# Patient Record
Sex: Male | Born: 1950 | Race: White | Hispanic: No | Marital: Married | Smoking: Former smoker
Health system: Southern US, Community
[De-identification: ages and names within clinical notes are randomized; demographics above are authoritative.]

## PROBLEM LIST (undated history)

## (undated) DIAGNOSIS — I2510 Atherosclerotic heart disease of native coronary artery without angina pectoris: Secondary | ICD-10-CM

## (undated) DIAGNOSIS — E119 Type 2 diabetes mellitus without complications: Secondary | ICD-10-CM

## (undated) DIAGNOSIS — I10 Essential (primary) hypertension: Secondary | ICD-10-CM

## (undated) DIAGNOSIS — E7800 Pure hypercholesterolemia, unspecified: Secondary | ICD-10-CM

## (undated) DIAGNOSIS — K219 Gastro-esophageal reflux disease without esophagitis: Secondary | ICD-10-CM

## (undated) DIAGNOSIS — R091 Pleurisy: Secondary | ICD-10-CM

## (undated) DIAGNOSIS — G4730 Sleep apnea, unspecified: Secondary | ICD-10-CM

## (undated) DIAGNOSIS — J40 Bronchitis, not specified as acute or chronic: Secondary | ICD-10-CM

## (undated) DIAGNOSIS — J189 Pneumonia, unspecified organism: Secondary | ICD-10-CM

## (undated) HISTORY — DX: Atherosclerotic heart disease of native coronary artery without angina pectoris: I25.10

---

## 2002-09-10 ENCOUNTER — Encounter: Payer: Self-pay | Admitting: Family Medicine

## 2002-09-10 ENCOUNTER — Ambulatory Visit (HOSPITAL_COMMUNITY): Admission: RE | Admit: 2002-09-10 | Discharge: 2002-09-10 | Payer: Self-pay | Admitting: Family Medicine

## 2003-06-20 ENCOUNTER — Encounter: Payer: Self-pay | Admitting: Internal Medicine

## 2003-06-20 ENCOUNTER — Inpatient Hospital Stay (HOSPITAL_COMMUNITY): Admission: AD | Admit: 2003-06-20 | Discharge: 2003-06-21 | Payer: Self-pay | Admitting: Cardiology

## 2003-06-21 ENCOUNTER — Encounter: Payer: Self-pay | Admitting: Cardiology

## 2005-01-25 ENCOUNTER — Encounter (HOSPITAL_COMMUNITY): Admission: RE | Admit: 2005-01-25 | Discharge: 2005-02-24 | Payer: Self-pay | Admitting: Preventative Medicine

## 2008-01-08 ENCOUNTER — Emergency Department (HOSPITAL_COMMUNITY): Admission: EM | Admit: 2008-01-08 | Discharge: 2008-01-08 | Payer: Self-pay | Admitting: Emergency Medicine

## 2008-10-05 ENCOUNTER — Ambulatory Visit (HOSPITAL_COMMUNITY): Admission: RE | Admit: 2008-10-05 | Discharge: 2008-10-05 | Payer: Self-pay | Admitting: Gastroenterology

## 2008-10-05 ENCOUNTER — Encounter: Payer: Self-pay | Admitting: Gastroenterology

## 2008-10-05 ENCOUNTER — Ambulatory Visit: Payer: Self-pay | Admitting: Gastroenterology

## 2008-10-07 ENCOUNTER — Encounter: Payer: Self-pay | Admitting: Gastroenterology

## 2011-01-15 NOTE — Op Note (Signed)
Douglas Mason, Douglas Mason             ACCOUNT NO.:  [id number]   MEDICAL RECORD NO.:  [id number]          PATIENT TYPE:  AMB   LOCATION:  DAY                           FACILITY:  [facility]   PHYSICIAN:  Kassie Mends, M.D.      DATE OF BIRTH:  [birth year]   DATE OF PROCEDURE:  10/05/2008  DATE OF DISCHARGE:                                PROCEDURE NOTE   REFERRING [name]:  Dr. Catalina Pizza   PROCEDURE:  Colonoscopy.   INDICATION FOR EXAM:  Douglas Mason is a 60 year old male who presents  for average risk colon cancer screening.   FINDINGS:  1. Slightly tortuous colon requiring pressure to intubate the cecum.      Otherwise, no masses, inflammatory changes, or AVMs seen.  2. Rare sigmoid colon diverticula.  3. Flat adenomatous appearing lesion in the sigmoid colon      approximately 35 cm from the anal verge which was removed via cold      forceps.  A 3-mm sessile rectal polyp removed via cold forceps.  4. Moderate internal hemorrhoids.   RECOMMENDATIONS:  1. Will call Douglas Mason with results of his biopsies.  2. Screening colonoscopy in 5 years if he has flat lesion is an      adenomatous polyp.  3. He should follow high fiber diet.  He is given a handout on high-      fiber diet, polyps, diverticulosis, and hemorrhoids.  4. Aspirin, NSAIDs, or anticoagulation for 5 days.   MEDICATIONS:  1. Demerol 75 mg IV.  2. Versed 4 mg IV.   PROCEDURE TECHNIQUE:  Physical exam was performed.  Informed consent was  obtained from the patient after explaining benefits, risks, and  alternatives to the procedure.  The patient was connected to the monitor  and placed in left lateral position.  Continuous oxygen was provided by  nasal cannula and IV medicine administered through an indwelling  cannula.  After administration of sedation and rectal exam, the  patient's rectum was intubated and the scope was advanced under direct  visualization to the cecum.  The scope was removed slowly by carefully  examining  the color, texture, anatomy, and integrity of the mucosa on the way out.  The patient was recovered in endoscopy and discharged home in  satisfactory condition.   PATH:  Hyperplastic polyp. TCS in 10 years. High fiber diet.      Kassie Mends, M.D.  Electronically Signed     SM/MEDQ  D:  10/05/2008  T:  10/06/2008  Job:  [id number]   cc:   Catalina Pizza, M.D.  Fax: [phone]

## 2011-01-18 NOTE — Procedures (Signed)
   NAME:  Douglas Mason, Douglas Mason                       ACCOUNT NO.:  [id number]   MEDICAL RECORD NO.:  [id number]                   PATIENT TYPE:  OUT   LOCATION:  DFTL                                 FACILITY:  [facility]   PHYSICIAN:  Edward L. Juanetta Gosling, M.D.             DATE OF BIRTH:  Apr 08, 1951   DATE OF PROCEDURE:  DATE OF DISCHARGE:  09/10/2002                              PULMONARY FUNCTION TEST   1. Spirometry is normal.  2. Lung volumes are normal.  3. DLCO is normal.                                                Edward L. Juanetta Gosling, M.D.    ELH/MEDQ  D:  09/13/2002  T:  09/14/2002  Job:  [id number]   cc:   Donna Bernard, M.D.  [address]  [place]  [place] [address]  Fax: [phone]

## 2011-01-18 NOTE — Discharge Summary (Signed)
NAME:  Douglas Mason, Douglas Mason                       ACCOUNT NO.:  [id number]   MEDICAL RECORD NO.:  [id number]                   PATIENT TYPE:  INP   LOCATION:  2035                                 FACILITY:  [facility]   PHYSICIAN:  Willa Rough, M.D.                  DATE OF BIRTH:  1951-05-22   DATE OF ADMISSION:  06/20/2003  DATE OF DISCHARGE:  06/21/2003                           DISCHARGE SUMMARY - REFERRING   DISCHARGE DIAGNOSIS:  Atypical chest pain.  CT negative for pulmonary  embolus. Cardiac enzymes negative.  Echocardiogram normal.   SECONDARY DIAGNOSES:  1. Finding of dyslipidemia this admission on fasting lipid profile.  No past     surgical history.  2. Family history of coronary artery disease.   PROCEDURES:  A 2-D echocardiogram June 21, 2003.  This study showed that  the left ventricular systolic function at the lower limits of normal with  ejection fraction estimated 50 to 55%.  No left ventricular regional wall  motion abnormalities.  Left ventricular wall thickness mildly increased.  Left atrium mildly dilated.  Right ventricular size upper limits of normal.  Aortic valve was trileaflet with no evidence of aortic valve stenosis, no  significant aortic regurgitation, trivial mitral valve regurgitation.  No  pericardial effusion.   DISPOSITION:  Douglas Mason is ready for discharge October 19.  He is  not having any chest pain at the current time.  He has had no chest pain  since his admission, but it was significant prior to admission.  He has been  in sinus rhythm this hospitalization with no evidence of dysrhythmia.  His  blood pressure and heart rate have been stable.  His mental status has been  clear.  Cardiac enzymes are negative as mentioned above.  Computed tomogram  of the chest was negative for pulmonary embolism.  He goes home on the  following medications.   DISCHARGE MEDICATIONS:  1. Enteric-coated aspirin 325 mg daily.  2. Continue  multivitamins and Sinutab.  3. He was given a prescription for Percocet 5/325 one to two tablets every 4     to 6 hours as needed for pain.  4. Nitroglycerin 0.4 mg 1 tablet under the tongue every 5 minutes x 3 doses     as needed for chest pain.   ACTIVITY:  He is to return to work Monday, October 25.  He has been given an  excuse pass to cover him until them.   FOLLOW UP:  He is to follow up with Dr. Gerda Diss should his chest pain recur  or persist.   BRIEF HISTORY:  Douglas Mason is 60 year old male with no prior history of  coronary artery disease.  At 1:30 on the morning of October 18, he awoke  with severe left-sided chest pain.  It was worse with position change,  slightly worse on deep inspiration.  He went to [facility], was  treated with nitroglycerin and morphine with some improvement in  symptoms.  Initial cardiac enzymes at [facility] were negative.  Transferred  to [facility] for further evaluation.   Plan is to complete cardiac enzymes, CT scan to rule out pulmonary embolism,  and echocardiogram to evaluate right ventricular size and follow up on  serial cardiac enzymes.  Enzymes at [facility] x 2 October 18 at  1320 hours: CK 63, CK-MB 1.0, troponin less than 0.01.  The enzymes October  18 at 2055 hours: CK 62, CK-MB 0.9.  Serum electrolytes on October 19:  Sodium 138, potassium 4.2, chloride 105, bicarbonate 26, BUN 11,  creatinine 0.8 glucose 106.  Admission fasting lipid profile: Cholesterol  220, triglycerides 269, HDL cholesterol 32, LDL cholesterol 134.  Once  again, it is suggested that his abnormal lipid profile be treated with  statins or with Niaspan.      Maple Mirza, P.A.                    Willa Rough, M.D.    GM/MEDQ  D:  06/21/2003  T:  06/21/2003  Job:  [id number]   cc:   Dr. Wende Crease [phone])   Donna Bernard, M.D.  [address]  [place]  [place] [address]  Fax: [phone]   Scott A. Gerda Diss,  M.D.  [address]  [place]  [place] [address]  Fax: [phone]

## 2011-01-18 NOTE — H&P (Signed)
NAME:  Douglas Mason, Douglas Mason                      ACCOUNT NO.:  [id number]   MEDICAL RECORD NO.:  [id number]                   PATIENT TYPE:  INP   LOCATION:  2035                                 FACILITY:  [facility]   PHYSICIAN:  Charlies Constable, M.D.                  DATE OF BIRTH:  04/07/1951   DATE OF ADMISSION:  06/20/2003  DATE OF DISCHARGE:                                HISTORY & PHYSICAL   PRIMARY CARE PHYSICIAN:  Scott A. Gerda Diss, M.D.   CHIEF COMPLAINT:  Chest pain.   HISTORY OF PRESENT ILLNESS:  [name] is 60 years old, has no prior  history of known heart disease.  At 1:30 a.m. he woke up with severe left-  sided chest pain which was worse with position and slightly worse with deep  inspiration.  He went to [facility] emergency room and was treated  with nitroglycerin and morphine with some improvement in his symptoms.  He  had initial enzymes there which were negative and he was transferred to [place]  for further evaluation.   PAST MEDICAL HISTORY:  History of shortness of breath, evaluated with  pulmonary function tests, which were normal.  He has no history of diabetes,  hypertension, or hyperlipidemia.  He has no history of prior surgery.   MEDICATIONS PRIOR TO ADMISSION:  Only multivitamins and Sinutabs.   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  He is on his feet most of the day working.   REVIEW OF SYMPTOMS:  This is significant in that there is no history of  recent inactivity or long trips that would predispose to pulmonary embolism.  He has no prior history of thrombophlebitis.   FAMILY HISTORY:  This is positive in that his father died at 19 of cerebral  aneurysm and his mother had a stroke but died at 35 of leukemia.  He does  have a grandfather and grandmother who had history of MI in their 60s and  26s.   For details of social history, family history and review of systems, please  see  Timmie Foerster complete note.   PHYSICAL EXAMINATION:  VITAL SIGNS:  Blood pressure 110/70, pulse 56 and  regular, temperature 97.9.  NECK:  The venous pulsation was visible 1 cm above the clavicle.  Carotid  pulses were full without bruits.  Thyroid not enlarged.  CHEST:  There were questionable minimal rales right over the left lower lung  field at the point of pain.  The lungs were otherwise clear.  There were  otherwise no rales or rhonchi.  CARDIOVASCULAR:  There was a regular rhythm.  The first and second heart  sounds were normal.  There were no murmurs or gallops.  ABDOMEN:  Soft without organomegaly.  The bowel sounds were normal.  EXTREMITIES:  Good pulses.  No peripheral edema and no stasis changes  and no  evidence of phlebitis.  MUSCULOSKELETAL:  No deformities.  NEUROLOGIC:  No focal signs.  SKIN:  Warm and dry.   An ECG showed minor nonspecific STT changes.   Portable chest x-ray showed a small amount of fluid scarring in the fissure  in the right middle lung field.  Lungs were otherwise clear.   CBC was normal.  BMP was normal.   IMPRESSION:  Left-sided chest pain, doubt cardiac.  Rule out pulmonary  embolism.  Rule out pulmonary infection.  Rule out musculoskeletal pain.   RECOMMENDATIONS:  The patient's symptoms do not sound cardiac and suspect  they are either pulmonary or musculoskeletal.  Will obtain a spiral CT scan  to rule out pulmonary embolism and echocardiogram  to evaluate the elevated  neck veins and the right ventricular size and serial enzymes to rule out  cardiac etiology.  Will also get cervical spine films throughout nerve root  pain.                                                Charlies Constable, M.D.    BB/MEDQ  D:  06/20/2003  T:  06/20/2003  Job:  [id number]   cc:   Lorin Picket A. Gerda Diss, M.D.  [address], [address]  [place]  [place] [address]  Fax: [phone]

## 2014-01-27 ENCOUNTER — Encounter (HOSPITAL_COMMUNITY): Payer: Self-pay | Admitting: Emergency Medicine

## 2014-01-27 ENCOUNTER — Emergency Department (HOSPITAL_COMMUNITY): Payer: BC Managed Care – PPO

## 2014-01-27 ENCOUNTER — Emergency Department (HOSPITAL_COMMUNITY)
Admission: EM | Admit: 2014-01-27 | Discharge: 2014-01-27 | Disposition: A | Discharge status: Home / Self Care | Payer: BC Managed Care – PPO | Attending: Emergency Medicine | Admitting: Emergency Medicine

## 2014-01-27 DIAGNOSIS — Z87891 Personal history of nicotine dependence: Secondary | ICD-10-CM | POA: Insufficient documentation

## 2014-01-27 DIAGNOSIS — R059 Cough, unspecified: Secondary | ICD-10-CM | POA: Insufficient documentation

## 2014-01-27 DIAGNOSIS — R0789 Other chest pain: Secondary | ICD-10-CM

## 2014-01-27 DIAGNOSIS — R071 Chest pain on breathing: Secondary | ICD-10-CM | POA: Insufficient documentation

## 2014-01-27 DIAGNOSIS — R51 Headache: Secondary | ICD-10-CM | POA: Insufficient documentation

## 2014-01-27 DIAGNOSIS — E119 Type 2 diabetes mellitus without complications: Secondary | ICD-10-CM | POA: Insufficient documentation

## 2014-01-27 DIAGNOSIS — R05 Cough: Secondary | ICD-10-CM | POA: Insufficient documentation

## 2014-01-27 DIAGNOSIS — K219 Gastro-esophageal reflux disease without esophagitis: Secondary | ICD-10-CM | POA: Insufficient documentation

## 2014-01-27 DIAGNOSIS — R61 Generalized hyperhidrosis: Secondary | ICD-10-CM | POA: Insufficient documentation

## 2014-01-27 DIAGNOSIS — R0602 Shortness of breath: Secondary | ICD-10-CM | POA: Insufficient documentation

## 2014-01-27 DIAGNOSIS — I10 Essential (primary) hypertension: Secondary | ICD-10-CM | POA: Insufficient documentation

## 2014-01-27 HISTORY — DX: Essential (primary) hypertension: I10

## 2014-01-27 HISTORY — DX: Type 2 diabetes mellitus without complications: E11.9

## 2014-01-27 HISTORY — DX: Gastro-esophageal reflux disease without esophagitis: K21.9

## 2014-01-27 LAB — CBC WITH DIFFERENTIAL/PLATELET
BASOS PCT: 0 % (ref 0–1)
Basophils Absolute: 0 10*3/uL (ref 0.0–0.1)
EOS PCT: 1 % (ref 0–5)
Eosinophils Absolute: 0.1 10*3/uL (ref 0.0–0.7)
HCT: 37.4 % — ABNORMAL LOW (ref 39.0–52.0)
Hemoglobin: 12.3 g/dL — ABNORMAL LOW (ref 13.0–17.0)
LYMPHS ABS: 1.2 10*3/uL (ref 0.7–4.0)
Lymphocytes Relative: 11 % — ABNORMAL LOW (ref 12–46)
MCH: 30.3 pg (ref 26.0–34.0)
MCHC: 32.9 g/dL (ref 30.0–36.0)
MCV: 92.1 fL (ref 78.0–100.0)
Monocytes Absolute: 0.9 10*3/uL (ref 0.1–1.0)
Monocytes Relative: 8 % (ref 3–12)
Neutro Abs: 8.8 10*3/uL — ABNORMAL HIGH (ref 1.7–7.7)
Neutrophils Relative %: 80 % — ABNORMAL HIGH (ref 43–77)
PLATELETS: 401 10*3/uL — AB (ref 150–400)
RBC: 4.06 MIL/uL — AB (ref 4.22–5.81)
RDW: 14.8 % (ref 11.5–15.5)
WBC: 11 10*3/uL — ABNORMAL HIGH (ref 4.0–10.5)

## 2014-01-27 LAB — BASIC METABOLIC PANEL
BUN: 18 mg/dL (ref 6–23)
CALCIUM: 9.9 mg/dL (ref 8.4–10.5)
CO2: 27 mEq/L (ref 19–32)
Chloride: 100 mEq/L (ref 96–112)
Creatinine, Ser: 0.99 mg/dL (ref 0.50–1.35)
GFR, EST NON AFRICAN AMERICAN: 85 mL/min — AB (ref >90)
GLUCOSE: 116 mg/dL — AB (ref 70–99)
Potassium: 5.1 mEq/L (ref 3.7–5.3)
Sodium: 140 mEq/L (ref 137–147)

## 2014-01-27 LAB — D-DIMER, QUANTITATIVE: D-Dimer, Quant: 2.63 ug/mL-FEU — ABNORMAL HIGH (ref 0.00–0.48)

## 2014-01-27 LAB — TROPONIN I
Troponin I: <0.3 ng/mL (ref <0.30)
Troponin I: <0.3 ng/mL (ref <0.30)

## 2014-01-27 LAB — CBG MONITORING, ED: Glucose-Capillary: 101 mg/dL — ABNORMAL HIGH (ref 70–99)

## 2014-01-27 LAB — PRO B NATRIURETIC PEPTIDE: Pro B Natriuretic peptide (BNP): 61.5 pg/mL (ref 0–125)

## 2014-01-27 MED ORDER — TRAMADOL HCL 50 MG PO TABS: 50.0000 mg | ORAL_TABLET | Freq: Four times a day (QID) | ORAL | Status: DC | PRN

## 2014-01-27 MED ORDER — IOHEXOL 350 MG/ML SOLN
100.0000 mL | Freq: Once | INTRAVENOUS | Status: AC | PRN
  Administered 2014-01-27: 100 mL via INTRAVENOUS

## 2014-01-27 MED ORDER — HYDROCODONE-ACETAMINOPHEN 5-325 MG PO TABS: 1.0000 | ORAL_TABLET | Freq: Four times a day (QID) | ORAL | Status: DC | PRN

## 2014-01-27 MED ORDER — ONDANSETRON HCL 4 MG/2ML IJ SOLN
4.0000 mg | Freq: Once | INTRAMUSCULAR | Status: AC
  Administered 2014-01-27: 4 mg via INTRAVENOUS
  Filled 2014-01-27: qty 2

## 2014-01-27 MED ORDER — SODIUM CHLORIDE 0.9 % IV BOLUS (SEPSIS)
250.0000 mL | Freq: Once | INTRAVENOUS | Status: AC
  Administered 2014-01-27: 250 mL via INTRAVENOUS

## 2014-01-27 MED ORDER — NITROGLYCERIN 0.4 MG SL SUBL
0.4000 mg | SUBLINGUAL_TABLET | SUBLINGUAL | Status: DC | PRN
  Administered 2014-01-27: 0.4 mg via SUBLINGUAL
  Filled 2014-01-27: qty 1

## 2014-01-27 MED ORDER — ASPIRIN 81 MG PO CHEW
324.0000 mg | CHEWABLE_TABLET | Freq: Once | ORAL | Status: AC
  Administered 2014-01-27: 324 mg via ORAL
  Filled 2014-01-27: qty 4

## 2014-01-27 MED ORDER — MORPHINE SULFATE 2 MG/ML IJ SOLN
2.0000 mg | Freq: Once | INTRAMUSCULAR | Status: AC
  Administered 2014-01-27: 2 mg via INTRAVENOUS
  Filled 2014-01-27: qty 1

## 2014-01-27 MED ORDER — SODIUM CHLORIDE 0.9 % IV SOLN: INTRAVENOUS | Status: DC

## 2014-01-27 NOTE — ED Provider Notes (Signed)
CSN: [id number]     Arrival date & time 01/27/14  9371 History  This chart was scribed for Fredia Sorrow, MD by Zettie Pho, ED Scribe. This patient was seen in room APA01/APA01 and the patient's care was started at 8:54 AM.    Chief Complaint  Patient presents with  . Chest Pain   Patient is a 63 y.o. male presenting with chest pain. The history is provided by the patient. No language interpreter was used.  Chest Pain Pain location:  L chest Pain quality: sharp   Pain radiates to:  L shoulder and upper back Pain radiates to the back: yes   Pain severity:  Severe Onset quality:  Sudden Timing:  Constant Progression:  Unchanged Chronicity:  New Context: at rest   Relieved by:  Rest Worsened by:  Deep breathing and movement Associated symptoms: cough, diaphoresis, headache, lower extremity edema and shortness of breath   Associated symptoms: no abdominal pain, no back pain, no fever, no nausea and not vomiting   Cough:    Cough characteristics:  Dry   Severity:  Mild   Onset quality:  Gradual   Timing:  Constant   Progression:  Unchanged   Chronicity:  Chronic Headaches:    Severity:  Mild   Onset quality:  Gradual   Timing:  Constant   Progression:  Unchanged   Chronicity:  New Shortness of breath:    Severity:  Moderate   Onset quality:  Sudden   Timing:  Constant   Progression:  Waxing and waning Risk factors: diabetes mellitus, hypertension and male sex    HPI Comments: Douglas Mason is a 63 y.o. male with a history of HTN, GERD, and DM who presents to the Emergency Department complaining of a constant, sharp pain to the left side of the chest that radiates to the left posterior shoulder onset this morning around 2:00 AM, or about 7 hours ago. He states that the pain is alleviated to a 3/10 with rest, but exacerbated to an 8/10 with movement and deep breathing. He reports associated shortness of breath, diaphoresis, slightly increased leg swelling, and a generalized  headache. Patient states that these types of symptoms are new for him. Patient reports a dry cough that he states is normal for him and denies any recent changes. Patient takes 81 mg aspirin daily, but denies other anticoagulant medication use. He reports that he does not currently have a cardiologist. He denies nausea, vomiting, fever, chills, visual disturbance, rhinorrhea, sore throat, abdominal pain, diarrhea, dysuria.   Past Medical History  Diagnosis Date  . Hypertension   . Diabetes mellitus without complication   . GERD (gastroesophageal reflux disease)    History reviewed. No pertinent past surgical history. No family history on file. History  Substance Use Topics  . Smoking status: Former Research scientist (life sciences)  . Smokeless tobacco: Not on file  . Alcohol Use: No    Review of Systems  Constitutional: Positive for diaphoresis. Negative for fever and chills.  HENT: Negative for rhinorrhea and sore throat.   Eyes: Negative for visual disturbance.  Respiratory: Positive for cough and shortness of breath.   Cardiovascular: Positive for chest pain.  Gastrointestinal: Negative for nausea, vomiting, abdominal pain and diarrhea.  Genitourinary: Negative for dysuria and hematuria.  Musculoskeletal: Negative for back pain and neck pain.  Skin: Negative for rash.  Neurological: Positive for headaches.  Hematological: Does not bruise/bleed easily.  Psychiatric/Behavioral: Negative for confusion.      Allergies  Review of patient's  allergies indicates no known allergies.  Home Medications   Prior to Admission medications   Not on File   Triage Vitals: BP 126/66  Pulse 67  Temp(Src) 98.2 F (36.8 C) (Oral)  Resp 20  Ht 6' (1.829 m)  Wt 230 lb (104.327 kg)  BMI 31.19 kg/m2  SpO2 100%  Physical Exam  Nursing note and vitals reviewed. Constitutional: He is oriented to person, place, and time. He appears well-developed and well-nourished. No distress.  HENT:  Head: Normocephalic and  atraumatic.  Eyes: Conjunctivae and EOM are normal. Pupils are equal, round, and reactive to light.  Neck: Normal range of motion. Neck supple.  Cardiovascular: Normal rate, regular rhythm and normal heart sounds.   Pulmonary/Chest: Effort normal and breath sounds normal. No respiratory distress.  Abdominal: Soft. Bowel sounds are normal. He exhibits no distension. There is no tenderness.  Musculoskeletal: Normal range of motion. He exhibits no edema.  Neurological: He is alert and oriented to person, place, and time. No cranial nerve deficit. He exhibits normal muscle tone. Coordination normal.  Skin: Skin is warm and dry.  Psychiatric: He has a normal mood and affect. His behavior is normal.    ED Course  Procedures (including critical care time)  DIAGNOSTIC STUDIES: Oxygen Saturation is 100% on room air, normal by my interpretation.    COORDINATION OF CARE: 9:01 AM- Ordered EKG, CBC, BMP, troponin, D-dimer, BNP, and a chest x-ray. Ordered IV fluids, Zofran, aspirin, morphine, and nitroglycerin to manage symptoms. Discussed treatment plan with patient at bedside and patient verbalized agreement.   Medications  0.9 %  sodium chloride infusion (not administered)  nitroGLYCERIN (NITROSTAT) SL tablet 0.4 mg (0.4 mg Sublingual Given 01/27/14 1011)  sodium chloride 0.9 % bolus 250 mL (0 mLs Intravenous Stopped 01/27/14 1011)  ondansetron (ZOFRAN) injection 4 mg (4 mg Intravenous Given 01/27/14 0947)  aspirin chewable tablet 324 mg (324 mg Oral Given 01/27/14 0945)  morphine 2 MG/ML injection 2 mg (2 mg Intravenous Given 01/27/14 0948)  iohexol (OMNIPAQUE) 350 MG/ML injection 100 mL (100 mLs Intravenous Contrast Given 01/27/14 1112)   Results for orders placed during the hospital encounter of 01/27/14  CBC WITH DIFFERENTIAL      Result Value Ref Range   WBC 11.0 (*) 4.0 - 10.5 K/uL   RBC 4.06 (*) 4.22 - 5.81 MIL/uL   Hemoglobin 12.3 (*) 13.0 - 17.0 g/dL   HCT 97.5 (*) 30.0 - 51.1 %   MCV  92.1  78.0 - 100.0 fL   MCH 30.3  26.0 - 34.0 pg   MCHC 32.9  30.0 - 36.0 g/dL   RDW 02.1  11.7 - 35.6 %   Platelets 401 (*) 150 - 400 K/uL   Neutrophils Relative % 80 (*) 43 - 77 %   Neutro Abs 8.8 (*) 1.7 - 7.7 K/uL   Lymphocytes Relative 11 (*) 12 - 46 %   Lymphs Abs 1.2  0.7 - 4.0 K/uL   Monocytes Relative 8  3 - 12 %   Monocytes Absolute 0.9  0.1 - 1.0 K/uL   Eosinophils Relative 1  0 - 5 %   Eosinophils Absolute 0.1  0.0 - 0.7 K/uL   Basophils Relative 0  0 - 1 %   Basophils Absolute 0.0  0.0 - 0.1 K/uL  BASIC METABOLIC PANEL      Result Value Ref Range   Sodium 140  137 - 147 mEq/L   Potassium 5.1  3.7 - 5.3 mEq/L  Chloride 100  96 - 112 mEq/L   CO2 27  19 - 32 mEq/L   Glucose, Bld 116 (*) 70 - 99 mg/dL   BUN 18  6 - 23 mg/dL   Creatinine, Ser 0.99  0.50 - 1.35 mg/dL   Calcium 9.9  8.4 - 10.5 mg/dL   GFR calc non Af Amer 85 (*) >90 mL/min   GFR calc Af Amer >90  >90 mL/min  TROPONIN I      Result Value Ref Range   Troponin I <0.30  <0.30 ng/mL  PRO B NATRIURETIC PEPTIDE      Result Value Ref Range   Pro B Natriuretic peptide (BNP) 61.5  0 - 125 pg/mL  D-DIMER, QUANTITATIVE      Result Value Ref Range   D-Dimer, Quant 2.63 (*) 0.00 - 0.48 ug/mL-FEU  TROPONIN I      Result Value Ref Range   Troponin I <0.30  <0.30 ng/mL  CBG MONITORING, ED      Result Value Ref Range   Glucose-Capillary 101 (*) 70 - 99 mg/dL   Dg Chest Port 1 View  01/27/2014   CLINICAL DATA:  Chest pain.  EXAM: PORTABLE CHEST - 1 VIEW  COMPARISON:  01/08/2008  FINDINGS: Lung volumes are low bilaterally with bibasilar atelectasis present. The heart size is at the upper limits of normal/mildly enlarged. No overt edema, airspace consolidation, pleural fluid or pneumothorax is identified.  IMPRESSION: Bibasilar atelectasis with low bilateral lung volumes.   Electronically Signed   By: Aletta Edouard M.D.   On: 01/27/2014 09:22   Ct Angio Chest Pe W/cm &/or Wo Cm  01/27/2014   CLINICAL DATA:   Left-sided chest pain  EXAM: CT ANGIOGRAPHY CHEST WITH CONTRAST  TECHNIQUE: Multidetector CT imaging of the chest was performed using the standard protocol during bolus administration of intravenous contrast. Multiplanar CT image reconstructions and MIPs were obtained to evaluate the vascular anatomy.  CONTRAST:  171mL OMNIPAQUE IOHEXOL 350 MG/ML SOLN  COMPARISON:  None.  FINDINGS: No filling defects within the pulmonary arteries to suggest acute pulmonary embolism. No acute findings of the aorta or great vessels. Trace pericardial effusion is noted. Coronary artery calcifications noted.  Review of the lung parenchyma demonstrates mild basilar atelectasis. No pneumothorax or infiltrate  There is no axillary or supraclavicular adenopathy. No mediastinal adenopathy. Esophagus is normal.  Review of the upper abdomen is unremarkable. Limited view of the skeleton demonstrates degenerative spurring of the spine.  Review of the MIP images confirms the above findings.  IMPRESSION: 1. No acute pulmonary embolism. 2. Trace pericardial effusion. 3. Mild coronary calcification.   Electronically Signed   By: Suzy Bouchard M.D.   On: 01/27/2014 11:38        EKG Interpretation   Date/Time:  Thursday Jan 27 2014 08:47:37 EDT Ventricular Rate:  72 PR Interval:  155 QRS Duration: 87 QT Interval:  364 QTC Calculation: 398 R Axis:   12 Text Interpretation:  Sinus rhythm No significant change since last  tracing Confirmed by Jonise Weightman  MD, Ahmod Gillespie ([id number]) on 01/27/2014 9:13:59 AM      MDM   Final diagnoses:  Chest wall pain   Patient with extensive workup for pleuritic or chest wall type pain. Negative for pulmonary embolism negative for pneumothorax negative for pneumonia negative for acute cardiac event. Suspect this is just chest wall pain we'll treat with pain medicines. Have him followup with his regular Dr. Renee Rival EKGs without any acute changes troponins negative x2.  Patient's onset of pain was at 2 in  the morning. This was an acute cardiac event or unstable angina troponin should be positive by now.  I personally performed the services described in this documentation, which was scribed in my presence. The recorded information has been reviewed and is accurate.      Fredia Sorrow, MD 01/27/14 1352

## 2014-01-27 NOTE — ED Notes (Signed)
Complain of chest pain. Hurts more with movement

## 2014-01-27 NOTE — Discharge Instructions (Signed)
Chest Wall Pain Chest wall pain is pain felt in or around the chest bones and muscles. It may take up to 6 weeks to get better. It may take longer if you are active. Chest wall pain can happen on its own. Other times, things like germs, injury, coughing, or exercise can cause the pain. HOME CARE   Avoid activities that make you tired or cause pain. Try not to use your chest, belly (abdominal), or side muscles. Do not use heavy weights.  Put ice on the sore area.  Put ice in a plastic bag.  Place a towel between your skin and the bag.  Leave the ice on for 15-20 minutes for the first 2 days.  Only take medicine as told by your doctor. GET HELP RIGHT AWAY IF:   You have more pain or are very uncomfortable.  You have a fever.  Your chest pain gets worse.  You have new problems.  You feel sick to your stomach (nauseous) or throw up (vomit).  You start to sweat or feel lightheaded.  You have a cough with mucus (phlegm).  You cough up blood. MAKE SURE YOU:   Understand these instructions.  Will watch your condition.  Will get help right away if you are not doing well or get worse. Document Released: 02/05/2008 Document Revised: 11/11/2011 Document Reviewed: 04/15/2011 [facility] Patient Information 2014 [place], [place].  Extensive workup here today without any dangerous chest pain findings. No evidence of any heart problem. No evidence of pneumonia or collapsed lung. No evidence of any blood clots in the lungs. Suspect that this is chest wall pain. Take pain medications as directed. Take the tramadol on a regular basis can supplement with hydrocodone for more severe pain. Make an appointment to followup with your doctor if not improved. Return for any new worse symptoms. Work note provided.

## 2014-01-27 NOTE — ED Notes (Signed)
Pt's BP dropped from 109/65 down to 98/62 after one Nitroglycerin tablet. Dr Rogene Houston notified. Second and third doses not given.

## 2014-03-13 ENCOUNTER — Encounter (HOSPITAL_COMMUNITY): Payer: Self-pay | Admitting: Emergency Medicine

## 2014-03-13 ENCOUNTER — Observation Stay (HOSPITAL_COMMUNITY): Payer: BC Managed Care – PPO

## 2014-03-13 ENCOUNTER — Inpatient Hospital Stay (HOSPITAL_COMMUNITY)
Admission: EM | Admit: 2014-03-13 | Discharge: 2014-03-17 | DRG: 314 | Disposition: A | Discharge status: Home / Self Care | Payer: BC Managed Care – PPO | Attending: Oncology | Admitting: Oncology

## 2014-03-13 ENCOUNTER — Emergency Department (HOSPITAL_COMMUNITY): Payer: BC Managed Care – PPO

## 2014-03-13 DIAGNOSIS — I300 Acute nonspecific idiopathic pericarditis: Principal | ICD-10-CM | POA: Diagnosis present

## 2014-03-13 DIAGNOSIS — I3139 Other pericardial effusion (noninflammatory): Secondary | ICD-10-CM | POA: Diagnosis present

## 2014-03-13 DIAGNOSIS — I313 Pericardial effusion (noninflammatory): Secondary | ICD-10-CM | POA: Diagnosis present

## 2014-03-13 DIAGNOSIS — E785 Hyperlipidemia, unspecified: Secondary | ICD-10-CM | POA: Diagnosis present

## 2014-03-13 DIAGNOSIS — Z79899 Other long term (current) drug therapy: Secondary | ICD-10-CM

## 2014-03-13 DIAGNOSIS — J90 Pleural effusion, not elsewhere classified: Secondary | ICD-10-CM | POA: Diagnosis present

## 2014-03-13 DIAGNOSIS — J189 Pneumonia, unspecified organism: Secondary | ICD-10-CM | POA: Diagnosis present

## 2014-03-13 DIAGNOSIS — D72821 Monocytosis (symptomatic): Secondary | ICD-10-CM | POA: Diagnosis present

## 2014-03-13 DIAGNOSIS — D649 Anemia, unspecified: Secondary | ICD-10-CM | POA: Diagnosis present

## 2014-03-13 DIAGNOSIS — Z87891 Personal history of nicotine dependence: Secondary | ICD-10-CM

## 2014-03-13 DIAGNOSIS — J129 Viral pneumonia, unspecified: Secondary | ICD-10-CM | POA: Diagnosis present

## 2014-03-13 DIAGNOSIS — Z7982 Long term (current) use of aspirin: Secondary | ICD-10-CM

## 2014-03-13 DIAGNOSIS — R05 Cough: Secondary | ICD-10-CM

## 2014-03-13 DIAGNOSIS — E119 Type 2 diabetes mellitus without complications: Secondary | ICD-10-CM | POA: Diagnosis present

## 2014-03-13 DIAGNOSIS — J9819 Other pulmonary collapse: Secondary | ICD-10-CM | POA: Diagnosis present

## 2014-03-13 DIAGNOSIS — R059 Cough, unspecified: Secondary | ICD-10-CM

## 2014-03-13 DIAGNOSIS — I10 Essential (primary) hypertension: Secondary | ICD-10-CM | POA: Diagnosis present

## 2014-03-13 DIAGNOSIS — I319 Disease of pericardium, unspecified: Secondary | ICD-10-CM | POA: Diagnosis present

## 2014-03-13 DIAGNOSIS — J209 Acute bronchitis, unspecified: Secondary | ICD-10-CM | POA: Diagnosis present

## 2014-03-13 DIAGNOSIS — I4892 Unspecified atrial flutter: Secondary | ICD-10-CM | POA: Diagnosis not present

## 2014-03-13 DIAGNOSIS — R058 Other specified cough: Secondary | ICD-10-CM

## 2014-03-13 DIAGNOSIS — R079 Chest pain, unspecified: Secondary | ICD-10-CM | POA: Diagnosis present

## 2014-03-13 DIAGNOSIS — K219 Gastro-esophageal reflux disease without esophagitis: Secondary | ICD-10-CM | POA: Diagnosis present

## 2014-03-13 DIAGNOSIS — I498 Other specified cardiac arrhythmias: Secondary | ICD-10-CM | POA: Diagnosis present

## 2014-03-13 DIAGNOSIS — N390 Urinary tract infection, site not specified: Secondary | ICD-10-CM | POA: Diagnosis present

## 2014-03-13 LAB — COMPREHENSIVE METABOLIC PANEL
ALT: 47 U/L (ref 0–53)
AST: 30 U/L (ref 0–37)
Albumin: 3.7 g/dL (ref 3.5–5.2)
Alkaline Phosphatase: 73 U/L (ref 39–117)
Anion gap: 16 — ABNORMAL HIGH (ref 5–15)
BUN: 17 mg/dL (ref 6–23)
CALCIUM: 8.9 mg/dL (ref 8.4–10.5)
CO2: 24 mEq/L (ref 19–32)
CREATININE: 0.99 mg/dL (ref 0.50–1.35)
Chloride: 97 mEq/L (ref 96–112)
GFR calc Af Amer: >90 mL/min (ref >90)
GFR, EST NON AFRICAN AMERICAN: 85 mL/min — AB (ref >90)
GLUCOSE: 162 mg/dL — AB (ref 70–99)
Potassium: 4.2 mEq/L (ref 3.7–5.3)
Sodium: 137 mEq/L (ref 137–147)
Total Bilirubin: 1.3 mg/dL — ABNORMAL HIGH (ref 0.3–1.2)
Total Protein: 7.7 g/dL (ref 6.0–8.3)

## 2014-03-13 LAB — CBC WITH DIFFERENTIAL/PLATELET
Basophils Absolute: 0 10*3/uL (ref 0.0–0.1)
Basophils Relative: 0 % (ref 0–1)
EOS PCT: 1 % (ref 0–5)
Eosinophils Absolute: 0.2 10*3/uL (ref 0.0–0.7)
HEMATOCRIT: 35.5 % — AB (ref 39.0–52.0)
Hemoglobin: 11.8 g/dL — ABNORMAL LOW (ref 13.0–17.0)
LYMPHS ABS: 2.1 10*3/uL (ref 0.7–4.0)
LYMPHS PCT: 14 % (ref 12–46)
MCH: 30.5 pg (ref 26.0–34.0)
MCHC: 33.2 g/dL (ref 30.0–36.0)
MCV: 91.7 fL (ref 78.0–100.0)
MONO ABS: 2 10*3/uL — AB (ref 0.1–1.0)
Monocytes Relative: 13 % — ABNORMAL HIGH (ref 3–12)
Neutro Abs: 10.7 10*3/uL — ABNORMAL HIGH (ref 1.7–7.7)
Neutrophils Relative %: 72 % (ref 43–77)
Platelets: 298 10*3/uL (ref 150–400)
RBC: 3.87 MIL/uL — AB (ref 4.22–5.81)
RDW: 15.3 % (ref 11.5–15.5)
WBC: 15 10*3/uL — AB (ref 4.0–10.5)

## 2014-03-13 LAB — URINALYSIS, ROUTINE W REFLEX MICROSCOPIC
GLUCOSE, UA: NEGATIVE mg/dL
HGB URINE DIPSTICK: NEGATIVE
Ketones, ur: 15 mg/dL — AB
Nitrite: POSITIVE — AB
Protein, ur: 100 mg/dL — AB
Specific Gravity, Urine: 1.031 — ABNORMAL HIGH (ref 1.005–1.030)
UROBILINOGEN UA: 4 mg/dL — AB (ref 0.0–1.0)
pH: 5 (ref 5.0–8.0)

## 2014-03-13 LAB — RAPID URINE DRUG SCREEN, HOSP PERFORMED
AMPHETAMINES: NOT DETECTED
BARBITURATES: NOT DETECTED
Benzodiazepines: NOT DETECTED
COCAINE: NOT DETECTED
OPIATES: POSITIVE — AB
TETRAHYDROCANNABINOL: NOT DETECTED

## 2014-03-13 LAB — TROPONIN I
Troponin I: <0.3 ng/mL (ref <0.30)
Troponin I: <0.3 ng/mL (ref <0.30)
Troponin I: <0.3 ng/mL (ref <0.30)

## 2014-03-13 LAB — HEMOGLOBIN A1C
HEMOGLOBIN A1C: 5.5 % (ref <5.7)
Mean Plasma Glucose: 111 mg/dL (ref <117)

## 2014-03-13 LAB — STREP PNEUMONIAE URINARY ANTIGEN: Strep Pneumo Urinary Antigen: NEGATIVE

## 2014-03-13 LAB — URINE MICROSCOPIC-ADD ON

## 2014-03-13 LAB — LIPID PANEL
CHOLESTEROL: 122 mg/dL (ref 0–200)
HDL: 34 mg/dL — AB (ref >39)
LDL CALC: 74 mg/dL (ref 0–99)
TRIGLYCERIDES: 68 mg/dL (ref <150)
Total CHOL/HDL Ratio: 3.6 RATIO
VLDL: 14 mg/dL (ref 0–40)

## 2014-03-13 LAB — SEDIMENTATION RATE: SED RATE: 66 mm/h — AB (ref 0–16)

## 2014-03-13 LAB — GLUCOSE, CAPILLARY
Glucose-Capillary: 106 mg/dL — ABNORMAL HIGH (ref 70–99)
Glucose-Capillary: 161 mg/dL — ABNORMAL HIGH (ref 70–99)

## 2014-03-13 LAB — PRO B NATRIURETIC PEPTIDE: PRO B NATRI PEPTIDE: 190.3 pg/mL — AB (ref 0–125)

## 2014-03-13 LAB — TSH: TSH: 1.33 u[IU]/mL (ref 0.350–4.500)

## 2014-03-13 LAB — LIPASE, BLOOD: Lipase: 29 U/L (ref 11–59)

## 2014-03-13 MED ORDER — HEPARIN SODIUM (PORCINE) 5000 UNIT/ML IJ SOLN
5000.0000 [IU] | Freq: Three times a day (TID) | INTRAMUSCULAR | Status: DC
  Administered 2014-03-13 – 2014-03-17 (×10): 5000 [IU] via SUBCUTANEOUS
  Filled 2014-03-13 (×15): qty 1

## 2014-03-13 MED ORDER — ONDANSETRON HCL 4 MG/2ML IJ SOLN
4.0000 mg | Freq: Once | INTRAMUSCULAR | Status: AC
  Administered 2014-03-13: 4 mg via INTRAVENOUS
  Filled 2014-03-13: qty 2

## 2014-03-13 MED ORDER — ONDANSETRON HCL 4 MG/2ML IJ SOLN: 4.0000 mg | Freq: Four times a day (QID) | INTRAMUSCULAR | Status: DC | PRN

## 2014-03-13 MED ORDER — MORPHINE SULFATE 2 MG/ML IJ SOLN
2.0000 mg | INTRAMUSCULAR | Status: DC | PRN
  Administered 2014-03-13 – 2014-03-14 (×3): 2 mg via INTRAVENOUS
  Filled 2014-03-13 (×3): qty 1

## 2014-03-13 MED ORDER — HYDROCODONE-ACETAMINOPHEN 5-325 MG PO TABS
1.0000 | ORAL_TABLET | Freq: Four times a day (QID) | ORAL | Status: DC | PRN
  Administered 2014-03-13: 1 via ORAL
  Administered 2014-03-14 – 2014-03-16 (×5): 2 via ORAL
  Filled 2014-03-13 (×2): qty 2
  Filled 2014-03-13: qty 1
  Filled 2014-03-13 (×3): qty 2

## 2014-03-13 MED ORDER — GI COCKTAIL ~~LOC~~
30.0000 mL | Freq: Four times a day (QID) | ORAL | Status: DC | PRN
Start: 2014-03-13 — End: 2014-03-17
  Administered 2014-03-13: 30 mL via ORAL
  Filled 2014-03-13: qty 30

## 2014-03-13 MED ORDER — DEXTROSE 5 % IV SOLN
500.0000 mg | INTRAVENOUS | Status: DC
  Administered 2014-03-13 – 2014-03-15 (×3): 500 mg via INTRAVENOUS
  Filled 2014-03-13 (×5): qty 500

## 2014-03-13 MED ORDER — METOPROLOL TARTRATE 25 MG PO TABS
25.0000 mg | ORAL_TABLET | Freq: Two times a day (BID) | ORAL | Status: DC
  Administered 2014-03-13 – 2014-03-17 (×9): 25 mg via ORAL
  Filled 2014-03-13 (×10): qty 1

## 2014-03-13 MED ORDER — PANTOPRAZOLE SODIUM 40 MG PO TBEC
40.0000 mg | DELAYED_RELEASE_TABLET | Freq: Every day | ORAL | Status: DC
  Administered 2014-03-13 – 2014-03-16 (×4): 40 mg via ORAL
  Filled 2014-03-13 (×3): qty 1

## 2014-03-13 MED ORDER — INSULIN ASPART 100 UNIT/ML ~~LOC~~ SOLN
0.0000 [IU] | Freq: Three times a day (TID) | SUBCUTANEOUS | Status: DC
  Administered 2014-03-14: 3 [IU] via SUBCUTANEOUS
  Administered 2014-03-14: 2 [IU] via SUBCUTANEOUS
  Administered 2014-03-15: 3 [IU] via SUBCUTANEOUS
  Administered 2014-03-16: 5 [IU] via SUBCUTANEOUS
  Administered 2014-03-17: 2 [IU] via SUBCUTANEOUS

## 2014-03-13 MED ORDER — GUAIFENESIN-CODEINE 100-10 MG/5ML PO SOLN
10.0000 mL | Freq: Four times a day (QID) | ORAL | Status: DC | PRN
  Administered 2014-03-13 – 2014-03-14 (×3): 10 mL via ORAL
  Filled 2014-03-13 (×4): qty 10

## 2014-03-13 MED ORDER — ASPIRIN 81 MG PO CHEW
324.0000 mg | CHEWABLE_TABLET | Freq: Once | ORAL | Status: AC
  Administered 2014-03-13: 243 mg via ORAL
  Filled 2014-03-13: qty 4

## 2014-03-13 MED ORDER — LISINOPRIL 5 MG PO TABS
5.0000 mg | ORAL_TABLET | Freq: Every day | ORAL | Status: DC
  Administered 2014-03-13 – 2014-03-16 (×4): 5 mg via ORAL
  Filled 2014-03-13 (×4): qty 1

## 2014-03-13 MED ORDER — SIMVASTATIN 80 MG PO TABS
80.0000 mg | ORAL_TABLET | Freq: Every day | ORAL | Status: DC
  Administered 2014-03-13 – 2014-03-16 (×4): 80 mg via ORAL
  Filled 2014-03-13 (×5): qty 1

## 2014-03-13 MED ORDER — SODIUM CHLORIDE 0.9 % IV BOLUS (SEPSIS)
1000.0000 mL | Freq: Once | INTRAVENOUS | Status: AC
  Administered 2014-03-13: 1000 mL via INTRAVENOUS

## 2014-03-13 MED ORDER — ACETAMINOPHEN 325 MG PO TABS
650.0000 mg | ORAL_TABLET | ORAL | Status: DC | PRN
  Administered 2014-03-13 – 2014-03-15 (×2): 650 mg via ORAL
  Filled 2014-03-13 (×2): qty 2

## 2014-03-13 MED ORDER — DEXTROSE 5 % IV SOLN
1.0000 g | INTRAVENOUS | Status: DC
  Administered 2014-03-13 – 2014-03-15 (×3): 1 g via INTRAVENOUS
  Filled 2014-03-13 (×4): qty 10

## 2014-03-13 NOTE — ED Notes (Signed)
Pt taken to room 19, in a gown and placed on a monitor VS and EKG done.

## 2014-03-13 NOTE — ED Provider Notes (Signed)
CSN: [id number]     Arrival date & time 03/13/14  5397 History   First MD Initiated Contact with Patient 03/13/14 [id number]     Chief Complaint  Patient presents with  . Chest Pain  . Nausea     (Consider location/radiation/quality/duration/timing/severity/associated sxs/prior Treatment) The history is provided by the patient. No language interpreter was used.  Douglas Mason is a 63 y/o M with PMHx of DM, HTN, GERD presenting to the ED with chest pain and shortness of breath that has been ongoing for the past couple of weeks - worsening starting yesterday. Patient reported that he has been having chest pain localized to the left side of the chest described as a sharp pain, knife stabbing pain with coughing. Patient reported that when he is resting he has a constant aching sensation. Patient reported that when he lays down flat and when active he gets shortness of breath. Reported that the pain does radiate down the left arm. Patient reported that he was seen and assessed in May 2015 for similar complaints, but was discharged home. Patient reported that when he gets these episodes of pain he does get sweating and diaphoretic. Stated that he gets nauseous with intermittent pain. Denied abdominal pain, vomiting, fever, chills, weakness, numbness, tingling, syncope. PCP Dr. [name]   Past Medical History  Diagnosis Date  . Hypertension   . Diabetes mellitus without complication   . GERD (gastroesophageal reflux disease)    History reviewed. No pertinent past surgical history. No family history on file. History  Substance Use Topics  . Smoking status: Former Research scientist (life sciences)  . Smokeless tobacco: Not on file  . Alcohol Use: No    Review of Systems  Constitutional: Positive for diaphoresis. Negative for chills.  Respiratory: Positive for shortness of breath. Negative for chest tightness.   Cardiovascular: Positive for chest pain.  Gastrointestinal: Positive for nausea. Negative for vomiting, abdominal  pain, diarrhea and constipation.  Musculoskeletal: Negative for back pain and neck pain.  Neurological: Negative for weakness and numbness.      Allergies  Review of patient's allergies indicates no known allergies.  Home Medications   Prior to Admission medications   Medication Sig Start Date End Date Taking? Authorizing Provider  ALPRAZolam Duanne Moron) 0.5 MG tablet Take 1 tablet by mouth 2 (two) times daily as needed. anxiety 01/12/14  Yes Historical Provider, MD  aspirin EC 81 MG tablet Take 81 mg by mouth daily.   Yes Historical Provider, MD  HYDROcodone-acetaminophen (NORCO/VICODIN) 5-325 MG per tablet Take 1-2 tablets by mouth every 6 (six) hours as needed for moderate pain. 01/27/14  Yes Fredia Sorrow, MD  JANUMET 50-1000 MG per tablet Take 1 tablet by mouth 2 (two) times daily. 01/12/14  Yes Historical Provider, MD  Multiple Vitamins-Minerals (MULTIVITAMINS THER. W/MINERALS) TABS tablet Take 1 tablet by mouth daily.   Yes Historical Provider, MD  pantoprazole (PROTONIX) 40 MG tablet Take 1 tablet by mouth daily. 01/23/14  Yes Historical Provider, MD  pravastatin (PRAVACHOL) 40 MG tablet Take 1 tablet by mouth daily. 01/01/14  Yes Historical Provider, MD   BP 110/64  Pulse 76  Temp(Src) 98 F (36.7 C) (Oral)  Resp 21  Ht 6' (1.829 m)  Wt 234 lb (106.142 kg)  BMI 31.73 kg/m2  SpO2 94% Physical Exam  Nursing note and vitals reviewed. Constitutional: He is oriented to person, place, and time. He appears well-developed and well-nourished. No distress.  HENT:  Head: Normocephalic and atraumatic.  Mouth/Throat: Oropharynx is clear and  moist. No oropharyngeal exudate.  Eyes: Conjunctivae and EOM are normal. Pupils are equal, round, and reactive to light. Right eye exhibits no discharge. Left eye exhibits no discharge.  Neck: Normal range of motion. Neck supple. No tracheal deviation present.  Cardiovascular: Normal rate, regular rhythm and normal heart sounds.  Exam reveals no  friction rub.   No murmur heard. Cap refill less than 3 seconds Negative swelling or pitting edema identified to the lower extremities  Pulmonary/Chest: Effort normal and breath sounds normal. No respiratory distress. He has no wheezes. He has no rales. He exhibits no tenderness.  Patient stable to speak in full sentences without difficulty Negative use of accessory muscles Negative stridor  Abdominal: Soft. Bowel sounds are normal. He exhibits no distension. There is no tenderness. There is no rebound and no guarding.  Negative abdominal distention Abdomen soft upon palpation Bowel sounds normal active in all 4 quadrants Negative peritoneal Negative guarding or rigidity noted  Musculoskeletal: Normal range of motion. He exhibits no edema and no tenderness.  Full ROM to upper and lower extremities without difficulty noted, negative ataxia noted.  Lymphadenopathy:    He has no cervical adenopathy.  Neurological: He is alert and oriented to person, place, and time. No cranial nerve deficit. He exhibits normal muscle tone. Coordination normal.  Cranial nerves III-XII grossly intact Strength 5+/5+ to upper and lower extremities bilaterally with resistance applied, equal distribution noted Equal grip strength Negative facial droop Negative slurred speech Negative aphasia Gait proper, proper balance - negative sway, negative drift, negative step-offs  Skin: No rash noted. He is diaphoretic. No erythema.  Psychiatric: He has a normal mood and affect. His behavior is normal. Thought content normal.    ED Course  Procedures (including critical care time)  10:19 AM This provider spoke with Dr. Joretta Bachelor, Internal Medicine Teaching Services - discussed case, labs, imaging, vitals in great detail. Patient to be admitted to the hospital for cardiac rule out. Leukocytosis to be worked up.   Results for orders placed during the hospital encounter of 03/13/14  CBC WITH DIFFERENTIAL      Result Value  Ref Range   WBC 15.0 (*) 4.0 - 10.5 K/uL   RBC 3.87 (*) 4.22 - 5.81 MIL/uL   Hemoglobin 11.8 (*) 13.0 - 17.0 g/dL   HCT 35.5 (*) 39.0 - 52.0 %   MCV 91.7  78.0 - 100.0 fL   MCH 30.5  26.0 - 34.0 pg   MCHC 33.2  30.0 - 36.0 g/dL   RDW 15.3  11.5 - 15.5 %   Platelets 298  150 - 400 K/uL   Neutrophils Relative % 72  43 - 77 %   Neutro Abs 10.7 (*) 1.7 - 7.7 K/uL   Lymphocytes Relative 14  12 - 46 %   Lymphs Abs 2.1  0.7 - 4.0 K/uL   Monocytes Relative 13 (*) 3 - 12 %   Monocytes Absolute 2.0 (*) 0.1 - 1.0 K/uL   Eosinophils Relative 1  0 - 5 %   Eosinophils Absolute 0.2  0.0 - 0.7 K/uL   Basophils Relative 0  0 - 1 %   Basophils Absolute 0.0  0.0 - 0.1 K/uL  COMPREHENSIVE METABOLIC PANEL      Result Value Ref Range   Sodium 137  137 - 147 mEq/L   Potassium 4.2  3.7 - 5.3 mEq/L   Chloride 97  96 - 112 mEq/L   CO2 24  19 - 32 mEq/L  Glucose, Bld 162 (*) 70 - 99 mg/dL   BUN 17  6 - 23 mg/dL   Creatinine, Ser 0.99  0.50 - 1.35 mg/dL   Calcium 8.9  8.4 - 10.5 mg/dL   Total Protein 7.7  6.0 - 8.3 g/dL   Albumin 3.7  3.5 - 5.2 g/dL   AST 30  0 - 37 U/L   ALT 47  0 - 53 U/L   Alkaline Phosphatase 73  39 - 117 U/L   Total Bilirubin 1.3 (*) 0.3 - 1.2 mg/dL   GFR calc non Af Amer 85 (*) >90 mL/min   GFR calc Af Amer >90  >90 mL/min   Anion gap 16 (*) 5 - 15  TROPONIN I      Result Value Ref Range   Troponin I <0.30  <0.30 ng/mL  PRO B NATRIURETIC PEPTIDE      Result Value Ref Range   Pro B Natriuretic peptide (BNP) 190.3 (*) 0 - 125 pg/mL  LIPASE, BLOOD      Result Value Ref Range   Lipase 29  11 - 59 U/L    Labs Review Labs Reviewed  CBC WITH DIFFERENTIAL - Abnormal; Notable for the following:    WBC 15.0 (*)    RBC 3.87 (*)    Hemoglobin 11.8 (*)    HCT 35.5 (*)    Neutro Abs 10.7 (*)    Monocytes Relative 13 (*)    Monocytes Absolute 2.0 (*)    All other components within normal limits  COMPREHENSIVE METABOLIC PANEL - Abnormal; Notable for the following:     Glucose, Bld 162 (*)    Total Bilirubin 1.3 (*)    GFR calc non Af Amer 85 (*)    Anion gap 16 (*)    All other components within normal limits  PRO B NATRIURETIC PEPTIDE - Abnormal; Notable for the following:    Pro B Natriuretic peptide (BNP) 190.3 (*)    All other components within normal limits  TROPONIN I  LIPASE, BLOOD  URINALYSIS, ROUTINE W REFLEX MICROSCOPIC    Imaging Review Dg Chest Port 1 View  03/13/2014   CLINICAL DATA:  Short of breath, productive cough  EXAM: PORTABLE CHEST - 1 VIEW  COMPARISON:  01/27/2014  FINDINGS: Normal mediastinum and cardiac silhouette. Normal pulmonary vasculature. No evidence of effusion, infiltrate, or pneumothorax. No acute bony abnormality. Low lung volumes  IMPRESSION: Low lung volumes.  No acute findings   Electronically Signed   By: Suzy Bouchard M.D.   On: 03/13/2014 09:38     EKG Interpretation   Date/Time:  Sunday March 13 2014 08:26:34 EDT Ventricular Rate:  79 PR Interval:  156 QRS Duration: 87 QT Interval:  347 QTC Calculation: 398 R Axis:   35 Text Interpretation:  Age not entered, assumed to be  62 years old for  purpose of ECG interpretation Sinus rhythm Baseline wander in lead(s) III  since last tracing no significant change Confirmed by BELFI  MD, MELANIE  ([id number]) on 03/13/2014 8:49:12 AM      MDM   Final diagnoses:  Chest pain, unspecified chest pain type    Medications  aspirin chewable tablet 324 mg (243 mg Oral Given 03/13/14 0840)  sodium chloride 0.9 % bolus 1,000 mL (1,000 mLs Intravenous New Bag/Given 03/13/14 0919)  ondansetron (ZOFRAN) injection 4 mg (4 mg Intravenous Given 03/13/14 0927)   Filed Vitals:   03/13/14 0829 03/13/14 0831 03/13/14 0930  BP:  132/78 110/64  Pulse:  90 76  Temp:  98 F (36.7 C)   TempSrc:  Oral   Resp:  20 21  Height: 6' (1.829 m)    Weight: 234 lb (106.142 kg)    SpO2:  95% 94%   This provider reviewed patient's chart. Patient was seen and assessed in ED setting at  [facility] on 01/27/2014 where patient's delta troponins were negative, chest x-ray unremarkable. D-dimer elevated with a negative CT angiogram of the chest. Patient was discharged home with Norco and diagnosed with chest wall pain. EKG noted normal sinus rhythm with a heart rate 70 beats per minute with no significant change from last tracing. Troponin negative elevation. BNP mildly elevated at 190.3. CBC mildly elevated white blood cell count of 15.0 with elevated neutrophils of 10.7. Hemoglobin 11.8-decreased when compared to one month ago when patient was 12.3. CMP noted BUN of 17, creatinine 0.99. Negative elevated AST, ALT, alkaline phosphatase. Mildly elevated bilirubin of 1.3. Lipase negative elevation. Chest x-ray negative for acute cardiac pulmonary disease-low lung volumes noted. This provider spoke with internal medicine teaching services. Patient to be admitted for cardiac rule out. Patient has a HEART score of 4. Discussed plan for admission with patient and wife-agree to plan of care. Patient stable for transfer.  Jamse Mead, PA-C 03/13/14 1713

## 2014-03-13 NOTE — Consult Note (Signed)
CARDIOLOGY CONSULT NOTE       Patient ID: Douglas Mason MRN: [id number] DOB/AGE: 09-21-1950 63 y.o.  Admit date: 03/13/2014 Referring Physician:  Beryle Beams Primary Physician: Delphina Cahill, MD Primary Cardiologist:  New  Reason for Consultation:  Chest pain  Principal Problem:   Chest pain Active Problems:   HTN (hypertension)   Diabetes   GERD (gastroesophageal reflux disease)   HPI:   63 yo from [place].  Sick for a couple of weeks.  Seen in [facility] ER 5/28 with chest pain R/O  CT with no PE  F/U with Dr [name] his primary  Thursday had fever, chills cough.  WBC was high Not put on antibiotics Continued to feel poorly and decided to come to [facility] with worsening pleuritic type pain.  No history of CAD  CRF DM and elevated cholesterol.  Mild exertional dyspnea.  No LE edema or calf pain.  Has not had previous Ischemic w/u  Still works at [employer]  Denies TB, COPD asthma.  No dysuria.  Has GERD but no GI symptoms or indigestion while sick  In ER [facility] ECG was normal with no signs of pericarditis  CBC was elevated at that Time as well.    ROS All other systems reviewed and negative except as noted above  Past Medical History  Diagnosis Date  . Hypertension   . Diabetes mellitus without complication   . GERD (gastroesophageal reflux disease)     No family history on file.  History   Social History  . Marital Status: Married    Spouse Name: N/A    Number of Children: N/A  . Years of Education: N/A   Occupational History  . Not on file.   Social History Main Topics  . Smoking status: Former Research scientist (life sciences)  . Smokeless tobacco: Not on file  . Alcohol Use: No  . Drug Use: No  . Sexual Activity: Not on file   Other Topics Concern  . Not on file   Social History Narrative  . No narrative on file    History reviewed. No pertinent past surgical history.   . heparin  5,000 Units Subcutaneous 3 times per day  . insulin aspart  0-15 Units Subcutaneous TID WC  . metoprolol  tartrate  25 mg Oral BID  . pantoprazole  40 mg Oral Daily  . simvastatin  80 mg Oral q1800      Physical Exam: Blood pressure 117/64, pulse 82, temperature 98.1 F (36.7 C), temperature source Oral, resp. rate 20, height 6' (1.829 m), weight 234 lb (106.142 kg), SpO2 93.00%.   Affect appropriate Healthy:  appears stated age 63: normal Neck supple with no adenopathy JVP normal no bruits no thyromegaly Lungs basilar crackles  no wheezing and good diaphragmatic motion Heart:  S1/S2 no murmur, no rub, gallop or click PMI normal Abdomen: benighn, BS positve, no tenderness, no AAA no bruit.  No HSM or HJR Distal pulses intact with no bruits No edema Neuro non-focal Skin warm and dry No muscular weakness   Labs:   Lab Results  Component Value Date   WBC 15.0* 03/13/2014   HGB 11.8* 03/13/2014   HCT 35.5* 03/13/2014   MCV 91.7 03/13/2014   PLT 298 03/13/2014    Recent Labs Lab 03/13/14 0828  NA 137  K 4.2  CL 97  CO2 24  BUN 17  CREATININE 0.99  CALCIUM 8.9  PROT 7.7  BILITOT 1.3*  ALKPHOS 73  ALT 47  AST 30  GLUCOSE 162*   Lab Results  Component Value Date   TROPONINI <0.30 03/13/2014       Radiology: Dg Chest 2 View  03/13/2014   CLINICAL DATA:  Productive cough x4 days  EXAM: CHEST  2 VIEW  COMPARISON:  Chest radiograph 03/13/2014  FINDINGS: Stable enlarged cardiac silhouette. There is right lower lobe opacity. Mild central venous congestion. Trace effusions.  IMPRESSION: Right lower lobe infiltrate versus atelectasis.   Electronically Signed   By: Suzy Bouchard M.D.   On: 03/13/2014 12:46   Dg Chest Port 1 View  03/13/2014   CLINICAL DATA:  Short of breath, productive cough  EXAM: PORTABLE CHEST - 1 VIEW  COMPARISON:  01/27/2014  FINDINGS: Normal mediastinum and cardiac silhouette. Normal pulmonary vasculature. No evidence of effusion, infiltrate, or pneumothorax. No acute bony abnormality. Low lung volumes  IMPRESSION: Low lung volumes.  No acute  findings   Electronically Signed   By: Suzy Bouchard M.D.   On: 03/13/2014 09:38    EKG:  SR normal no change from 5/29   ASSESSMENT AND PLAN:  Chest Pain:  Doubt anginal pain.  Reviewed CT done 5/29 minimal coronary calcium  ECG normal  Clinical history seems more consistent with pleuropericardial pain  CXR with ? RLL pneumonia Vs linear atelectasis.  WBC elevated with left shipt  Check ESR and echo  R/o other sources of infection.  Antibiotics per primary service  If ESR elevated consider colchicine and prednisone Will tentatively plan stress myovue for Tuesday pending initial w/u  Chol:  Continue statin  DM:   Home meds SS insulin should add low dose ACE  Signed: Jenkins Rouge 03/13/2014, 1:27 PM

## 2014-03-13 NOTE — ED Notes (Addendum)
Pt c/o left sided chest pain that started Thursday and got worse this morning. Pt is diaphoretic and also c/o nausea,SOB, lightheaded and pain that radiated to neck some. Pt self administered ASA 81mg  and Hydrocodone this morning. The hydrocodone "knocked the edge off".

## 2014-03-13 NOTE — H&P (Signed)
Date: 03/13/2014               Patient Name:  Douglas Mason MRN: [id number]  DOB: 06-06-1951 Age / Sex: 63 y.o., male   PCP: Delphina Cahill, MD         Medical Service: Internal Medicine Teaching Service         Attending Physician: Dr. Annia Belt, MD    First Contact: Dr. Lottie Mussel Pager: [phone]  Second Contact: Dr. Clinton Gallant Pager: [phone]       After Hours (After 5p/  First Contact Pager: [phone]  weekends / holidays): Second Contact Pager: [phone]   Chief Complaint: chest pain  History of Present Illness: Douglas Mason is a 63 year old man with HTN, DM2 and GERD who presents with a four day history of chest pain. He says he has had this pain on and off for months with an inconclusive workup at [facility] in 12/2013 but has been worse the past four days. He describes the pain as a sharp pain on his left chest that radiates up to his neck. He has been taking hydrocodone at home which makes it better as does sitting upright. He has a cough that makes it worse as does deep breaths. He says the cough has been for two days and produces thick gray phlegm. He says physical exertion does not make it better or worse. He also reports sweating, orthopnea, heart palpitations, loss of appetite and some nausea. He denies any fevers, chills, dizziness, syncope, recent trauma, abdominal pain, emesis, diarrhea, or constipation. He says he has had GERD medically managed for years and this feels different.  In the ED, he had negative troponin x 1, EKG no evidence of ischemia, and received 1 ASA 324, zofran 4 mg iv, and 1 L NS bolus.  Meds: Current Facility-Administered Medications  Medication Dose Route Frequency Provider Last Rate Last Dose  . acetaminophen (TYLENOL) tablet 650 mg  650 mg Oral Q4H PRN Clinton Gallant, MD      . gi cocktail (Maalox,Lidocaine,Donnatal)  30 mL Oral QID PRN Clinton Gallant, MD   30 mL at 03/13/14 1142  . heparin injection 5,000 Units  5,000 Units Subcutaneous 3 times per day  Clinton Gallant, MD      . HYDROcodone-acetaminophen (NORCO/VICODIN) 5-325 MG per tablet 1-2 tablet  1-2 tablet Oral Q6H PRN Clinton Gallant, MD      . insulin aspart (novoLOG) injection 0-15 Units  0-15 Units Subcutaneous TID WC Clinton Gallant, MD      . lisinopril (PRINIVIL,ZESTRIL) tablet 5 mg  5 mg Oral Daily Josue Hector, MD      . metoprolol tartrate (LOPRESSOR) tablet 25 mg  25 mg Oral BID Clinton Gallant, MD      . morphine 2 MG/ML injection 2 mg  2 mg Intravenous Q2H PRN Clinton Gallant, MD   2 mg at 03/13/14 1142  . ondansetron (ZOFRAN) injection 4 mg  4 mg Intravenous Q6H PRN Clinton Gallant, MD      . pantoprazole (PROTONIX) EC tablet 40 mg  40 mg Oral Daily Clinton Gallant, MD      . simvastatin (ZOCOR) tablet 80 mg  80 mg Oral q1800 Clinton Gallant, MD        Allergies: Allergies as of 03/13/2014  . (No Known Allergies)   Past Medical History  Diagnosis Date  . Hypertension   . Diabetes mellitus without complication   . GERD (gastroesophageal reflux disease)    History  reviewed. No pertinent past surgical history. No family history on file. History   Social History  . Marital Status: Married    Spouse Name: N/A    Number of Children: N/A  . Years of Education: N/A   Occupational History  . Not on file.   Social History Main Topics  . Smoking status: Former Research scientist (life sciences)  . Smokeless tobacco: Not on file  . Alcohol Use: No  . Drug Use: No  . Sexual Activity: Yes   Other Topics Concern  . Not on file   Social History Narrative  . No narrative on file    Review of Systems: A comprehensive review of systems was negative except for: as noted in the HPI above  Physical Exam: Blood pressure 117/64, pulse 82, temperature 98.1 F (36.7 C), temperature source Oral, resp. rate 20, height 6' (1.829 m), weight 234 lb (106.142 kg), SpO2 93.00%. BP 117/64  Pulse 82  Temp(Src) 98.1 F (36.7 C) (Oral)  Resp 20  Ht 6' (1.829 m)  Wt 234 lb (106.142 kg)  BMI 31.73 kg/m2  SpO2 93%  General Appearance:     Alert, cooperative, no distress, appears stated age  Head:    Normocephalic, without obvious abnormality, atraumatic  Eyes:    PERRL, conjunctiva/corneas clear      Throat:   Moist mucous membranes  Back:     Symmetric, no curvature  Lungs:     Clear to auscultation bilaterally, respirations unlabored  Chest wall:    No tenderness or deformity  Heart:    Regular rate and rhythm, S1 and S2 normal, no murmur, rub   or gallop  Abdomen:     Soft, non-tender, bowel sounds active all four quadrants,    no masses, no organomegaly  Extremities:   Extremities normal, atraumatic, no cyanosis or edema  Pulses:   2+ and symmetric radial, DP, and PT pulses    Lab results: Basic Metabolic Panel:  Recent Labs  03/13/14 0828  NA 137  K 4.2  CL 97  CO2 24  GLUCOSE 162*  BUN 17  CREATININE 0.99  CALCIUM 8.9   Liver Function Tests:  Recent Labs  03/13/14 0828  AST 30  ALT 47  ALKPHOS 73  BILITOT 1.3*  PROT 7.7  ALBUMIN 3.7    Recent Labs  03/13/14 0911  LIPASE 29   No results found for this basename: AMMONIA,  in the last 72 hours CBC:  Recent Labs  03/13/14 0828  WBC 15.0*  NEUTROABS 10.7*  HGB 11.8*  HCT 35.5*  MCV 91.7  PLT 298   Cardiac Enzymes:  Recent Labs  03/13/14 0828  TROPONINI <0.30   BNP:  Recent Labs  03/13/14 0911  PROBNP 190.3*   D-Dimer: No results found for this basename: DDIMER,  in the last 72 hours CBG: No results found for this basename: GLUCAP,  in the last 72 hours Hemoglobin A1C: No results found for this basename: HGBA1C,  in the last 72 hours Fasting Lipid Panel: No results found for this basename: CHOL, HDL, LDLCALC, TRIG, CHOLHDL, LDLDIRECT,  in the last 72 hours Thyroid Function Tests: No results found for this basename: TSH, T4TOTAL, FREET4, T3FREE, THYROIDAB,  in the last 72 hours Anemia Panel: No results found for this basename: VITAMINB12, FOLATE, FERRITIN, TIBC, IRON, RETICCTPCT,  in the last 72  hours Coagulation: No results found for this basename: LABPROT, INR,  in the last 72 hours Urine Drug Screen: Drugs of Abuse  Component Value Date/Time   LABOPIA POSITIVE* 03/13/2014 1115    Alcohol Level: No results found for this basename: ETH,  in the last 72 hours Urinalysis:  Recent Labs  03/13/14 1115  COLORURINE YELLOW  LABSPEC 1.031*  PHURINE 5.0  GLUCOSEU NEGATIVE  HGBUR NEGATIVE  BILIRUBINUR MODERATE*  KETONESUR 15*  PROTEINUR 100*  UROBILINOGEN 4.0*  NITRITE POSITIVE*  LEUKOCYTESUR TRACE*  few granular casts on microscopy   Imaging results:  Dg Chest 2 View  03/13/2014   CLINICAL DATA:  Productive cough x4 days  EXAM: CHEST  2 VIEW  COMPARISON:  Chest radiograph 03/13/2014  FINDINGS: Stable enlarged cardiac silhouette. There is right lower lobe opacity. Mild central venous congestion. Trace effusions.  IMPRESSION: Right lower lobe infiltrate versus atelectasis.   Electronically Signed   By: Genevive Bi M.D.   On: 03/13/2014 12:46   Dg Chest Port 1 View  03/13/2014   CLINICAL DATA:  Short of breath, productive cough  EXAM: PORTABLE CHEST - 1 VIEW  COMPARISON:  01/27/2014  FINDINGS: Normal mediastinum and cardiac silhouette. Normal pulmonary vasculature. No evidence of effusion, infiltrate, or pneumothorax. No acute bony abnormality. Low lung volumes  IMPRESSION: Low lung volumes.  No acute findings   Electronically Signed   By: Genevive Bi M.D.   On: 03/13/2014 09:38    Other results: EKG: normal EKG, normal sinus rhythm.  Assessment & Plan by Problem: Principal Problem:   Chest pain Active Problems:   HTN (hypertension)   Diabetes   GERD (gastroesophageal reflux disease)  1. Chest Pain: The etiology of Mr. Myricks's chest pain is unclear. First troponin negative and EKG wnl suggests this is not ACS but it is left sided, radiating to neck, and . Also, it has been intermittent for four days and independent of exertion. He denies drug use but UDS  pending. Aortic dissection is unlikely as it is chronic and there was no mediastinal widening on CXR. PE is unlikely as he is not tachycardic with no calf tenderness on exam and no known risk factors. He denies any trauma or chest wall tenderness to palpation in terms of costochondritis. He says this is different than his GERD pain. Pneumonia is unlikely as the cause of this pain as it has been a chronic issue and he is afebrile. The portable CXR was not a good view and a formal 2-view has been ordered. His description of relief by sitting upright is consistent with pericarditis and it could be viral if a virus is causing his cough and congestion which would cause leukocytosis. Porphyria can also be considered given urobilinogen on UA and dark appearance of urine.  -cards consult -cycle troponin-I x 3 -telemetry monitoring -am 12 lead ekg -echo -consider stress myoview -f/u UDS, lipid panel, CXR, TSH, ESR -ASA 81, metoprolol 25 mg po BID, simvastatin 80 mg po daily -norco/vicodin 1-2 tabs q6hprn  2. Productive Cough: Mr. Shabazz has had a cough for 2 days which is productive of thick grey phlegm. His WBC is 15 with diff demonstrating monocytosis of 2% which makes viral pneumonia possible. His cough may also be related to reflux. Consider mycoplasma pneumonia which would not show up on CXR. Will hold off antibiotics and continue to monitor. -f/u 2 view CXR -sputum culture -cont to monitor  3. HTN: Mr. Callanan BP is 110-130s/60-70s and is well controlled. -metoprolol 25 mg po BID -consider ace inhibitor  4. DM2: Last A1c is unknown but glucose on presentation was 162. -f/u HgbA1c -SSI  5. GERD: Patient does not believe his GERD is cause of this pain as he has had GERD for years and this feels different than his usual reflux. -protonix 40 mg po daily -GI cocktail QIDPRN  DVT/PE PPx: heparin 5000U [place] q8h  Diet: heart healthy/carb modified  Dispo: Disposition is deferred at this time,  awaiting improvement of current medical problems. Anticipated discharge in approximately 2 day(s).   The patient does have a current PCP Delphina Cahill, MD) and does need an [facility] hospital follow-up appointment after discharge.  The patient does not have transportation limitations that hinder transportation to clinic appointments.  Signed: Kelby Aline, MD 03/13/2014, 1:50 PM

## 2014-03-13 NOTE — ED Notes (Signed)
Attempted report 

## 2014-03-13 NOTE — ED Notes (Signed)
Patient transported to X-ray 

## 2014-03-14 ENCOUNTER — Inpatient Hospital Stay (HOSPITAL_COMMUNITY): Payer: BC Managed Care – PPO

## 2014-03-14 ENCOUNTER — Encounter (HOSPITAL_COMMUNITY): Payer: Self-pay | Admitting: Radiology

## 2014-03-14 DIAGNOSIS — R072 Precordial pain: Secondary | ICD-10-CM

## 2014-03-14 LAB — BASIC METABOLIC PANEL
Anion gap: 15 (ref 5–15)
BUN: 19 mg/dL (ref 6–23)
CO2: 25 meq/L (ref 19–32)
CREATININE: 0.92 mg/dL (ref 0.50–1.35)
Calcium: 8.9 mg/dL (ref 8.4–10.5)
Chloride: 95 mEq/L — ABNORMAL LOW (ref 96–112)
GFR calc Af Amer: >90 mL/min (ref >90)
GFR calc non Af Amer: 87 mL/min — ABNORMAL LOW (ref >90)
Glucose, Bld: 139 mg/dL — ABNORMAL HIGH (ref 70–99)
Potassium: 4.8 mEq/L (ref 3.7–5.3)
Sodium: 135 mEq/L — ABNORMAL LOW (ref 137–147)

## 2014-03-14 LAB — CBC
HCT: 35.5 % — ABNORMAL LOW (ref 39.0–52.0)
HEMOGLOBIN: 11.5 g/dL — AB (ref 13.0–17.0)
MCH: 30.7 pg (ref 26.0–34.0)
MCHC: 32.4 g/dL (ref 30.0–36.0)
MCV: 94.7 fL (ref 78.0–100.0)
Platelets: 314 10*3/uL (ref 150–400)
RBC: 3.75 MIL/uL — ABNORMAL LOW (ref 4.22–5.81)
RDW: 15.6 % — ABNORMAL HIGH (ref 11.5–15.5)
WBC: 13.8 10*3/uL — ABNORMAL HIGH (ref 4.0–10.5)

## 2014-03-14 LAB — GLUCOSE, CAPILLARY
GLUCOSE-CAPILLARY: 110 mg/dL — AB (ref 70–99)
Glucose-Capillary: 125 mg/dL — ABNORMAL HIGH (ref 70–99)
Glucose-Capillary: 184 mg/dL — ABNORMAL HIGH (ref 70–99)
Glucose-Capillary: 123 mg/dL — ABNORMAL HIGH (ref 70–99)

## 2014-03-14 LAB — URINE CULTURE
CULTURE: NO GROWTH
Colony Count: NO GROWTH

## 2014-03-14 LAB — LEGIONELLA ANTIGEN, URINE: Legionella Antigen, Urine: NEGATIVE

## 2014-03-14 LAB — SAVE SMEAR

## 2014-03-14 MED ORDER — IOHEXOL 300 MG/ML  SOLN
100.0000 mL | Freq: Once | INTRAMUSCULAR | Status: AC | PRN
  Administered 2014-03-14: 100 mL via INTRAVENOUS

## 2014-03-14 NOTE — Progress Notes (Signed)
Patient Name: Douglas Mason Date of Encounter: 03/14/2014     Principal Problem:   Chest pain Active Problems:   HTN (hypertension)   Diabetes   GERD (gastroesophageal reflux disease)    SUBJECTIVE  Some chill but no fever last night. Chest largely resolved, would only occur with cough. Described it as a sharp pain over L chest worsen with cough. Continue to have some SOB. Denies any GI discomfort. Denies any recent sick contact.  CURRENT MEDS . azithromycin  500 mg Intravenous Q24H  . cefTRIAXone (ROCEPHIN)  IV  1 g Intravenous Q24H  . heparin  5,000 Units Subcutaneous 3 times per day  . insulin aspart  0-15 Units Subcutaneous TID WC  . lisinopril  5 mg Oral Daily  . metoprolol tartrate  25 mg Oral BID  . pantoprazole  40 mg Oral Daily  . simvastatin  80 mg Oral q1800    OBJECTIVE  Filed Vitals:   03/13/14 2100 03/13/14 2253 03/14/14 0532 03/14/14 0644  BP: 99/52 120/56 118/71 113/60  Pulse: 80 85 86 85  Temp: 98 F (36.7 C)  98.3 F (36.8 C) 100 F (37.8 C)  TempSrc: Oral  Oral Oral  Resp: 16  16   Height:      Weight:      SpO2: 97%  97% 99%   No intake or output data in the 24 hours ending 03/14/14 0806 Filed Weights   03/13/14 0829 03/13/14 1249  Weight: 234 lb (106.142 kg) 234 lb (106.142 kg)    PHYSICAL EXAM  General: Pleasant, NAD. Neuro: Alert and oriented X 3. Moves all extremities spontaneously. Psych: Normal affect. HEENT:  Normal  Neck: Supple without bruits or JVD. Lungs:  Resp regular and unlabored, intermittent bibasilar rale with decreased air movement, otherwise, no significant rhonchi or wheezing Heart: RRR no s3, s4, or murmurs. No pericardial rub Abdomen: Soft, non-tender, non-distended, BS + x 4.  Extremities: No clubbing, cyanosis or edema. DP/PT/Radials 2+ and equal bilaterally.  Accessory Clinical Findings  CBC  Recent Labs  03/13/14 0828 03/14/14 0540  WBC 15.0* 13.8*  NEUTROABS 10.7*  --   HGB 11.8* 11.5*  HCT  35.5* 35.5*  MCV 91.7 94.7  PLT 298 263   Basic Metabolic Panel  Recent Labs  03/13/14 0828 03/14/14 0540  NA 137 135*  K 4.2 4.8  CL 97 95*  CO2 24 25  GLUCOSE 162* 139*  BUN 17 19  CREATININE 0.99 0.92  CALCIUM 8.9 8.9   Liver Function Tests  Recent Labs  03/13/14 0828  AST 30  ALT 47  ALKPHOS 73  BILITOT 1.3*  PROT 7.7  ALBUMIN 3.7    Recent Labs  03/13/14 0911  LIPASE 29   Cardiac Enzymes  Recent Labs  03/13/14 1425 03/13/14 1828 03/13/14 2042  TROPONINI <0.30 <0.30 <0.30   Hemoglobin A1C  Recent Labs  03/13/14 1425  HGBA1C 5.5   Fasting Lipid Panel  Recent Labs  03/13/14 1425  CHOL 122  HDL 34*  LDLCALC 74  TRIG 68  CHOLHDL 3.6   Thyroid Function Tests  Recent Labs  03/13/14 1425  TSH 1.330    TELE  NSR with HR 70-80s  ECG  NSR with HR 70s, no significant ST-T wave changes  Radiology/Studies  Dg Chest 2 View  03/13/2014   CLINICAL DATA:  Productive cough x4 days  EXAM: CHEST  2 VIEW  COMPARISON:  Chest radiograph 03/13/2014  FINDINGS: Stable enlarged cardiac silhouette. There is  right lower lobe opacity. Mild central venous congestion. Trace effusions.  IMPRESSION: Right lower lobe infiltrate versus atelectasis.   Electronically Signed   By: Suzy Bouchard M.D.   On: 03/13/2014 12:46   Dg Chest Port 1 View  03/13/2014   CLINICAL DATA:  Short of breath, productive cough  EXAM: PORTABLE CHEST - 1 VIEW  COMPARISON:  01/27/2014  FINDINGS: Normal mediastinum and cardiac silhouette. Normal pulmonary vasculature. No evidence of effusion, infiltrate, or pneumothorax. No acute bony abnormality. Low lung volumes  IMPRESSION: Low lung volumes.  No acute findings   Electronically Signed   By: Suzy Bouchard M.D.   On: 03/13/2014 09:38    ASSESSMENT AND PLAN  1. Chest pain, pleuritic in nature, with elevated sed rate and WBC  - RLL infiltrate vs atelectasis on CXR  - possible differential diagnosis include viral or bacterial  URI/pneumonia, pericarditis. ESR elevated  - unlikely to be coronary related, minimal calcification on recent CTA of chest in May 2015 (which also showed no PE)  - pending Echo to r/o pericardial effusion, if present with do a trial of Indocin and colchicine  - further workup per primary team  - will schedule for myoview tomorrow, NPO past midnight  2. Productive cough x 2wks, on abx (azithromycin and ceftriaxone) per IM team 3. DM 4. Hyperlipidemia  Signed, Almyra Deforest PA-C  Patient examined, history reviewed. Suspect pulmonary etiology with pleurisy and cough productive of gray sputum. 2D echo pending. Will plan lexiscan myoview in am to complete cardiac workup. EKG on admission shows no changes of pericarditis.

## 2014-03-14 NOTE — Progress Notes (Signed)
Subjective: Douglas Mason is a 63yo male on hospital day 2 with a past medical history of hypertension, type 2 diabetes mellitus (A1c: 5.5%), and GERD who presented to the ED yesterday with a 4 day history of sharp, stabbing chest pain localized to the left chest and radiating to the left neck.   Overnight he says that he had trouble sleeping due to shortness of breath and cough while lying down. He would wake up, cough up phlegm, and go back to sleep until coughing again.   This morning his chest pain increased while getting the echocardiogram. He reports that laying on his left side aggravated the pain and it was at an 8/10 in severity until he received 2 tablets of Norco/Vicodin. His pain is now improved and he is sitting comfortably, though short of breath.  He endorses anorexia, shortness of breath, orthopnea, continued productive cough, chills, and continued chest pain.  He denies any nausea, vomiting, abdominal pain.  Objective: Filed Vitals:   03/14/14 0644  BP: 113/60  Pulse: 85  Temp: 100 F (37.8 C)  Resp:    Vital signs in last 24 hours: Filed Vitals:   03/13/14 2100 03/13/14 2253 03/14/14 0532 03/14/14 0644  BP: 99/52 120/56 118/71 113/60  Pulse: 80 85 86 85  Temp: 98 F (36.7 C)  98.3 F (36.8 C) 100 F (37.8 C)  TempSrc: Oral  Oral Oral  Resp: 16  16   Height:      Weight:      SpO2: 97%  97% 99%   Weight change:  No intake or output data in the 24 hours ending 03/14/14 1055  General: resting in bed Cardiac: RRR Pulm: clear to auscultation bilaterally, moving normal volumes of air Abd: soft, nontender, nondistended Neuro: alert and oriented X3  Lab Results: CBC Latest Ref Rng 03/14/2014 03/13/2014 01/27/2014  WBC 4.0 - 10.5 K/uL 13.8(H) 15.0(H) 11.0(H)  Hemoglobin 13.0 - 17.0 g/dL 11.5(L) 11.8(L) 12.3(L)  Hematocrit 39.0 - 52.0 % 35.5(L) 35.5(L) 37.4(L)  Platelets 150 - 400 K/uL 314 298 401(H)   BMET    Component Value Date/Time   NA 135* 03/14/2014  0540   K 4.8 03/14/2014 0540   CL 95* 03/14/2014 0540   CO2 25 03/14/2014 0540   GLUCOSE 139* 03/14/2014 0540   BUN 19 03/14/2014 0540   CREATININE 0.92 03/14/2014 0540   CALCIUM 8.9 03/14/2014 0540   GFRNONAA 87* 03/14/2014 0540   GFRAA >90 03/14/2014 0540   Erythrocyte Sedimentation Rate     Component Value Date/Time   ESRSEDRATE 66* 03/13/2014 1425   BNP    Component Value Date/Time   PROBNP 190.3* 03/13/2014 0911   Troponin-I: <30 (negative) x3 Strep pneumoniae urinary antigen: negative Legionella antigen: in process Expectorated sputum culture: needs to be collected Urine culture: in process Hemoglobin A1c: 5.5%  Micro Results: No results found for this or any previous visit (from the past 240 hour(s)). Studies/Results: Dg Chest 2 View  03/13/2014   CLINICAL DATA:  Productive cough x4 days  EXAM: CHEST  2 VIEW  COMPARISON:  Chest radiograph 03/13/2014  FINDINGS: Stable enlarged cardiac silhouette. There is right lower lobe opacity. Mild central venous congestion. Trace effusions.  IMPRESSION: Right lower lobe infiltrate versus atelectasis.   Electronically Signed   By: Suzy Bouchard M.D.   On: 03/13/2014 12:46   Dg Chest Port 1 View  03/13/2014   CLINICAL DATA:  Short of breath, productive cough  EXAM: PORTABLE CHEST - 1 VIEW  COMPARISON:  01/27/2014  FINDINGS: Normal mediastinum and cardiac silhouette. Normal pulmonary vasculature. No evidence of effusion, infiltrate, or pneumothorax. No acute bony abnormality. Low lung volumes  IMPRESSION: Low lung volumes.  No acute findings   Electronically Signed   By: Suzy Bouchard M.D.   On: 03/13/2014 09:38   Medications: I have reviewed the patient's current medications. Scheduled Meds: . azithromycin  500 mg Intravenous Q24H  . cefTRIAXone (ROCEPHIN)  IV  1 g Intravenous Q24H  . heparin  5,000 Units Subcutaneous 3 times per day  . insulin aspart  0-15 Units Subcutaneous TID WC  . lisinopril  5 mg Oral Daily  . metoprolol tartrate   25 mg Oral BID  . pantoprazole  40 mg Oral Daily  . simvastatin  80 mg Oral q1800   Continuous Infusions:  PRN Meds:.acetaminophen, gi cocktail, guaiFENesin-codeine, HYDROcodone-acetaminophen, morphine injection, ondansetron (ZOFRAN) IV Assessment/Plan: Principal Problem:   Chest pain Active Problems:   HTN (hypertension)   Diabetes   GERD (gastroesophageal reflux disease)   1. Chest pain Cardiology saw patient to weigh in on current 5 day history of stabbing chest pain localized to the left chest and several month history of chest pain in the same location. Per cardiology, likely not anginal pain. ESR of 66 (7/12). Echocardiogram performed 7/12. CTA (01/27/14) showed no acute PE, trace pericardial effusion, and mild coronary calcification, unlikely to be coronary in etiology.  -echocardiogram results pending, if pericardial effusion present: prednisone and colchicine  -stress myoview planned for Tuesday morning, NPO past midnight -continue telemetry -Lisinopril 67m PO daily -Metoprolol tartrate 246mPO twice daily -Simvastatin 8035mO daily -Norco/Vicodin PRN pain -Morphine PRN pain -Zofran PRN nausea -DVT prophylaxis  2. Productive cough Due to chest x ray suggestive of right lower lobe infiltrate, high WBC, and pleuritic chest pain, presumed community acquired pneumonia. Begun on Ceftriaxone/Azithromycin and given guaiFENesin-codeine for cough suppression. WBC has come down to 13.8 (7/13) from 15.0 (7/12). -Ceftriaxone/Azithromycin every 24 hours -Sputum culture needs to be collected -guaiFENesin-codeine PRN cough  3. Hypertension SDP 99-132, DBP 52-78 -metoprolol tartrate 16m26mimvastatin 80mg35mart healthy/carb modified diet  4. Diabetes mellitus type II Hemoglobin A1c 7/12: 5.5%. Sliding scale insulin has been ordered for duration of hospital stay. -continue novoLOG -CBG monitoring  5. Gastroesophageal reflux disease Stable for many years, per patient. -Protonix  40mg 22maily  Dispo: Disposition is deferred at this time, awaiting improvement of current medical problems.  Anticipated discharge in approximately 2-3 day(s).   The patient does have a current PCP (Zach Delphina C[facility]illand [facility].  The patient does not know have transportation limitations that hinder transportation to clinic appointments.  .Services Needed at time of discharge: Y = Yes, Blank = No PT:   OT:   RN:   Equipment:   Other:     LOS: 1 day   KathryAlvina ChouStudent 03/14/2014, 10:55 AM

## 2014-03-14 NOTE — Progress Notes (Signed)
Subjective: Mr. Ellithorpe has no acute events overnight. He reported that when he went down for his echo this morning, he had the same left-sided chest pain when he laid on his side but it improved when he sat up and took 2 tabs norco/vicodin. He rated it 8/10 but said it went back to his baseline pain of 2/10. He still has a productive cough and said this woke him up several times last night. He also notes some shortness of breath and preferred to use the nasal cannula. He denies any palpitations, nausea, emesis, current diaphoresis, or abdominal pain.  Objective: Vital signs in last 24 hours: Filed Vitals:   03/13/14 2253 03/14/14 0532 03/14/14 0644 03/14/14 1315  BP: 120/56 118/71 113/60 112/59  Pulse: 85 86 85 73  Temp:  98.3 F (36.8 C) 100 F (37.8 C) 97.7 F (36.5 C)  TempSrc:  Oral Oral Oral  Resp:  16  19  Height:      Weight:      SpO2:  97% 99% 98%   Weight change:   Intake/Output Summary (Last 24 hours) at 03/14/14 1423 Last data filed at 03/14/14 1309  Gross per 24 hour  Intake    240 ml  Output      0 ml  Net    240 ml   BP 112/59  Pulse 73  Temp(Src) 97.7 F (36.5 C) (Oral)  Resp 19  Ht 6' (1.829 m)  Wt 234 lb (106.142 kg)  BMI 31.73 kg/m2  SpO2 98%  General Appearance:    Alert, cooperative, no distress, appears stated age  HEENT:    Normocephalic, without obvious abnormality, atraumatic, PERRL, moist mucous membranes  Back:     Symmetric, no curvature  Lungs:     Clear to auscultation bilaterally, percussions resonant throughout, respirations unlabored  Chest wall:    No tenderness or deformity  Heart:    Regular rate and rhythm, S1 and S2 normal, no murmur, rub   or gallop, no carotid bruits  Abdomen:     Soft, non-tender, bowel sounds active all four quadrants,    no masses, no organomegaly  Extremities:   Extremities normal, atraumatic, no cyanosis or edema  Pulses:   2+ and symmetric radial, DP, PT pulses   Lab Results: Basic Metabolic  Panel:  Recent Labs Lab 03/13/14 0828 03/14/14 0540  NA 137 135*  K 4.2 4.8  CL 97 95*  CO2 24 25  GLUCOSE 162* 139*  BUN 17 19  CREATININE 0.99 0.92  CALCIUM 8.9 8.9   Liver Function Tests:  Recent Labs Lab 03/13/14 0828  AST 30  ALT 47  ALKPHOS 73  BILITOT 1.3*  PROT 7.7  ALBUMIN 3.7    Recent Labs Lab 03/13/14 0911  LIPASE 29   No results found for this basename: AMMONIA,  in the last 168 hours CBC:  Recent Labs Lab 03/13/14 0828 03/14/14 0540  WBC 15.0* 13.8*  NEUTROABS 10.7*  --   HGB 11.8* 11.5*  HCT 35.5* 35.5*  MCV 91.7 94.7  PLT 298 314   Cardiac Enzymes:  Recent Labs Lab 03/13/14 1425 03/13/14 1828 03/13/14 2042  TROPONINI <0.30 <0.30 <0.30   BNP:  Recent Labs Lab 03/13/14 0911  PROBNP 190.3*   D-Dimer: No results found for this basename: DDIMER,  in the last 168 hours CBG:  Recent Labs Lab 03/13/14 1621 03/13/14 2116 03/14/14 0724 03/14/14 1133  GLUCAP 106* 161* 125* 184*   Hemoglobin A1C:  Recent Labs  Lab 03/13/14 1425  HGBA1C 5.5   Fasting Lipid Panel:  Recent Labs Lab 03/13/14 1425  CHOL 122  HDL 34*  LDLCALC 74  TRIG 68  CHOLHDL 3.6   Thyroid Function Tests:  Recent Labs Lab 03/13/14 1425  TSH 1.330   Coagulation: No results found for this basename: LABPROT, INR,  in the last 168 hours Anemia Panel: No results found for this basename: VITAMINB12, FOLATE, FERRITIN, TIBC, IRON, RETICCTPCT,  in the last 168 hours Urine Drug Screen: Drugs of Abuse     Component Value Date/Time   LABOPIA POSITIVE* 03/13/2014 1115   COCAINSCRNUR NONE DETECTED 03/13/2014 1115   LABBENZ NONE DETECTED 03/13/2014 1115   AMPHETMU NONE DETECTED 03/13/2014 1115   THCU NONE DETECTED 03/13/2014 1115   LABBARB NONE DETECTED 03/13/2014 1115    Alcohol Level: No results found for this basename: ETH,  in the last 168 hours Urinalysis:  Recent Labs Lab 03/13/14 1115  COLORURINE YELLOW  LABSPEC 1.031*  PHURINE 5.0   GLUCOSEU NEGATIVE  HGBUR NEGATIVE  BILIRUBINUR MODERATE*  KETONESUR 15*  PROTEINUR 100*  UROBILINOGEN 4.0*  NITRITE POSITIVE*  LEUKOCYTESUR TRACE*   Misc. Labs: Strep pneumo urine antigen negative Legionella urine antigen negatinve ESR 66   Micro Results: No results found for this or any previous visit (from the past 240 hour(s)). Studies/Results: Dg Chest 2 View  03/13/2014   CLINICAL DATA:  Productive cough x4 days  EXAM: CHEST  2 VIEW  COMPARISON:  Chest radiograph 03/13/2014  FINDINGS: Stable enlarged cardiac silhouette. There is right lower lobe opacity. Mild central venous congestion. Trace effusions.  IMPRESSION: Right lower lobe infiltrate versus atelectasis.   Electronically Signed   By: Suzy Bouchard M.D.   On: 03/13/2014 12:46   Dg Chest Port 1 View  03/13/2014   CLINICAL DATA:  Short of breath, productive cough  EXAM: PORTABLE CHEST - 1 VIEW  COMPARISON:  01/27/2014  FINDINGS: Normal mediastinum and cardiac silhouette. Normal pulmonary vasculature. No evidence of effusion, infiltrate, or pneumothorax. No acute bony abnormality. Low lung volumes  IMPRESSION: Low lung volumes.  No acute findings   Electronically Signed   By: Suzy Bouchard M.D.   On: 03/13/2014 09:38   7/13 2D echo with contrast Left ventricle: The cavity size was normal. Systolic function was normal. The estimated ejection fraction was in the range of 55% to 60%. Wall motion was normal; there were no regional wall motion abnormalities. Left ventricular diastolic function parameters were normal.  Medications: I have reviewed the patient's current medications. Scheduled Meds: . azithromycin  500 mg Intravenous Q24H  . cefTRIAXone (ROCEPHIN)  IV  1 g Intravenous Q24H  . heparin  5,000 Units Subcutaneous 3 times per day  . insulin aspart  0-15 Units Subcutaneous TID WC  . lisinopril  5 mg Oral Daily  . metoprolol tartrate  25 mg Oral BID  . pantoprazole  40 mg Oral Daily  . simvastatin  80 mg  Oral q1800   Continuous Infusions:  PRN Meds:.acetaminophen, gi cocktail, guaiFENesin-codeine, HYDROcodone-acetaminophen, morphine injection, ondansetron (ZOFRAN) IV Assessment/Plan: Principal Problem:   Chest pain Active Problems:   HTN (hypertension)   Diabetes   GERD (gastroesophageal reflux disease)  1. Atypical Chest Pain: The etiology of Mr. Sanfilippo's chest pain is unclear. Troponin negative  X 3 and EKG wnl with only PVC noted on cardiac monitoring although it is left sided, radiating to neck, and associated with diaphoresis in terms of ACS. UDS only positive for opiates he reported  taking for this pain. PE is unlikely as he is not tachycardic with no calf tenderness on exam and no known risk factors. He denies any trauma or chest wall tenderness to palpation in terms of costochondritis. He says this is different than his GERD pain. EKG did not have findings associated with pericarditis and the echo did not demonstrate pericarditis. He is being treated for a possible CAP as his CXR showed RLL infiltrate v atelectasis. Malignancy should also be considered although CTA on 01/27/14 did not note any chest mass. He is followed by cards who have scheduled a myoview stress test tomorrow. -appreciate cards -telemetry monitoring  -stress myoview tomorrow -ct chest w contrast to look for mass -multiple myeloma panel -ASA 81, metoprolol 25 mg po BID, simvastatin 80 mg po daily  -norco/vicodin 1-2 tabs q6hprn   2. Productive Cough: Mr. Nanna notes that his cough has persisted and still is productive of thick grey phlegm. His WBC is slightly down at 13.8 from 15 with diff. His cough may also be related to reflux. Consider CAP v mycoplasma pneumonia which would not show up on CXR. Strep pneumo and legionella urine antigens negative. -cont azithromycin 500 mg iv daily, ceftriaxone 1 g iv daily -f/u sputum culture  -cont to monitor   3. Asymptomatic UTI: UA is nitrite positive with trace LE and  few granular casts. Patient is asymptomatic and UCx is pending. -f/u UCX -cont to monitor  4. HTN: Mr. Marcell BP is 110s/50-70s this morning and is well controlled.  -metoprolol 25 mg po BID, lisinopril 5 mg po daily, simvastatin 80 mg po daily  5. DM2: HgbA1c is 5.5 and glucose has been 100s-180s  -SSI  -cont to monitor  6. GERD: Patient does not believe his GERD is cause of this pain as he has had GERD for years and this feels different than his usual reflux.  -protonix 40 mg po daily  -GI cocktail QIDPRN  Dispo: Disposition is deferred at this time, awaiting improvement of current medical problems.  Anticipated discharge in approximately 2 day(s).   The patient does have a current PCP Delphina Cahill, MD) and does need an [facility] hospital follow-up appointment after discharge.  The patient does not have transportation limitations that hinder transportation to clinic appointments.  .Services Needed at time of discharge: Y = Yes, Blank = No PT:   OT:   RN:   Equipment:   Other:     LOS: 1 day   Kelby Aline, MD 03/14/2014, 2:23 PM

## 2014-03-14 NOTE — Progress Notes (Signed)
  Echocardiogram 2D Echocardiogram has been performed.  Mauricio Po 03/14/2014, 9:52 AM

## 2014-03-14 NOTE — Progress Notes (Signed)
  I have seen and examined the patient, and reviewed the daily progress note by Veryl Speak, MS 3 and discussed the care of the patient with them. Please see my progress note from 03/14/2014 for further details regarding assessment and plan.    Signed:  Kelby Aline, MD 03/14/2014, 2:24 PM

## 2014-03-14 NOTE — ED Provider Notes (Signed)
Medical screening examination/treatment/procedure(s) were performed by non-physician practitioner and as supervising physician I was immediately available for consultation/collaboration.   EKG Interpretation   Date/Time:  Sunday March 13 2014 08:26:34 EDT Ventricular Rate:  79 PR Interval:  156 QRS Duration: 87 QT Interval:  347 QTC Calculation: 398 R Axis:   35 Text Interpretation:  Age not entered, assumed to be  63 years old for  purpose of ECG interpretation Sinus rhythm Baseline wander in lead(s) III  since last tracing no significant change Confirmed by Jabri Blancett  MD, Flordia Kassem  ([id number]) on 03/13/2014 8:49:12 AM        Malvin Johns, MD 03/14/14 2018

## 2014-03-14 NOTE — H&P (Signed)
Attending physician admission note: I personally interviewed and examined this patient. History, physical findings,  presenting problems, evaluation, and management plan, accurate as recorded above by resident physician Dr. Lottie Mussel. Clinical summary: 62 year old man who has been in overall excellent health. He is a former smoker who stopped smoking over 30 years ago. He works in a [employer] and has been exposed to metal dust for many years. He developed atypical left pectoral chest pain in May of this year. He was evaluated at [facility]. CT angiogram done May 28 showed no evidence for pulmonary embolus, focal infiltrate or pleural effusion, no mass or adenopathy. There was a small pericardial effusion. Electrocardiogram was normal. Pain has a definite pleuritic component which has now worsened. It has not improved over the last 6 weeks. He has developed a hacking cough productive of gray sputum. No fevers. He admits to profuse night sweats. Poor appetite. Baseline weight 234 pounds. Current weight is the same. Chest x-ray shows an area of platelike atelectasis at the right base. Electrocardiogram normal sinus rhythm with no acute ischemic changes. Serial troponin levels are normal. He has a normochromic anemia with hemoglobin 12 g. Leukocytosis with white count 15,000 with 72% neutrophils, 14% lymphocytes, 13% monocytes. Current exam: Blood pressure 112/59, pulse 73, temperature 97.7 F (36.5 C), temperature source Oral, resp. rate 19, height 6' (1.829 m), weight 234 lb (106.142 kg), SpO2 98.00%. He appears well-nourished and in no acute distress. Lungs are clear and resonant to percussion. There is no cervical, supraclavicular, or axillary adenopathy. There is a regular cardiac rhythm without murmur gallop or rub. No carotid bruits. Abdomen is soft and nontender without mass or organomegaly. Extremities no edema, no calf tenderness. Impression: Atypical  left-sided right pleuritic chest pain and persistent cough in a nonsmoker with heavy exposure to metal dust. Despite absence of any fever, he has an elevated ESR, elevated white blood count, and a normochromic anemia. Plan: Etiology of his symptoms is not clear at this time. I would consider underlying malignancy in the differential. No focal findings on the CT scan of the chest done on May 28. We requested a cardiology evaluation. If this evaluation is unrevealing as I expect that it will be, I think we need to take a closer look at his chest again and repeat a CT scan as well as evaluate his normochromic anemia including but not limited to a multiple myeloma panel, to rule out a neoplastic process.  Murriel Hopper, MD, [place]  Hematology-Oncology/Internal Medicine

## 2014-03-15 ENCOUNTER — Encounter (HOSPITAL_COMMUNITY): Payer: BC Managed Care – PPO

## 2014-03-15 ENCOUNTER — Inpatient Hospital Stay (HOSPITAL_COMMUNITY): Payer: BC Managed Care – PPO

## 2014-03-15 DIAGNOSIS — R0789 Other chest pain: Secondary | ICD-10-CM

## 2014-03-15 DIAGNOSIS — R079 Chest pain, unspecified: Secondary | ICD-10-CM

## 2014-03-15 DIAGNOSIS — I4892 Unspecified atrial flutter: Secondary | ICD-10-CM | POA: Diagnosis not present

## 2014-03-15 DIAGNOSIS — I3139 Other pericardial effusion (noninflammatory): Secondary | ICD-10-CM | POA: Diagnosis present

## 2014-03-15 DIAGNOSIS — I313 Pericardial effusion (noninflammatory): Secondary | ICD-10-CM | POA: Diagnosis present

## 2014-03-15 DIAGNOSIS — J189 Pneumonia, unspecified organism: Secondary | ICD-10-CM | POA: Diagnosis present

## 2014-03-15 LAB — BASIC METABOLIC PANEL
ANION GAP: 14 (ref 5–15)
BUN: 19 mg/dL (ref 6–23)
CALCIUM: 9 mg/dL (ref 8.4–10.5)
CO2: 27 meq/L (ref 19–32)
CREATININE: 0.87 mg/dL (ref 0.50–1.35)
Chloride: 95 mEq/L — ABNORMAL LOW (ref 96–112)
GFR calc Af Amer: >90 mL/min (ref >90)
GFR, EST NON AFRICAN AMERICAN: 89 mL/min — AB (ref >90)
Glucose, Bld: 141 mg/dL — ABNORMAL HIGH (ref 70–99)
Potassium: 4.4 mEq/L (ref 3.7–5.3)
Sodium: 136 mEq/L — ABNORMAL LOW (ref 137–147)

## 2014-03-15 LAB — CBC
HEMATOCRIT: 34.5 % — AB (ref 39.0–52.0)
Hemoglobin: 11.2 g/dL — ABNORMAL LOW (ref 13.0–17.0)
MCH: 29.9 pg (ref 26.0–34.0)
MCHC: 32.5 g/dL (ref 30.0–36.0)
MCV: 92.2 fL (ref 78.0–100.0)
Platelets: 359 10*3/uL (ref 150–400)
RBC: 3.74 MIL/uL — ABNORMAL LOW (ref 4.22–5.81)
RDW: 15.4 % (ref 11.5–15.5)
WBC: 11.7 10*3/uL — ABNORMAL HIGH (ref 4.0–10.5)

## 2014-03-15 LAB — GLUCOSE, CAPILLARY
GLUCOSE-CAPILLARY: 151 mg/dL — AB (ref 70–99)
GLUCOSE-CAPILLARY: 103 mg/dL — AB (ref 70–99)
Glucose-Capillary: 142 mg/dL — ABNORMAL HIGH (ref 70–99)
Glucose-Capillary: 143 mg/dL — ABNORMAL HIGH (ref 70–99)

## 2014-03-15 MED ORDER — ASPIRIN 325 MG PO TABS
325.0000 mg | ORAL_TABLET | Freq: Every day | ORAL | Status: DC
  Administered 2014-03-15 – 2014-03-17 (×3): 325 mg via ORAL
  Filled 2014-03-15 (×3): qty 1

## 2014-03-15 MED ORDER — GUAIFENESIN-CODEINE 100-10 MG/5ML PO SOLN
10.0000 mL | ORAL | Status: DC | PRN
  Administered 2014-03-16 – 2014-03-17 (×2): 10 mL via ORAL
  Filled 2014-03-15 (×2): qty 10

## 2014-03-15 MED ORDER — ONDANSETRON HCL 4 MG/2ML IJ SOLN: 4.0000 mg | Freq: Four times a day (QID) | INTRAMUSCULAR | Status: DC | PRN

## 2014-03-15 MED ORDER — REGADENOSON 0.4 MG/5ML IV SOLN
INTRAVENOUS | Status: AC
  Administered 2014-03-15: 0.4 mg via INTRAVENOUS
  Filled 2014-03-15: qty 5

## 2014-03-15 MED ORDER — TECHNETIUM TC 99M SESTAMIBI GENERIC - CARDIOLITE
10.0000 | Freq: Once | INTRAVENOUS | Status: AC | PRN
  Administered 2014-03-15: 10 via INTRAVENOUS

## 2014-03-15 MED ORDER — REGADENOSON 0.4 MG/5ML IV SOLN
0.4000 mg | Freq: Once | INTRAVENOUS | Status: AC
  Administered 2014-03-15: 0.4 mg via INTRAVENOUS
  Filled 2014-03-15: qty 5

## 2014-03-15 MED ORDER — TECHNETIUM TC 99M SESTAMIBI GENERIC - CARDIOLITE
30.0000 | Freq: Once | INTRAVENOUS | Status: AC | PRN
  Administered 2014-03-15: 30 via INTRAVENOUS

## 2014-03-15 NOTE — Progress Notes (Signed)
Patient ID: Douglas Mason, male   DOB: July 08, 1951, 63 y.o.   MRN: [id number] Attending physician note: I examined this patient together with with medical resident Dr. Lottie Mussel and reviewed laboratory and diagnostic tests. Minimal to no chest pain. Persistent cough on antibiotics. No fever. Echocardiogram normal wall motion and valves. No systolic or diastolic dysfunction. CT chest with no gross pulmonary parenchymal abnormalities. Nonsignificant enlargement of a few mediastinal lymph nodes none greater than 10 mm likely reactive . There is unexplained cardiomegaly, persistent small pericardial effusion and bilateral small pleural effusions. He has a mild peripheral blood monocytosis. Blood film reviewed together with the medical resident and these appear to be benign. No other abnormalities noted on review, however, given the clinical picture, I am concerned that we are dealing with another underlying diagnosis that we have not yet arrived at. We will check a QuantiFERON test to exclude TB and a mycoplasma titer. It is still possible that we are dealing with a viral illness. I cannot exclude the possibility of an underlying occult malignancy and if the remainder of the evaluation is unrevealing, we may need to get pulmonary medicine involved for bronchoscopy to exclude an endobronchial lesion.

## 2014-03-15 NOTE — Progress Notes (Signed)
Pt had burst of aflutter, HR increased to 140s. Pt asymptomatic, lying in bed. Lurena Joiner, [place] with cardiology on floor and made aware. No new orders received. Will continue to monitor.

## 2014-03-15 NOTE — Sedation Documentation (Signed)
Pt states he is unable to walk on treadmill for stress test. Changed to lexiscan

## 2014-03-15 NOTE — Progress Notes (Signed)
Subjective: Douglas Mason is a 63yo male with a past medical history of T2DM, HTN, and GERD on hospital day 3. He initially presented to the ED with a stabbing chest pain, pleuritic chest pain, and a cough. He had no acute events overnight. Reports that his chest pain is improved, but he continues to experience cough, shortness of breath, and decreased appetite. He denies any fever, chills,or diaphoresis.  Objective: Vital signs in last 24 hours: Filed Vitals:   03/15/14 0844 03/15/14 0906 03/15/14 0908 03/15/14 0910  BP: 138/68 143/73 143/64 142/60  Pulse: 87 109 104 99  Temp:      TempSrc:      Resp:      Height:      Weight:      SpO2:       Weight change: -0.033 kg (-1.2 oz)  Intake/Output Summary (Last 24 hours) at 03/15/14 1115 Last data filed at 03/14/14 1309  Gross per 24 hour  Intake    240 ml  Output      0 ml  Net    240 ml   General: resting in bed HEENT: PERRL, EOMI, no scleral icterus Cardiac: RRR Pulm: clear to auscultation bilaterally Abd: soft, nontender, nondistended, BS present Ext: warm and well perfused, no pedal edema Neuro: alert and oriented X3  Lab Results: CBC Latest Ref Rng 03/15/2014 03/14/2014 03/13/2014  WBC 4.0 - 10.5 K/uL 11.7(H) 13.8(H) 15.0(H)  Hemoglobin 13.0 - 17.0 g/dL 11.2(L) 11.5(L) 11.8(L)  Hematocrit 39.0 - 52.0 % 34.5(L) 35.5(L) 35.5(L)  Platelets 150 - 400 K/uL 359 314 298   BMET    Component Value Date/Time   NA 136* 03/15/2014 0510   K 4.4 03/15/2014 0510   CL 95* 03/15/2014 0510   CO2 27 03/15/2014 0510   GLUCOSE 141* 03/15/2014 0510   BUN 19 03/15/2014 0510   CREATININE 0.87 03/15/2014 0510   CALCIUM 9.0 03/15/2014 0510   GFRNONAA 89* 03/15/2014 0510   GFRAA >90 03/15/2014 0510   Urinalysis    Component Value Date/Time   COLORURINE YELLOW 03/13/2014 1115   APPEARANCEUR CLEAR 03/13/2014 1115   LABSPEC 1.031* 03/13/2014 1115   PHURINE 5.0 03/13/2014 1115   GLUCOSEU NEGATIVE 03/13/2014 1115   HGBUR NEGATIVE 03/13/2014 1115   BILIRUBINUR MODERATE* 03/13/2014 1115   KETONESUR 15* 03/13/2014 1115   PROTEINUR 100* 03/13/2014 1115   UROBILINOGEN 4.0* 03/13/2014 1115   NITRITE POSITIVE* 03/13/2014 1115   LEUKOCYTESUR TRACE* 03/13/2014 1115   Micro Results: Recent Results (from the past 240 hour(s))  URINE CULTURE     Status: None   Collection Time    03/13/14 11:15 AM      Result Value Ref Range Status   Specimen Description URINE, RANDOM   Final   Special Requests NONE   Final   Culture  Setup Time     Final   Value: 03/13/2014 23:25     Performed at [place]     Final   Value: NO GROWTH     Performed at [facility]   Culture     Final   Value: NO GROWTH     Performed at [facility]   Report Status 03/14/2014 FINAL   Final   Peripheral blood smear: benign monocytosis  Sputum culture pending  Multiple myeloma panel pending  Studies/Results: Dg Chest 2 View  03/13/2014   CLINICAL DATA:  Productive cough x4 days  EXAM: CHEST  2 VIEW  COMPARISON:  Chest radiograph 03/13/2014  FINDINGS: Stable enlarged cardiac silhouette. There is right lower lobe opacity. Mild central venous congestion. Trace effusions.  IMPRESSION: Right lower lobe infiltrate versus atelectasis.   Electronically Signed   By: Suzy Bouchard M.D.   On: 03/13/2014 12:46   Ct Chest W Contrast  03/14/2014   CLINICAL DATA:  Chest pain  EXAM: CT CHEST WITH CONTRAST  TECHNIQUE: Multidetector CT imaging of the chest was performed during intravenous contrast administration.  CONTRAST:  177m OMNIPAQUE IOHEXOL 300 MG/ML  SOLN  COMPARISON:  CTA chest dated 01/27/2014. Chest radiographs dated 03/13/2014.  FINDINGS: Evaluation of the lung parenchyma is constrained by respiratory motion. No suspicious pulmonary nodules.  Small left and trace right pleural effusions. Associated compressive atelectasis in the bilateral lower lobes. No frank interstitial edema.  Mild eventration of the right hemidiaphragm with  associated mild compressive atelectasis in the right middle lobe, likely accounting for the radiographic abnormality.  No pneumothorax.  Visualized thyroid is unremarkable.  Cardiomegaly. Small pericardial effusion. Coronary atherosclerosis.  Small mediastinal lymph nodes, including a 10 mm short axis right paratracheal node (series 2/ image 13), likely reactive.  Visualized upper abdomen there is notable for cholelithiasis.  Degenerative changes of the visualized thoracolumbar spine.  IMPRESSION: Small left and trace right pleural effusions. No frank interstitial edema.  Eventration of the right hemidiaphragm with mild compressive atelectasis in the right middle lobe, likely accounting for the radiographic abnormality.  Mild bibasilar atelectasis.  Cardiomegaly with small pericardial effusion.   Electronically Signed   By: SJulian HyM.D.   On: 03/14/2014 16:49   7/13 2D ECHO: Left ventricle: The cavity size was normal. Systolic function was normal. The estimated ejection fraction was in the range of 55% to 60%. Wall motion was normal; there were no regional wall motion abnormalities. Left ventricular diastolic function parameters were normal.   Medications: I have reviewed the patient's current medications. Scheduled Meds: . azithromycin  500 mg Intravenous Q24H  . cefTRIAXone (ROCEPHIN)  IV  1 g Intravenous Q24H  . heparin  5,000 Units Subcutaneous 3 times per day  . insulin aspart  0-15 Units Subcutaneous TID WC  . lisinopril  5 mg Oral Daily  . metoprolol tartrate  25 mg Oral BID  . pantoprazole  40 mg Oral Daily  . simvastatin  80 mg Oral q1800   Continuous Infusions:  PRN Meds:.acetaminophen, gi cocktail, guaiFENesin-codeine, HYDROcodone-acetaminophen, morphine injection, ondansetron (ZOFRAN) IV Assessment/Plan: Principal Problem:   Chest pain with moderate risk of acute coronary syndrome Active Problems:   HTN (hypertension)   Diabetes   GERD (gastroesophageal reflux  disease)   CAP (community acquired pneumonia)   Atrial flutter   Pericardial effusion by CT, not seen by echo  1. Chest pain The origin of Mr. Nick's chest pain is not yet determined. Per cardiology, likely not anginal pain.  MI is unlikely given normal EKG and normal troponin x3. Aortic dissection is unlikely given chronic course of pain and does not endorse tearing pain, no widened mediastinum on CXR.  PE is unlikely given lack of DVT symptoms (swollen calf, pitting edema, pain, warmth, localized tenderness), risk factors (hypercoagulable state, immobilization, smoking).  Pericarditis (viral/bacterial)/myopericarditis unlikely given lack of EKG findings.  Pneumonia: Mycoplasma pneumonia possible considering the chronic nature of the pain, the productive cough, the high white count, and the normal CXR ESR of 66 (7/12). Echocardiogram performed 7/13 and showed no abnormalities. CTA (01/27/14) showed no acute PE, trace pericardial effusion, and mild coronary calcification,  unlikely to be coronary in etiology. Peripheral blood smear with benign monocytosis which is nonspecific. Malignancy should be considered. -stress myoview performed today (7/4); results pending; f/u   -ANA ordered, results pending -continue telemetry  -Lisinopril 60m PO daily  -Metoprolol tartrate 241mPO twice daily  -Simvastatin 8077mO daily  -Norco/Vicodin PRN pain  -Morphine PRN pain  -Zofran PRN nausea  -DVT prophylaxis  2. Cough Continued cough that is productive of thick grey phlegm. Due to chest x ray suggestive of right lower lobe infiltrate, high WBC, and pleuritic chest pain, presumed community acquired pneumonia. Begun on Ceftriaxone/Azithromycin for CAP and given guaiFENesin-codeine for cough suppression. WBC has come down to 11.7 from 13.8 (7/13) and from 15.0 (7/12). CT did not reveal a pneumonia, but continue with Ceftriaxone/Azithromycin to cover for mycoplasma. Blood smear monocytosis may suggest a  granulomatous pathology, but this is nonspecific. Strep pneumo and legionella urine antigens were negative. -Ceftriaxone/Azithromycin every 24 hours  -guaiFENesin-codeine PRN cough  -sputum culture pending -quantiferon gold assay ordered, results pending -PFTs ordered, results pending -IgM mycoplasma ordered, results pending -consider pulmonary consult  3. Hypertension  110s-140s/50s-70s, well controlled.  -metoprolol tartrate 5m76msimvastatin 80mg21meart healthy/carb modified diet   4. Diabetes mellitus type II  Hemoglobin A1c 7/12: 5.5%. Sliding scale insulin has been ordered for duration of hospital stay.  -continue novoLOG  -CBG monitoring   5. Gastroesophageal reflux disease  Stable for many years, per patient.  -Protonix 40mg 40maily   6. Normocytic anemia with monocytosis There are many potential etiologies of monocytosis. This patient could have an infection (tuberculosis), autoimmune condition, or a myelodysplastic disorder. -multiple myeloma panel ordered, results pending -quantiferon gold ordered, results pending -ANA ordered, results pending  7. Asymptomatic UTI UA is nitrite positive with trace LE and few granular casts. He is asymptomatic and the urine culture is negative. -monitor for symptoms  Dispo: Disposition is deferred at this time, awaiting improvement of current medical problems.  Anticipated discharge in approximately 2 day(s).   The patient does have a current PCP (Zach Delph[facility]  The patient does not have transportation limitations that hinder transportation to clinic appointments.  .Services Needed at time of discharge: Y = Yes, Blank = No PT:   OT:   RN:   Equipment:   Other:     LOS: 2 days   KathryAlvina ChouStudent 03/15/2014, 11:15 AM

## 2014-03-15 NOTE — Progress Notes (Signed)
Pt given lexiscan injection with Lorretta Harp PA present

## 2014-03-15 NOTE — Progress Notes (Signed)
    Subjective:  No complaints, he had Myoview earlier this am.   Objective:  Vital Signs in the last 24 hours: Temp:  [97.7 F (36.5 C)-98.9 F (37.2 C)] 98.9 F (37.2 C) (07/14 0415) Pulse Rate:  [73-109] 99 (07/14 0910) Resp:  [19-20] 20 (07/14 0415) BP: (112-143)/(59-73) 142/60 mmHg (07/14 0910) SpO2:  [92 %-98 %] 92 % (07/14 0415) Weight:  [233 lb 14.9 oz (106.109 kg)] 233 lb 14.9 oz (106.109 kg) (07/14 0415)  Intake/Output from previous day:  Intake/Output Summary (Last 24 hours) at 03/15/14 1113 Last data filed at 03/14/14 1309  Gross per 24 hour  Intake    240 ml  Output      0 ml  Net    240 ml    Physical Exam: General appearance: alert, cooperative, no distress and moderately obese Lungs: decreased at bases Heart: regular rate and rhythm   Rate: 80  Rhythm: normal sinus rhythm and run of Atrial flutter this am, atrial bigimeny as well  Lab Results:  Recent Labs  03/14/14 0540 03/15/14 0510  WBC 13.8* 11.7*  HGB 11.5* 11.2*  PLT 314 359    Recent Labs  03/14/14 0540 03/15/14 0510  NA 135* 136*  K 4.8 4.4  CL 95* 95*  CO2 25 27  GLUCOSE 139* 141*  BUN 19 19  CREATININE 0.92 0.87    Recent Labs  03/13/14 1828 03/13/14 2042  TROPONINI <0.30 <0.30   No results found for this basename: INR,  in the last 72 hours  Imaging: Imaging results have been reviewed  Cardiac Studies: Echo 03/14/14 Study Conclusions  - Left ventricle: The cavity size was normal. Systolic function was normal. The estimated ejection fraction was in the range of 55% to 60%. Wall motion was normal; there were no regional wall motion abnormalities. Left ventricular diastolic function parameters were normal.   Assessment/Plan:  63 yo from [place] without prior cardiac history, admitted 03/13/14 with chest pain. He has risk factors as noted below. Myoview done this am- results pending. He did have PAF/ flutter on telelmetry this am- less than 5 minutes.      Principal Problem:   Chest pain with moderate risk of acute coronary syndrome Active Problems:   HTN (hypertension)   Diabetes   CAP (community acquired pneumonia)   Atrial flutter   GERD (gastroesophageal reflux disease)   Pericardial effusion by CT, not seen by echo    PLAN: On antibiotics for suspected pneumonia. No pericardial effusion seen on 2D. Myoview pending. Atrilal flutter this am, suspect he has sleep apnea. MD to see, continue beta blocker, add ASA.   Kerin Ransom PA-C Beeper [phone] 03/15/2014, 11:13 AM  The patient had Myoview stress test earlier today .  Results are pending.  Patient complains of nausea following the test.  He states his chest pain has resolved.  He is still coughing up thick sputum. Plan: No further cardiac workup if Myoview is negative.  Continue to treat for bronchitis with resolving pleurisy.  Consider sleep apnea workup as outpatient.  Rhythm reveals normal sinus rhythm with atrial bigeminy.  Continue low-dose beta blocker.

## 2014-03-15 NOTE — Progress Notes (Signed)
Subjective: Douglas Mason was interviewed with the team following his myoview stress test. He was uncomfortable following his test. He reported his baseline SOB and that his cough is unchanged. He denied any chest pain, fever, chills, night sweats, palpitations, abdominal pain.  Objective: Vital signs in last 24 hours: Filed Vitals:   03/15/14 0844 03/15/14 0906 03/15/14 0908 03/15/14 0910  BP: 138/68 143/73 143/64 142/60  Pulse: 87 109 104 99  Temp:      TempSrc:      Resp:      Height:      Weight:      SpO2:       Weight change: -1.2 oz (-0.033 kg)  Intake/Output Summary (Last 24 hours) at 03/15/14 1126 Last data filed at 03/14/14 1309  Gross per 24 hour  Intake    240 ml  Output      0 ml  Net    240 ml   BP 142/60  Pulse 99  Temp(Src) 98.9 F (37.2 C) (Oral)  Resp 20  Ht 6' (1.829 m)  Wt 233 lb 14.9 oz (106.109 kg)  BMI 31.72 kg/m2  SpO2 92%  General Appearance:    Alert, cooperative, no distress, appears stated age  HEENT:    Normocephalic, without obvious abnormality, atraumatic, PERRL, moist mucous membranes  Back:     Symmetric, no curvature  Lungs:     Clear to auscultation bilaterally, respirations unlabored  Chest wall:    No tenderness or deformity  Heart:    Regular rate and rhythm, S1 and S2 normal, no murmur, rub   or gallop, no carotid bruits  Abdomen:     Soft, non-tender, bowel sounds active all four quadrants,    no masses, no organomegaly  Extremities:   Extremities normal, atraumatic, no cyanosis or edema  Pulses:   2+ and symmetric radial, DP, PT pulses   Lab Results: Basic Metabolic Panel:  Recent Labs Lab 03/14/14 0540 03/15/14 0510  NA 135* 136*  K 4.8 4.4  CL 95* 95*  CO2 25 27  GLUCOSE 139* 141*  BUN 19 19  CREATININE 0.92 0.87  CALCIUM 8.9 9.0   Liver Function Tests:  Recent Labs Lab 03/13/14 0828  AST 30  ALT 47  ALKPHOS 73  BILITOT 1.3*  PROT 7.7  ALBUMIN 3.7    Recent Labs Lab 03/13/14 0911  LIPASE 29    No results found for this basename: AMMONIA,  in the last 168 hours CBC:  Recent Labs Lab 03/13/14 0828 03/14/14 0540 03/15/14 0510  WBC 15.0* 13.8* 11.7*  NEUTROABS 10.7*  --   --   HGB 11.8* 11.5* 11.2*  HCT 35.5* 35.5* 34.5*  MCV 91.7 94.7 92.2  PLT 298 314 359   Cardiac Enzymes:  Recent Labs Lab 03/13/14 1425 03/13/14 1828 03/13/14 2042  TROPONINI <0.30 <0.30 <0.30   BNP:  Recent Labs Lab 03/13/14 0911  PROBNP 190.3*   D-Dimer: No results found for this basename: DDIMER,  in the last 168 hours CBG:  Recent Labs Lab 03/14/14 0724 03/14/14 1133 03/14/14 1630 03/14/14 2124 03/15/14 0715 03/15/14 1021  GLUCAP 125* 184* 110* 123* 142* 143*   Hemoglobin A1C:  Recent Labs Lab 03/13/14 1425  HGBA1C 5.5   Fasting Lipid Panel:  Recent Labs Lab 03/13/14 1425  CHOL 122  HDL 34*  LDLCALC 74  TRIG 68  CHOLHDL 3.6   Thyroid Function Tests:  Recent Labs Lab 03/13/14 1425  TSH 1.330   Coagulation: No  results found for this basename: LABPROT, INR,  in the last 168 hours Anemia Panel: No results found for this basename: VITAMINB12, FOLATE, FERRITIN, TIBC, IRON, RETICCTPCT,  in the last 168 hours Urine Drug Screen: Drugs of Abuse     Component Value Date/Time   LABOPIA POSITIVE* 03/13/2014 1115   COCAINSCRNUR NONE DETECTED 03/13/2014 1115   LABBENZ NONE DETECTED 03/13/2014 1115   AMPHETMU NONE DETECTED 03/13/2014 1115   THCU NONE DETECTED 03/13/2014 1115   LABBARB NONE DETECTED 03/13/2014 1115    Alcohol Level: No results found for this basename: ETH,  in the last 168 hours Urinalysis:  Recent Labs Lab 03/13/14 1115  COLORURINE YELLOW  LABSPEC 1.031*  PHURINE 5.0  GLUCOSEU NEGATIVE  HGBUR NEGATIVE  BILIRUBINUR MODERATE*  KETONESUR 15*  PROTEINUR 100*  UROBILINOGEN 4.0*  NITRITE POSITIVE*  LEUKOCYTESUR TRACE*    Micro Results: Recent Results (from the past 240 hour(s))  URINE CULTURE     Status: None   Collection Time     03/13/14 11:15 AM      Result Value Ref Range Status   Specimen Description URINE, RANDOM   Final   Special Requests NONE   Final   Culture  Setup Time     Final   Value: 03/13/2014 23:25     Performed at [facility]     Final   Value: NO GROWTH     Performed at [facility]   Culture     Final   Value: NO GROWTH     Performed at [facility]   Report Status 03/14/2014 FINAL   Final   Peripheral blood smear: benign monocytosis  sputum culture pending Multiple myeloma panel pending   Studies/Results: Dg Chest 2 View  03/13/2014   CLINICAL DATA:  Productive cough x4 days  EXAM: CHEST  2 VIEW  COMPARISON:  Chest radiograph 03/13/2014  FINDINGS: Stable enlarged cardiac silhouette. There is right lower lobe opacity. Mild central venous congestion. Trace effusions.  IMPRESSION: Right lower lobe infiltrate versus atelectasis.   Electronically Signed   By: Suzy Bouchard M.D.   On: 03/13/2014 12:46   Ct Chest W Contrast  03/14/2014   CLINICAL DATA:  Chest pain  EXAM: CT CHEST WITH CONTRAST  TECHNIQUE: Multidetector CT imaging of the chest was performed during intravenous contrast administration.  CONTRAST:  168m OMNIPAQUE IOHEXOL 300 MG/ML  SOLN  COMPARISON:  CTA chest dated 01/27/2014. Chest radiographs dated 03/13/2014.  FINDINGS: Evaluation of the lung parenchyma is constrained by respiratory motion. No suspicious pulmonary nodules.  Small left and trace right pleural effusions. Associated compressive atelectasis in the bilateral lower lobes. No frank interstitial edema.  Mild eventration of the right hemidiaphragm with associated mild compressive atelectasis in the right middle lobe, likely accounting for the radiographic abnormality.  No pneumothorax.  Visualized thyroid is unremarkable.  Cardiomegaly. Small pericardial effusion. Coronary atherosclerosis.  Small mediastinal lymph nodes, including a 10 mm short axis right paratracheal node (series 2/  image 13), likely reactive.  Visualized upper abdomen there is notable for cholelithiasis.  Degenerative changes of the visualized thoracolumbar spine.  IMPRESSION: Small left and trace right pleural effusions. No frank interstitial edema.  Eventration of the right hemidiaphragm with mild compressive atelectasis in the right middle lobe, likely accounting for the radiographic abnormality.  Mild bibasilar atelectasis.  Cardiomegaly with small pericardial effusion.   Electronically Signed   By: SJulian HyM.D.   On: 03/14/2014 16:49  7/13 2D echo with contrast Left ventricle: The cavity size was normal. Systolic function was normal. The estimated ejection fraction was in the range of 55% to 60%. Wall motion was normal; there were no regional wall motion abnormalities. Left ventricular diastolic function parameters were normal.   7/14 myoview stress test pending  Medications: I have reviewed the patient's current medications. Scheduled Meds: . aspirin  325 mg Oral Daily  . azithromycin  500 mg Intravenous Q24H  . cefTRIAXone (ROCEPHIN)  IV  1 g Intravenous Q24H  . heparin  5,000 Units Subcutaneous 3 times per day  . insulin aspart  0-15 Units Subcutaneous TID WC  . lisinopril  5 mg Oral Daily  . metoprolol tartrate  25 mg Oral BID  . pantoprazole  40 mg Oral Daily  . simvastatin  80 mg Oral q1800   Continuous Infusions:  PRN Meds:.acetaminophen, gi cocktail, guaiFENesin-codeine, HYDROcodone-acetaminophen, morphine injection, ondansetron (ZOFRAN) IV Assessment/Plan: Principal Problem:   Chest pain with moderate risk of acute coronary syndrome Active Problems:   HTN (hypertension)   Diabetes   GERD (gastroesophageal reflux disease)   CAP (community acquired pneumonia)   Atrial flutter   Pericardial effusion by CT, not seen by echo  1. Atypical Chest Pain: The etiology of Douglas Mason's chest pain is unclear. Troponin negative X 3 and EKG wnl with only PVC noted on cardiac  monitoring although it is left sided, radiating to neck, and associated with diaphoresis in terms of ACS. UDS only positive for opiates he reported taking for this pain. PE is unlikely as he is not tachycardic with no calf tenderness on exam and no known risk factors. He denies any trauma or chest wall tenderness to palpation in terms of costochondritis. He says this is different than his GERD pain. EKG did not have findings associated with pericarditis and the echo did not demonstrate pericarditis. He is being treated for a possible CAP as his CXR showed RLL infiltrate v atelectasis. Mycoplasma pneumonia is possible as well. CT did not show any consolidation or mass although there were mildly reactive mediastinal lymph nodes. Occupational exposure from [employer] is a consideration (pulmonary fibrosis not seen on CT, consider pulmonary alveolar proteinosis). Peripheral blood smear with benign monocytosis which is nonspecific. Malignancy should also be considered. He is followed by cards who performed a myoview stress test this morning. -appreciate cards -telemetry monitoring  -f/u stress myoview -f/u multiple myeloma panel -PFT -ANA -mycoplasma pneumoniae IgM antibody -quantiferon -ASA 81, metoprolol 25 mg po BID, simvastatin 80 mg po daily  -norco/vicodin 1-2 tabs q6hprn   2. Productive Cough: See above. Douglas Mason notes that his cough has persisted and still is productive of thick grey phlegm. His WBC is slightly down at 11.7 from 15 two days ago. His cough may also be related to reflux. Chest CT demonstrated small trace L and R pleural effusions but no consolidation or mass. Mycoplasma remains on differential. Blood smear monocytosis may suggest granulomatous pathology but is nonspecific. Strep pneumo and legionella urine antigens negative. -cont azithromycin 500 mg iv daily, ceftriaxone 1 g iv daily -f/u sputum culture  -cont to monitor  -consider pulmonary consult  3. Asymptomatic  UTI: UA is nitrite positive with trace LE and few granular casts. Patient is asymptomatic and UCx negative -cont to monitor for symptoms  4. HTN: Douglas Mason BP is 110-140s/50-70s this morning and is well controlled.  -metoprolol 25 mg po BID, lisinopril 5 mg po daily, simvastatin 80 mg po daily  5. DM2: HgbA1c is 5.5 and glucose has been 110s-140s  -SSI  -cont to monitor  6. GERD: Patient does not believe his GERD is cause of this pain as he has had GERD for years and this feels different than his usual reflux.  -protonix 40 mg po daily  -GI cocktail QIDPRN  Dispo: Disposition is deferred at this time, awaiting improvement of current medical problems.  Anticipated discharge in approximately 2 day(s).   The patient does have a current PCP Delphina Cahill, MD) and does need an [facility] hospital follow-up appointment after discharge.  The patient does not have transportation limitations that hinder transportation to clinic appointments.  .Services Needed at time of discharge: Y = Yes, Blank = No PT:   OT:   RN:   Equipment:   Other:     LOS: 2 days   Kelby Aline, MD 03/15/2014, 11:26 AM

## 2014-03-15 NOTE — Progress Notes (Signed)
  I have seen and examined the patient myself, and I have reviewed the note by Veryl Speak, MS3 and was present during the interview and physical exam.  Please see my separate H&P for additional findings, assessment, and plan.   Signed: Kelby Aline, MD 03/15/2014, 1:30 PM

## 2014-03-16 ENCOUNTER — Inpatient Hospital Stay (HOSPITAL_COMMUNITY): Payer: BC Managed Care – PPO

## 2014-03-16 DIAGNOSIS — D649 Anemia, unspecified: Secondary | ICD-10-CM

## 2014-03-16 DIAGNOSIS — D72821 Monocytosis (symptomatic): Secondary | ICD-10-CM

## 2014-03-16 DIAGNOSIS — N390 Urinary tract infection, site not specified: Secondary | ICD-10-CM

## 2014-03-16 DIAGNOSIS — R05 Cough: Secondary | ICD-10-CM

## 2014-03-16 DIAGNOSIS — R058 Other specified cough: Secondary | ICD-10-CM

## 2014-03-16 DIAGNOSIS — J189 Pneumonia, unspecified organism: Secondary | ICD-10-CM

## 2014-03-16 LAB — BASIC METABOLIC PANEL
ANION GAP: 13 (ref 5–15)
BUN: 14 mg/dL (ref 6–23)
CO2: 28 meq/L (ref 19–32)
Calcium: 9 mg/dL (ref 8.4–10.5)
Chloride: 97 mEq/L (ref 96–112)
Creatinine, Ser: 0.8 mg/dL (ref 0.50–1.35)
GFR calc Af Amer: >90 mL/min (ref >90)
Glucose, Bld: 141 mg/dL — ABNORMAL HIGH (ref 70–99)
Potassium: 4.3 mEq/L (ref 3.7–5.3)
SODIUM: 138 meq/L (ref 137–147)

## 2014-03-16 LAB — GLUCOSE, CAPILLARY
GLUCOSE-CAPILLARY: 86 mg/dL (ref 70–99)
GLUCOSE-CAPILLARY: 122 mg/dL — AB (ref 70–99)
Glucose-Capillary: 201 mg/dL — ABNORMAL HIGH (ref 70–99)
Glucose-Capillary: 115 mg/dL — ABNORMAL HIGH (ref 70–99)

## 2014-03-16 LAB — PULMONARY FUNCTION TEST
DL/VA % pred: 117 %
DL/VA: 5.48 ml/min/mmHg/L
DLCO COR % PRED: 64 %
DLCO COR: 21.84 ml/min/mmHg
DLCO UNC % PRED: 57 %
DLCO unc: 19.33 ml/min/mmHg
FEF 25-75 PRE: 2.07 L/s
FEF 25-75 Post: 1.18 L/sec
FEF2575-%CHANGE-POST: -43 %
FEF2575-%PRED-PRE: 73 %
FEF2575-%Pred-Post: 41 %
FEV1-%Change-Post: -13 %
FEV1-%Pred-Post: 54 %
FEV1-%Pred-Pre: 62 %
FEV1-PRE: 2.24 L
FEV1-Post: 1.94 L
FEV1FVC-%Change-Post: 3 %
FEV1FVC-%PRED-PRE: 105 %
FEV6-%CHANGE-POST: -15 %
FEV6-%PRED-POST: 52 %
FEV6-%Pred-Pre: 61 %
FEV6-POST: 2.37 L
FEV6-Pre: 2.81 L
FEV6FVC-%CHANGE-POST: 0 %
FEV6FVC-%PRED-PRE: 104 %
FEV6FVC-%Pred-Post: 105 %
FVC-%Change-Post: -16 %
FVC-%Pred-Post: 49 %
FVC-%Pred-Pre: 58 %
FVC-PRE: 2.82 L
FVC-Post: 2.37 L
POST FEV1/FVC RATIO: 82 %
Post FEV6/FVC ratio: 100 %
Pre FEV1/FVC ratio: 79 %
Pre FEV6/FVC Ratio: 100 %
RV % pred: 83 %
RV: 2 L
TLC % pred: 67 %
TLC: 4.84 L

## 2014-03-16 LAB — CBC WITH DIFFERENTIAL/PLATELET
BASOS ABS: 0 10*3/uL (ref 0.0–0.1)
Basophils Relative: 0 % (ref 0–1)
EOS PCT: 3 % (ref 0–5)
Eosinophils Absolute: 0.3 10*3/uL (ref 0.0–0.7)
HCT: 31.8 % — ABNORMAL LOW (ref 39.0–52.0)
Hemoglobin: 11.1 g/dL — ABNORMAL LOW (ref 13.0–17.0)
LYMPHS ABS: 1.6 10*3/uL (ref 0.7–4.0)
LYMPHS PCT: 16 % (ref 12–46)
MCH: 30.8 pg (ref 26.0–34.0)
MCHC: 34.9 g/dL (ref 30.0–36.0)
MCV: 88.3 fL (ref 78.0–100.0)
MONOS PCT: 15 % — AB (ref 3–12)
Monocytes Absolute: 1.5 10*3/uL — ABNORMAL HIGH (ref 0.1–1.0)
Neutro Abs: 6.3 10*3/uL (ref 1.7–7.7)
Neutrophils Relative %: 66 % (ref 43–77)
PLATELETS: 362 10*3/uL (ref 150–400)
RBC: 3.6 MIL/uL — AB (ref 4.22–5.81)
RDW: 14.9 % (ref 11.5–15.5)
WBC: 9.7 10*3/uL (ref 4.0–10.5)

## 2014-03-16 LAB — MULTIPLE MYELOMA PANEL, SERUM
ALBUMIN ELP: 49.8 % — AB (ref 55.8–66.1)
ALPHA-1-GLOBULIN: 7 % — AB (ref 2.9–4.9)
Alpha-2-Globulin: 13.3 % — ABNORMAL HIGH (ref 7.1–11.8)
BETA GLOBULIN: 6.5 % (ref 4.7–7.2)
Beta 2: 6.8 % — ABNORMAL HIGH (ref 3.2–6.5)
Gamma Globulin: 16.6 % (ref 11.1–18.8)
IGA: 158 mg/dL (ref 68–379)
IGM, SERUM: 90 mg/dL (ref 41–251)
IgG (Immunoglobin G), Serum: 1130 mg/dL (ref 650–1600)
M-Spike, %: NOT DETECTED g/dL
TOTAL PROTEIN: 6.6 g/dL (ref 6.0–8.3)

## 2014-03-16 LAB — ANA: Anti Nuclear Antibody(ANA): NEGATIVE

## 2014-03-16 MED ORDER — ALBUTEROL SULFATE (2.5 MG/3ML) 0.083% IN NEBU
2.5000 mg | INHALATION_SOLUTION | Freq: Once | RESPIRATORY_TRACT | Status: AC
  Administered 2014-03-16: 2.5 mg via RESPIRATORY_TRACT

## 2014-03-16 MED ORDER — AZITHROMYCIN 500 MG PO TABS
500.0000 mg | ORAL_TABLET | ORAL | Status: DC
  Administered 2014-03-16: 500 mg via ORAL
  Filled 2014-03-16 (×2): qty 1

## 2014-03-16 MED ORDER — PANTOPRAZOLE SODIUM 40 MG PO TBEC
40.0000 mg | DELAYED_RELEASE_TABLET | Freq: Two times a day (BID) | ORAL | Status: DC
  Administered 2014-03-16 – 2014-03-17 (×2): 40 mg via ORAL
  Filled 2014-03-16 (×2): qty 1

## 2014-03-16 NOTE — Consult Note (Signed)
Name: Douglas Mason MRN: [id number] DOB: 1951/08/08    ADMISSION DATE:  03/13/2014 CONSULTATION DATE:  03/16/14  REFERRING MD :  Dr. Beryle Beams  PRIMARY SERVICE:  Teaching Service   CHIEF COMPLAINT:  Cough, Pleural Effusion, LAN   BRIEF PATIENT DESCRIPTION: 63 y/o M with PMH of HTN, GERD, DM admitted on 7/12 with a 6 wk history of atypical chest pain.  He developed cough and PCCM consulted for further evaluation.  SIGNIFICANT EVENTS: 12/2013 - Seen at [facility] in May for same with neg work up for PE / MI,   7/12 - Admit with atypical L chest pain.    STUDIES:  7/13 - CT Chest >> motion artifact, no pulmonary nodules, small left effusion, trace right, no edema, no endobronchial lesions noted, small pericardial effusion 7/13 - ECHO >> EF 81-44%, nml systolic function, no wm abnormalities 7/15 PFT (prelim) FVC (L) 2.82, 58%;  FEV1 (L) 2.24,  62%;FEV1/FVC (%)105;  SVC (L) 59%;DLCOunc (ml/min/mmHg) 57. Although the FEV1 and FVC are reduced, the FEV1/FVC ratio is increased. The airway resistance is increased. The lung volumes are reduced. Following administration of bronchodilators, there is no significant response. The reduced diffusing capacity indicates a mild loss of functional alveolar capillary surface. Conclusions: The reduced lung volumes, increased FEV1/FVC ratio and diffusion defect suggest an interstitial process  CULTURES: UC 7/12 >> neg  ANTIBIOTICS: Azithro 7/12 >>   HISTORY OF PRESENT ILLNESS:   63 y/o M with PMH of HTN, GERD, DM.  Presents to [facility] on 7/12  w/  6 wk history of atypical chest pain and over the last 2 days prior to admit developed cough with productive grey sputum, night sweats, loss of appetite, palpitations and nausea.  IInitial work up notable for negative troponin x3, BNP 190, ESR 66, WBC 15, Hgb 11.8.  Seen by cardiology and felt to be non-cardiac in nature. CT of chest showed small L>R effusions and basilar atx. Currently on azithro for possible CAP.  Urine antigen studies are negative. His chest pain has resolved over the last 24-48 hrs, but he continues to have cough. Pulmonary consult has been requested given concern about on-going cough in setting of what should be appropriate antibiotic treatment, and possible concern for occult malignancy as etiology of cough. Of note he has a h/o reflux, and just stopped Lisinopril on 7/9.    PAST MEDICAL HISTORY :  Past Medical History  Diagnosis Date  . Hypertension   . Diabetes mellitus without complication   . GERD (gastroesophageal reflux disease)    History reviewed. No pertinent past surgical history. Prior to Admission medications   Medication Sig Start Date End Date Taking? Authorizing Provider  ALPRAZolam Duanne Moron) 0.5 MG tablet Take 1 tablet by mouth 2 (two) times daily as needed. anxiety 01/12/14  Yes Historical Provider, MD  aspirin EC 81 MG tablet Take 81 mg by mouth daily.   Yes Historical Provider, MD  HYDROcodone-acetaminophen (NORCO/VICODIN) 5-325 MG per tablet Take 1-2 tablets by mouth every 6 (six) hours as needed for moderate pain. 01/27/14  Yes Fredia Sorrow, MD  JANUMET 50-1000 MG per tablet Take 1 tablet by mouth 2 (two) times daily. 01/12/14  Yes Historical Provider, MD  Multiple Vitamins-Minerals (MULTIVITAMINS THER. W/MINERALS) TABS tablet Take 1 tablet by mouth daily.   Yes Historical Provider, MD  pantoprazole (PROTONIX) 40 MG tablet Take 1 tablet by mouth daily. 01/23/14  Yes Historical Provider, MD  pravastatin (PRAVACHOL) 40 MG tablet Take 1 tablet by mouth daily. 01/01/14  Yes Historical Provider, MD   No Known Allergies  FAMILY HISTORY:  No family history on file. SOCIAL HISTORY:  reports that he has quit smoking. He does not have any smokeless tobacco history on file. He reports that he does not drink alcohol or use illicit drugs.  REVIEW OF SYSTEMS:   Constitutional: Negative for fever, chills initially on presentation, now resolved, weight loss, malaise, now  better /fatigue and diaphoresis.  HENT: Negative for hearing loss, ear pain, nosebleeds, congestion, sore throat + , neck pain, tinnitus and ear discharge.  + sinus headaches  Eyes: Negative for blurred vision, double vision, photophobia, pain, discharge and redness.  Respiratory:cough, now non-productive, was productive of grey sputum hemoptysis, no shortness of breath, wheezing and stridor.  Pleuritic type left chest pain resolved.  Cardiovascular: Negative for chest pain, palpitations, orthopnea, claudication, leg swelling and PND.  Gastrointestinal: Negative for heartburn, nausea, vomiting, abdominal pain, diarrhea, constipation, blood in stool and melena.  Genitourinary: Negative for dysuria, urgency, frequency, hematuria and flank pain.  Musculoskeletal: Negative for myalgias, back pain, joint pain and falls.  Skin: Negative for itching and rash.  Neurological: Negative for dizziness, tingling, tremors, sensory change, speech change, focal weakness, seizures, loss of consciousness, weakness and headaches.  Endo/Heme/Allergies: Negative for environmental allergies and polydipsia. Does not bruise/bleed easily.  SUBJECTIVE:  Feeling better from CP standpoint. Still has nagging non-productive cough  VITAL SIGNS: Temp:  [97.5 F (36.4 C)-99.7 F (37.6 C)] 99.7 F (37.6 C) (07/15 0523) Pulse Rate:  [77-81] 80 (07/15 0523) Resp:  [20] 20 (07/15 0523) BP: (127-145)/(60-68) 127/60 mmHg (07/15 0523) SpO2:  [94 %-96 %] 94 % (07/15 0523) Weight:  [237 lb 9.6 oz (107.775 kg)] 237 lb 9.6 oz (107.775 kg) (07/15 0523) Room air  PHYSICAL EXAMINATION: General:  Obese male, no acute distress.  Neuro:  Awake, oriented, no focal def  HEENT:  [place], no JVD  Cardiovascular:  rrr  Lungs:  Decreased, no accessory muscle use  Abdomen:  Soft, non-tender  Musculoskeletal:  Intact  Skin:  Intact    Recent Labs Lab 03/14/14 0540 03/15/14 0510 03/16/14 0557  NA 135* 136* 138  K 4.8 4.4 4.3  CL 95*  95* 97  CO2 '25 27 28  ' BUN '19 19 14  ' CREATININE 0.92 0.87 0.80  GLUCOSE 139* 141* 141*    Recent Labs Lab 03/14/14 0540 03/15/14 0510 03/16/14 0557  HGB 11.5* 11.2* 11.1*  HCT 35.5* 34.5* 31.8*  WBC 13.8* 11.7* 9.7  PLT 314 359 362   Ct Chest W Contrast  03/14/2014   CLINICAL DATA:  Chest pain  EXAM: CT CHEST WITH CONTRAST  TECHNIQUE: Multidetector CT imaging of the chest was performed during intravenous contrast administration.  CONTRAST:  129m OMNIPAQUE IOHEXOL 300 MG/ML  SOLN  COMPARISON:  CTA chest dated 01/27/2014. Chest radiographs dated 03/13/2014.  FINDINGS: Evaluation of the lung parenchyma is constrained by respiratory motion. No suspicious pulmonary nodules.  Small left and trace right pleural effusions. Associated compressive atelectasis in the bilateral lower lobes. No frank interstitial edema.  Mild eventration of the right hemidiaphragm with associated mild compressive atelectasis in the right middle lobe, likely accounting for the radiographic abnormality.  No pneumothorax.  Visualized thyroid is unremarkable.  Cardiomegaly. Small pericardial effusion. Coronary atherosclerosis.  Small mediastinal lymph nodes, including a 10 mm short axis right paratracheal node (series 2/ image 13), likely reactive.  Visualized upper abdomen there is notable for cholelithiasis.  Degenerative changes of the visualized thoracolumbar spine.  IMPRESSION:  Small left and trace right pleural effusions. No frank interstitial edema.  Eventration of the right hemidiaphragm with mild compressive atelectasis in the right middle lobe, likely accounting for the radiographic abnormality.  Mild bibasilar atelectasis.  Cardiomegaly with small pericardial effusion.   Electronically Signed   By: Julian Hy M.D.   On: 03/14/2014 16:49   Nm Myocar Multi W/spect W/wall Motion / Ef  03/15/2014   CLINICAL DATA:  Chest pain, history of diabetes, hyper of tension, hypercholesteremia, GERD  EXAM: MYOCARDIAL IMAGING  WITH SPECT (REST AND PHARMACOLOGIC-STRESS)  GATED LEFT VENTRICULAR WALL MOTION STUDY  LEFT VENTRICULAR EJECTION FRACTION  TECHNIQUE: Standard myocardial SPECT imaging was performed after resting intravenous injection of 10 mCi Tc-27msestamibi. Subsequently, intravenous infusion of Lexiscan was performed under the supervision of the Cardiology staff. At peak effect of the drug, 30 mCi Tc-910mestamibi was injected intravenously and standard myocardial SPECT imaging was performed. Quantitative gated imaging was also performed to evaluate left ventricular wall motion, and estimate left ventricular ejection fraction.  COMPARISON:  Chest CT- 03/14/2014; 01/27/2014; chest radiograph - 03/13/2014  FINDINGS: Review of the rotational raw images demonstrates moderate GI attenuation on both the provided rest and stress images. There is no significant patient motion artifact.  SPECT imaging demonstrates homogeneous distribution of injected radiotracer without scintigraphic evidence of prior infarction or pharmacologically induced ischemia.  Quantitative gated analysis demonstrates normal wall motion.  The resting left ventricular ejection fraction is 7883%ith end-diastolic volume of 93 ml and end-systolic volume of 21 ml.  IMPRESSION: 1. No scintigraphic evidence of prior infarction or pharmacologically induced ischemia. 2. Normal wall motion.  Ejection fraction - 78%   Electronically Signed   By: JoSandi Mariscal.D.   On: 03/15/2014 13:23    ASSESSMENT / PLAN:  Acute bronchitis suspect viral Left pleuritic type chest Pain. W/ very small left > right effusions. Almost certainly the etiology and c/w viral process. These were evaluated by bedside [place] minimal fluid collection. Not enough to warrant thoracentesis.  Cough. Somewhat acute on chronic in nature. Likely mix of reflux and possibly his Ace-I, exacerbated by an acute viral illness.  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 This does not seem to be impacting his overall health quality. Would be of interest if he had significant exertional dyspnea.  Probable sleep apnea  Former Smoker  Plan: - D/C'd  lisinopril with cough (agree and would not resume) - consider short course of NSAIDs  - complete course of abx (7 days should be adequate) - increase reflux regimen - we will not offer bronchoscopy at this time. Likely cough is post-viral. IF cough does not subside in a few weeks then endo-bronchial exam via bronchoscopy could be considered. - as a side note he likely has sleep apnea.Have set him up for evaluation in our clinic on Aug 31 at 245 pm to begin exploring this . We can also re-evaluate his cough at that time if it is still present.     03/16/2014, 10:08 AM  Plans made as above, pleural effusion is not an issue at this point, pleuritic chest pain has essential resolved, plan as above regarding cough, ACE and reflux regiment.  No role for bronchoscopy this early in the cough work up.  F/U arranged as above.  PCCM will sign off, please call back if needed.  Patient seen and examined, agree with above note.  I dictated the care and orders written for this patient under  my direction.  Rush Farmer, MD [phone]

## 2014-03-16 NOTE — Progress Notes (Signed)
Patient ID: Douglas Mason, male   DOB: [birth year], 63 y.o.   MRN: [id number] Attending physician note: I personally interviewed and examined this patient today together with resident physician Dr. Lottie Mussel and I concur with his evaluation and management plan. At this point we have excluded cardiac disease as the etiology of his symptoms. We are pursuing possibility of an atypical infection versus the effects of chronic exposure to aluminum dust on his job. He has a persistent hacking cough, no fever, elevated ESR, normochromic anemia, unexplained cardiomegaly, small bilateral pleural effusions, and a small, stable, pericardial effusion. Normal lung exam. We have requested pulmonary consultation. Pulmonary function tests have been ordered. Serum immunoglobulins are normal and no end spike is detected on protein electrophoresis. ANA negative. Mycoplasma and QuantiFERON tests pending.

## 2014-03-16 NOTE — Progress Notes (Signed)
Subjective: Douglas Mason was comfortably sitting upright during his exam. He reported an improvement and resolution of Douglas nausea and malaise he was experiencing yesterday. His appetite has improved. He has experienced continued shortness of breath and cough. He has denied any chest pain, pleuritic pain, fever, chills, nausea, abdominal pain, or emesis.  Objective: Vital signs in last 24 hours: Filed Vitals:   03/15/14 0910 03/15/14 1408 03/15/14 2022 03/16/14 0523  BP: 142/60 134/68 145/64 127/60  Pulse: 99 77 81 80  Temp:  98.2 F (36.8 C) 97.5 F (36.4 C) 99.7 F (37.6 C)  TempSrc:  Oral Oral Oral  Resp:  _0 Height:      Weight:    107.775 kg (237 lb 9.6 oz)  SpO2:  95% 96% 94%   Weight change: 1.666 kg (3 lb 10.8 oz) No intake or output data in Douglas 24 hours ending 03/16/14 1147  General: resting in bed HEENT: PERRL, EOMI, no scleral icterus Cardiac: RRR Pulm: clear to auscultation bilaterally, moving normal volumes of air Ext: warm and well perfused, no pedal edema Neuro: alert and oriented X3  Lab Results: CBC Latest Ref Rng 03/16/2014 03/15/2014 03/14/2014  WBC 4.0 - 10.5 K/uL 9.7 11.7(H) 13.8(H)  Hemoglobin 13.0 - 17.0 g/dL 11.1(L) 11.2(L) 11.5(L)  Hematocrit 39.0 - 52.0 % 31.8(L) 34.5(L) 35.5(L)  Platelets 150 - 400 K/uL 362 359 314   BMET    Component Value Date/Time   NA 138 03/16/2014 0557   K 4.3 03/16/2014 0557   CL 97 03/16/2014 0557   CO2 28 03/16/2014 0557   GLUCOSE 141* 03/16/2014 0557   BUN 14 03/16/2014 0557   CREATININE 0.80 03/16/2014 0557   CALCIUM 9.0 03/16/2014 0557   GFRNONAA >90 03/16/2014 0557   GFRAA >90 03/16/2014 0557    Micro Results: Recent Results (from Douglas past 240 hour(s))  URINE CULTURE     Status: None   Collection Time    03/13/14 11:15 AM      Result Value Ref Range Status   Specimen Description URINE, RANDOM   Final   Special Requests NONE   Final   Culture  Setup Time     Final   Value: 03/13/2014 23:25     Performed  at [facility]     Final   Value: NO GROWTH     Performed at [facility]   Culture     Final   Value: NO GROWTH     Performed at [facility]   Report Status 03/14/2014 FINAL   Final   Studies/Results: Ct Chest W Contrast  03/14/2014   CLINICAL DATA:  Chest pain  EXAM: CT CHEST WITH CONTRAST  TECHNIQUE: Multidetector CT imaging of Douglas chest was performed during intravenous contrast administration.  CONTRAST:  129m OMNIPAQUE IOHEXOL 300 MG/ML  SOLN  COMPARISON:  CTA chest dated 01/27/2014. Chest radiographs dated 03/13/2014.  FINDINGS: Evaluation of Douglas lung parenchyma is constrained by respiratory motion. No suspicious pulmonary nodules.  Small left and trace right pleural effusions. Associated compressive atelectasis in Douglas bilateral lower lobes. No frank interstitial edema.  Mild eventration of Douglas right hemidiaphragm with associated mild compressive atelectasis in Douglas right middle lobe, likely accounting for Douglas radiographic abnormality.  No pneumothorax.  Visualized thyroid is unremarkable.  Cardiomegaly. Small pericardial effusion. Coronary atherosclerosis.  Small mediastinal lymph nodes, including a 10 mm short axis right paratracheal node (series 2/ image 13), likely reactive.  Visualized  upper abdomen there is notable for cholelithiasis.  Degenerative changes of Douglas visualized thoracolumbar spine.  IMPRESSION: Small left and trace right pleural effusions. No frank interstitial edema.  Eventration of Douglas right hemidiaphragm with mild compressive atelectasis in Douglas right middle lobe, likely accounting for Douglas radiographic abnormality.  Mild bibasilar atelectasis.  Cardiomegaly with small pericardial effusion.   Electronically Signed   By: Julian Hy M.D.   On: 03/14/2014 16:49   Nm Myocar Multi W/spect W/wall Motion / Ef  03/15/2014   CLINICAL DATA:  Chest pain, history of diabetes, hyper of tension, hypercholesteremia, GERD  EXAM: MYOCARDIAL  IMAGING WITH SPECT (REST AND PHARMACOLOGIC-STRESS)  GATED LEFT VENTRICULAR WALL MOTION STUDY  LEFT VENTRICULAR EJECTION FRACTION  TECHNIQUE: Standard myocardial SPECT imaging was performed after resting intravenous injection of 10 mCi Tc-47msestamibi. Subsequently, intravenous infusion of Lexiscan was performed under Douglas supervision of Douglas Cardiology staff. At peak effect of Douglas drug, 30 mCi Tc-971mestamibi was injected intravenously and standard myocardial SPECT imaging was performed. Quantitative gated imaging was also performed to evaluate left ventricular wall motion, and estimate left ventricular ejection fraction.  COMPARISON:  Chest CT- 03/14/2014; 01/27/2014; chest radiograph - 03/13/2014  FINDINGS: Review of Douglas rotational raw images demonstrates moderate GI attenuation on both Douglas provided rest and stress images. There is no significant Mason motion artifact.  SPECT imaging demonstrates homogeneous distribution of injected radiotracer without scintigraphic evidence of prior infarction or pharmacologically induced ischemia.  Quantitative gated analysis demonstrates normal wall motion.  Douglas resting left ventricular ejection fraction is 7853%ith end-diastolic volume of 93 ml and end-systolic volume of 21 ml.  IMPRESSION: 1. No scintigraphic evidence of prior infarction or pharmacologically induced ischemia. 2. Normal wall motion.  Ejection fraction - 78%   Electronically Signed   By: JoSandi Mariscal.D.   On: 03/15/2014 13:23   Medications: I have reviewed Douglas Mason's current medications. Scheduled Meds: . aspirin  325 mg Oral Daily  . azithromycin  500 mg Oral Q24H  . heparin  5,000 Units Subcutaneous 3 times per day  . insulin aspart  0-15 Units Subcutaneous TID WC  . metoprolol tartrate  25 mg Oral BID  . pantoprazole  40 mg Oral Daily  . simvastatin  80 mg Oral q1800   Continuous Infusions:  PRN Meds:.acetaminophen, gi cocktail, guaiFENesin-codeine, HYDROcodone-acetaminophen, morphine  injection, ondansetron (ZOFRAN) IV Assessment/Plan: Principal Problem:   Chest pain with moderate risk of acute coronary syndrome Active Problems:   HTN (hypertension)   Diabetes   GERD (gastroesophageal reflux disease)   CAP (community acquired pneumonia)   Atrial flutter   Pericardial effusion by CT, not seen by echo  1. Chest pain  Douglas origin of Douglas Mason's chest pain is not yet determined. Per cardiology, likely not anginal pain.  MI is unlikely given normal EKG and normal troponin x3.  Aortic dissection is unlikely given chronic course of pain and does not endorse tearing pain, no widened mediastinum on CXR.  PE is unlikely given lack of DVT symptoms (swollen calf, pitting edema, pain, warmth, localized tenderness), risk factors (hypercoagulable state, immobilization, smoking).  Pericarditis (viral/bacterial)/myopericarditis unlikely given lack of EKG findings.  Pneumonia: Mycoplasma pneumonia possible considering Douglas chronic nature of Douglas pain, Douglas productive cough, Douglas high white count, and Douglas normal CXR  ESR of 66 (7/12). Echocardiogram performed 7/13 and showed no abnormalities. CTA (01/27/14) showed no acute PE, trace pericardial effusion, and mild coronary calcification, unlikely to be coronary in etiology. Peripheral blood smear with  benign monocytosis which is nonspecific. Malignancy should be considered. Amyloidosis should be considered. Occupational exposure to aluminum should also be considered. Pulmonary has been consulted. Stress myoview (7/14) was normal. ANA (7/14): negative -continue telemetry  -Lisinopril 55m PO daily  -Metoprolol tartrate 223mPO twice daily  -Simvastatin 8079mO daily  -Aspirin 325m28m daily -Norco/Vicodin PRN pain  -Morphine PRN pain  -Zofran PRN nausea  -DVT prophylaxis   2. Cough  Continued cough that is productive of thick grey phlegm. Due to chest x ray suggestive of right lower lobe infiltrate, high WBC, and pleuritic chest pain,  presumed community acquired pneumonia. Begun on Ceftriaxone/Azithromycin for CAP and given guaiFENesin-codeine for cough suppression. WBC has come down to 11.7 from 13.8 (7/13) and from 15.0 (7/12). CT did not reveal a pneumonia, but continue with Ceftriaxone/Azithromycin to cover for mycoplasma. Blood smear monocytosis may suggest a granulomatous pathology, but Douglas is nonspecific. Strep pneumo and legionella urine antigens were negative.  -continue Azithromycin every 24 hours; consider z pack or po azithromycin or doxycycline on discharge   -guaiFENesin-codeine PRN cough  -repeat sputum culture  -quantiferon gold assay ordered, results pending  -PFTs ordered, results pending  -IgM mycoplasma ordered, results pending  -pulmonary consult requested today -ambulate today to check pulse oximetry  3. Hypertension  110s-140s/60s-70s, well controlled.  -Aspirin 325 mg -metoprolol tartrate 25mg70mimvastatin 80mg 35mart healthy/carb modified diet   4. Diabetes mellitus type II  Hemoglobin A1c 7/12: 5.5%. Sliding scale insulin has been ordered for duration of hospital stay.  -continue novoLOG  -CBG monitoring   5. Gastroesophageal reflux disease  Stable for many years, per Mason.  -Protonix 40mg P4mily   6. Normocytic anemia with monocytosis  There are many potential etiologies of monocytosis. Douglas Mason could have an infection (tuberculosis), autoimmune condition, or a myelodysplastic disorder. ANA was negative (7/14). -multiple myeloma panel, preliminary results: IgG 1130, IgA 158, IgM 90 (all within normal limits) -quantiferon gold ordered, results pending    7. Asymptomatic UTI  UA is nitrite positive with trace LE and few granular casts. He is asymptomatic and Douglas urine culture is negative.  -monitor for symptoms   Dispo: Disposition is deferred at Douglas time, awaiting improvement of current medical problems.  Anticipated discharge in approximately 0-1 day(s).   Douglas Mason  does have a current PCP (Zach HDelphina Cahillnd [facility].  Douglas Mason does not have transportation limitations that hinder transportation to clinic appointments.  .Services Needed at time of discharge: Y = Yes, Blank = No PT:   OT:   RN:   Equipment:   Other:     LOS: 3 days   KathrynAlvina Choutudent 03/16/2014, 11:47 AM

## 2014-03-16 NOTE — Progress Notes (Signed)
SATURATION QUALIFICATIONS: (This note is used to comply with regulatory documentation for home oxygen)  Patient Saturations on Room Air at Rest = 96%  Patient Saturations on Room Air while Ambulating = 94%  Patient Saturations on n/a Liters of oxygen while Ambulating = n/a  Please briefly explain why patient needs home oxygen: Doesn't qualify

## 2014-03-16 NOTE — Progress Notes (Signed)
Subjective: Mr. Say was sitting upright during his interview and exam. He said he felt much better since his malaise and nausea from yesterday resolved. He tolerated breakfast well. He reported his baseline SOB and that his cough is unchanged. He denied any chest pain, fever, chills, night sweats, palpitations, abdominal pain, nausea or emesis.  Objective: Vital signs in last 24 hours: Filed Vitals:   03/15/14 0910 03/15/14 1408 03/15/14 2022 03/16/14 0523  BP: 142/60 134/68 145/64 127/60  Pulse: 99 77 81 80  Temp:  98.2 F (36.8 C) 97.5 F (36.4 C) 99.7 F (37.6 C)  TempSrc:  Oral Oral Oral  Resp:  _0 Height:      Weight:    237 lb 9.6 oz (107.775 kg)  SpO2:  95% 96% 94%   Weight change: 3 lb 10.8 oz (1.666 kg) No intake or output data in the 24 hours ending 03/16/14 1126 BP 127/60  Pulse 80  Temp(Src) 99.7 F (37.6 C) (Oral)  Resp 20  Ht 6' (1.829 m)  Wt 237 lb 9.6 oz (107.775 kg)  BMI 32.22 kg/m2  SpO2 94%  General Appearance:    Alert, cooperative, no distress, appears stated age  HEENT:    Normocephalic, without obvious abnormality, atraumatic, PERRL, moist mucous membranes  Back:     Symmetric, no curvature  Lungs:     Clear to auscultation bilaterally, respirations unlabored  Chest wall:    No tenderness or deformity  Heart:    Regular rate and rhythm, S1 and S2 normal, no murmur, rub   or gallop  Abdomen:     Soft, non-tender, bowel sounds active all four quadrants,    no masses, no organomegaly  Extremities:   Extremities normal, atraumatic, no cyanosis or edema  Pulses:   2+ and symmetric radial, DP, PT pulses   Lab Results: Basic Metabolic Panel:  Recent Labs Lab 03/15/14 0510 03/16/14 0557  NA 136* 138  K 4.4 4.3  CL 95* 97  CO2 27 28  GLUCOSE 141* 141*  BUN 19 14  CREATININE 0.87 0.80  CALCIUM 9.0 9.0   Liver Function Tests:  Recent Labs Lab 03/13/14 0828 03/14/14 1400  AST 30  --   ALT 47  --   ALKPHOS 73  --   BILITOT  1.3*  --   PROT 7.7 PENDING  ALBUMIN 3.7  --     Recent Labs Lab 03/13/14 0911  LIPASE 29   No results found for this basename: AMMONIA,  in the last 168 hours CBC:  Recent Labs Lab 03/13/14 0828  03/15/14 0510 03/16/14 0557  WBC 15.0*  < > 11.7* 9.7  NEUTROABS 10.7*  --   --  6.3  HGB 11.8*  < > 11.2* 11.1*  HCT 35.5*  < > 34.5* 31.8*  MCV 91.7  < > 92.2 88.3  PLT 298  < > 359 362  < > = values in this interval not displayed. Cardiac Enzymes:  Recent Labs Lab 03/13/14 1425 03/13/14 1828 03/13/14 2042  TROPONINI <0.30 <0.30 <0.30   BNP:  Recent Labs Lab 03/13/14 0911  PROBNP 190.3*   D-Dimer: No results found for this basename: DDIMER,  in the last 168 hours CBG:  Recent Labs Lab 03/14/14 2124 03/15/14 0715 03/15/14 1021 03/15/14 1633 03/15/14 2103 03/16/14 0717  GLUCAP 123* 142* 143* 151* 103* 201*   Hemoglobin A1C:  Recent Labs Lab 03/13/14 1425  HGBA1C 5.5   Fasting Lipid Panel:  Recent Labs  Lab 03/13/14 1425  CHOL 122  HDL 34*  LDLCALC 74  TRIG 68  CHOLHDL 3.6   Thyroid Function Tests:  Recent Labs Lab 03/13/14 1425  TSH 1.330   Coagulation: No results found for this basename: LABPROT, INR,  in the last 168 hours Anemia Panel: No results found for this basename: VITAMINB12, FOLATE, FERRITIN, TIBC, IRON, RETICCTPCT,  in the last 168 hours Urine Drug Screen: Drugs of Abuse     Component Value Date/Time   LABOPIA POSITIVE* 03/13/2014 1115   COCAINSCRNUR NONE DETECTED 03/13/2014 1115   LABBENZ NONE DETECTED 03/13/2014 1115   AMPHETMU NONE DETECTED 03/13/2014 1115   THCU NONE DETECTED 03/13/2014 1115   LABBARB NONE DETECTED 03/13/2014 1115    Alcohol Level: No results found for this basename: ETH,  in the last 168 hours Urinalysis:  Recent Labs Lab 03/13/14 1115  COLORURINE YELLOW  LABSPEC 1.031*  PHURINE 5.0  GLUCOSEU NEGATIVE  HGBUR NEGATIVE  BILIRUBINUR MODERATE*  KETONESUR 15*  PROTEINUR 100*  UROBILINOGEN 4.0*   NITRITE POSITIVE*  LEUKOCYTESUR TRACE*   Quantiferon pending ANA pending PFT pending Mycoplasmia IgM pending  Multiple myeloma panel preliminary: IgG 1130, IgA 158, IgM 90 (all normal)  Micro Results: Recent Results (from the past 240 hour(s))  URINE CULTURE     Status: None   Collection Time    03/13/14 11:15 AM      Result Value Ref Range Status   Specimen Description URINE, RANDOM   Final   Special Requests NONE   Final   Culture  Setup Time     Final   Value: 03/13/2014 23:25     Performed at [place]     Final   Value: NO GROWTH     Performed at [facility]   Culture     Final   Value: NO GROWTH     Performed at [facility]   Report Status 03/14/2014 FINAL   Final   Peripheral blood smear: benign monocytosis   Studies/Results: Ct Chest W Contrast  03/14/2014   CLINICAL DATA:  Chest pain  EXAM: CT CHEST WITH CONTRAST  TECHNIQUE: Multidetector CT imaging of the chest was performed during intravenous contrast administration.  CONTRAST:  170m OMNIPAQUE IOHEXOL 300 MG/ML  SOLN  COMPARISON:  CTA chest dated 01/27/2014. Chest radiographs dated 03/13/2014.  FINDINGS: Evaluation of the lung parenchyma is constrained by respiratory motion. No suspicious pulmonary nodules.  Small left and trace right pleural effusions. Associated compressive atelectasis in the bilateral lower lobes. No frank interstitial edema.  Mild eventration of the right hemidiaphragm with associated mild compressive atelectasis in the right middle lobe, likely accounting for the radiographic abnormality.  No pneumothorax.  Visualized thyroid is unremarkable.  Cardiomegaly. Small pericardial effusion. Coronary atherosclerosis.  Small mediastinal lymph nodes, including a 10 mm short axis right paratracheal node (series 2/ image 13), likely reactive.  Visualized upper abdomen there is notable for cholelithiasis.  Degenerative changes of the visualized thoracolumbar spine.   IMPRESSION: Small left and trace right pleural effusions. No frank interstitial edema.  Eventration of the right hemidiaphragm with mild compressive atelectasis in the right middle lobe, likely accounting for the radiographic abnormality.  Mild bibasilar atelectasis.  Cardiomegaly with small pericardial effusion.   Electronically Signed   By: SJulian HyM.D.   On: 03/14/2014 16:49   Nm Myocar Multi W/spect W/wall Motion / Ef  03/15/2014   CLINICAL DATA:  Chest pain, history of diabetes, hyper  of tension, hypercholesteremia, GERD  EXAM: MYOCARDIAL IMAGING WITH SPECT (REST AND PHARMACOLOGIC-STRESS)  GATED LEFT VENTRICULAR WALL MOTION STUDY  LEFT VENTRICULAR EJECTION FRACTION  TECHNIQUE: Standard myocardial SPECT imaging was performed after resting intravenous injection of 10 mCi Tc-33msestamibi. Subsequently, intravenous infusion of Lexiscan was performed under the supervision of the Cardiology staff. At peak effect of the drug, 30 mCi Tc-952mestamibi was injected intravenously and standard myocardial SPECT imaging was performed. Quantitative gated imaging was also performed to evaluate left ventricular wall motion, and estimate left ventricular ejection fraction.  COMPARISON:  Chest CT- 03/14/2014; 01/27/2014; chest radiograph - 03/13/2014  FINDINGS: Review of the rotational raw images demonstrates moderate GI attenuation on both the provided rest and stress images. There is no significant patient motion artifact.  SPECT imaging demonstrates homogeneous distribution of injected radiotracer without scintigraphic evidence of prior infarction or pharmacologically induced ischemia.  Quantitative gated analysis demonstrates normal wall motion.  The resting left ventricular ejection fraction is 7854%ith end-diastolic volume of 93 ml and end-systolic volume of 21 ml.  IMPRESSION: 1. No scintigraphic evidence of prior infarction or pharmacologically induced ischemia. 2. Normal wall motion.  Ejection fraction -  78%   Electronically Signed   By: JoSandi Mariscal.D.   On: 03/15/2014 13:23   7/13 2D echo with contrast Left ventricle: The cavity size was normal. Systolic function was normal. The estimated ejection fraction was in the range of 55% to 60%. Wall motion was normal; there were no regional wall motion abnormalities. Left ventricular diastolic function parameters were normal.   Medications: I have reviewed the patient's current medications. Scheduled Meds: . aspirin  325 mg Oral Daily  . azithromycin  500 mg Oral Q24H  . heparin  5,000 Units Subcutaneous 3 times per day  . insulin aspart  0-15 Units Subcutaneous TID WC  . metoprolol tartrate  25 mg Oral BID  . pantoprazole  40 mg Oral Daily  . simvastatin  80 mg Oral q1800   Continuous Infusions:  PRN Meds:.acetaminophen, gi cocktail, guaiFENesin-codeine, HYDROcodone-acetaminophen, morphine injection, ondansetron (ZOFRAN) IV Assessment/Plan: Principal Problem:   Chest pain with moderate risk of acute coronary syndrome Active Problems:   HTN (hypertension)   Diabetes   GERD (gastroesophageal reflux disease)   CAP (community acquired pneumonia)   Atrial flutter   Pericardial effusion by CT, not seen by echo  1. Atypical Chest Pain: The etiology of Mr. Rigsbee's chest pain is unclear. Troponin negative X 3, EKG wnl, echo normal, myoview stress normal so cardiac seems unlikely. UDS only positive for opiates he reported taking for this pain. PE is unlikely as he is not tachycardic with no calf tenderness on exam and no known risk factors. He denies any trauma or chest wall tenderness to palpation in terms of costochondritis. He says this is different than his GERD pain. EKG did not have findings associated with pericarditis and the echo did not demonstrate pericarditis. He is being treated for a possible CAP as his CXR showed RLL infiltrate v atelectasis. Mycoplasma pneumonia is possible as well. CT did not show any consolidation or mass  although there were mildly reactive mediastinal lymph nodes. Occupational exposure from [employer] is a consideration (pulmonary fibrosis not seen on CT, consider pulmonary alveolar proteinosis). Peripheral blood smear with benign monocytosis which is nonspecific. Malignancy should also be considered. Prelim mutiple myeloma panel have IgG, IgA, and IgM normal. Pulmonary consult would be helpful given occupational exposure and unclear picture. -appreciate cards -pulmonary consult -telemetry  monitoring  -f/u multiple myeloma panel -f/u, PFT, ANA, mycoplasma pneumoniae IgM antibody, quantiferon -ASA 81, metoprolol 25 mg po BID, simvastatin 80 mg po daily  -norco/vicodin 1-2 tabs q6hprn   2. Productive Cough: See above. Mr. Umar notes that his cough has persisted and still is productive of thick grey phlegm. His WBC is slightly down at 9.7 with monocytosis 15%. His cough may also be related to reflux. Chest CT demonstrated small trace L and R pleural effusions but no consolidation or mass. Mycoplasma remains on differential. Blood smear monocytosis may suggest granulomatous pathology but is nonspecific. Strep pneumo and legionella urine antigens negative. -pulm consult -cont azithromycin 500 mg iv daily, consider z pack or po azithromycin or doxycycline on discharge -d/c ceftriaxone 1 g iv daily -f/u sputum culture  -cont to monitor   3. Asymptomatic UTI: UA is nitrite positive with trace LE and few granular casts. Patient is still asymptomatic and UCx negative -cont to monitor for symptoms  4. HTN: Mr. Tanguma BP is 120-140s/60-70s this morning and is well controlled.  -metoprolol 25 mg po BID, lisinopril 5 mg po daily, simvastatin 80 mg po daily  5. DM2: HgbA1c is 5.5 and glucose has been 100s-200s  -SSI  -cont to monitor  6. GERD: Patient does not believe his GERD is cause of this pain as he has had GERD for years and this feels different than his usual reflux.  -protonix  40 mg po daily  -GI cocktail QIDPRN  Dispo: Disposition is deferred at this time, awaiting improvement of current medical problems.  Anticipated discharge in approximately 2 day(s).   The patient does have a current PCP Delphina Cahill, MD) and does need an [facility] hospital follow-up appointment after discharge.  The patient does not have transportation limitations that hinder transportation to clinic appointments.  .Services Needed at time of discharge: Y = Yes, Blank = No PT:   OT:   RN:   Equipment:   Other:     LOS: 3 days   Kelby Aline, MD 03/16/2014, 11:26 AM

## 2014-03-16 NOTE — Progress Notes (Signed)
Patient Name: Douglas Mason Date of Encounter: 03/16/2014     Principal Problem:   Chest pain with moderate risk of acute coronary syndrome Active Problems:   HTN (hypertension)   Diabetes   GERD (gastroesophageal reflux disease)   CAP (community acquired pneumonia)   Atrial flutter   Pericardial effusion by CT, not seen by echo    SUBJECTIVE  Denies any more chest pain or SOB. Continue to have cough overnight, did not sleep well. States when he had CP yesterday, it was sharp and "it hurt every time I take in a deep breath"  CURRENT MEDS . aspirin  325 mg Oral Daily  . azithromycin  500 mg Oral Q24H  . heparin  5,000 Units Subcutaneous 3 times per day  . insulin aspart  0-15 Units Subcutaneous TID WC  . metoprolol tartrate  25 mg Oral BID  . pantoprazole  40 mg Oral Daily  . simvastatin  80 mg Oral q1800    OBJECTIVE  Filed Vitals:   03/15/14 0910 03/15/14 1408 03/15/14 2022 03/16/14 0523  BP: 142/60 134/68 145/64 127/60  Pulse: 99 77 81 80  Temp:  98.2 F (36.8 C) 97.5 F (36.4 C) 99.7 F (37.6 C)  TempSrc:  Oral Oral Oral  Resp:  20 20 20   Height:      Weight:    237 lb 9.6 oz (107.775 kg)  SpO2:  95% 96% 94%   No intake or output data in the 24 hours ending 03/16/14 1116 Filed Weights   03/13/14 1249 03/15/14 0415 03/16/14 0523  Weight: 234 lb (106.142 kg) 233 lb 14.9 oz (106.109 kg) 237 lb 9.6 oz (107.775 kg)    PHYSICAL EXAM  General: Pleasant, NAD. Neuro: Alert and oriented X 3. Moves all extremities spontaneously. Psych: Normal affect. HEENT:  Normal  Neck: Supple without bruits or JVD. Lungs:  Resp regular and unlabored, bibasilar rale, largely clear to ausctation Heart: RRR no s3, s4, or murmurs. Abdomen: Soft, non-tender, BS + x 4. Abdomen distended, however nontender Extremities: No clubbing, cyanosis or edema. DP/PT/Radials 2+ and equal bilaterally.  Accessory Clinical Findings  CBC  Recent Labs  03/15/14 0510 03/16/14 0557    WBC 11.7* 9.7  NEUTROABS  --  6.3  HGB 11.2* 11.1*  HCT 34.5* 31.8*  MCV 92.2 88.3  PLT 359 517   Basic Metabolic Panel  Recent Labs  03/15/14 0510 03/16/14 0557  NA 136* 138  K 4.4 4.3  CL 95* 97  CO2 27 28  GLUCOSE 141* 141*  BUN 19 14  CREATININE 0.87 0.80  CALCIUM 9.0 9.0   Liver Function Tests  Recent Labs  03/14/14 1400  PROT PENDING   Cardiac Enzymes  Recent Labs  03/13/14 1425 03/13/14 1828 03/13/14 2042  TROPONINI <0.30 <0.30 <0.30   Hemoglobin A1C  Recent Labs  03/13/14 1425  HGBA1C 5.5   Fasting Lipid Panel  Recent Labs  03/13/14 1425  CHOL 122  HDL 34*  LDLCALC 74  TRIG 68  CHOLHDL 3.6   Thyroid Function Tests  Recent Labs  03/13/14 1425  TSH 1.330    TELE  NSR with frequent PACs and PVCs  ECG  NSR with HR 80s, no significant ST-T wave changes  Radiology/Studies  Dg Chest 2 View  03/13/2014   CLINICAL DATA:  Productive cough x4 days  EXAM: CHEST  2 VIEW  COMPARISON:  Chest radiograph 03/13/2014  FINDINGS: Stable enlarged cardiac silhouette. There is right lower lobe opacity. Mild  central venous congestion. Trace effusions.  IMPRESSION: Right lower lobe infiltrate versus atelectasis.   Electronically Signed   By: Suzy Bouchard M.D.   On: 03/13/2014 12:46   Ct Chest W Contrast  03/14/2014   CLINICAL DATA:  Chest pain  EXAM: CT CHEST WITH CONTRAST  TECHNIQUE: Multidetector CT imaging of the chest was performed during intravenous contrast administration.  CONTRAST:  128mL OMNIPAQUE IOHEXOL 300 MG/ML  SOLN  COMPARISON:  CTA chest dated 01/27/2014. Chest radiographs dated 03/13/2014.  FINDINGS: Evaluation of the lung parenchyma is constrained by respiratory motion. No suspicious pulmonary nodules.  Small left and trace right pleural effusions. Associated compressive atelectasis in the bilateral lower lobes. No frank interstitial edema.  Mild eventration of the right hemidiaphragm with associated mild compressive atelectasis in  the right middle lobe, likely accounting for the radiographic abnormality.  No pneumothorax.  Visualized thyroid is unremarkable.  Cardiomegaly. Small pericardial effusion. Coronary atherosclerosis.  Small mediastinal lymph nodes, including a 10 mm short axis right paratracheal node (series 2/ image 13), likely reactive.  Visualized upper abdomen there is notable for cholelithiasis.  Degenerative changes of the visualized thoracolumbar spine.  IMPRESSION: Small left and trace right pleural effusions. No frank interstitial edema.  Eventration of the right hemidiaphragm with mild compressive atelectasis in the right middle lobe, likely accounting for the radiographic abnormality.  Mild bibasilar atelectasis.  Cardiomegaly with small pericardial effusion.   Electronically Signed   By: Julian Hy M.D.   On: 03/14/2014 16:49   Nm Myocar Multi W/spect W/wall Motion / Ef  03/15/2014   CLINICAL DATA:  Chest pain, history of diabetes, hyper of tension, hypercholesteremia, GERD  EXAM: MYOCARDIAL IMAGING WITH SPECT (REST AND PHARMACOLOGIC-STRESS)  GATED LEFT VENTRICULAR WALL MOTION STUDY  LEFT VENTRICULAR EJECTION FRACTION  TECHNIQUE: Standard myocardial SPECT imaging was performed after resting intravenous injection of 10 mCi Tc-58m sestamibi. Subsequently, intravenous infusion of Lexiscan was performed under the supervision of the Cardiology staff. At peak effect of the drug, 30 mCi Tc-10m sestamibi was injected intravenously and standard myocardial SPECT imaging was performed. Quantitative gated imaging was also performed to evaluate left ventricular wall motion, and estimate left ventricular ejection fraction.  COMPARISON:  Chest CT- 03/14/2014; 01/27/2014; chest radiograph - 03/13/2014  FINDINGS: Review of the rotational raw images demonstrates moderate GI attenuation on both the provided rest and stress images. There is no significant patient motion artifact.  SPECT imaging demonstrates homogeneous distribution  of injected radiotracer without scintigraphic evidence of prior infarction or pharmacologically induced ischemia.  Quantitative gated analysis demonstrates normal wall motion.  The resting left ventricular ejection fraction is 75% with end-diastolic volume of 93 ml and end-systolic volume of 21 ml.  IMPRESSION: 1. No scintigraphic evidence of prior infarction or pharmacologically induced ischemia. 2. Normal wall motion.  Ejection fraction - 78%   Electronically Signed   By: Sandi Mariscal M.D.   On: 03/15/2014 13:23   Dg Chest Port 1 View  03/13/2014   CLINICAL DATA:  Short of breath, productive cough  EXAM: PORTABLE CHEST - 1 VIEW  COMPARISON:  01/27/2014  FINDINGS: Normal mediastinum and cardiac silhouette. Normal pulmonary vasculature. No evidence of effusion, infiltrate, or pneumothorax. No acute bony abnormality. Low lung volumes  IMPRESSION: Low lung volumes.  No acute findings   Electronically Signed   By: Suzy Bouchard M.D.   On: 03/13/2014 09:38    ASSESSMENT AND PLAN  1. Chest pain with moderate risk of acute coronary syndrome  - Echo 03/14/2014 EF  55-60%, no regional wall motion abnormality  - Myoview 03/15/2014 EF 78%, no evidence of prior infarction or ischemia  - symptom of his chest pain is very much pleuritic, no further cardiac workup necessary  - potential outpatient sleep study to r/o OSA  - cardiology may sign off, will leave it to Dr. Mare Ferrari  2. Possible pneumonia vs bronchitis  - treatment per primary team  3. HTN (hypertension)  4. Diabetes  5. CAP (community acquired pneumonia)  28. Atrial flutter  7. GERD (gastroesophageal reflux disease)  8. Pericardial effusion by CT, not seen by echo  Signed, Woodward Ku Pager: [phone] Today he has a very soft pleuropericardial rub on auscultation.  This would be consistent with his initial presentation of severe sharp chest pain and cough.  I suspect he has had a recent respiratory illness with mild pleuritis and  pericarditis.  No further cardiac workup needed. He can take ibuprofen if needed for pain. Will sign off now.

## 2014-03-17 LAB — CBC
HCT: 33.7 % — ABNORMAL LOW (ref 39.0–52.0)
Hemoglobin: 11.4 g/dL — ABNORMAL LOW (ref 13.0–17.0)
MCH: 30.8 pg (ref 26.0–34.0)
MCHC: 33.8 g/dL (ref 30.0–36.0)
MCV: 91.1 fL (ref 78.0–100.0)
PLATELETS: ADEQUATE 10*3/uL (ref 150–400)
RBC: 3.7 MIL/uL — ABNORMAL LOW (ref 4.22–5.81)
RDW: 15.1 % (ref 11.5–15.5)
WBC: 9.3 10*3/uL (ref 4.0–10.5)

## 2014-03-17 LAB — BASIC METABOLIC PANEL
ANION GAP: 16 — AB (ref 5–15)
BUN: 13 mg/dL (ref 6–23)
CO2: 27 meq/L (ref 19–32)
Calcium: 9 mg/dL (ref 8.4–10.5)
Chloride: 96 mEq/L (ref 96–112)
Creatinine, Ser: 0.8 mg/dL (ref 0.50–1.35)
GFR calc Af Amer: >90 mL/min (ref >90)
Glucose, Bld: 135 mg/dL — ABNORMAL HIGH (ref 70–99)
Potassium: 4.7 mEq/L (ref 3.7–5.3)
SODIUM: 139 meq/L (ref 137–147)

## 2014-03-17 LAB — GLUCOSE, CAPILLARY: Glucose-Capillary: 137 mg/dL — ABNORMAL HIGH (ref 70–99)

## 2014-03-17 MED ORDER — PANTOPRAZOLE SODIUM 40 MG PO TBEC: 40.0000 mg | DELAYED_RELEASE_TABLET | Freq: Two times a day (BID) | ORAL | Status: DC

## 2014-03-17 MED ORDER — AZITHROMYCIN 500 MG PO TABS: 500.0000 mg | ORAL_TABLET | ORAL | Status: DC

## 2014-03-17 MED ORDER — METOPROLOL TARTRATE 25 MG PO TABS: 25.0000 mg | ORAL_TABLET | Freq: Two times a day (BID) | ORAL | Status: DC

## 2014-03-17 MED ORDER — SIMVASTATIN 80 MG PO TABS: 80.0000 mg | ORAL_TABLET | Freq: Every day | ORAL | Status: DC

## 2014-03-17 MED ORDER — GUAIFENESIN-CODEINE 100-10 MG/5ML PO SOLN: 10.0000 mL | ORAL | Status: DC | PRN

## 2014-03-17 NOTE — Progress Notes (Signed)
Subjective: Douglas Mason was sitting upright during his interview and exam. He still reports his cough and thinks that the ACEI cough was dry while this cough is wet. He otherwise feels fine and is very eager to go home. He denied any chest pain, fever, chills, night sweats, palpitations, abdominal pain, nausea or emesis.  Objective: Vital signs in last 24 hours: Filed Vitals:   03/16/14 0523 03/16/14 1436 03/16/14 2130 03/17/14 0512  BP: 127/60 129/62 134/63 124/60  Pulse: 80 82 92 77  Temp: 99.7 F (37.6 C)  98 F (36.7 C) 98.2 F (36.8 C)  TempSrc: Oral  Oral Oral  Resp: '20 20 20 20  ' Height:      Weight: 237 lb 9.6 oz (107.775 kg)   236 lb 12.6 oz (107.406 kg)  SpO2: 94% 94% 98% 94%   Weight change: -13 oz (-0.368 kg)  Intake/Output Summary (Last 24 hours) at 03/17/14 1210 Last data filed at 03/17/14 0831  Gross per 24 hour  Intake    360 ml  Output      0 ml  Net    360 ml   BP 124/60  Pulse 77  Temp(Src) 98.2 F (36.8 C) (Oral)  Resp 20  Ht 6' (1.829 m)  Wt 236 lb 12.6 oz (107.406 kg)  BMI 32.11 kg/m2  SpO2 94%  General Appearance:    Alert, cooperative, no distress, appears stated age  HEENT:    Normocephalic, without obvious abnormality, atraumatic, PERRL, moist mucous membranes  Back:     Symmetric, no curvature  Lungs:     Clear to auscultation bilaterally, respirations unlabored  Chest wall:    No tenderness or deformity  Heart:    Regular rate and rhythm, S1 and S2 normal, no murmur, rub   or gallop  Abdomen:     Soft, non-tender, bowel sounds active all four quadrants,    no masses, no organomegaly  Extremities:   Extremities normal, atraumatic, no cyanosis or edema  Pulses:   2+ and symmetric radial, DP, PT pulses   Lab Results: Basic Metabolic Panel:  Recent Labs Lab 03/16/14 0557 03/17/14 0624  NA 138 139  K 4.3 4.7  CL 97 96  CO2 28 27  GLUCOSE 141* 135*  BUN 14 13  CREATININE 0.80 0.80  CALCIUM 9.0 9.0   Liver Function  Tests:  Recent Labs Lab 03/13/14 0828 03/14/14 1400  AST 30  --   ALT 47  --   ALKPHOS 73  --   BILITOT 1.3*  --   PROT 7.7 6.6  ALBUMIN 3.7  --     Recent Labs Lab 03/13/14 0911  LIPASE 29   No results found for this basename: AMMONIA,  in the last 168 hours CBC:  Recent Labs Lab 03/13/14 0828  03/16/14 0557 03/17/14 0624  WBC 15.0*  < > 9.7 9.3  NEUTROABS 10.7*  --  6.3  --   HGB 11.8*  < > 11.1* 11.4*  HCT 35.5*  < > 31.8* 33.7*  MCV 91.7  < > 88.3 91.1  PLT 298  < > 362 PLATELET CLUMPS NOTED ON SMEAR, COUNT APPEARS ADEQUATE  < > = values in this interval not displayed. Cardiac Enzymes:  Recent Labs Lab 03/13/14 1425 03/13/14 1828 03/13/14 2042  TROPONINI <0.30 <0.30 <0.30   BNP:  Recent Labs Lab 03/13/14 0911  PROBNP 190.3*   D-Dimer: No results found for this basename: DDIMER,  in the last 168 hours CBG:  Recent  Labs Lab 03/15/14 2103 03/16/14 0717 03/16/14 1125 03/16/14 1642 03/16/14 2132 03/17/14 0724  GLUCAP 103* 201* 86 115* 122* 137*   Hemoglobin A1C:  Recent Labs Lab 03/13/14 1425  HGBA1C 5.5   Fasting Lipid Panel:  Recent Labs Lab 03/13/14 1425  CHOL 122  HDL 34*  LDLCALC 74  TRIG 68  CHOLHDL 3.6   Thyroid Function Tests:  Recent Labs Lab 03/13/14 1425  TSH 1.330   Coagulation: No results found for this basename: LABPROT, INR,  in the last 168 hours Anemia Panel: No results found for this basename: VITAMINB12, FOLATE, FERRITIN, TIBC, IRON, RETICCTPCT,  in the last 168 hours Urine Drug Screen: Drugs of Abuse     Component Value Date/Time   LABOPIA POSITIVE* 03/13/2014 1115   COCAINSCRNUR NONE DETECTED 03/13/2014 1115   LABBENZ NONE DETECTED 03/13/2014 1115   AMPHETMU NONE DETECTED 03/13/2014 1115   THCU NONE DETECTED 03/13/2014 1115   LABBARB NONE DETECTED 03/13/2014 1115    Alcohol Level: No results found for this basename: ETH,  in the last 168 hours Urinalysis:  Recent Labs Lab 03/13/14 1115   COLORURINE YELLOW  LABSPEC 1.031*  PHURINE 5.0  GLUCOSEU NEGATIVE  HGBUR NEGATIVE  BILIRUBINUR MODERATE*  KETONESUR 15*  PROTEINUR 100*  UROBILINOGEN 4.0*  NITRITE POSITIVE*  LEUKOCYTESUR TRACE*  ANA negative Pulmonary Functions Testing Results: FEV1 Pre 62, Post 54 FVC Pre 58, Post 49 FEV1/FVC Pre 105, Post 109  Quantiferon pending Mycoplasmia IgM pending  Multiple myeloma panel preliminary: wnl per Dr. Knute Neu Results: Recent Results (from the past 240 hour(s))  URINE CULTURE     Status: None   Collection Time    03/13/14 11:15 AM      Result Value Ref Range Status   Specimen Description URINE, RANDOM   Final   Special Requests NONE   Final   Culture  Setup Time     Final   Value: 03/13/2014 23:25     Performed at [facility] Count     Final   Value: NO GROWTH     Performed at [facility]   Culture     Final   Value: NO GROWTH     Performed at [facility]   Report Status 03/14/2014 FINAL   Final   Peripheral blood smear: benign monocytosis   Studies/Results: Nm Myocar Multi W/spect W/wall Motion / Ef  03/15/2014   CLINICAL DATA:  Chest pain, history of diabetes, hyper of tension, hypercholesteremia, GERD  EXAM: MYOCARDIAL IMAGING WITH SPECT (REST AND PHARMACOLOGIC-STRESS)  GATED LEFT VENTRICULAR WALL MOTION STUDY  LEFT VENTRICULAR EJECTION FRACTION  TECHNIQUE: Standard myocardial SPECT imaging was performed after resting intravenous injection of 10 mCi [id number]. Subsequently, intravenous infusion of Lexiscan was performed under the supervision of the Cardiology staff. At peak effect of the drug, 30 mCi Tc-952mestamibi was injected intravenously and standard myocardial SPECT imaging was performed. Quantitative gated imaging was also performed to evaluate left ventricular wall motion, and estimate left ventricular ejection fraction.  COMPARISON:  Chest CT- 03/14/2014; 01/27/2014; chest radiograph - 03/13/2014   FINDINGS: Review of the rotational raw images demonstrates moderate GI attenuation on both the provided rest and stress images. There is no significant patient motion artifact.  SPECT imaging demonstrates homogeneous distribution of injected radiotracer without scintigraphic evidence of prior infarction or pharmacologically induced ischemia.  Quantitative gated analysis demonstrates normal wall motion.  The resting left ventricular ejection fraction is 7834%ith end-diastolic volume  of 93 ml and end-systolic volume of 21 ml.  IMPRESSION: 1. No scintigraphic evidence of prior infarction or pharmacologically induced ischemia. 2. Normal wall motion.  Ejection fraction - 78%   Electronically Signed   By: Sandi Mariscal M.D.   On: 03/15/2014 13:23   7/13 2D echo with contrast Left ventricle: The cavity size was normal. Systolic function was normal. The estimated ejection fraction was in the range of 55% to 60%. Wall motion was normal; there were no regional wall motion abnormalities. Left ventricular diastolic function parameters were normal.   Medications: I have reviewed the patient's current medications. Scheduled Meds: . aspirin  325 mg Oral Daily  . azithromycin  500 mg Oral Q24H  . heparin  5,000 Units Subcutaneous 3 times per day  . insulin aspart  0-15 Units Subcutaneous TID WC  . metoprolol tartrate  25 mg Oral BID  . pantoprazole  40 mg Oral BID AC  . simvastatin  80 mg Oral q1800   Continuous Infusions:  PRN Meds:.acetaminophen, gi cocktail, guaiFENesin-codeine, HYDROcodone-acetaminophen, morphine injection, ondansetron (ZOFRAN) IV Assessment/Plan: Principal Problem:   Chest pain with moderate risk of acute coronary syndrome Active Problems:   HTN (hypertension)   Diabetes   GERD (gastroesophageal reflux disease)   CAP (community acquired pneumonia)   Atrial flutter   Pericardial effusion by CT, not seen by echo   Cough  1. Atypical Chest Pain: The etiology of Mr. Delph's chest  pain is unclear. Troponin negative X 3, EKG wnl, echo normal, myoview stress normal so cardiac seems unlikely. UDS only positive for opiates he reported taking for this pain. PE is unlikely as he is not tachycardic with no calf tenderness on exam and no known risk factors. He denies any trauma or chest wall tenderness to palpation in terms of costochondritis. He says this is different than his GERD pain. EKG did not have findings associated with pericarditis and the echo did not demonstrate pericarditis. He is being treated for a possible CAP as his CXR showed RLL infiltrate v atelectasis. Mycoplasma pneumonia is possible as well. CT did not show any consolidation or mass although there were mildly reactive mediastinal lymph nodes. 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 Peripheral blood smear with benign monocytosis which is nonspecific. Malignancy should also be considered. Pulmonary d/ced lisinopril as it may contribute and have scheduled a follow-up for outpatient evaluation of restrictive lung disease. -appreciate cards, pulm -telemetry monitoring  -f/u mycoplasma pneumoniae IgM antibody, quantiferon -ASA 81, metoprolol 25 mg po BID, simvastatin 80 mg po daily  -norco/vicodin 1-2 tabs q6hprn   2. Productive Cough: See above. Mr. Broz notes that his cough has persisted and still is productive of thick grey phlegm. Leukocytosis resolved. His cough may also be related to reflux. Chest CT demonstrated small trace L and R pleural effusions but no consolidation or mass. PFT suggests restrictive lung disease. Mycoplasma remains on differential. Blood smear monocytosis may suggest granulomatous pathology but is nonspecific. Strep pneumo and legionella urine antigens  negative. 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 Asymptomatic UTI: UA is nitrite positive with trace LE and few granular casts. Patient is  still asymptomatic and UCx negative -cont to monitor for symptoms  4. HTN: Mr. Correa BP is 120-130s/60s this morning and is well controlled. Patient thinks ACEI gives him dry cough and this is a different cough, although ACEI is confounding. ACEI was d/ced yesterday per pulm and his BP was fine overnight so hold ARB for now. -d/ced lisinopril due to cough -metoprolol 25 mg po BID, simvastatin 80 mg po daily  5. DM2: HgbA1c is 5.5 and glucose has been 80s-130s  -SSI  -cont to monitor  6. GERD: Patient does not believe his GERD is cause of this pain as he has had GERD for years and this feels different than his usual reflux.  -protonix 40 mg po bid  -GI cocktail QIDPRN  Dispo: Disposition is deferred at this time, awaiting improvement of current medical problems.  Anticipated discharge in approximately 2 day(s).   The patient does have a current PCP Delphina Cahill, MD) and does need an [facility] hospital follow-up appointment after discharge.  The patient does not have transportation limitations that hinder transportation to clinic appointments.  .Services Needed at time of discharge: Y = Yes, Blank = No PT:   OT:   RN:   Equipment:   Other:     LOS: 4 days   Kelby Aline, MD 03/17/2014, 12:10 PM

## 2014-03-17 NOTE — Progress Notes (Signed)
  I have seen and examined the patient myself, and I have reviewed the note by Veryl Speak, MS3 and was present during the interview and physical exam.  Please see my separate H&P for additional findings, assessment, and plan.   Signed: Kelby Aline, MD 03/17/2014, 6:47 AM

## 2014-03-17 NOTE — Discharge Summary (Signed)
Attending physician discharge note: I personally interviewed and examined this patient together with medical resident Dr. Aram Beecham on the day of discharge. History, physical findings, problem formulation and management plan accurate as recorded in his note above. Summary: 63 year old man with indolent onset of a typical left-sided pleuritic chest pain over the last 2 months with initial evaluation in May negative for pulmonary embolus or any other gross pulmonary pathology on a CT angiogram of the chest. In the week prior to the current admission he also developed a hacking cough productive of gray sputum without any fever. He had an elevated ESR 66 mm. A normochromic anemia with hemoglobin 12. Borderline elevation of his total white count with a peripheral blood monocytosis up to 15% of the white cell differential count. No gross abnormalities on review of the peripheral blood film except for increase in benign appearing monocytes. During this admission he had an extensive cardiac evaluation including baseline cardiogram, troponin studies, echocardiogram, and a stress Myoview study all of which were unrevealing for any ischemic cardiac or valvular heart disease. However, he had unexplained cardiomegaly and a small, stable, pericardial effusion with small bilateral pleural effusions. He was seen in consultation by cardiology. A CT scan of the chest was repeated to exclude pulmonary malignancy and was negative. Pulmonary function tests showed a definite restrictive pattern with decreased FEV1, FVC, and DLCO. 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 Atypical infection viral or mycoplasma versus chronic lung toxicity from aluminum exposure are considered in his current differential. He received a course of ceftriaxone and azithromycin. Mycoplasma titers were drawn and results are pending. ANA was negative. Urine Legionella antigen  negative. QuantiFERON test done result pending. A multiple myeloma panel showed normal serum immunoglobulins with no monoclonal spike. Pulmonary consultation was obtained. 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 They will see the patient in followup after discharge. They recommended stopping lisinopril as a possible compounding factor to his cough. They do not feel bronchoscopy was indicated at this time.

## 2014-03-17 NOTE — Discharge Instructions (Signed)
Chest Pain (Nonspecific) It is often hard to give a diagnosis for the cause of chest pain. There is always a chance that your pain could be related to something serious, such as a heart attack or a blood clot in the lungs. You need to follow up with your doctor. HOME CARE  If antibiotic medicine was given, take it as directed by your doctor. Finish the medicine even if you start to feel better.  For the next few days, avoid activities that bring on chest pain. Continue physical activities as told by your doctor.  Do not use any tobacco products. This includes cigarettes, chewing tobacco, and e-cigarettes.  Avoid drinking alcohol.  Only take medicine as told by your doctor.  Follow your doctor's suggestions for more testing if your chest pain does not go away.  Keep all doctor visits you made. GET HELP IF:  Your chest pain does not go away, even after treatment.  You have a rash with blisters on your chest.  You have a fever. GET HELP RIGHT AWAY IF:   You have more pain or pain that spreads to your arm, neck, jaw, back, or belly (abdomen).  You have shortness of breath.  You cough more than usual or cough up blood.  You have very bad back or belly pain.  You feel sick to your stomach (nauseous) or throw up (vomit).  You have very bad weakness.  You pass out (faint).  You have chills. This is an emergency. Do not wait to see if the problems will go away. Call your local emergency services (911 in [place] Do not drive yourself to the hospital. MAKE SURE YOU:   Understand these instructions.  Will watch your condition.  Will get help right away if you are not doing well or get worse. Document Released: 02/05/2008 Document Revised: 08/24/2013 Document Reviewed: 02/05/2008 [facility] Patient Information 2015 [place], [place]. This information is not intended to replace advice given to you by your health care provider. Make sure you discuss any questions you have with your  health care provider.  Cough, Adult  A cough is a reflex. It helps you clear your throat and airways. A cough can help heal your body. A cough can last 2 or 3 weeks (acute) or may last more than 8 weeks (chronic). Some common causes of a cough can include an infection, allergy, or a cold. HOME CARE  Only take medicine as told by your doctor.  If given, take your medicines (antibiotics) as told. Finish them even if you start to feel better.  Use a cold steam vaporizer or humidier in your home. This can help loosen thick spit (secretions).  Sleep so you are almost sitting up (semi-upright). Use pillows to do this. This helps reduce coughing.  Rest as needed.  Stop smoking if you smoke. GET HELP RIGHT AWAY IF:  You have yellowish-white fluid (pus) in your thick spit.  Your cough gets worse.  Your medicine does not reduce coughing, and you are losing sleep.  You cough up blood.  You have trouble breathing.  Your pain gets worse and medicine does not help.  You have a fever. MAKE SURE YOU:   Understand these instructions.  Will watch your condition.  Will get help right away if you are not doing well or get worse. Document Released: 05/02/2011 Document Revised: 11/11/2011 Document Reviewed: 05/02/2011 [facility] Patient Information 2015 [place], [place]. This information is not intended to replace advice given to you by your health care  provider. Make sure you discuss any questions you have with your health care provider.  Pneumonia, Adult Pneumonia is an infection of the lungs. It may be caused by a germ (virus or bacteria). Some types of pneumonia can spread easily from person to person. This can happen when you cough or sneeze. HOME CARE  Only take medicine as told by your doctor.  Take your medicine (antibiotics) as told. Finish it even if you start to feel better.  Do not smoke.  You may use a vaporizer or humidifier in your room. This can help loosen thick spit  (mucus).  Sleep so you are almost sitting up (semi-upright). This helps reduce coughing.  Rest. A shot (vaccine) can help prevent pneumonia. Shots are often advised for:  People over 58 years old.  Patients on chemotherapy.  People with long-term (chronic) lung problems.  People with immune system problems. GET HELP RIGHT AWAY IF:   You are getting worse.  You cannot control your cough, and you are losing sleep.  You cough up blood.  Your pain gets worse, even with medicine.  You have a fever.  Any of your problems are getting worse, not better.  You have shortness of breath or chest pain. MAKE SURE YOU:   Understand these instructions.  Will watch your condition.  Will get help right away if you are not doing well or get worse. Document Released: 02/05/2008 Document Revised: 11/11/2011 Document Reviewed: 11/09/2010 [facility] Patient Information 2015 [place], [place]. This information is not intended to replace advice given to you by your health care provider. Make sure you discuss any questions you have with your health care provider.

## 2014-03-17 NOTE — Progress Notes (Signed)
Reviewed discharge instructions with patient and family, they stated their understanding.  Clarified with Dr. Ethelene Hal patient to take pravastatin and not the simvastatin, patient updated with information.  Discharged home with family via wheelchair by volunteers.  Douglas Mason

## 2014-03-17 NOTE — Discharge Summary (Signed)
Name: Douglas Mason MRN: [id number] DOB: Nov 24, 1950 63 y.o. PCP: Delphina Cahill, MD  Date of Admission: 03/13/2014  8:19 AM Date of Discharge: 03/17/2014 Attending Physician: No att. providers found  Discharge Diagnosis:  Principal Problem:   Chest pain with moderate risk of acute coronary syndrome Active Problems:   HTN (hypertension)   Diabetes   GERD (gastroesophageal reflux disease)   CAP (community acquired pneumonia)   Atrial flutter   Pericardial effusion by CT, not seen by echo   Cough  Discharge Medications:   Medication List         ALPRAZolam 0.5 MG tablet  Commonly known as:  XANAX  Take 1 tablet by mouth 2 (two) times daily as needed. anxiety     aspirin EC 81 MG tablet  Take 81 mg by mouth daily.     azithromycin 500 MG tablet  Commonly known as:  ZITHROMAX  Take 1 tablet (500 mg total) by mouth daily.     guaiFENesin-codeine 100-10 MG/5ML syrup  Take 10 mLs by mouth every 4 (four) hours as needed for cough.     HYDROcodone-acetaminophen 5-325 MG per tablet  Commonly known as:  NORCO/VICODIN  Take 1-2 tablets by mouth every 6 (six) hours as needed for moderate pain.     JANUMET 50-1000 MG per tablet  Generic drug:  sitaGLIPtin-metformin  Take 1 tablet by mouth 2 (two) times daily.     metoprolol tartrate 25 MG tablet  Commonly known as:  LOPRESSOR  Take 1 tablet (25 mg total) by mouth 2 (two) times daily.     multivitamins ther. w/minerals Tabs tablet  Take 1 tablet by mouth daily.     pantoprazole 40 MG tablet  Commonly known as:  PROTONIX  Take 1 tablet (40 mg total) by mouth 2 (two) times daily before a meal.     pravastatin 40 MG tablet  Commonly known as:  PRAVACHOL  Take 1 tablet by mouth daily.        Disposition and follow-up:   Mr.Douglas Mason was discharged from [facility] in Good condition.  At the hospital follow up visit please address:  1.  Cough and shortness of breath  2.  Labs / imaging  needed at time of follow-up: none  3.  Pending labs/ test needing follow-up: quantiferon, mycoplasma IgM antibody  Follow-up Appointments: Follow-up Information   Follow up with Kathee Delton, MD On 05/02/2014. (245 pm)    Specialty:  Pulmonary Disease   Contact information:   [place] [address] [phone]       Follow up with Delphina Cahill, MD On 03/23/2014. (_0 )    Specialty:  Internal Medicine   Contact information:    [place] [address] [phone]       Discharge Instructions: Discharge Instructions   Diet - low sodium heart healthy    Complete by:  As directed      Increase activity slowly    Complete by:  As directed            Consultations: Treatment Team:  Rounding Lbcardiology, MD  Procedures Performed:  Dg Chest 2 View  03/13/2014   CLINICAL DATA:  Productive cough x4 days  EXAM: CHEST  2 VIEW  COMPARISON:  Chest radiograph 03/13/2014  FINDINGS: Stable enlarged cardiac silhouette. There is right lower lobe opacity. Mild central venous congestion. Trace effusions.  IMPRESSION: Right lower lobe infiltrate versus atelectasis.   Electronically  Signed   By: Genevive Bi M.D.   On: 03/13/2014 12:46   Ct Chest W Contrast  03/14/2014   CLINICAL DATA:  Chest pain  EXAM: CT CHEST WITH CONTRAST  TECHNIQUE: Multidetector CT imaging of the chest was performed during intravenous contrast administration.  CONTRAST:  OMNIPAQUE IOHEXOL 300 MG/ML  SOLN  COMPARISON:  CTA chest dated 01/27/2014. Chest radiographs dated 03/13/2014.  FINDINGS: Evaluation of the lung parenchyma is constrained by respiratory motion. No suspicious pulmonary nodules.  Small left and trace right pleural effusions. Associated compressive atelectasis in the bilateral lower lobes. No frank interstitial edema.  Mild eventration of the right hemidiaphragm with associated mild compressive atelectasis in the right middle lobe, likely accounting for the radiographic  abnormality.  No pneumothorax.  Visualized thyroid is unremarkable.  Cardiomegaly. Small pericardial effusion. Coronary atherosclerosis.  Small mediastinal lymph nodes, including a 10 mm short axis right paratracheal node (series 2/ image 13), likely reactive.  Visualized upper abdomen there is notable for cholelithiasis.  Degenerative changes of the visualized thoracolumbar spine.  IMPRESSION: Small left and trace right pleural effusions. No frank interstitial edema.  Eventration of the right hemidiaphragm with mild compressive atelectasis in the right middle lobe, likely accounting for the radiographic abnormality.  Mild bibasilar atelectasis.  Cardiomegaly with small pericardial effusion.   Electronically Signed   By: Charline Bills M.D.   On: 03/14/2014 16:49   Nm Myocar Multi W/spect W/wall Motion / Ef  03/15/2014   CLINICAL DATA:  Chest pain, history of diabetes, hyper of tension, hypercholesteremia, GERD  EXAM: MYOCARDIAL IMAGING WITH SPECT (REST AND PHARMACOLOGIC-STRESS)  GATED LEFT VENTRICULAR WALL MOTION STUDY  LEFT VENTRICULAR EJECTION FRACTION  TECHNIQUE: Standard myocardial SPECT imaging was performed after resting intravenous injection of 10 mCi Tc-40m sestamibi. Subsequently, intravenous infusion of Lexiscan was performed under the supervision of the Cardiology staff. At peak effect of the drug, 30 mCi Tc-61m sestamibi was injected intravenously and standard myocardial SPECT imaging was performed. Quantitative gated imaging was also performed to evaluate left ventricular wall motion, and estimate left ventricular ejection fraction.  COMPARISON:  Chest CT- 03/14/2014; 01/27/2014; chest radiograph - 03/13/2014  FINDINGS: Review of the rotational raw images demonstrates moderate GI attenuation on both the provided rest and stress images. There is no significant patient motion artifact.  SPECT imaging demonstrates homogeneous distribution of injected radiotracer without scintigraphic evidence of  prior infarction or pharmacologically induced ischemia.  Quantitative gated analysis demonstrates normal wall motion.  The resting left ventricular ejection fraction is 78% with end-diastolic volume of 93 ml and end-systolic volume of 21 ml.  IMPRESSION: 1. No scintigraphic evidence of prior infarction or pharmacologically induced ischemia. 2. Normal wall motion.  Ejection fraction - 78%   Electronically Signed   By: Simonne Come M.D.   On: 03/15/2014 13:23   Dg Chest Port 1 View  03/13/2014   CLINICAL DATA:  Short of breath, productive cough  EXAM: PORTABLE CHEST - 1 VIEW  COMPARISON:  01/27/2014  FINDINGS: Normal mediastinum and cardiac silhouette. Normal pulmonary vasculature. No evidence of effusion, infiltrate, or pneumothorax. No acute bony abnormality. Low lung volumes  IMPRESSION: Low lung volumes.  No acute findings   Electronically Signed   By: Genevive Bi M.D.   On: 03/13/2014 09:38    2D Echo: 7/13 Left ventricle: The cavity size was normal. Systolic function wasnormal. The estimated ejection fraction was in the range of 55%to 60%. Wall motion was normal; there were no regional wallmotion abnormalities. Left  ventricular diastolic functionparameters were normal.  7/15 PFT: FEV1 Pre 62, Post 54  FVC Pre 58, Post 49  FEV1/FVC Pre 105, Post 109   Admission HPI: Mr. Mangione is a 63 year old man with HTN, DM2 and GERD who presents with a four day history of chest pain. He says he has had this pain on and off for months with an inconclusive workup at [facility] in 12/2013 but has been worse the past four days. He describes the pain as a sharp pain on his left chest that radiates up to his neck. He has been taking hydrocodone at home which makes it better as does sitting upright. He has a cough that makes it worse as does deep breaths. He says the cough has been for two days and produces thick gray phlegm. He says physical exertion does not make it better or worse. He also reports sweating, orthopnea,  heart palpitations, loss of appetite and some nausea. He denies any fevers, chills, dizziness, syncope, recent trauma, abdominal pain, emesis, diarrhea, or constipation. He says he has had GERD medically managed for years and this feels different.   In the ED, he had negative troponin x 1, EKG no evidence of ischemia, and received 1 ASA 324, zofran 4 mg iv, and 1 L NS bolus.  Hospital Course by problem list: Principal Problem:   Chest pain with moderate risk of acute coronary syndrome Active Problems:   HTN (hypertension)   Diabetes   GERD (gastroesophageal reflux disease)   CAP (community acquired pneumonia)   Atrial flutter   Pericardial effusion by CT, not seen by echo   Cough   #Atypical Chest Pain: Mr, Ponciano had an atypical presentation of chest pain. He reported this was left sided and radiated to his neck with diaphoresis. However, he was negative troponin x 3, EKG with normal sinus rhythm with no evidence of ischemia, normal echo (EF55-60%), and normal stress test without ischemia while followed by cardiology. He was on telemetry which was notable for isolated PVCs. Although he said the pleuritic component was relieved by leaning forward, there were no diffuse ST elevations on EKG suggestive of pericarditis (viral/bacterial). Aortic dissection was deemed unlikely given chronic course of chest pain and the fact that the patient did not endorse tearing pain, no widened mediastinum on CXR. PE deemed unlikely given lack of DVT symptoms (swollen calf, pitting edema, pain, warmth, localized tenderness), risk factors (hypercoagulable state, immobilization, smoking). No trauma or tenderness on chest palpation rules out costochondritis. CAP was considered given his productive cough, leukocytosis (15) , and the CXR suggestive of right lower lobe infiltrate. Mycoplasma is also considered and antibody pending. He was started on Ceftriaxone 1 g IV Q25H and Azithromycin 57m IV Q24H (7/12-7/15). As  more information became available, the probability of pneumonia fell, but antibiotics appeared to help with pain and reduced the white count, therefore he is going home with Azithromycin tablet 5059mPO Q24H for five days. Chest CT (not high contrast) showed small bilateral pleural effusions and pericardial fluid. He was seen by pulmonology and had a PFT demonstrating reduced lung volumes, increased FEV1/FVC ratio and diffusion defect suggest an interstitial process such as fibrosis or interstitial inflammation. This combined with the elevated ESR of 66 and chronic nature of pain point to a more chronic scarring and inflammatory process. Also considered was autoimmune causes (negative ANA). 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 He will follow-up with  pulmonology. He took ASA 325, metoprolol 25 mg BID and simvastatin 80 mg daily. Lisinopril was discontinued as it may have contributed to his cough and ARB was not placed as BP was well controlled.  #Productive cough likely secondary to interstitial lung disease: See above. Due to chest x ray suggestive of right lower lobe infiltrate, high WBC, chronic nature of the chest pain, and new onset pleuritic chest pain, patient was considered to possibly have community acquired v mycoplasma pneumonia. Begun on Ceftriaxone/Azithromycin (7/12-7/15) and given guaiFENesin-codeine for cough suppression. WBC was 15.0 on admission (7/12) and is down to 9.3 on discharge (7/16). Pulmonary function tests on 03/16/14 suggested an interstitial process such as fibrosis or interstitial inflammation, likely due to occupational exposure to aluminum dust. Cough productive of gray sputum continued throughout the duration of hospital stay. Pulmonary consult was obtained due to unclear etiology of cough. Per their note, they believe the cough to be due to a combination of viral etiology, GERD exacerbation, and ACE  inhibitors. Pulmonary discontinued Lisinopril and went up on protonix to 40 mg BID. He was discharged with guaifenisen-codeine syrup and five days of azithromycin 500 mg po daily as he was responding well to antibiotics inpatient. Pulmonary will follow up with patient in an outpatient setting to evaluate for presence of cough and for management of restrictive lung disease.   #Hypertension: Patient has a history of hypertension, for which he received Lisinopril 34m PO daily, Metoprolol tartrate 227mPO twice daily, Simvastatin 8090mO daily, and heart healthy/carb modified diet during the course of his hospital stay. Pulmonary discontinued Lisinopril due to cough. His hypertension was well controlled, ranging from 110s-140s/60s-70s.  #Diabetes mellitus type II: Patient has a history of type 2 diabetes mellitus (A1c: 5.5%, 7/12). He received CBG monitoring during the course of his stay, as well as sliding scale insulin. Glucose ranged from 86 - 184.  #GERD: History of GERD, which has been stable for many years, per patient. During the course of his hospital stay he received Protonix 62m26m daily which was increased to BID. Per pulmonary, his cough may be attributed to his reflux and should be maintained at this dose.  #Asymptomatic UTI: UA on 03/13/14 was nitrite positive with trace LE and few granular casts. He has remained asymptomatic and the urine culture is negative. He was monitored for symptoms during the duration of his stay.  #Normocytic anemia with monocytosis: On admission was found to have a hemoglobin of 11.8, WBC of 15.0, elevated absolute neutrophils at 10.7, elevated relative monocytes at 13, elevated absolute monocytes of 2.0. Potential etiologies of monocytosis include infection (tuberculosis), autoimmune condition, a myelodysplastic disorder, or nonspecific process. ANA was negative (7/14). Multiple myeloma panel was negative (7/15). Quantiferon gold TB assay is still pending.     Discharge Vitals:   BP 124/60  Pulse 77  Temp(Src) 98.2 F (36.8 C) (Oral)  Resp 20  Ht 6' (1.829 m)  Wt 236 lb 12.6 oz (107.406 kg)  BMI 32.11 kg/m2  SpO2 94%  Discharge Labs:  Results for orders placed during the hospital encounter of 03/13/14 (from the past 24 hour(s))  GLUCOSE, CAPILLARY     Status: Abnormal   Collection Time    03/16/14  4:42 PM      Result Value Ref Range   Glucose-Capillary 115 (*) 70 - 99 mg/dL  GLUCOSE, CAPILLARY     Status: Abnormal   Collection Time    03/16/14  9:32 PM      Result  Value Ref Range   Glucose-Capillary 122 (*) 70 - 99 mg/dL   Comment 1 Notify RN    CBC     Status: Abnormal   Collection Time    03/17/14  6:24 AM      Result Value Ref Range   WBC 9.3  4.0 - 10.5 K/uL   RBC 3.70 (*) 4.22 - 5.81 MIL/uL   Hemoglobin 11.4 (*) 13.0 - 17.0 g/dL   HCT 33.7 (*) 39.0 - 52.0 %   MCV 91.1  78.0 - 100.0 fL   MCH 30.8  26.0 - 34.0 pg   MCHC 33.8  30.0 - 36.0 g/dL   RDW 15.1  11.5 - 15.5 %   Platelets    150 - 400 K/uL   Value: PLATELET CLUMPS NOTED ON SMEAR, COUNT APPEARS ADEQUATE  BASIC METABOLIC PANEL     Status: Abnormal   Collection Time    03/17/14  6:24 AM      Result Value Ref Range   Sodium 139  137 - 147 mEq/L   Potassium 4.7  3.7 - 5.3 mEq/L   Chloride 96  96 - 112 mEq/L   CO2 27  19 - 32 mEq/L   Glucose, Bld 135 (*) 70 - 99 mg/dL   BUN 13  6 - 23 mg/dL   Creatinine, Ser 0.80  0.50 - 1.35 mg/dL   Calcium 9.0  8.4 - 10.5 mg/dL   GFR calc non Af Amer >90  >90 mL/min   GFR calc Af Amer >90  >90 mL/min   Anion gap 16 (*) 5 - 15  GLUCOSE, CAPILLARY     Status: Abnormal   Collection Time    03/17/14  7:24 AM      Result Value Ref Range   Glucose-Capillary 137 (*) 70 - 99 mg/dL   Comment 1 Documented in Chart     Comment 2 Notify RN     Peripheral blood smear: benign monocytosis 7/14 ANA negative 7/13 multiple myeloma panel: tot protein 6.6, albumin elp 49.8, alpha1globulin 7, alpha2globulin 13.3, beta globulin 6.5,  beta 2 6.8, gamma globulin 16.6, m spike not detected, SPE nonspecific pattern, IgG 1130, IgA 158, IgM 90 7/12 strep pneumo and legionella urine antigens negative Troponin I negative x 3 HgbA1c 5.5 TSH 1.33 Lipid Panel cholesterol 122, triglyceride 68, HDL 34, VLDL 14, LDL 74 ESR 66 ProBNP 190 Lipase 29  7/12 UA nitrite positive, trace LE Results for orders placed during the hospital encounter of 03/13/14  URINE CULTURE     Status: None   Collection Time    03/13/14 11:15 AM      Result Value Ref Range Status   Specimen Description URINE, RANDOM   Final   Special Requests NONE   Final   Culture  Setup Time     Final   Value: 03/13/2014 23:25     Performed at [facility]     Final   Value: NO GROWTH     Performed at [facility]   Culture     Final   Value: NO GROWTH     Performed at [facility]   Report Status 03/14/2014 FINAL   Final     Discharge Physical Exam: General Appearance: Alert, cooperative, no distress, appears stated age  HEENT: Normocephalic, without obvious abnormality, atraumatic, PERRL, moist mucous membranes  Back: Symmetric, no curvature  Lungs: Clear to auscultation bilaterally, respirations unlabored  Chest wall: No tenderness or  deformity  Heart: Regular rate and rhythm, S1 and S2 normal, no murmur, rub or gallop  Abdomen: Soft, non-tender, bowel sounds active all four quadrants, no masses, no organomegaly  Extremities: Extremities normal, atraumatic, no cyanosis or edema  Pulses: 2+ and symmetric radial, DP, PT pulses   Signed: Kelby Aline, MD 03/17/2014, 4:01 PM    Services Ordered on Discharge: none Equipment Ordered on Discharge: none

## 2014-03-18 LAB — QUANTIFERON TB GOLD ASSAY (BLOOD)
Interferon Gamma Release Assay: NEGATIVE
Mitogen value: 1.39 IU/mL
QUANTIFERON NIL VALUE: 0.01 [IU]/mL
TB AG VALUE: 0.01 [IU]/mL
TB Antigen Minus Nil Value: 0 IU/mL

## 2014-03-19 LAB — MYCOPLASMA PNEUMONIAE ANTIBODY, IGM: MYCOPLASMA PNEUMO IGM: 72 U/mL (ref <770)

## 2014-03-22 ENCOUNTER — Encounter (INDEPENDENT_AMBULATORY_CARE_PROVIDER_SITE_OTHER): Payer: Self-pay | Admitting: *Deleted

## 2014-04-05 ENCOUNTER — Institutional Professional Consult (permissible substitution) (INDEPENDENT_AMBULATORY_CARE_PROVIDER_SITE_OTHER): Payer: BC Managed Care – PPO | Admitting: Internal Medicine

## 2014-04-11 ENCOUNTER — Emergency Department (HOSPITAL_COMMUNITY): Payer: BC Managed Care – PPO

## 2014-04-11 ENCOUNTER — Encounter (HOSPITAL_COMMUNITY): Payer: Self-pay | Admitting: Emergency Medicine

## 2014-04-11 ENCOUNTER — Emergency Department (HOSPITAL_COMMUNITY)
Admission: EM | Admit: 2014-04-11 | Discharge: 2014-04-12 | Disposition: A | Discharge status: Home / Self Care | Payer: BC Managed Care – PPO | Attending: Emergency Medicine | Admitting: Emergency Medicine

## 2014-04-11 ENCOUNTER — Other Ambulatory Visit: Payer: Self-pay

## 2014-04-11 DIAGNOSIS — R0789 Other chest pain: Secondary | ICD-10-CM | POA: Diagnosis not present

## 2014-04-11 DIAGNOSIS — R079 Chest pain, unspecified: Secondary | ICD-10-CM | POA: Diagnosis present

## 2014-04-11 DIAGNOSIS — Z7982 Long term (current) use of aspirin: Secondary | ICD-10-CM | POA: Insufficient documentation

## 2014-04-11 DIAGNOSIS — Z8701 Personal history of pneumonia (recurrent): Secondary | ICD-10-CM | POA: Insufficient documentation

## 2014-04-11 DIAGNOSIS — Z79899 Other long term (current) drug therapy: Secondary | ICD-10-CM | POA: Insufficient documentation

## 2014-04-11 DIAGNOSIS — Z87891 Personal history of nicotine dependence: Secondary | ICD-10-CM | POA: Insufficient documentation

## 2014-04-11 DIAGNOSIS — E7800 Pure hypercholesterolemia, unspecified: Secondary | ICD-10-CM | POA: Insufficient documentation

## 2014-04-11 DIAGNOSIS — E119 Type 2 diabetes mellitus without complications: Secondary | ICD-10-CM | POA: Insufficient documentation

## 2014-04-11 DIAGNOSIS — I10 Essential (primary) hypertension: Secondary | ICD-10-CM | POA: Insufficient documentation

## 2014-04-11 DIAGNOSIS — K219 Gastro-esophageal reflux disease without esophagitis: Secondary | ICD-10-CM | POA: Diagnosis not present

## 2014-04-11 HISTORY — DX: Pure hypercholesterolemia, unspecified: E78.00

## 2014-04-11 HISTORY — DX: Pneumonia, unspecified organism: J18.9

## 2014-04-11 LAB — BASIC METABOLIC PANEL
Anion gap: 14 (ref 5–15)
BUN: 19 mg/dL (ref 6–23)
CHLORIDE: 100 meq/L (ref 96–112)
CO2: 24 mEq/L (ref 19–32)
CREATININE: 0.82 mg/dL (ref 0.50–1.35)
Calcium: 8.8 mg/dL (ref 8.4–10.5)
GFR calc Af Amer: >90 mL/min (ref >90)
GFR calc non Af Amer: >90 mL/min (ref >90)
Glucose, Bld: 174 mg/dL — ABNORMAL HIGH (ref 70–99)
Potassium: 3.8 mEq/L (ref 3.7–5.3)
Sodium: 138 mEq/L (ref 137–147)

## 2014-04-11 LAB — CBC WITH DIFFERENTIAL/PLATELET
BASOS PCT: 0 % (ref 0–1)
Basophils Absolute: 0 10*3/uL (ref 0.0–0.1)
EOS PCT: 7 % — AB (ref 0–5)
Eosinophils Absolute: 0.6 10*3/uL (ref 0.0–0.7)
HEMATOCRIT: 36.6 % — AB (ref 39.0–52.0)
HEMOGLOBIN: 12 g/dL — AB (ref 13.0–17.0)
Lymphocytes Relative: 20 % (ref 12–46)
Lymphs Abs: 1.8 10*3/uL (ref 0.7–4.0)
MCH: 29.5 pg (ref 26.0–34.0)
MCHC: 32.8 g/dL (ref 30.0–36.0)
MCV: 89.9 fL (ref 78.0–100.0)
MONO ABS: 1.2 10*3/uL — AB (ref 0.1–1.0)
MONOS PCT: 13 % — AB (ref 3–12)
NEUTROS ABS: 5.4 10*3/uL (ref 1.7–7.7)
Neutrophils Relative %: 60 % (ref 43–77)
Platelets: 322 10*3/uL (ref 150–400)
RBC: 4.07 MIL/uL — ABNORMAL LOW (ref 4.22–5.81)
RDW: 16 % — ABNORMAL HIGH (ref 11.5–15.5)
WBC: 9 10*3/uL (ref 4.0–10.5)

## 2014-04-11 LAB — TROPONIN I: Troponin I: <0.3 ng/mL (ref <0.30)

## 2014-04-11 NOTE — ED Notes (Signed)
Pt with left chest pain recurrent, this time started last night and per pt has been seen here for same, unable to take deep breathes

## 2014-04-11 NOTE — ED Provider Notes (Addendum)
CSN: [id number]     Arrival date & time 04/11/14  2125 History  This chart was scribed for Wynetta Fines, MD by Starleen Arms, ED Scribe. This patient was seen in room APA14/AP[facility]14 and the patient's care was started at 11:09 PM.    Chief Complaint  Patient presents with  . Chest Pain   The history is provided by the patient. No language interpreter was used.    HPI Comments: Douglas Mason is a 63 y.o. male who was admitted mid-July for chest pain and had a cardiac stress test negative for ischemia and echocardiogram with no evidence of ischemic changes. He also had a CT angio chest negative for pulmonary embolism. He presents to the Emergency Department complaining of intermittent left-sided chest pain that began 6:30 this evening. This is the same location as the pain he had in July but more severe. He states the pain then eased but returned with greater severity but subsequently has resolved after taking Tylenol at 8PM and he is asymptomatic now. Patient describes the pain as being "like a knife" and states it was difficult  to take a deep breath due to pain. It was worse with breathing or movement  Patient denies SOB, nausea or diaphoresis.      Past Medical History  Diagnosis Date  . Hypertension   . Diabetes mellitus without complication   . GERD (gastroesophageal reflux disease)   . Pneumonia   . High cholesterol    History reviewed. No pertinent past surgical history. History reviewed. No pertinent family history. History  Substance Use Topics  . Smoking status: Former Research scientist (life sciences)  . Smokeless tobacco: Not on file  . Alcohol Use: No    Review of Systems  A complete 10 system review of systems was obtained and all systems are negative except as noted in the HPI and PMH.    Allergies  Review of patient's allergies indicates no known allergies.  Home Medications   Prior to Admission medications   Medication Sig Start Date End Date Taking? Authorizing Provider  ALPRAZolam  Duanne Moron) 0.5 MG tablet Take 1 tablet by mouth 2 (two) times daily as needed. anxiety 01/12/14  Yes Historical Provider, MD  aspirin EC 81 MG tablet Take 81 mg by mouth daily.   Yes Historical Provider, MD  HYDROcodone-acetaminophen (NORCO/VICODIN) 5-325 MG per tablet Take 1-2 tablets by mouth every 6 (six) hours as needed for moderate pain. 01/27/14  Yes Fredia Sorrow, MD  JANUMET 50-1000 MG per tablet Take 1 tablet by mouth 2 (two) times daily. 01/12/14  Yes Historical Provider, MD  metoprolol tartrate (LOPRESSOR) 25 MG tablet Take 1 tablet (25 mg total) by mouth 2 (two) times daily. 03/17/14  Yes Kelby Aline, MD  Multiple Vitamins-Minerals (MULTIVITAMINS THER. W/MINERALS) TABS tablet Take 1 tablet by mouth daily.   Yes Historical Provider, MD  pantoprazole (PROTONIX) 40 MG tablet Take 1 tablet (40 mg total) by mouth 2 (two) times daily before a meal. 03/17/14  Yes Kelby Aline, MD  pravastatin (PRAVACHOL) 40 MG tablet Take 1 tablet by mouth daily. 01/01/14  Yes Historical Provider, MD   BP 121/71  Pulse 84  Resp 21  Ht 6' (1.829 m)  Wt 234 lb (106.142 kg)  BMI 31.73 kg/m2  SpO2 98%  Physical Exam  General: Well-developed, well-nourished male in no acute distress; appearance consistent with age of record HENT: normocephalic; atraumatic Eyes: pupils equal, round and reactive to light; extraocular muscles intact Neck: supple Heart: regular rate  and rhythm; no murmurs, rubs or gallops Lungs: clear to auscultation bilaterally; rales in left base Chest: nontender Abdomen: soft; nondistended; nontender; no masses or hepatosplenomegaly; bowel sounds present Extremities: No deformity; full range of motion; pulses normal; trace edema of lower legs Neurologic: Awake, alert and oriented; motor function intact in all extremities and symmetric; no facial droop Skin: Warm and dry Psychiatric: Normal mood and affect  ED Course  Procedures (including critical care time) DIAGNOSTIC STUDIES: Oxygen  Saturation is 93% on RA, adequate by my interpretation.    COORDINATION OF CARE:  11:20 PM-Discussed treatment plan with pt at bedside and pt agreed to plan.     MDM    EKG Interpretation  Date/Time:  Monday April 11 2014 21:37:38 EDT Ventricular Rate:  104 PR Interval:  175 QRS Duration: 87 QT Interval:  333 QTC Calculation: 438 R Axis:   35 Text Interpretation:  Sinus tachycardia Nonspecific T abnormalities, diffuse leads Baseline wander in lead(s) II aVF Rate is faster Confirmed by Anita Laguna  MD, Jenny Reichmann ([id number]) on 04/11/2014 11:36:13 PM       Nursing notes and vitals signs, including pulse oximetry, reviewed.  Summary of this visit's results, reviewed by myself:  Labs:  Results for orders placed during the hospital encounter of 04/11/14 (from the past 24 hour(s))  CBC WITH DIFFERENTIAL     Status: Abnormal   Collection Time    04/11/14  9:33 PM      Result Value Ref Range   WBC 9.0  4.0 - 10.5 K/uL   RBC 4.07 (*) 4.22 - 5.81 MIL/uL   Hemoglobin 12.0 (*) 13.0 - 17.0 g/dL   HCT 36.6 (*) 39.0 - 52.0 %   MCV 89.9  78.0 - 100.0 fL   MCH 29.5  26.0 - 34.0 pg   MCHC 32.8  30.0 - 36.0 g/dL   RDW 16.0 (*) 11.5 - 15.5 %   Platelets 322  150 - 400 K/uL   Neutrophils Relative % 60  43 - 77 %   Neutro Abs 5.4  1.7 - 7.7 K/uL   Lymphocytes Relative 20  12 - 46 %   Lymphs Abs 1.8  0.7 - 4.0 K/uL   Monocytes Relative 13 (*) 3 - 12 %   Monocytes Absolute 1.2 (*) 0.1 - 1.0 K/uL   Eosinophils Relative 7 (*) 0 - 5 %   Eosinophils Absolute 0.6  0.0 - 0.7 K/uL   Basophils Relative 0  0 - 1 %   Basophils Absolute 0.0  0.0 - 0.1 K/uL  BASIC METABOLIC PANEL     Status: Abnormal   Collection Time    04/11/14 10:30 PM      Result Value Ref Range   Sodium 138  137 - 147 mEq/L   Potassium 3.8  3.7 - 5.3 mEq/L   Chloride 100  96 - 112 mEq/L   CO2 24  19 - 32 mEq/L   Glucose, Bld 174 (*) 70 - 99 mg/dL   BUN 19  6 - 23 mg/dL   Creatinine, Ser 0.82  0.50 - 1.35 mg/dL   Calcium 8.8  8.4 -  10.5 mg/dL   GFR calc non Af Amer >90  >90 mL/min   GFR calc Af Amer >90  >90 mL/min   Anion gap 14  5 - 15  TROPONIN I     Status: None   Collection Time    04/11/14 10:30 PM      Result Value Ref Range  Troponin I <0.30  <0.30 ng/mL  TROPONIN I     Status: None   Collection Time    04/12/14  1:09 AM      Result Value Ref Range   Troponin I <0.30  <0.30 ng/mL    Imaging Studies: Dg Chest Port 1 View  04/11/2014   CLINICAL DATA:  Chest pain.  EXAM: PORTABLE CHEST - 1 VIEW  COMPARISON:  03/13/2014  FINDINGS: Lungs are hypoinflated with subtle hazy density in the left base which may be due to small effusions/ atelectasis and less likely infection. There is stable cardiomegaly. Remainder the exam is unchanged.  IMPRESSION: Mild hazy left base opacification which may be due to small effusions/atelectasis although cannot exclude infection.  Stable cardiomegaly.   Electronically Signed   By: Marin Olp M.D.   On: 04/11/2014 21:57   2:01 AM Patient has some mild left lower chest pain with movement. He has an appointment with his primary care physician Dr. [name] tomorrow. Two cardiac markers are negative, the latter about 6 hours after resolution of pain.  I personally performed the services described in this documentation, which was scribed in my presence. The recorded information has been reviewed and is accurate.   Wynetta Fines, MD 04/12/14 0202  Wynetta Fines, MD 04/12/14 [phone]

## 2014-04-12 LAB — TROPONIN I: Troponin I: <0.3 ng/mL (ref <0.30)

## 2014-04-12 MED ORDER — HYDROCODONE-ACETAMINOPHEN 5-325 MG PO TABS: 1.0000 | ORAL_TABLET | Freq: Four times a day (QID) | ORAL | Status: DC | PRN

## 2014-04-12 NOTE — Discharge Instructions (Signed)

## 2014-04-22 MED FILL — Hydrocodone-Acetaminophen Tab 5-325 MG: ORAL | Qty: 6 | Status: AC

## 2014-05-02 ENCOUNTER — Ambulatory Visit (INDEPENDENT_AMBULATORY_CARE_PROVIDER_SITE_OTHER): Payer: BC Managed Care – PPO | Admitting: Pulmonary Disease

## 2014-05-02 ENCOUNTER — Encounter: Payer: Self-pay | Admitting: Pulmonary Disease

## 2014-05-02 VITALS — BP 106/58 | HR 98 | Temp 98.1°F | Ht 72.0 in | Wt 232.0 lb

## 2014-05-02 DIAGNOSIS — G4733 Obstructive sleep apnea (adult) (pediatric): Secondary | ICD-10-CM | POA: Insufficient documentation

## 2014-05-02 NOTE — Progress Notes (Signed)
   Subjective:    Patient ID: Douglas Mason, male    DOB: 09-Jun-1951, 62 y.o.   MRN: [id number]  HPI The patient is a 63 year old male who I've been asked to see for possible obstructive sleep apnea. He was recently in the hospital for various pulmonary issues, and the question was raised whether he may have sleep apnea. He has been noted to have loud snoring by his wife, as well as an abnormal breathing pattern during sleep. He also awakens at least 2 times a night for unknown reasons. Despite this, he feels that he is rested in the mornings upon arising, and denies any sleepiness during the first part of a day. However, in the afternoon, he admits that he will get sleepy trying to watch television but not reading. He has no issues with sleepiness in the evening if sitting, and denies any sleepiness with driving short or long distances.  The patient states that his weight is down 10 pounds over the last few years, and his Epworth score today is 4.     Review of Systems  Constitutional: Negative for fever and unexpected weight change.  HENT: Positive for congestion. Negative for dental problem, ear pain, nosebleeds, postnasal drip, rhinorrhea, sinus pressure, sneezing, sore throat and trouble swallowing.   Eyes: Negative for redness and itching.  Respiratory: Positive for cough, chest tightness, shortness of breath and wheezing.   Cardiovascular: Positive for palpitations and leg swelling.  Gastrointestinal: Negative for nausea and vomiting.  Genitourinary: Negative for dysuria.  Musculoskeletal: Negative for joint swelling.  Skin: Negative for rash.  Neurological: Negative for headaches.  Hematological: Does not bruise/bleed easily.  Psychiatric/Behavioral: Negative for dysphoric mood. The patient is not nervous/anxious.        Objective:   Physical Exam Constitutional:  Overweight male, no acute distress  HENT:  Nares patent without discharge  Oropharynx without exudate, palate  and uvula are moderately elongated.  Eyes:  Perrla, eomi, no scleral icterus  Neck:  No JVD, no TMG  Cardiovascular:  Normal rate, regular rhythm, no rubs or gallops.  No murmurs        Intact distal pulses  Pulmonary :  Normal breath sounds, no stridor or respiratory distress   No rales, rhonchi, or wheezing  Abdominal:  Soft, nondistended, bowel sounds present.  No tenderness noted.   Musculoskeletal:  No lower extremity edema noted.  Lymph Nodes:  No cervical lymphadenopathy noted  Skin:  No cyanosis noted  Neurologic:  Alert, appropriate, moves all 4 extremities without obvious deficit.         Assessment & Plan:

## 2014-05-02 NOTE — Patient Instructions (Signed)
Will schedule for home sleep testing, and will review once the results are available. Work on weight reduction. Will speak with Dr. [name], and will be more than happy to see you for pulmonary issues if he feels this is needed.

## 2014-05-02 NOTE — Assessment & Plan Note (Signed)
The patient's history is suspicious for clinically significant sleep apnea, although he is not having severe daytime sleepiness. I have had a long discussion with him about sleep apnea, including its impact to his quality of life and cardiovascular health. I do think he needs a sleep study for evaluation, and he is a good candidate for home sleep testing. The patient is agreeable to this approach.

## 2014-05-03 ENCOUNTER — Telehealth: Payer: Self-pay | Admitting: Pulmonary Disease

## 2014-05-03 NOTE — Telephone Encounter (Signed)
Per [facility] schedule the patient for a pulmonary consult with Dr. Gwenette Greet per Dr. Delphina Cahill. He states to schedule this after he has home sleep test, which will be in around 6 weeks. I called the pt to schedule and he states that he has an appt with Dr. [name] on Thursday and wants to hold off until then. Dent is aware. Micanopy Bing, CMA

## 2014-05-12 ENCOUNTER — Other Ambulatory Visit (HOSPITAL_COMMUNITY): Payer: Self-pay | Admitting: Internal Medicine

## 2014-06-28 ENCOUNTER — Emergency Department (HOSPITAL_COMMUNITY): Payer: BC Managed Care – PPO

## 2014-06-28 ENCOUNTER — Emergency Department (HOSPITAL_COMMUNITY)
Admission: EM | Admit: 2014-06-28 | Discharge: 2014-06-29 | Disposition: A | Discharge status: Home / Self Care | Payer: BC Managed Care – PPO | Attending: Emergency Medicine | Admitting: Emergency Medicine

## 2014-06-28 ENCOUNTER — Encounter (HOSPITAL_COMMUNITY): Payer: Self-pay | Admitting: Emergency Medicine

## 2014-06-28 DIAGNOSIS — E119 Type 2 diabetes mellitus without complications: Secondary | ICD-10-CM | POA: Insufficient documentation

## 2014-06-28 DIAGNOSIS — K219 Gastro-esophageal reflux disease without esophagitis: Secondary | ICD-10-CM | POA: Diagnosis not present

## 2014-06-28 DIAGNOSIS — Z7982 Long term (current) use of aspirin: Secondary | ICD-10-CM | POA: Insufficient documentation

## 2014-06-28 DIAGNOSIS — R0602 Shortness of breath: Secondary | ICD-10-CM | POA: Diagnosis present

## 2014-06-28 DIAGNOSIS — J209 Acute bronchitis, unspecified: Secondary | ICD-10-CM | POA: Diagnosis not present

## 2014-06-28 DIAGNOSIS — Z8701 Personal history of pneumonia (recurrent): Secondary | ICD-10-CM | POA: Insufficient documentation

## 2014-06-28 DIAGNOSIS — I10 Essential (primary) hypertension: Secondary | ICD-10-CM | POA: Insufficient documentation

## 2014-06-28 DIAGNOSIS — Z87891 Personal history of nicotine dependence: Secondary | ICD-10-CM | POA: Diagnosis not present

## 2014-06-28 DIAGNOSIS — Z79899 Other long term (current) drug therapy: Secondary | ICD-10-CM | POA: Diagnosis not present

## 2014-06-28 DIAGNOSIS — J40 Bronchitis, not specified as acute or chronic: Secondary | ICD-10-CM

## 2014-06-28 DIAGNOSIS — R609 Edema, unspecified: Secondary | ICD-10-CM | POA: Diagnosis not present

## 2014-06-28 LAB — CBC
HEMATOCRIT: 33.8 % — AB (ref 39.0–52.0)
Hemoglobin: 11.3 g/dL — ABNORMAL LOW (ref 13.0–17.0)
MCH: 29.8 pg (ref 26.0–34.0)
MCHC: 33.4 g/dL (ref 30.0–36.0)
MCV: 89.2 fL (ref 78.0–100.0)
Platelets: 339 10*3/uL (ref 150–400)
RBC: 3.79 MIL/uL — ABNORMAL LOW (ref 4.22–5.81)
RDW: 16.3 % — ABNORMAL HIGH (ref 11.5–15.5)
WBC: 7.3 10*3/uL (ref 4.0–10.5)

## 2014-06-28 LAB — BASIC METABOLIC PANEL
Anion gap: 12 (ref 5–15)
BUN: 16 mg/dL (ref 6–23)
CHLORIDE: 101 meq/L (ref 96–112)
CO2: 26 meq/L (ref 19–32)
CREATININE: 1.15 mg/dL (ref 0.50–1.35)
Calcium: 8.6 mg/dL (ref 8.4–10.5)
GFR calc Af Amer: 76 mL/min — ABNORMAL LOW (ref >90)
GFR calc non Af Amer: 65 mL/min — ABNORMAL LOW (ref >90)
Glucose, Bld: 184 mg/dL — ABNORMAL HIGH (ref 70–99)
Potassium: 4.2 mEq/L (ref 3.7–5.3)
Sodium: 139 mEq/L (ref 137–147)

## 2014-06-28 LAB — TROPONIN I: Troponin I: <0.3 ng/mL (ref <0.30)

## 2014-06-28 MED ORDER — IPRATROPIUM-ALBUTEROL 0.5-2.5 (3) MG/3ML IN SOLN
3.0000 mL | Freq: Once | RESPIRATORY_TRACT | Status: AC
  Administered 2014-06-28: 3 mL via RESPIRATORY_TRACT
  Filled 2014-06-28: qty 3

## 2014-06-28 MED ORDER — PREDNISONE 50 MG PO TABS
60.0000 mg | ORAL_TABLET | Freq: Once | ORAL | Status: AC
  Administered 2014-06-28: 60 mg via ORAL
  Filled 2014-06-28 (×2): qty 1

## 2014-06-28 MED ORDER — PREDNISONE 20 MG PO TABS: 40.0000 mg | ORAL_TABLET | Freq: Every day | ORAL | Status: DC

## 2014-06-28 MED ORDER — AZITHROMYCIN 250 MG PO TABS: 250.0000 mg | ORAL_TABLET | Freq: Every day | ORAL | Status: DC

## 2014-06-28 MED ORDER — ALBUTEROL SULFATE HFA 108 (90 BASE) MCG/ACT IN AERS: 2.0000 | INHALATION_SPRAY | RESPIRATORY_TRACT | Status: AC | PRN

## 2014-06-28 MED ORDER — ALBUTEROL SULFATE (2.5 MG/3ML) 0.083% IN NEBU
5.0000 mg | INHALATION_SOLUTION | Freq: Once | RESPIRATORY_TRACT | Status: AC
  Administered 2014-06-28: 5 mg via RESPIRATORY_TRACT
  Filled 2014-06-28: qty 6

## 2014-06-28 NOTE — ED Notes (Addendum)
Pt. Sat 89% on room air. Pt. Placed on 2L [place]. RT paged for breathing treatment.

## 2014-06-28 NOTE — ED Notes (Signed)
Patient states that he had a cold and it got worse, started having shortness of breath and wheezing. Used his inhaler without relief (that was given to him when he had pneumonia).

## 2014-06-28 NOTE — ED Provider Notes (Signed)
CSN: [id number]     Arrival date & time 06/28/14  2012 History   First MD Initiated Contact with Patient 06/28/14 2206     Chief Complaint  Patient presents with  . Shortness of Breath     (Consider location/radiation/quality/duration/timing/severity/associated sxs/prior Treatment) HPI  62yM with wheezing and SOB. Cold like symptoms for past week. Wheezing/dyspnea starting yesterday. Productive cough. No fever or chills. Denies CP or back pain. No n/v. Reports dependent edema when standing for long periods of time at work which resolved with elevation. No acute change. No sick contacts.   Past Medical History  Diagnosis Date  . Hypertension   . Diabetes mellitus without complication   . GERD (gastroesophageal reflux disease)   . Pneumonia   . High cholesterol    History reviewed. No pertinent past surgical history. Family History  Problem Relation Age of Onset  . Asthma Mother   . Leukemia Mother    History  Substance Use Topics  . Smoking status: Former Smoker -- 2.00 packs/day for 30 years    Types: Cigarettes    Quit date: 09/02/1976  . Smokeless tobacco: Former Systems developer    Types: Chew    Quit date: 09/03/2003  . Alcohol Use: No    Review of Systems  All systems reviewed and negative, other than as noted in HPI.   Allergies  Review of patient's allergies indicates no known allergies.  Home Medications   Prior to Admission medications   Medication Sig Start Date End Date Taking? Authorizing Provider  albuterol (PROVENTIL HFA;VENTOLIN HFA) 108 (90 BASE) MCG/ACT inhaler Inhale 2 puffs into the lungs every 6 (six) hours as needed for wheezing or shortness of breath.   Yes Historical Provider, MD  ALPRAZolam Duanne Moron) 0.5 MG tablet Take 1 tablet by mouth at bedtime as needed. anxiety 01/12/14  Yes Historical Provider, MD  amLODipine (NORVASC) 5 MG tablet Take 5 mg by mouth daily. 06/02/14  Yes Historical Provider, MD  aspirin EC 81 MG tablet Take 81 mg by mouth daily.    Yes Historical Provider, MD  HYDROcodone-acetaminophen (NORCO) 5-325 MG per tablet Take 1 tablet by mouth every 6 (six) hours as needed (for pain). 04/12/14  Yes John L Molpus, MD  HYDROcodone-homatropine (HYCODAN) 5-1.5 MG/5ML syrup Take 5 mLs by mouth every 6 (six) hours as needed for cough.   Yes Historical Provider, MD  JANUMET 50-1000 MG per tablet Take 1 tablet by mouth 2 (two) times daily. 01/12/14  Yes Historical Provider, MD  Multiple Vitamins-Minerals (MULTIVITAMINS THER. W/MINERALS) TABS tablet Take 1 tablet by mouth daily.   Yes Historical Provider, MD  pantoprazole (PROTONIX) 40 MG tablet Take 1 tablet (40 mg total) by mouth 2 (two) times daily before a meal. 03/17/14  Yes Kelby Aline, MD  pravastatin (PRAVACHOL) 40 MG tablet Take 1 tablet by mouth daily. 01/01/14  Yes Historical Provider, MD   BP 138/65  Pulse 90  Temp(Src) 98.2 F (36.8 C) (Oral)  Resp 20  Ht 6' (1.829 m)  Wt 234 lb (106.142 kg)  BMI 31.73 kg/m2  SpO2 93% Physical Exam  Nursing note and vitals reviewed. Constitutional: He appears well-developed and well-nourished. No distress.  Sitting in bed. Appears tired, but not toxic.   HENT:  Head: Normocephalic and atraumatic.  Eyes: Conjunctivae are normal. Pupils are equal, round, and reactive to light. Right eye exhibits no discharge. Left eye exhibits no discharge.  Neck: Neck supple.  Cardiovascular: Normal rate, regular rhythm and normal heart sounds.  Exam reveals no gallop and no friction rub.   No murmur heard. Pulmonary/Chest: Effort normal. No respiratory distress. He has wheezes.  Expiratory wheezing b/l. Mild tachypnea. No accessory muscle usage. Speaking in complete sentences.   Abdominal: Soft. He exhibits no distension. There is no tenderness.  Musculoskeletal: He exhibits edema. He exhibits no tenderness.  Minimal symmetric LE edema. No calf tenderness. Negative Homan's. No palpable cords.   Neurological: He is alert.  Skin: Skin is warm and dry.  He is not diaphoretic.  Psychiatric: He has a normal mood and affect. His behavior is normal. Thought content normal.    ED Course  Procedures (including critical care time) Labs Review Labs Reviewed  CBC - Abnormal; Notable for the following:    RBC 3.79 (*)    Hemoglobin 11.3 (*)    HCT 33.8 (*)    RDW 16.3 (*)    All other components within normal limits  BASIC METABOLIC PANEL - Abnormal; Notable for the following:    Glucose, Bld 184 (*)    GFR calc non Af Amer 65 (*)    GFR calc Af Amer 76 (*)    All other components within normal limits  TROPONIN I    Imaging Review Dg Chest 2 View  06/28/2014   CLINICAL DATA:  Shortness of breath and wheezing  EXAM: CHEST  2 VIEW  COMPARISON:  04/11/2014  FINDINGS: Cardiac shadow is mildly enlarged but stable. Mild bibasilar atelectatic changes are seen without focal confluent infiltrate. No sizable effusion is seen. No bony abnormality is noted.  IMPRESSION: Mild basilar atelectasis.   Electronically Signed   By: Inez Catalina M.D.   On: 06/28/2014 20:50     EKG Interpretation None      MDM   Final diagnoses:  Bronchitis    62yM with dyspnea/wheezing. Mild hypoxia. No significant distress on exam though. Improved with nebs. Suspect bronchitis. Pt reporting same symptoms with previous pneumonia and very concerned getting it again. CXR is clear. Afebrile. No leukocytosis. Will DC with script for abx but recommended not filling unless symptoms don't begin to improve after a couple days other steroids/symptomatic tx.     Virgel Manifold, MD 06/29/14 1124

## 2014-06-28 NOTE — ED Notes (Signed)
Dr. Kohut at bedside 

## 2014-07-14 ENCOUNTER — Emergency Department (HOSPITAL_COMMUNITY): Payer: BC Managed Care – PPO

## 2014-07-14 ENCOUNTER — Emergency Department (HOSPITAL_COMMUNITY)
Admission: EM | Admit: 2014-07-14 | Discharge: 2014-07-15 | Disposition: A | Discharge status: Home / Self Care | Payer: BC Managed Care – PPO | Attending: Emergency Medicine | Admitting: Emergency Medicine

## 2014-07-14 ENCOUNTER — Encounter (HOSPITAL_COMMUNITY): Payer: Self-pay | Admitting: *Deleted

## 2014-07-14 DIAGNOSIS — Z87891 Personal history of nicotine dependence: Secondary | ICD-10-CM | POA: Insufficient documentation

## 2014-07-14 DIAGNOSIS — R110 Nausea: Secondary | ICD-10-CM | POA: Insufficient documentation

## 2014-07-14 DIAGNOSIS — E119 Type 2 diabetes mellitus without complications: Secondary | ICD-10-CM | POA: Insufficient documentation

## 2014-07-14 DIAGNOSIS — I10 Essential (primary) hypertension: Secondary | ICD-10-CM | POA: Diagnosis not present

## 2014-07-14 DIAGNOSIS — R059 Cough, unspecified: Secondary | ICD-10-CM

## 2014-07-14 DIAGNOSIS — Z79899 Other long term (current) drug therapy: Secondary | ICD-10-CM | POA: Diagnosis not present

## 2014-07-14 DIAGNOSIS — E780 Pure hypercholesterolemia: Secondary | ICD-10-CM | POA: Insufficient documentation

## 2014-07-14 DIAGNOSIS — J208 Acute bronchitis due to other specified organisms: Secondary | ICD-10-CM

## 2014-07-14 DIAGNOSIS — R0602 Shortness of breath: Secondary | ICD-10-CM | POA: Diagnosis present

## 2014-07-14 DIAGNOSIS — Z792 Long term (current) use of antibiotics: Secondary | ICD-10-CM | POA: Diagnosis not present

## 2014-07-14 DIAGNOSIS — R61 Generalized hyperhidrosis: Secondary | ICD-10-CM | POA: Insufficient documentation

## 2014-07-14 DIAGNOSIS — Z7952 Long term (current) use of systemic steroids: Secondary | ICD-10-CM | POA: Diagnosis not present

## 2014-07-14 DIAGNOSIS — Z8701 Personal history of pneumonia (recurrent): Secondary | ICD-10-CM | POA: Insufficient documentation

## 2014-07-14 DIAGNOSIS — R05 Cough: Secondary | ICD-10-CM

## 2014-07-14 DIAGNOSIS — J209 Acute bronchitis, unspecified: Secondary | ICD-10-CM | POA: Insufficient documentation

## 2014-07-14 DIAGNOSIS — Z7982 Long term (current) use of aspirin: Secondary | ICD-10-CM | POA: Diagnosis not present

## 2014-07-14 DIAGNOSIS — K219 Gastro-esophageal reflux disease without esophagitis: Secondary | ICD-10-CM | POA: Insufficient documentation

## 2014-07-14 HISTORY — DX: Pleurisy: R09.1

## 2014-07-14 LAB — CBC WITH DIFFERENTIAL/PLATELET
BASOS ABS: 0 10*3/uL (ref 0.0–0.1)
BASOS PCT: 0 % (ref 0–1)
Eosinophils Absolute: 0.3 10*3/uL (ref 0.0–0.7)
Eosinophils Relative: 3 % (ref 0–5)
HCT: 38.4 % — ABNORMAL LOW (ref 39.0–52.0)
Hemoglobin: 13.3 g/dL (ref 13.0–17.0)
LYMPHS PCT: 16 % (ref 12–46)
Lymphs Abs: 1.9 10*3/uL (ref 0.7–4.0)
MCH: 30.2 pg (ref 26.0–34.0)
MCHC: 34.6 g/dL (ref 30.0–36.0)
MCV: 87.1 fL (ref 78.0–100.0)
MONOS PCT: 10 % (ref 3–12)
Monocytes Absolute: 1.2 10*3/uL — ABNORMAL HIGH (ref 0.1–1.0)
NEUTROS PCT: 71 % (ref 43–77)
Neutro Abs: 8.7 10*3/uL — ABNORMAL HIGH (ref 1.7–7.7)
Platelets: 287 10*3/uL (ref 150–400)
RBC: 4.41 MIL/uL (ref 4.22–5.81)
RDW: 15.9 % — ABNORMAL HIGH (ref 11.5–15.5)
WBC: 12.2 10*3/uL — ABNORMAL HIGH (ref 4.0–10.5)

## 2014-07-14 MED ORDER — IPRATROPIUM-ALBUTEROL 0.5-2.5 (3) MG/3ML IN SOLN
3.0000 mL | Freq: Once | RESPIRATORY_TRACT | Status: AC
  Administered 2014-07-14: 3 mL via RESPIRATORY_TRACT
  Filled 2014-07-14: qty 3

## 2014-07-14 NOTE — ED Provider Notes (Signed)
CSN: [id number]     Arrival date & time 07/14/14  2246 History  This chart was scribed for Delora Fuel, MD by Rayfield Citizen, ED Scribe. This patient was seen in room APA03/APA03 and the patient's care was started at 11:22 PM.    Chief Complaint  Patient presents with  . Shortness of Breath   The history is provided by the patient. No language interpreter was used.     HPI Comments: Douglas Mason is a 63 y.o. male with past medical history of HTN, HLD, DM, pneumonia, pleurisy, who presents to the Emergency Department complaining of several days of dry cough and 1 day of worsening associated SOB. He also notes left lateral chest pain, only with cough. He reports sweats and nausea. His SOB worsens with exertion. He took some cough syrup with codeine with no improvement in his symptoms. He denies fever, chills, vomiting, sick contacts.   Patient was recently seen here (06/28/14) for similar, cold-like symptoms. According to his wife, his symptoms have been waxing and waning over the past several months; she reports that he was recently diagnosed at [facility] with a "lung disease, work-related, nothing major." This diagnosis was not necessarily agreed upon by a lung specialist - wife is confused about the details here.   Patient is a former smoker (quit date 09/1976). He has an inhaler at home that he utilizes without relief.   Past Medical History  Diagnosis Date  . Hypertension   . Diabetes mellitus without complication   . GERD (gastroesophageal reflux disease)   . Pneumonia   . High cholesterol   . Pleurisy    History reviewed. No pertinent past surgical history. Family History  Problem Relation Age of Onset  . Asthma Mother   . Leukemia Mother    History  Substance Use Topics  . Smoking status: Former Smoker -- 2.00 packs/day for 30 years    Types: Cigarettes    Quit date: 09/02/1976  . Smokeless tobacco: Former Systems developer    Types: Chew    Quit date: 09/03/2003  . Alcohol Use: No     Review of Systems  Constitutional: Positive for diaphoresis. Negative for fever and chills.  Respiratory: Positive for cough and shortness of breath.   Gastrointestinal: Positive for nausea. Negative for vomiting.  All other systems reviewed and are negative.   Allergies  Review of patient's allergies indicates no known allergies.  Home Medications   Prior to Admission medications   Medication Sig Start Date End Date Taking? Authorizing Provider  albuterol (PROVENTIL HFA;VENTOLIN HFA) 108 (90 BASE) MCG/ACT inhaler Inhale 2 puffs into the lungs every 6 (six) hours as needed for wheezing or shortness of breath.    Historical Provider, MD  albuterol (PROVENTIL HFA;VENTOLIN HFA) 108 (90 BASE) MCG/ACT inhaler Inhale 2 puffs into the lungs every 4 (four) hours as needed for wheezing or shortness of breath. 06/28/14   Virgel Manifold, MD  ALPRAZolam Duanne Moron) 0.5 MG tablet Take 1 tablet by mouth at bedtime as needed. anxiety 01/12/14   Historical Provider, MD  amLODipine (NORVASC) 5 MG tablet Take 5 mg by mouth daily. 06/02/14   Historical Provider, MD  aspirin EC 81 MG tablet Take 81 mg by mouth daily.    Historical Provider, MD  azithromycin (ZITHROMAX) 250 MG tablet Take 1 tablet (250 mg total) by mouth daily. Take first 2 tablets together, then 1 every day until finished. 06/28/14   Virgel Manifold, MD  HYDROcodone-acetaminophen (NORCO) 5-325 MG per tablet Take 1  tablet by mouth every 6 (six) hours as needed (for pain). 04/12/14   John L Molpus, MD  HYDROcodone-homatropine (HYCODAN) 5-1.5 MG/5ML syrup Take 5 mLs by mouth every 6 (six) hours as needed for cough.    Historical Provider, MD  JANUMET 50-1000 MG per tablet Take 1 tablet by mouth 2 (two) times daily. 01/12/14   Historical Provider, MD  Multiple Vitamins-Minerals (MULTIVITAMINS THER. W/MINERALS) TABS tablet Take 1 tablet by mouth daily.    Historical Provider, MD  pantoprazole (PROTONIX) 40 MG tablet Take 1 tablet (40 mg total) by mouth 2  (two) times daily before a meal. 03/17/14   Kelby Aline, MD  pravastatin (PRAVACHOL) 40 MG tablet Take 1 tablet by mouth daily. 01/01/14   Historical Provider, MD  predniSONE (DELTASONE) 20 MG tablet Take 2 tablets (40 mg total) by mouth daily. 06/28/14   Virgel Manifold, MD   BP 129/87 mmHg  Pulse 119  Temp(Src) 98.5 F (36.9 C) (Oral)  Resp 17  Ht 6' (1.829 m)  Wt 234 lb (106.142 kg)  BMI 31.73 kg/m2  SpO2 96% Physical Exam  Constitutional: He is oriented to person, place, and time. He appears well-developed and well-nourished.  HENT:  Head: Normocephalic and atraumatic.  Neck: No tracheal deviation present.  Cardiovascular:  Tachycardic  Pulmonary/Chest: Effort normal. He has no wheezes. He has no rales.  Prolonged exhalation phase  Neurological: He is alert and oriented to person, place, and time.  Skin: Skin is warm and dry.  Psychiatric: He has a normal mood and affect. His behavior is normal.  Nursing note and vitals reviewed.   ED Course  Procedures   DIAGNOSTIC STUDIES: Oxygen Saturation is 96% on RA, adequate by my interpretation.    COORDINATION OF CARE: 11:34 PM Discussed treatment plan with pt at bedside and pt agreed to plan.   Labs Review Results for orders placed or performed during the hospital encounter of 07/14/14  CBC with Differential  Result Value Ref Range   WBC 12.2 (H) 4.0 - 10.5 K/uL   RBC 4.41 4.22 - 5.81 MIL/uL   Hemoglobin 13.3 13.0 - 17.0 g/dL   HCT 38.4 (L) 39.0 - 52.0 %   MCV 87.1 78.0 - 100.0 fL   MCH 30.2 26.0 - 34.0 pg   MCHC 34.6 30.0 - 36.0 g/dL   RDW 15.9 (H) 11.5 - 15.5 %   Platelets 287 150 - 400 K/uL   Neutrophils Relative % 71 43 - 77 %   Neutro Abs 8.7 (H) 1.7 - 7.7 K/uL   Lymphocytes Relative 16 12 - 46 %   Lymphs Abs 1.9 0.7 - 4.0 K/uL   Monocytes Relative 10 3 - 12 %   Monocytes Absolute 1.2 (H) 0.1 - 1.0 K/uL   Eosinophils Relative 3 0 - 5 %   Eosinophils Absolute 0.3 0.0 - 0.7 K/uL   Basophils Relative 0 0 - 1 %    Basophils Absolute 0.0 0.0 - 0.1 K/uL  Basic metabolic panel  Result Value Ref Range   Sodium 134 (L) 137 - 147 mEq/L   Potassium 4.1 3.7 - 5.3 mEq/L   Chloride 95 (L) 96 - 112 mEq/L   CO2 23 19 - 32 mEq/L   Glucose, Bld 158 (H) 70 - 99 mg/dL   BUN 16 6 - 23 mg/dL   Creatinine, Ser 0.87 0.50 - 1.35 mg/dL   Calcium 9.4 8.4 - 10.5 mg/dL   GFR calc non Af Amer 89 (L) >90 mL/min  GFR calc Af Amer >90 >90 mL/min   Anion gap 16 (H) 5 - 15  Troponin I  Result Value Ref Range   Troponin I <0.30 <0.30 ng/mL  Pro b natriuretic peptide (BNP)  Result Value Ref Range   Pro B Natriuretic peptide (BNP) 23.2 0 - 125 pg/mL   Imaging Review Dg Chest 2 View  07/15/2014   CLINICAL DATA:  Shortness of breath and dry cough beginning today. Severe shortness of breath beginning this evening.  EXAM: CHEST  2 VIEW  COMPARISON:  Chest radiograph June 28, 2014  FINDINGS: The cardiac silhouette remains mildly enlarged, tortuous mildly calcified aorta. New small to moderate layering LEFT pleural effusion. Trachea projects midline and there is no pneumothorax.  Mild degenerative change of thoracic spine. Soft tissue planes are nonsuspicious.  IMPRESSION: Stable cardiomegaly. New small to moderate layering LEFT pleural effusion with bibasilar atelectasis. Recommend followup chest radiograph after treatment to verify improvement.   Electronically Signed   By: Elon Alas   On: 07/15/2014 00:31   Images viewed by me.   EKG Interpretation   Date/Time:  Thursday July 14 2014 23:38:02 EST Ventricular Rate:  99 PR Interval:  172 QRS Duration: 93 QT Interval:  337 QTC Calculation: 432 R Axis:   18 Text Interpretation:  Sinus rhythm Borderline T wave abnormalities When  compared with ECG of 06/11/2014, No significant change was found Confirmed  by [facility]  MD, Jannessa Ogden ([id number]) on 07/14/2014 11:57:07 PM      MDM   Final diagnoses:  Cough  Acute bronchitis due to other specified organisms    Respiratory tract infection which is either bronchitis or pneumonia. Chest x-ray will be obtained and he will be given albuterol with ipratropium nebulizer treatment as well as initial dose of prednisone. It is noted that he is diabetic and on oral hypoglycemic medications, so will try to minimize prednisone dose. Old records are reviewed and it is noted that he had a hospitalization for community-acquired pneumonia in July and he did have pulmonary function studies at that time which showed restrictive lung disease.  Following the initial nebulizer treatment, there is slight improvement and on auscultation there was little change. He is given a second nebulizer treatment with significant clinical improvement and on auscultation lungs were clear. Chest x-ray is read as no pneumonia by radiologist, but I suspect that there is some airspace disease in the left base and he will be treated for community-acquired pneumonia. He is given a dose of amoxicillin. He is discharged with prescriptions for amoxicillin and prednisone and is to follow-up with his PCP in 5 days. Patient and his wife expressed great concern about the nature of his lung disease and it was suggested that he should talk with his PCP about referral to a lung specialist for more detailed workup.  I personally performed the services described in this documentation, which was scribed in my presence. The recorded information has been reviewed and is accurate.       Delora Fuel, MD 60/45/40 [id number]

## 2014-07-14 NOTE — ED Notes (Signed)
Sob ,onset today,   Seen here 10/27 for similar sx ,  Cough,

## 2014-07-15 LAB — BASIC METABOLIC PANEL
ANION GAP: 16 — AB (ref 5–15)
BUN: 16 mg/dL (ref 6–23)
CO2: 23 meq/L (ref 19–32)
Calcium: 9.4 mg/dL (ref 8.4–10.5)
Chloride: 95 mEq/L — ABNORMAL LOW (ref 96–112)
Creatinine, Ser: 0.87 mg/dL (ref 0.50–1.35)
GFR calc Af Amer: >90 mL/min (ref >90)
GFR calc non Af Amer: 89 mL/min — ABNORMAL LOW (ref >90)
Glucose, Bld: 158 mg/dL — ABNORMAL HIGH (ref 70–99)
POTASSIUM: 4.1 meq/L (ref 3.7–5.3)
Sodium: 134 mEq/L — ABNORMAL LOW (ref 137–147)

## 2014-07-15 LAB — TROPONIN I: Troponin I: <0.3 ng/mL (ref <0.30)

## 2014-07-15 LAB — PRO B NATRIURETIC PEPTIDE: PRO B NATRI PEPTIDE: 23.2 pg/mL (ref 0–125)

## 2014-07-15 MED ORDER — AMOXICILLIN 250 MG PO CAPS
1000.0000 mg | ORAL_CAPSULE | Freq: Once | ORAL | Status: AC
  Administered 2014-07-15: 1000 mg via ORAL
  Filled 2014-07-15: qty 4

## 2014-07-15 MED ORDER — AMOXICILLIN 500 MG PO CAPS: 1000.0000 mg | ORAL_CAPSULE | Freq: Two times a day (BID) | ORAL | Status: DC

## 2014-07-15 MED ORDER — IPRATROPIUM-ALBUTEROL 0.5-2.5 (3) MG/3ML IN SOLN
3.0000 mL | Freq: Once | RESPIRATORY_TRACT | Status: AC
  Administered 2014-07-15: 3 mL via RESPIRATORY_TRACT
  Filled 2014-07-15: qty 3

## 2014-07-15 MED ORDER — PREDNISONE 20 MG PO TABS: 40.0000 mg | ORAL_TABLET | Freq: Every day | ORAL | Status: DC

## 2014-07-15 MED ORDER — PREDNISONE 20 MG PO TABS
40.0000 mg | ORAL_TABLET | Freq: Once | ORAL | Status: AC
  Administered 2014-07-15: 40 mg via ORAL
  Filled 2014-07-15: qty 2

## 2014-07-15 NOTE — ED Notes (Signed)
RT paged. Pt. Given coffee

## 2014-07-15 NOTE — Discharge Instructions (Signed)
Use your inhaler every 4 hours as needed for cough or difficulty breathing.  Acute Bronchitis Bronchitis is inflammation of the airways that extend from the windpipe into the lungs (bronchi). The inflammation often causes mucus to develop. This leads to a cough, which is the most common symptom of bronchitis.  In acute bronchitis, the condition usually develops suddenly and goes away over time, usually in a couple weeks. Smoking, allergies, and asthma can make bronchitis worse. Repeated episodes of bronchitis may cause further lung problems.  CAUSES Acute bronchitis is most often caused by the same virus that causes a cold. The virus can spread from person to person (contagious) through coughing, sneezing, and touching contaminated objects. SIGNS AND SYMPTOMS   Cough.   Fever.   Coughing up mucus.   Body aches.   Chest congestion.   Chills.   Shortness of breath.   Sore throat.  DIAGNOSIS  Acute bronchitis is usually diagnosed through a physical exam. Your health care provider will also ask you questions about your medical history. Tests, such as chest X-rays, are sometimes done to rule out other conditions.  TREATMENT  Acute bronchitis usually goes away in a couple weeks. Oftentimes, no medical treatment is necessary. Medicines are sometimes given for relief of fever or cough. Antibiotic medicines are usually not needed but may be prescribed in certain situations. In some cases, an inhaler may be recommended to help reduce shortness of breath and control the cough. A cool mist vaporizer may also be used to help thin bronchial secretions and make it easier to clear the chest.  HOME CARE INSTRUCTIONS  Get plenty of rest.   Drink enough fluids to keep your urine clear or pale yellow (unless you have a medical condition that requires fluid restriction). Increasing fluids may help thin your respiratory secretions (sputum) and reduce chest congestion, and it will prevent  dehydration.   Take medicines only as directed by your health care provider.  If you were prescribed an antibiotic medicine, finish it all even if you start to feel better.  Avoid smoking and secondhand smoke. Exposure to cigarette smoke or irritating chemicals will make bronchitis worse. If you are a smoker, consider using nicotine gum or skin patches to help control withdrawal symptoms. Quitting smoking will help your lungs heal faster.   Reduce the chances of another bout of acute bronchitis by washing your hands frequently, avoiding people with cold symptoms, and trying not to touch your hands to your mouth, nose, or eyes.   Keep all follow-up visits as directed by your health care provider.  SEEK MEDICAL CARE IF: Your symptoms do not improve after 1 week of treatment.  SEEK IMMEDIATE MEDICAL CARE IF:  You develop an increased fever or chills.   You have chest pain.   You have severe shortness of breath.  You have bloody sputum.   You develop dehydration.  You faint or repeatedly feel like you are going to pass out.  You develop repeated vomiting.  You develop a severe headache. MAKE SURE YOU:   Understand these instructions.  Will watch your condition.  Will get help right away if you are not doing well or get worse. Document Released: 09/26/2004 Document Revised: 01/03/2014 Document Reviewed: 02/09/2013 [facility] Patient Information 2015 [place], [place]. This information is not intended to replace advice given to you by your health care provider. Make sure you discuss any questions you have with your health care provider.  Amoxicillin capsules or tablets What is this medicine? AMOXICILLIN (  a mox i SIL in) is a penicillin antibiotic. It is used to treat certain kinds of bacterial infections. It will not work for colds, flu, or other viral infections. This medicine may be used for other purposes; ask your health care provider or pharmacist if you have  questions. COMMON BRAND NAME(S): Amoxil, Moxilin, Sumox, Trimox What should I tell my health care provider before I take this medicine? They need to know if you have any of these conditions: -asthma -kidney disease -an unusual or allergic reaction to amoxicillin, other penicillins, cephalosporin antibiotics, other medicines, foods, dyes, or preservatives -pregnant or trying to get pregnant -breast-feeding How should I use this medicine? Take this medicine by mouth with a glass of water. Follow the directions on your prescription label. You may take this medicine with food or on an empty stomach. Take your medicine at regular intervals. Do not take your medicine more often than directed. Take all of your medicine as directed even if you think your are better. Do not skip doses or stop your medicine early. Talk to your pediatrician regarding the use of this medicine in children. While this drug may be prescribed for selected conditions, precautions do apply. Overdosage: If you think you have taken too much of this medicine contact a poison control center or emergency room at once. NOTE: This medicine is only for you. Do not share this medicine with others. What if I miss a dose? If you miss a dose, take it as soon as you can. If it is almost time for your next dose, take only that dose. Do not take double or extra doses. What may interact with this medicine? -amiloride -birth control pills -chloramphenicol -macrolides -probenecid -sulfonamides -tetracyclines This list may not describe all possible interactions. Give your health care provider a list of all the medicines, herbs, non-prescription drugs, or dietary supplements you use. Also tell them if you smoke, drink alcohol, or use illegal drugs. Some items may interact with your medicine. What should I watch for while using this medicine? Tell your doctor or health care professional if your symptoms do not improve in 2 or 3 days. Take all of  the doses of your medicine as directed. Do not skip doses or stop your medicine early. If you are diabetic, you may get a false positive result for sugar in your urine with certain brands of urine tests. Check with your doctor. Do not treat diarrhea with over-the-counter products. Contact your doctor if you have diarrhea that lasts more than 2 days or if the diarrhea is severe and watery. What side effects may I notice from receiving this medicine? Side effects that you should report to your doctor or health care professional as soon as possible: -allergic reactions like skin rash, itching or hives, swelling of the face, lips, or tongue -breathing problems -dark urine -redness, blistering, peeling or loosening of the skin, including inside the mouth -seizures -severe or watery diarrhea -trouble passing urine or change in the amount of urine -unusual bleeding or bruising -unusually weak or tired -yellowing of the eyes or skin Side effects that usually do not require medical attention (report to your doctor or health care professional if they continue or are bothersome): -dizziness -headache -stomach upset -trouble sleeping This list may not describe all possible side effects. Call your doctor for medical advice about side effects. You may report side effects to FDA at 1-800-FDA-1088. Where should I keep my medicine? Keep out of the reach of children. Store between 61  and 77 degrees F (20 and 25 degrees C). Keep bottle closed tightly. Throw away any unused medicine after the expiration date. NOTE: This sheet is a summary. It may not cover all possible information. If you have questions about this medicine, talk to your doctor, pharmacist, or health care provider.  2015, Elsevier/Gold Standard. (2007-11-10 14:10:59)  Prednisone tablets What is this medicine? PREDNISONE (PRED ni sone) is a corticosteroid. It is commonly used to treat inflammation of the skin, joints, lungs, and other  organs. Common conditions treated include asthma, allergies, and arthritis. It is also used for other conditions, such as blood disorders and diseases of the adrenal glands. This medicine may be used for other purposes; ask your health care provider or pharmacist if you have questions. COMMON BRAND NAME(S): Deltasone, Predone, Sterapred, Sterapred DS What should I tell my health care provider before I take this medicine? They need to know if you have any of these conditions: -Cushing's syndrome -diabetes -glaucoma -heart disease -high blood pressure -infection (especially a virus infection such as chickenpox, cold sores, or herpes) -kidney disease -liver disease -mental illness -myasthenia gravis -osteoporosis -seizures -stomach or intestine problems -thyroid disease -an unusual or allergic reaction to lactose, prednisone, other medicines, foods, dyes, or preservatives -pregnant or trying to get pregnant -breast-feeding How should I use this medicine? Take this medicine by mouth with a glass of water. Follow the directions on the prescription label. Take this medicine with food. If you are taking this medicine once a day, take it in the morning. Do not take more medicine than you are told to take. Do not suddenly stop taking your medicine because you may develop a severe reaction. Your doctor will tell you how much medicine to take. If your doctor wants you to stop the medicine, the dose may be slowly lowered over time to avoid any side effects. Talk to your pediatrician regarding the use of this medicine in children. Special care may be needed. Overdosage: If you think you have taken too much of this medicine contact a poison control center or emergency room at once. NOTE: This medicine is only for you. Do not share this medicine with others. What if I miss a dose? If you miss a dose, take it as soon as you can. If it is almost time for your next dose, talk to your doctor or health care  professional. You may need to miss a dose or take an extra dose. Do not take double or extra doses without advice. What may interact with this medicine? Do not take this medicine with any of the following medications: -metyrapone -mifepristone This medicine may also interact with the following medications: -aminoglutethimide -amphotericin B -aspirin and aspirin-like medicines -barbiturates -certain medicines for diabetes, like glipizide or glyburide -cholestyramine -cholinesterase inhibitors -cyclosporine -digoxin -diuretics -ephedrine -male hormones, like estrogens and birth control pills -isoniazid -ketoconazole -NSAIDS, medicines for pain and inflammation, like ibuprofen or naproxen -phenytoin -rifampin -toxoids -vaccines -warfarin This list may not describe all possible interactions. Give your health care provider a list of all the medicines, herbs, non-prescription drugs, or dietary supplements you use. Also tell them if you smoke, drink alcohol, or use illegal drugs. Some items may interact with your medicine. What should I watch for while using this medicine? Visit your doctor or health care professional for regular checks on your progress. If you are taking this medicine over a prolonged period, carry an identification card with your name and address, the type and dose of your medicine,  and your doctor's name and address. This medicine may increase your risk of getting an infection. Tell your doctor or health care professional if you are around anyone with measles or chickenpox, or if you develop sores or blisters that do not heal properly. If you are going to have surgery, tell your doctor or health care professional that you have taken this medicine within the last twelve months. Ask your doctor or health care professional about your diet. You may need to lower the amount of salt you eat. This medicine may affect blood sugar levels. If you have diabetes, check with your  doctor or health care professional before you change your diet or the dose of your diabetic medicine. What side effects may I notice from receiving this medicine? Side effects that you should report to your doctor or health care professional as soon as possible: -allergic reactions like skin rash, itching or hives, swelling of the face, lips, or tongue -changes in emotions or moods -changes in vision -depressed mood -eye pain -fever or chills, cough, sore throat, pain or difficulty passing urine -increased thirst -swelling of ankles, feet Side effects that usually do not require medical attention (report to your doctor or health care professional if they continue or are bothersome): -confusion, excitement, restlessness -headache -nausea, vomiting -skin problems, acne, thin and shiny skin -trouble sleeping -weight gain This list may not describe all possible side effects. Call your doctor for medical advice about side effects. You may report side effects to FDA at 1-800-FDA-1088. Where should I keep my medicine? Keep out of the reach of children. Store at room temperature between 15 and 30 degrees C (59 and 86 degrees F). Protect from light. Keep container tightly closed. Throw away any unused medicine after the expiration date. NOTE: This sheet is a summary. It may not cover all possible information. If you have questions about this medicine, talk to your doctor, pharmacist, or health care provider.  2015, Elsevier/Gold Standard. (2011-04-04 10:57:14)

## 2014-07-15 NOTE — ED Notes (Signed)
Dr. Glick at bedside.  

## 2014-07-22 ENCOUNTER — Emergency Department (HOSPITAL_COMMUNITY): Payer: BC Managed Care – PPO

## 2014-07-22 ENCOUNTER — Emergency Department (HOSPITAL_COMMUNITY)
Admission: EM | Admit: 2014-07-22 | Discharge: 2014-07-22 | Disposition: A | Discharge status: Home / Self Care | Payer: BC Managed Care – PPO | Attending: Emergency Medicine | Admitting: Emergency Medicine

## 2014-07-22 ENCOUNTER — Encounter (HOSPITAL_COMMUNITY): Payer: Self-pay | Admitting: Emergency Medicine

## 2014-07-22 DIAGNOSIS — Z7982 Long term (current) use of aspirin: Secondary | ICD-10-CM | POA: Diagnosis not present

## 2014-07-22 DIAGNOSIS — Z8701 Personal history of pneumonia (recurrent): Secondary | ICD-10-CM | POA: Insufficient documentation

## 2014-07-22 DIAGNOSIS — Z7952 Long term (current) use of systemic steroids: Secondary | ICD-10-CM | POA: Diagnosis not present

## 2014-07-22 DIAGNOSIS — J40 Bronchitis, not specified as acute or chronic: Secondary | ICD-10-CM

## 2014-07-22 DIAGNOSIS — Z87891 Personal history of nicotine dependence: Secondary | ICD-10-CM | POA: Diagnosis not present

## 2014-07-22 DIAGNOSIS — J209 Acute bronchitis, unspecified: Secondary | ICD-10-CM | POA: Insufficient documentation

## 2014-07-22 DIAGNOSIS — Z792 Long term (current) use of antibiotics: Secondary | ICD-10-CM | POA: Diagnosis not present

## 2014-07-22 DIAGNOSIS — I10 Essential (primary) hypertension: Secondary | ICD-10-CM | POA: Insufficient documentation

## 2014-07-22 DIAGNOSIS — R079 Chest pain, unspecified: Secondary | ICD-10-CM | POA: Diagnosis present

## 2014-07-22 DIAGNOSIS — E119 Type 2 diabetes mellitus without complications: Secondary | ICD-10-CM | POA: Insufficient documentation

## 2014-07-22 DIAGNOSIS — K219 Gastro-esophageal reflux disease without esophagitis: Secondary | ICD-10-CM | POA: Insufficient documentation

## 2014-07-22 DIAGNOSIS — Z79899 Other long term (current) drug therapy: Secondary | ICD-10-CM | POA: Diagnosis not present

## 2014-07-22 DIAGNOSIS — E780 Pure hypercholesterolemia: Secondary | ICD-10-CM | POA: Diagnosis not present

## 2014-07-22 HISTORY — DX: Bronchitis, not specified as acute or chronic: J40

## 2014-07-22 LAB — BASIC METABOLIC PANEL
ANION GAP: 14 (ref 5–15)
BUN: 15 mg/dL (ref 6–23)
CHLORIDE: 98 meq/L (ref 96–112)
CO2: 26 meq/L (ref 19–32)
Calcium: 9.3 mg/dL (ref 8.4–10.5)
Creatinine, Ser: 0.87 mg/dL (ref 0.50–1.35)
GFR calc non Af Amer: 89 mL/min — ABNORMAL LOW (ref >90)
Glucose, Bld: 165 mg/dL — ABNORMAL HIGH (ref 70–99)
POTASSIUM: 4.3 meq/L (ref 3.7–5.3)
Sodium: 138 mEq/L (ref 137–147)

## 2014-07-22 LAB — CBC
HCT: 40.1 % (ref 39.0–52.0)
Hemoglobin: 13.5 g/dL (ref 13.0–17.0)
MCH: 30.1 pg (ref 26.0–34.0)
MCHC: 33.7 g/dL (ref 30.0–36.0)
MCV: 89.3 fL (ref 78.0–100.0)
PLATELETS: 355 10*3/uL (ref 150–400)
RBC: 4.49 MIL/uL (ref 4.22–5.81)
RDW: 15.8 % — ABNORMAL HIGH (ref 11.5–15.5)
WBC: 10.8 10*3/uL — AB (ref 4.0–10.5)

## 2014-07-22 LAB — I-STAT TROPONIN, ED: TROPONIN I, POC: 0 ng/mL (ref 0.00–0.08)

## 2014-07-22 LAB — PRO B NATRIURETIC PEPTIDE: PRO B NATRI PEPTIDE: 13.4 pg/mL (ref 0–125)

## 2014-07-22 MED ORDER — IPRATROPIUM-ALBUTEROL 0.5-2.5 (3) MG/3ML IN SOLN
3.0000 mL | Freq: Once | RESPIRATORY_TRACT | Status: AC
  Administered 2014-07-22: 3 mL via RESPIRATORY_TRACT
  Filled 2014-07-22: qty 3

## 2014-07-22 MED ORDER — LEVOFLOXACIN 500 MG PO TABS
500.0000 mg | ORAL_TABLET | Freq: Every day | ORAL | Status: DC
  Filled 2014-07-22: qty 1

## 2014-07-22 MED ORDER — HYDROCOD POLST-CHLORPHEN POLST 10-8 MG/5ML PO LQCR: 5.0000 mL | Freq: Two times a day (BID) | ORAL | Status: DC | PRN

## 2014-07-22 MED ORDER — SODIUM CHLORIDE 0.9 % IV BOLUS (SEPSIS)
500.0000 mL | Freq: Once | INTRAVENOUS | Status: AC
  Administered 2014-07-22: 500 mL via INTRAVENOUS

## 2014-07-22 MED ORDER — HYDROCOD POLST-CHLORPHEN POLST 10-8 MG/5ML PO LQCR
5.0000 mL | Freq: Once | ORAL | Status: AC
  Administered 2014-07-22: 5 mL via ORAL
  Filled 2014-07-22: qty 5

## 2014-07-22 MED ORDER — LEVOFLOXACIN 500 MG PO TABS: 500.0000 mg | ORAL_TABLET | Freq: Every day | ORAL | Status: DC

## 2014-07-22 NOTE — ED Notes (Addendum)
Pt. reports left lateral ribcage pain with SOB and dry cough onset several days ago . Pain worse when coughing and deep inspiration .

## 2014-07-22 NOTE — ED Notes (Signed)
Patient transported to X-ray 

## 2014-07-22 NOTE — ED Notes (Signed)
MD at bedside. 

## 2014-07-22 NOTE — ED Provider Notes (Signed)
63 year old male, history of approximately 7-10 days of coughing which he describes as a dry cough, he now has a left-sided chest pain which is worse with coughing. He has a mild expiratory wheeze but is able to speak in full sentences, has no peripheral edema or JVD and an EKG which shows a sinus tach without any signs of ischemia. Chest x-ray with a small left pleural effusion, I discussed the findings with the patient and family members, he appears stable for discharge with increased aggressive treatment for his bronchitis and follow-up with his doctor, Dr. [name] in [place]. The patient and family are in agreement.  Medical screening examination/treatment/procedure(s) were conducted as a shared visit with non-physician practitioner(s) and myself.  I personally evaluated the patient during the encounter.  Clinical Impression:   Final diagnoses:  Bronchitis         Johnna Acosta, MD 07/22/14 2111

## 2014-07-22 NOTE — Discharge Instructions (Signed)
He can stop taking the amoxicillin and start taking Levaquin.  Do not drive when you're taking Tussionex, it will make you drowsy and slow your reaction time.  Administer nebs every 4 hours at home for the next several days.  Please follow with your primary care doctor in the next 2 days for a check-up. They must obtain records for further management.   Do not hesitate to return to the Emergency Department for any new, worsening or concerning symptoms.

## 2014-07-22 NOTE — ED Notes (Signed)
NP at BS.

## 2014-07-22 NOTE — ED Provider Notes (Signed)
CSN: [id number]     Arrival date & time 07/22/14  0535 History   None    Chief Complaint  Patient presents with  . Chest Pain  . Shortness of Breath     (Consider location/radiation/quality/duration/timing/severity/associated sxs/prior Treatment) Patient is a 63 y.o. male presenting with chest pain and shortness of breath.  Chest Pain Associated symptoms: shortness of breath   Shortness of Breath Associated symptoms: chest pain      DEZMON CONOVER is a 63 y.o. male with past medical history significant for non-insulin-dependent diabetes, hypertension, former smoker, GERD complaining of pleuritic left-sided chest pain rated 7 out of 10 described as sharp associated with dry cough and shortness of breath onset 2 days ago. Patient denies fever, chills, dyspnea on exertion, increasing peripheral edema, paroxysmal nocturnal dyspnea, abd pain, N/V. Patient has been using nebulizer and codeine cough suppressant at home with little relief. Was seen in [place] and diagnosed with pneumonia approximately one week ago he's been compliant with his penicillin and prednisone burst, which he finished 3 days ago. States it feels like prior episode of pneumonia for which she was admitted in July of this year.  Past Medical History  Diagnosis Date  . Hypertension   . Diabetes mellitus without complication   . GERD (gastroesophageal reflux disease)   . Pneumonia   . High cholesterol   . Pleurisy   . Bronchitis    History reviewed. No pertinent past surgical history. Family History  Problem Relation Age of Onset  . Asthma Mother   . Leukemia Mother    History  Substance Use Topics  . Smoking status: Former Smoker -- 2.00 packs/day for 30 years    Types: Cigarettes    Quit date: 09/02/1976  . Smokeless tobacco: Former Systems developer    Types: Chew    Quit date: 09/03/2003  . Alcohol Use: No    Review of Systems  Respiratory: Positive for shortness of breath.   Cardiovascular: Positive for  chest pain.    10 systems reviewed and found to be negative, except as noted in the HPI.  Allergies  Review of patient's allergies indicates no known allergies.  Home Medications   Prior to Admission medications   Medication Sig Start Date End Date Taking? Authorizing Provider  albuterol (PROVENTIL HFA;VENTOLIN HFA) 108 (90 BASE) MCG/ACT inhaler Inhale 2 puffs into the lungs every 6 (six) hours as needed for wheezing or shortness of breath.   Yes Historical Provider, MD  albuterol (PROVENTIL HFA;VENTOLIN HFA) 108 (90 BASE) MCG/ACT inhaler Inhale 2 puffs into the lungs every 4 (four) hours as needed for wheezing or shortness of breath. 06/28/14  Yes Virgel Manifold, MD  ALPRAZolam Duanne Moron) 0.5 MG tablet Take 1 tablet by mouth at bedtime as needed. anxiety 01/12/14  Yes Historical Provider, MD  amLODipine (NORVASC) 5 MG tablet Take 5 mg by mouth daily. 06/02/14  Yes Historical Provider, MD  amoxicillin (AMOXIL) 500 MG capsule Take 2 capsules (1,000 mg total) by mouth 2 (two) times daily. 57/84/69  Yes Delora Fuel, MD  aspirin EC 81 MG tablet Take 81 mg by mouth daily.   Yes Historical Provider, MD  JANUMET 50-1000 MG per tablet Take 1 tablet by mouth 2 (two) times daily. 01/12/14  Yes Historical Provider, MD  Multiple Vitamins-Minerals (MULTIVITAMINS THER. W/MINERALS) TABS tablet Take 1 tablet by mouth daily.   Yes Historical Provider, MD  pantoprazole (PROTONIX) 40 MG tablet Take 1 tablet (40 mg total) by mouth 2 (two) times daily before  a meal. 03/17/14  Yes Kelby Aline, MD  pravastatin (PRAVACHOL) 40 MG tablet Take 1 tablet by mouth daily. 01/01/14  Yes Historical Provider, MD  chlorpheniramine-HYDROcodone (TUSSIONEX PENNKINETIC ER) 10-8 MG/5ML LQCR Take 5 mLs by mouth every 12 (twelve) hours as needed for cough (Cough). 07/22/14   Phillp Dolores, PA-C  levofloxacin (LEVAQUIN) 500 MG tablet Take 1 tablet (500 mg total) by mouth daily. X 7 days 07/22/14   Elmyra Ricks Yanna Leaks, PA-C  predniSONE  (DELTASONE) 20 MG tablet Take 2 tablets (40 mg total) by mouth daily. 48/54/62   Delora Fuel, MD   BP 703/50 mmHg  Pulse 100  Temp(Src) 98.7 F (37.1 C) (Oral)  Resp 24  Ht 6' (1.829 m)  Wt 230 lb (104.327 kg)  BMI 31.19 kg/m2  SpO2 95% Physical Exam  Constitutional: He is oriented to person, place, and time. He appears well-developed and well-nourished. No distress.  HENT:  Head: Normocephalic.  Eyes: Conjunctivae and EOM are normal.  Cardiovascular: Normal rate.   Pulmonary/Chest: Effort normal. No stridor. No respiratory distress. He has no wheezes. He has no rales. He exhibits tenderness.    Musculoskeletal: Normal range of motion.  No calf asymmetry, superficial collaterals, palpable cords, edema, Homans sign negative bilaterally.    Neurological: He is alert and oriented to person, place, and time.  Psychiatric: He has a normal mood and affect.  Nursing note and vitals reviewed.   ED Course  Procedures (including critical care time) Labs Review Labs Reviewed  CBC - Abnormal; Notable for the following:    WBC 10.8 (*)    RDW 15.8 (*)    All other components within normal limits  BASIC METABOLIC PANEL - Abnormal; Notable for the following:    Glucose, Bld 165 (*)    GFR calc non Af Amer 89 (*)    All other components within normal limits  PRO B NATRIURETIC PEPTIDE  I-STAT TROPOININ, ED    Imaging Review Dg Chest 2 View  07/22/2014   CLINICAL DATA:  Chest pain and shortness of breath, cough for 1 day.  EXAM: CHEST  2 VIEW  COMPARISON:  Chest radiograph July 14, 2014 and  FINDINGS: Cardiac silhouette appears mildly enlarged, similar. Mediastinal silhouette is nonsuspicious. Mildly calcified aortic knob. Small LEFT pleural effusion. Hepatic eventration with bandlike density RIGHT lung base, similar. No pneumothorax. Soft tissue planes and included osseous structures are nonsuspicious. Mild degenerative change of thoracic spine.  IMPRESSION: Stable cardiomegaly,  small LEFT pleural effusion.  RIGHT lung base atelectasis.   Electronically Signed   By: Elon Alas   On: 07/22/2014 05:57     EKG Interpretation   Date/Time:  Friday July 22 2014 05:38:43 EST Ventricular Rate:  111 PR Interval:  160 QRS Duration: 84 QT Interval:  308 QTC Calculation: 418 R Axis:   19 Text Interpretation:  Sinus tachycardia Nonspecific T wave abnormality  Abnormal ECG No significant change since last tracing Confirmed by  Winfred Leeds  MD, SAM [phone]) on 07/22/2014 6:05:47 AM      MDM   Final diagnoses:  Bronchitis    Filed Vitals:   07/22/14 0600 07/22/14 0615 07/22/14 0630 07/22/14 0645  BP: 120/71 122/63 113/61 124/66  Pulse: 102 102 95 100  Temp:      TempSrc:      Resp: 20  24   Height:      Weight:      SpO2: 95% 96% 100% 95%    Medications  levofloxacin (LEVAQUIN) tablet 500  mg (not administered)  chlorpheniramine-HYDROcodone (TUSSIONEX) 10-8 MG/5ML suspension 5 mL (5 mLs Oral Given 07/22/14 0623)  ipratropium-albuterol (DUONEB) 0.5-2.5 (3) MG/3ML nebulizer solution 3 mL (3 mLs Nebulization Given 07/22/14 0623)  sodium chloride 0.9 % bolus 500 mL (500 mLs Intravenous New Bag/Given 07/22/14 8416)    KAYSHAUN POLANCO is a 63 y.o. male presenting with dry cough pleuritic left chest pain and shortness of breath worsening over the course of 2 days actively being treated for pneumonia with penicillin. Patient has had multiple visits for similar complaints. Was admitted in July and had pulmonary consult which did not show significant chronic lung disease. Chest x-ray with small left-sided pleural effusion which has been seen on prior imaging studies. Lung sounds are clear to auscultation, patient saturating well on room air. Chest is tender and pain is reproducible to palpation. EKG shows sinus tachycardia at 111 bpm, unchanged from prior with nonspecific T-wave changes.  Blood work unremarkable.   This is a shared visit with the attending  physician who personally evaluated the patient and agrees with the care plan.    Evaluation does not show pathology that would require ongoing emergent intervention or inpatient treatment. Pt is hemodynamically stable and mentating appropriately. Discussed findings and plan with patient/guardian, who agrees with care plan. All questions answered. Return precautions discussed and outpatient follow up given.   New Prescriptions   CHLORPHENIRAMINE-HYDROCODONE (TUSSIONEX PENNKINETIC ER) 10-8 MG/5ML LQCR    Take 5 mLs by mouth every 12 (twelve) hours as needed for cough (Cough).   LEVOFLOXACIN (LEVAQUIN) 500 MG TABLET    Take 1 tablet (500 mg total) by mouth daily. X 7 days         Monico Blitz, PA-C 07/22/14 6063  Johnna Acosta, MD 07/22/14 2112

## 2014-08-04 ENCOUNTER — Encounter (INDEPENDENT_AMBULATORY_CARE_PROVIDER_SITE_OTHER): Payer: Self-pay

## 2014-08-04 ENCOUNTER — Encounter: Payer: Self-pay | Admitting: Internal Medicine

## 2014-08-04 ENCOUNTER — Ambulatory Visit (INDEPENDENT_AMBULATORY_CARE_PROVIDER_SITE_OTHER): Payer: BC Managed Care – PPO | Admitting: Internal Medicine

## 2014-08-04 VITALS — BP 128/78 | HR 114 | Ht 72.0 in | Wt 231.0 lb

## 2014-08-04 DIAGNOSIS — R05 Cough: Secondary | ICD-10-CM

## 2014-08-04 DIAGNOSIS — I10 Essential (primary) hypertension: Secondary | ICD-10-CM

## 2014-08-04 DIAGNOSIS — R0609 Other forms of dyspnea: Secondary | ICD-10-CM | POA: Insufficient documentation

## 2014-08-04 DIAGNOSIS — R0600 Dyspnea, unspecified: Secondary | ICD-10-CM

## 2014-08-04 DIAGNOSIS — R0602 Shortness of breath: Secondary | ICD-10-CM | POA: Insufficient documentation

## 2014-08-04 DIAGNOSIS — R058 Other specified cough: Secondary | ICD-10-CM

## 2014-08-04 MED ORDER — FAMOTIDINE 20 MG PO TABS: ORAL_TABLET | ORAL | Status: DC

## 2014-08-04 MED ORDER — NEBIVOLOL HCL 5 MG PO TABS: 5.0000 mg | ORAL_TABLET | Freq: Every day | ORAL | Status: DC

## 2014-08-04 NOTE — Progress Notes (Signed)
Subjective:    Patient ID: Douglas Mason, male    DOB: [birth year]    MRN: [id number]  HPI  48 yowm quit smoking 1979 due to daugher's allergies with new onset  L CP abrupt onset 01/27/14 in setting of chronic am cough on ACEi (stopped 03/10/14 ) with neg w/u and pain resolved by Oct 2015 but persistent sob assoc with wheezing but refractory to rx with even prednisone so referred by Delphina Cahill to pulmonary clinic 08/04/2014    08/04/2014 1st [facility] office visit/ Joan Avetisyan   Chief Complaint  Patient presents with  . Pulmonary Consult    Referred by Dr. Wende Neighbors. Pt c/o SOB for the past 6 months. He is SOB with or without any exertion "comes and goes".  He also c/o non prod cough   sudden pattern of lose breath x few secs every day no change since onset 01/27/14  New pattern of doe x steps at usual residence stops at top no change after neb but does help coughing which is dry and worse when lies down Already on ppi but not taking bid ac as rec Neither Symptoms nor need for saba changed while on prednisone pfts neg for any obst 03/15/14   No obvious other patterns in day to day or daytime variabilty or asso  cp or chest tightness, subjective wheeze overt sinus or hb symptoms. No unusual exp hx or h/o childhood pna/ asthma or knowledge of premature birth.  Sleeping ok without nocturnal  or early am exacerbation  of respiratory  c/o's or need for noct saba. Also denies any obvious fluctuation of symptoms with weather or environmental changes or other aggravating or alleviating factors except as outlined above   Current Medications, Allergies, Complete Past Medical History, Past Surgical History, Family History, and Social History were reviewed in [employer] record.                Review of Systems  Constitutional: Positive for appetite change. Negative for fever, chills, activity change and unexpected weight change.  HENT: Negative for congestion, dental  problem, postnasal drip, rhinorrhea, sneezing, sore throat, trouble swallowing and voice change.   Eyes: Negative for visual disturbance.  Respiratory: Positive for cough and shortness of breath. Negative for choking.   Cardiovascular: Negative for chest pain and leg swelling.  Gastrointestinal: Negative for nausea, vomiting and abdominal pain.  Genitourinary: Negative for difficulty urinating.  Musculoskeletal: Negative for arthralgias.  Skin: Negative for rash.  Psychiatric/Behavioral: Negative for behavioral problems and confusion.       Objective:   Physical Exam  amb obese wm nad  Wt Readings from Last 3 Encounters:  08/04/14 231 lb (104.781 kg)  07/22/14 230 lb (104.327 kg)  07/14/14 234 lb (106.142 kg)    Vital signs reviewed   HEENT: nl dentition, turbinates, and orophanx. Nl external ear canals without cough reflex   NECK :  without JVD/Nodes/TM/ nl carotid upstrokes bilaterally   LUNGS: no acc muscle use, clear to A and P bilaterally without cough on insp or exp maneuvers   CV:  RRR  no s3 or murmur or increase in P2, no edema   ABD:  soft and nontender with nl excursion in the supine position. No bruits or organomegaly, bowel sounds nl  MS:  warm without deformities, calf tenderness, cyanosis or clubbing  SKIN: warm and dry without lesions    NEURO:  alert, approp, no deficits    cxr 11/201/5 Small lung volumes  Stable cardiomegaly, small LEFT pleural effusion. RIGHT lung base atelectasis.    Chemistry      Component Value Date/Time   NA 138 07/22/2014 0600   K 4.3 07/22/2014 0600   CL 98 07/22/2014 0600   CO2 26 07/22/2014 0600   BUN 15 07/22/2014 0600   CREATININE 0.87 07/22/2014 0600      Component Value Date/Time   CALCIUM 9.3 07/22/2014 0600   ALKPHOS 73 03/13/2014 0828   AST 30 03/13/2014 0828   ALT 47 03/13/2014 0828   BILITOT 1.3* 03/13/2014 0828       Lab Results  Component Value Date   TSH 1.330 03/13/2014     Lab Results    Component Value Date   PROBNP 13.4 07/22/2014     Lab Results  Component Value Date   ESRSEDRATE 66* 03/13/2014        Lab Results  Component Value Date   DDIMER 2.63* 01/27/2014  CTa same date neg PE           Assessment & Plan:

## 2014-08-04 NOTE — Patient Instructions (Addendum)
Try using half vial nebulizer solution up to 4 hours to see if helps either cough or breathing - if not helping don't take   Stop amlodipine and take bystolic 5 mg daily in its place   Protonix 40 mg Take 30- 60 min before your first and last meals of the day   Add pepcid 20 mg and chlortrimeton 4 mg both at bedtime (both over the counter)  GERD (REFLUX)  is an extremely common cause of respiratory symptoms just like yours , many times with no obvious heartburn at all.    It can be treated with medication, but also with lifestyle changes including avoidance of late meals, excessive alcohol, smoking cessation, and avoid fatty foods, chocolate, peppermint, colas, red wine, and acidic juices such as orange juice.  NO MINT OR MENTHOL PRODUCTS SO NO COUGH DROPS  USE SUGARLESS CANDY INSTEAD (Jolley ranchers or Stover's or Life Savers) or even ice chips will also do - the key is to swallow to prevent all throat clearing. NO OIL BASED VITAMINS - use powdered substitutes.  See Tammy NP in  2 weeks with all your medications, even over the counter meds, separated in two separate bags, the ones you take no matter what vs the ones you stop once you feel better and take only as needed when you feel you need them.   Tammy  will generate for you a new user friendly medication calendar that will put [place] all on the same page re: your medication use.     Without this process, it simply isn't possible to assure that we are providing  your outpatient care  with  the attention to detail we feel you deserve.   If we cannot assure that you're getting that kind of care,  then we cannot manage your problem effectively from this clinic.  Once you have seen Tammy and we are sure that we're all on the same page with your medication use she will arrange follow up with me.   Please remember to go to the lab and x-ray department downstairs for your tests - we will call you with the results when they are available.

## 2014-08-04 NOTE — Assessment & Plan Note (Addendum)
-   PFT s  714/15 no obst/ VC 2.82   - 08/04/2014  Walked RA x 3 laps @ 185 ft each stopped due to  End of study, rapid pace, no desat  - 08/04/14  Spirometry wnl with VC 2.86   Symptoms are markedly disproportionate to objective findings and not clear this is a lung problem but pt does appear to have difficult airway management issues.  DDX of  difficult airways management all start with A and  include Adherence, Ace Inhibitors, Acid Reflux, Active Sinus Disease, Alpha 1 Antitripsin deficiency, Anxiety masquerading as Airways dz,  ABPA,  allergy(esp in young), Aspiration (esp in elderly), Adverse effects of DPI,  Active smokers, plus two Bs  = Bronchiectasis and Beta blocker use..and one C= CHF  Adherence is always the initial "prime suspect" and is a multilayered concern that requires a "trust but verify" approach in every patient - starting with knowing how to use medications, especially inhalers, correctly, keeping up with refills and understanding the fundamental difference between maintenance and prns vs those medications only taken for a very short course and then stopped and not refilled.  - confused with how to take meds.  To keep things simple, I have asked the patient to first separate medicines that are perceived as maintenance, that is to be taken daily "no matter what", from those medicines that are taken on only on an as-needed basis and I have given the patient examples of both, and then return to see our NP to generate a  detailed  medication calendar which should be followed until the next physician sees the patient and updates it.    ? Acid (or non-acid) GERD > always difficult to exclude as up to 75% of pts in some series report no assoc GI/ Heartburn symptoms> rec max (24h)  acid suppression and diet restrictions/ reviewed and instructions given in writing. Also try off amlodipine and just use bystolic for now for hbp (see hbp)  ? Anxiety > certainly a concern here based on sob at rest  not reproduced or proportionate to ex  ? chf > excluded with bnp << 100   Allergy/ asthma unlikely with two perfectly nl pfts with symptoms   ACEi  > off as of 03/10/14

## 2014-08-07 NOTE — Assessment & Plan Note (Addendum)
Strongly prefer in this setting: Bystolic, the most beta -1  selective Beta blocker available in sample form, with bisoprolol the most selective generic choice  on the market.   Try bystolic 5 mg daily    Struggling with concept of med reconciliation.   To keep things simple, I have asked the patient to first separate medicines that are perceived as maintenance, that is to be taken daily "no matter what", from those medicines that are taken on only on an as-needed basis and I have given the patient examples of both, and then return to see our NP to generate a  detailed  medication calendar which should be followed until the next physician sees the patient and updates it.

## 2014-08-07 NOTE — Assessment & Plan Note (Signed)
Classic Upper airway cough syndrome, so named because it's frequently impossible to sort out how much is  CR/sinusitis with freq throat clearing (which can be related to primary GERD)   vs  causing  secondary (" extra esophageal")  GERD from wide swings in gastric pressure that occur with throat clearing, often  promoting self use of mint and menthol lozenges that reduce the lower esophageal sphincter tone and exacerbate the problem further in a cyclical fashion.   These are the same pts (now being labeled as having "irritable larynx syndrome" by some cough centers) who not infrequently have a history of having failed to tolerate ace inhibitors,  dry powder inhalers or biphosphonates or report having atypical reflux symptoms that don't respond to standard doses of PPI , and are easily confused as having aecopd or asthma flares by even experienced allergists/ pulmonologists.   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

## 2014-08-18 ENCOUNTER — Ambulatory Visit (INDEPENDENT_AMBULATORY_CARE_PROVIDER_SITE_OTHER): Payer: BC Managed Care – PPO | Admitting: Adult Health

## 2014-08-18 ENCOUNTER — Encounter: Payer: Self-pay | Admitting: Adult Health

## 2014-08-18 VITALS — BP 118/60 | HR 60 | Temp 98.1°F | Ht 72.0 in | Wt 235.2 lb

## 2014-08-18 DIAGNOSIS — R0600 Dyspnea, unspecified: Secondary | ICD-10-CM

## 2014-08-18 MED ORDER — CHLORPHENIRAMINE MALEATE 4 MG PO TABS: 4.0000 mg | ORAL_TABLET | Freq: Every evening | ORAL | Status: DC | PRN

## 2014-08-18 MED ORDER — ALBUTEROL SULFATE (2.5 MG/3ML) 0.083% IN NEBU
2.5000 mg | INHALATION_SOLUTION | Freq: Four times a day (QID) | RESPIRATORY_TRACT | Status: AC | PRN
Start: 2014-08-18 — End: ?

## 2014-08-18 MED ORDER — NEBIVOLOL HCL 5 MG PO TABS: 5.0000 mg | ORAL_TABLET | Freq: Every day | ORAL | Status: DC

## 2014-08-18 NOTE — Assessment & Plan Note (Signed)
Dyspnea and upper airway cough ? Etiology  Possible triggers of GERD and AR  Improved with trigger control and d/c CCB  ? Significance of small pleural effusion  On CXR  CT chest in 12/2013 neg for PE  Consider repeat cxr on return in 3 months   Plan  Continue on current regimen  Follow up with your family doctor for blood pressure control as planned .  Continue activity as tolerated  Follow up Dr. Melvyn Novas  In 3 months and As needed

## 2014-08-18 NOTE — Progress Notes (Signed)
Subjective:    Patient ID: GOLDEN GILREATH, male    DOB: 1950/09/22    MRN: [id number]  HPI  47 yowm quit smoking 1979 due to daugher's allergies with new onset  L CP abrupt onset 01/27/14 in setting of chronic am cough on ACEi (stopped 03/10/14 ) with neg w/u and pain resolved by Oct 2015 but persistent sob assoc with wheezing but refractory to rx with even prednisone so referred by Delphina Cahill to pulmonary clinic 08/04/2014    08/04/2014 1st [facility] office visit/ W[facility]   Chief Complaint  Patient presents with  . Pulmonary Consult    Referred by Dr. Wende Neighbors. Pt c/o SOB for the past 6 months. He is SOB with or without any exertion "comes and goes".  He also c/o non prod cough   sudden pattern of lose breath x few secs every day no change since onset 01/27/14  New pattern of doe x steps at usual residence stops at top no change after neb but does help coughing which is dry and worse when lies down Already on ppi but not taking bid ac as rec Neither Symptoms nor need for saba changed while on prednisone pfts neg for any obst 03/15/14  >>changed norvasc to bystolic , PPI/pepcid   31/51/7616 Follow up -Cough and Dyspnea  Patient returns for a two-week follow-up Patient was seen for primary consult. Last visit for shortness of breath and cough over the last 6 months That she's been having intermittent episodes of severe coughing fits. Patient underwent previous primary function test that showed no airway obstruction, and a repeat spirometry with no significant change. Recent chest x-ray showed a small left pleural effusion, and bibasilar atelectasis. Patient states that since last visit. He is feeling much improved. His cough has totally resolved. And his shortness of breath seems to be much improved. Patient says he's return to his activity levels and is able to walk up steps without any significant shortness of breath Last visit. He was placed on aggressive reflux regimen with  Protonix and Pepcid He was also changed from Norvasc to Bystolic. He denies any hemoptysis, chest pain, orthopnea, PND, leg swelling, unintentional weight loss. We reviewed all his meds and updated his epic med list w/ pt education.  Current Medications, Allergies, Complete Past Medical History, Past Surgical History, Family History, and Social History were reviewed in [employer] record.           Review of Systems  Constitutional: . Negative for fever, chills, activity change and unexpected weight change.  HENT: Negative for congestion, dental problem, postnasal drip, rhinorrhea, sneezing, sore throat, trouble swallowing and voice change.   Eyes: Negative for visual disturbance.  Respiratory:  Negative for choking.   Cardiovascular: Negative for chest pain and leg swelling.  Gastrointestinal: Negative for nausea, vomiting and abdominal pain.  Genitourinary: Negative for difficulty urinating.  Musculoskeletal: Negative for arthralgias.  Skin: Negative for rash.  Psychiatric/Behavioral: Negative for behavioral problems and confusion.       Objective:   Physical Exam  amb obese wm nad  Vital signs reviewed   HEENT: nl dentition, turbinates, and orophanx. Nl external ear canals without cough reflex   NECK :  without JVD/Nodes/TM/ nl carotid upstrokes bilaterally   LUNGS: no acc muscle use, clear to A and P bilaterally without cough on insp or exp maneuvers   CV:  RRR  no s3 or murmur or increase in P2, no edema   ABD:  soft and nontender with nl excursion in the supine position. No bruits or organomegaly, bowel sounds nl  MS:  warm without deformities, calf tenderness, cyanosis or clubbing  SKIN: warm and dry without lesions    NEURO:  alert, approp, no deficits    cxr 11/201/5 Small lung volumes Stable cardiomegaly, small LEFT pleural effusion. RIGHT lung base atelectasis.    Chemistry      Component Value Date/Time   NA 138  07/22/2014 0600   K 4.3 07/22/2014 0600   CL 98 07/22/2014 0600   CO2 26 07/22/2014 0600   BUN 15 07/22/2014 0600   CREATININE 0.87 07/22/2014 0600      Component Value Date/Time   CALCIUM 9.3 07/22/2014 0600   ALKPHOS 73 03/13/2014 0828   AST 30 03/13/2014 0828   ALT 47 03/13/2014 0828   BILITOT 1.3* 03/13/2014 0828       Lab Results  Component Value Date   TSH 1.330 03/13/2014     Lab Results  Component Value Date   PROBNP 13.4 07/22/2014     Lab Results  Component Value Date   ESRSEDRATE 66* 03/13/2014        Lab Results  Component Value Date   DDIMER 2.63* 01/27/2014  CTa same date neg PE           Assessment & Plan:

## 2014-08-18 NOTE — Patient Instructions (Addendum)
Continue on current regimen  Follow up with your family doctor for blood pressure control as planned .  Continue activity as tolerated  Follow up Dr. Melvyn Novas  In 3 months and As needed

## 2014-08-19 NOTE — Progress Notes (Signed)
Notes/ xrays reviewed and agree with a/p re effusion/ f/u in 3 m

## 2014-10-10 ENCOUNTER — Other Ambulatory Visit: Payer: Self-pay | Admitting: Adult Health

## 2014-10-10 NOTE — Telephone Encounter (Signed)
Pt last seen 60.73.71 by TP and Bystolic refilled at that time: Patient Instructions       Continue on current regimen   Follow up with your family doctor for blood pressure control as planned .   Continue activity as tolerated   Follow up Dr. Melvyn Novas  In 3 months and As needed     Denial sent to pharmacy Will sign off

## 2014-10-15 ENCOUNTER — Other Ambulatory Visit: Payer: Self-pay | Admitting: Adult Health

## 2014-10-25 DIAGNOSIS — E782 Mixed hyperlipidemia: Secondary | ICD-10-CM | POA: Diagnosis not present

## 2014-10-25 DIAGNOSIS — E119 Type 2 diabetes mellitus without complications: Secondary | ICD-10-CM | POA: Diagnosis not present

## 2014-10-25 DIAGNOSIS — Z23 Encounter for immunization: Secondary | ICD-10-CM | POA: Diagnosis not present

## 2014-10-25 DIAGNOSIS — Z6833 Body mass index (BMI) 33.0-33.9, adult: Secondary | ICD-10-CM | POA: Diagnosis not present

## 2014-12-26 ENCOUNTER — Other Ambulatory Visit: Payer: Self-pay | Admitting: Adult Health

## 2014-12-28 ENCOUNTER — Other Ambulatory Visit: Payer: Self-pay | Admitting: Adult Health

## 2015-01-03 ENCOUNTER — Ambulatory Visit (INDEPENDENT_AMBULATORY_CARE_PROVIDER_SITE_OTHER): Payer: Medicare Other | Admitting: Internal Medicine

## 2015-01-03 ENCOUNTER — Encounter: Payer: Self-pay | Admitting: Internal Medicine

## 2015-01-03 VITALS — BP 124/82 | HR 95 | Ht 72.0 in | Wt 245.0 lb

## 2015-01-03 DIAGNOSIS — I10 Essential (primary) hypertension: Secondary | ICD-10-CM

## 2015-01-03 DIAGNOSIS — R0600 Dyspnea, unspecified: Secondary | ICD-10-CM

## 2015-01-03 NOTE — Assessment & Plan Note (Signed)
Changed CCB to bystolic 5 mg trial for ? GERD> cough / sob   Adequate control on present rx, reviewed > no change in rx needed  But longterm could probably change to a cheaper generic if needed but defer refills of  bystolic and choice of rx entirely to Dr Juel Burrow capable hands.

## 2015-01-03 NOTE — Progress Notes (Signed)
Subjective:    Patient ID: Douglas Mason, male    DOB: Nov 29, 1950    MRN: [id number]    Brief patient profile:  65 yowm quit smoking 1979 due to daugher's allergies with new onset  L CP abrupt onset 01/27/14 in setting of chronic am cough on ACEi (stopped 03/10/14 ) with neg w/u and pain resolved by Oct 2015 but persistent sob assoc with wheezing but refractory to rx with even prednisone so referred by [facility] to [facility] 08/04/2014     History of Present Illness  08/04/2014 1st [facility] office visit/ Douglas Mason   Chief Complaint  Patient presents with  . Mason Consult    Referred by Douglas Mason. Pt c/o SOB for the past 6 months. He is SOB with or without any exertion "comes and goes".  He also c/o non prod cough   sudden pattern of lose breath x few secs every day no change since onset 01/27/14  New pattern of doe x steps at usual residence stops at top no change after neb but does help coughing which is dry and worse when lies down Already on ppi but not taking bid ac as rec Neither Symptoms nor need for saba changed while on prednisone pfts neg for any obst 03/15/14  >>changed norvasc to bystolic , PPI/pepcid        01/03/2015 f/u ov/Douglas Mason re: pseudoasthma/ upper airway cough syndrome   Chief Complaint  Patient presents with  . Follow-up    Pt states breathing is much improved and his cough has resolved. No use of albuterol recently.   chronic doe = MMRC = 0/1 (only sob with hills/ steps but walks nl pace with both)  No noct  Events  Confused with which GERD meds he's still taking (was ppi ac and h2 hs) but following up with Dr [name] now  No obvious day to day or daytime variabilty or assoc chronic cough or cp or chest tightness, subjective wheeze overt sinus or hb symptoms. No unusual exp hx or h/o childhood pna/ asthma or knowledge of premature birth.  Sleeping ok without nocturnal  or early am exacerbation  of respiratory  c/o's or need for noct saba. Also  denies any obvious fluctuation of symptoms with weather or environmental changes or other aggravating or alleviating factors except as outlined above   Current Medications, Allergies, Complete Past Medical History, Past Surgical History, Family History, and Social History were reviewed in [employer] record.  ROS  The following are not active complaints unless bolded sore throat, dysphagia, dental problems, itching, sneezing,  nasal congestion or excess/ purulent secretions, ear ache,   fever, chills, sweats, unintended wt loss, pleuritic or exertional cp, hemoptysis,  orthopnea pnd or leg swelling, presyncope, palpitations, heartburn, abdominal pain, anorexia, nausea, vomiting, diarrhea  or change in bowel or urinary habits, change in stools or urine, dysuria,hematuria,  rash, arthralgias, visual complaints, headache, numbness weakness or ataxia or problems with walking or coordination,  change in mood/affect or memory.               Objective:   Physical Exam  amb obese wm nad  Wt Readings from Last 3 Encounters:  01/03/15 245 lb (111.131 kg)  08/18/14 235 lb 3.2 oz (106.686 kg)  08/04/14 231 lb (104.781 kg)    Vital signs reviewed   HEENT: nl dentition, turbinates, and orophanx. Nl external ear canals without cough reflex   NECK :  without JVD/Nodes/TM/ nl carotid  upstrokes bilaterally   LUNGS: no acc muscle use, clear to A and P bilaterally without cough on insp or exp maneuvers   CV:  RRR  no s3 or murmur or increase in P2, no edema   ABD:  soft and nontender with nl excursion in the supine position. No bruits or organomegaly, bowel sounds nl  MS:  warm without deformities, calf tenderness, cyanosis or clubbing  SKIN: warm and dry without lesions    NEURO:  alert, approp, no deficits             Assessment & Plan:

## 2015-01-03 NOTE — Patient Instructions (Signed)
You do not appear to have a significant lung problem   You do a have a problem with your weight in your abdomen that puts pressure on your stomach and may make you cough /wheeze/ and short of breath  Work on weight and continue the diet   If you are satisfied with your treatment plan,  let your doctor know and he/she can either refill your medications or you can return here when your prescription runs out.     If in any way you are not 100% satisfied,  please tell us.  If 100% better, tell your friends!  Pulmonary follow up is as needed

## 2015-01-03 NOTE — Assessment & Plan Note (Signed)
-   PFT s  714/15 no obst/ VC 2.82   - 08/04/2014  Walked RA x 3 laps @ 185 ft each stopped due to  End of study, rapid pace, no desat  - 08/04/14  Spirometry wnl with VC 2.86    I had an extended final summary discussion with the patient and daughter reviewing all relevant studies completed to date and  lasting 15 to 20 minutes of a 25 minute visit on the following issues:    1) no evidence at all for asthma or copd  2) problem is best characterized as    Upper airway cough syndrome, so named because it's frequently impossible to sort out how much is  CR/sinusitis with freq throat clearing (which can be related to primary GERD)   vs  causing  secondary (" extra esophageal")  GERD from wide swings in gastric pressure that occur with throat clearing, often  promoting self use of mint and menthol lozenges that reduce the lower esophageal sphincter tone and exacerbate the problem further in a cyclical fashion.   These are the same pts (now being labeled as having "irritable larynx syndrome" by some cough centers) who not infrequently have a history of having failed to tolerate ace inhibitors,  dry powder inhalers or biphosphonates or report having atypical reflux symptoms that don't respond to standard doses of PPI , and are easily confused as having aecopd or asthma flares by even experienced allergists/ pulmonologists.   3) longterm key is controlling gerd with diet/ meds/ getting wt down by neg cal balance > f/u Dr [name]

## 2015-02-20 ENCOUNTER — Other Ambulatory Visit: Payer: Self-pay | Admitting: Adult Health

## 2015-02-21 ENCOUNTER — Other Ambulatory Visit: Payer: Self-pay | Admitting: Adult Health

## 2015-03-22 DIAGNOSIS — E782 Mixed hyperlipidemia: Secondary | ICD-10-CM | POA: Diagnosis not present

## 2015-03-22 DIAGNOSIS — I10 Essential (primary) hypertension: Secondary | ICD-10-CM | POA: Diagnosis not present

## 2015-03-22 DIAGNOSIS — E119 Type 2 diabetes mellitus without complications: Secondary | ICD-10-CM | POA: Diagnosis not present

## 2015-03-24 DIAGNOSIS — E119 Type 2 diabetes mellitus without complications: Secondary | ICD-10-CM | POA: Diagnosis not present

## 2015-03-24 DIAGNOSIS — E782 Mixed hyperlipidemia: Secondary | ICD-10-CM | POA: Diagnosis not present

## 2015-03-24 DIAGNOSIS — R945 Abnormal results of liver function studies: Secondary | ICD-10-CM | POA: Diagnosis not present

## 2015-05-05 DIAGNOSIS — E119 Type 2 diabetes mellitus without complications: Secondary | ICD-10-CM | POA: Diagnosis not present

## 2015-05-05 DIAGNOSIS — Z23 Encounter for immunization: Secondary | ICD-10-CM | POA: Diagnosis not present

## 2015-07-04 DIAGNOSIS — E119 Type 2 diabetes mellitus without complications: Secondary | ICD-10-CM | POA: Diagnosis not present

## 2015-07-04 DIAGNOSIS — I10 Essential (primary) hypertension: Secondary | ICD-10-CM | POA: Diagnosis not present

## 2015-07-07 DIAGNOSIS — K219 Gastro-esophageal reflux disease without esophagitis: Secondary | ICD-10-CM | POA: Diagnosis not present

## 2015-07-07 DIAGNOSIS — Z23 Encounter for immunization: Secondary | ICD-10-CM | POA: Diagnosis not present

## 2015-07-07 DIAGNOSIS — E782 Mixed hyperlipidemia: Secondary | ICD-10-CM | POA: Diagnosis not present

## 2015-07-07 DIAGNOSIS — E119 Type 2 diabetes mellitus without complications: Secondary | ICD-10-CM | POA: Diagnosis not present

## 2015-07-07 DIAGNOSIS — I10 Essential (primary) hypertension: Secondary | ICD-10-CM | POA: Diagnosis not present

## 2015-08-09 DIAGNOSIS — J219 Acute bronchiolitis, unspecified: Secondary | ICD-10-CM | POA: Diagnosis not present

## 2016-01-08 DIAGNOSIS — E782 Mixed hyperlipidemia: Secondary | ICD-10-CM | POA: Diagnosis not present

## 2016-01-08 DIAGNOSIS — E119 Type 2 diabetes mellitus without complications: Secondary | ICD-10-CM | POA: Diagnosis not present

## 2016-01-08 DIAGNOSIS — I10 Essential (primary) hypertension: Secondary | ICD-10-CM | POA: Diagnosis not present

## 2016-01-10 DIAGNOSIS — K219 Gastro-esophageal reflux disease without esophagitis: Secondary | ICD-10-CM | POA: Diagnosis not present

## 2016-01-10 DIAGNOSIS — I10 Essential (primary) hypertension: Secondary | ICD-10-CM | POA: Diagnosis not present

## 2016-01-10 DIAGNOSIS — E119 Type 2 diabetes mellitus without complications: Secondary | ICD-10-CM | POA: Diagnosis not present

## 2016-01-10 DIAGNOSIS — E782 Mixed hyperlipidemia: Secondary | ICD-10-CM | POA: Diagnosis not present

## 2016-05-08 DIAGNOSIS — R197 Diarrhea, unspecified: Secondary | ICD-10-CM | POA: Diagnosis not present

## 2016-05-09 DIAGNOSIS — R197 Diarrhea, unspecified: Secondary | ICD-10-CM | POA: Diagnosis not present

## 2016-07-11 DIAGNOSIS — I482 Chronic atrial fibrillation: Secondary | ICD-10-CM | POA: Diagnosis not present

## 2016-07-11 DIAGNOSIS — I10 Essential (primary) hypertension: Secondary | ICD-10-CM | POA: Diagnosis not present

## 2016-07-11 DIAGNOSIS — E782 Mixed hyperlipidemia: Secondary | ICD-10-CM | POA: Diagnosis not present

## 2016-07-11 DIAGNOSIS — E119 Type 2 diabetes mellitus without complications: Secondary | ICD-10-CM | POA: Diagnosis not present

## 2016-07-11 DIAGNOSIS — E039 Hypothyroidism, unspecified: Secondary | ICD-10-CM | POA: Diagnosis not present

## 2016-07-11 DIAGNOSIS — R7301 Impaired fasting glucose: Secondary | ICD-10-CM | POA: Diagnosis not present

## 2016-07-11 DIAGNOSIS — E785 Hyperlipidemia, unspecified: Secondary | ICD-10-CM | POA: Diagnosis not present

## 2016-07-11 DIAGNOSIS — N529 Male erectile dysfunction, unspecified: Secondary | ICD-10-CM | POA: Diagnosis not present

## 2016-07-16 DIAGNOSIS — F419 Anxiety disorder, unspecified: Secondary | ICD-10-CM | POA: Diagnosis not present

## 2016-07-16 DIAGNOSIS — I10 Essential (primary) hypertension: Secondary | ICD-10-CM | POA: Diagnosis not present

## 2016-07-16 DIAGNOSIS — Z6833 Body mass index (BMI) 33.0-33.9, adult: Secondary | ICD-10-CM | POA: Diagnosis not present

## 2016-07-16 DIAGNOSIS — K219 Gastro-esophageal reflux disease without esophagitis: Secondary | ICD-10-CM | POA: Diagnosis not present

## 2016-07-16 DIAGNOSIS — E119 Type 2 diabetes mellitus without complications: Secondary | ICD-10-CM | POA: Diagnosis not present

## 2016-07-16 DIAGNOSIS — E782 Mixed hyperlipidemia: Secondary | ICD-10-CM | POA: Diagnosis not present

## 2016-07-16 DIAGNOSIS — R945 Abnormal results of liver function studies: Secondary | ICD-10-CM | POA: Diagnosis not present

## 2017-01-02 DIAGNOSIS — E119 Type 2 diabetes mellitus without complications: Secondary | ICD-10-CM | POA: Diagnosis not present

## 2017-01-02 DIAGNOSIS — I10 Essential (primary) hypertension: Secondary | ICD-10-CM | POA: Diagnosis not present

## 2017-01-02 DIAGNOSIS — E782 Mixed hyperlipidemia: Secondary | ICD-10-CM | POA: Diagnosis not present

## 2017-01-04 DIAGNOSIS — K219 Gastro-esophageal reflux disease without esophagitis: Secondary | ICD-10-CM | POA: Diagnosis not present

## 2017-01-04 DIAGNOSIS — E119 Type 2 diabetes mellitus without complications: Secondary | ICD-10-CM | POA: Diagnosis not present

## 2017-01-04 DIAGNOSIS — F419 Anxiety disorder, unspecified: Secondary | ICD-10-CM | POA: Diagnosis not present

## 2017-01-04 DIAGNOSIS — I10 Essential (primary) hypertension: Secondary | ICD-10-CM | POA: Diagnosis not present

## 2017-01-04 DIAGNOSIS — Z6833 Body mass index (BMI) 33.0-33.9, adult: Secondary | ICD-10-CM | POA: Diagnosis not present

## 2017-01-04 DIAGNOSIS — E782 Mixed hyperlipidemia: Secondary | ICD-10-CM | POA: Diagnosis not present

## 2017-01-04 DIAGNOSIS — R945 Abnormal results of liver function studies: Secondary | ICD-10-CM | POA: Diagnosis not present

## 2017-03-24 ENCOUNTER — Other Ambulatory Visit: Payer: Self-pay

## 2017-07-17 DIAGNOSIS — I10 Essential (primary) hypertension: Secondary | ICD-10-CM | POA: Diagnosis not present

## 2017-07-17 DIAGNOSIS — E119 Type 2 diabetes mellitus without complications: Secondary | ICD-10-CM | POA: Diagnosis not present

## 2017-07-17 DIAGNOSIS — E782 Mixed hyperlipidemia: Secondary | ICD-10-CM | POA: Diagnosis not present

## 2017-07-19 DIAGNOSIS — F411 Generalized anxiety disorder: Secondary | ICD-10-CM | POA: Diagnosis not present

## 2017-07-19 DIAGNOSIS — K219 Gastro-esophageal reflux disease without esophagitis: Secondary | ICD-10-CM | POA: Diagnosis not present

## 2017-07-19 DIAGNOSIS — I10 Essential (primary) hypertension: Secondary | ICD-10-CM | POA: Diagnosis not present

## 2017-07-19 DIAGNOSIS — E782 Mixed hyperlipidemia: Secondary | ICD-10-CM | POA: Diagnosis not present

## 2018-01-12 DIAGNOSIS — E782 Mixed hyperlipidemia: Secondary | ICD-10-CM | POA: Diagnosis not present

## 2018-01-12 DIAGNOSIS — E119 Type 2 diabetes mellitus without complications: Secondary | ICD-10-CM | POA: Diagnosis not present

## 2018-01-12 DIAGNOSIS — I10 Essential (primary) hypertension: Secondary | ICD-10-CM | POA: Diagnosis not present

## 2018-01-14 DIAGNOSIS — E782 Mixed hyperlipidemia: Secondary | ICD-10-CM | POA: Diagnosis not present

## 2018-01-14 DIAGNOSIS — K219 Gastro-esophageal reflux disease without esophagitis: Secondary | ICD-10-CM | POA: Diagnosis not present

## 2018-01-14 DIAGNOSIS — I10 Essential (primary) hypertension: Secondary | ICD-10-CM | POA: Diagnosis not present

## 2018-01-14 DIAGNOSIS — F419 Anxiety disorder, unspecified: Secondary | ICD-10-CM | POA: Diagnosis not present

## 2018-01-14 DIAGNOSIS — E1165 Type 2 diabetes mellitus with hyperglycemia: Secondary | ICD-10-CM | POA: Diagnosis not present

## 2018-01-14 DIAGNOSIS — Z0000 Encounter for general adult medical examination without abnormal findings: Secondary | ICD-10-CM | POA: Diagnosis not present

## 2018-01-14 DIAGNOSIS — Z6832 Body mass index (BMI) 32.0-32.9, adult: Secondary | ICD-10-CM | POA: Diagnosis not present

## 2018-04-28 ENCOUNTER — Emergency Department (HOSPITAL_COMMUNITY)
Admission: EM | Admit: 2018-04-28 | Discharge: 2018-04-28 | Disposition: A | Discharge status: Home / Self Care | Payer: Medicare Other | Attending: Emergency Medicine | Admitting: Emergency Medicine

## 2018-04-28 ENCOUNTER — Other Ambulatory Visit: Payer: Self-pay

## 2018-04-28 ENCOUNTER — Encounter (HOSPITAL_COMMUNITY): Payer: Self-pay | Admitting: Emergency Medicine

## 2018-04-28 ENCOUNTER — Emergency Department (HOSPITAL_COMMUNITY): Payer: Medicare Other

## 2018-04-28 DIAGNOSIS — R0600 Dyspnea, unspecified: Secondary | ICD-10-CM | POA: Diagnosis not present

## 2018-04-28 DIAGNOSIS — Z79899 Other long term (current) drug therapy: Secondary | ICD-10-CM | POA: Diagnosis not present

## 2018-04-28 DIAGNOSIS — Z7982 Long term (current) use of aspirin: Secondary | ICD-10-CM | POA: Insufficient documentation

## 2018-04-28 DIAGNOSIS — E119 Type 2 diabetes mellitus without complications: Secondary | ICD-10-CM | POA: Insufficient documentation

## 2018-04-28 DIAGNOSIS — B9789 Other viral agents as the cause of diseases classified elsewhere: Secondary | ICD-10-CM | POA: Diagnosis not present

## 2018-04-28 DIAGNOSIS — Z7984 Long term (current) use of oral hypoglycemic drugs: Secondary | ICD-10-CM | POA: Insufficient documentation

## 2018-04-28 DIAGNOSIS — J069 Acute upper respiratory infection, unspecified: Secondary | ICD-10-CM | POA: Insufficient documentation

## 2018-04-28 DIAGNOSIS — J40 Bronchitis, not specified as acute or chronic: Secondary | ICD-10-CM | POA: Diagnosis not present

## 2018-04-28 DIAGNOSIS — I10 Essential (primary) hypertension: Secondary | ICD-10-CM | POA: Insufficient documentation

## 2018-04-28 DIAGNOSIS — Z6832 Body mass index (BMI) 32.0-32.9, adult: Secondary | ICD-10-CM | POA: Diagnosis not present

## 2018-04-28 DIAGNOSIS — Z87891 Personal history of nicotine dependence: Secondary | ICD-10-CM | POA: Diagnosis not present

## 2018-04-28 DIAGNOSIS — F17210 Nicotine dependence, cigarettes, uncomplicated: Secondary | ICD-10-CM | POA: Diagnosis not present

## 2018-04-28 DIAGNOSIS — R05 Cough: Secondary | ICD-10-CM | POA: Diagnosis not present

## 2018-04-28 MED ORDER — DOXYCYCLINE HYCLATE 100 MG PO TABS
100.0000 mg | ORAL_TABLET | Freq: Once | ORAL | Status: AC
  Administered 2018-04-28: 100 mg via ORAL
  Filled 2018-04-28: qty 1

## 2018-04-28 MED ORDER — PREDNISONE 50 MG PO TABS: ORAL_TABLET | ORAL | 0 refills | Status: DC

## 2018-04-28 MED ORDER — DOXYCYCLINE HYCLATE 100 MG PO CAPS: 100.0000 mg | ORAL_CAPSULE | Freq: Two times a day (BID) | ORAL | 0 refills | Status: DC

## 2018-04-28 MED ORDER — PREDNISONE 50 MG PO TABS
60.0000 mg | ORAL_TABLET | Freq: Once | ORAL | Status: AC
  Administered 2018-04-28: 60 mg via ORAL
  Filled 2018-04-28: qty 1

## 2018-04-28 MED ORDER — BENZONATATE 100 MG PO CAPS: 100.0000 mg | ORAL_CAPSULE | Freq: Three times a day (TID) | ORAL | 0 refills | Status: DC

## 2018-04-28 MED ORDER — IPRATROPIUM-ALBUTEROL 0.5-2.5 (3) MG/3ML IN SOLN
3.0000 mL | Freq: Once | RESPIRATORY_TRACT | Status: AC
  Administered 2018-04-28: 3 mL via RESPIRATORY_TRACT
  Filled 2018-04-28: qty 3

## 2018-04-28 NOTE — ED Provider Notes (Signed)
[facility] EMERGENCY DEPARTMENT Provider Note   CSN: [id number] Arrival date & time: 04/28/18  0243     History   Chief Complaint Chief Complaint  Patient presents with  . Cough    HPI Douglas Mason is a 67 y.o. male.  Patient with history of recurrent bronchitis presenting with cough productive of yellow mucus for the past 1 week.  States this is worse when he tries to lie down.  Denies fevers, chills, nausea or vomiting.  No chest pain.  No leg pain or leg swelling.  Good p.o. intake and urine output.  Patient has been using his nebulizer machine at home without relief.  He denies any previous history of COPD or asthma so it is not clear why he has a nebulizer machine at home.  He is a previous smoker but has not smoked in many years.  He has been evaluated multiple times in the ED for this problem in the past.  The history is provided by the patient and a relative.    Past Medical History:  Diagnosis Date  . Bronchitis   . Diabetes mellitus without complication ([place])   . GERD (gastroesophageal reflux disease)   . High cholesterol   . Hypertension   . Pleurisy   . Pneumonia     Patient Active Problem List   Diagnosis Date Noted  . Dyspnea 08/04/2014  . OSA (obstructive sleep apnea) 05/02/2014  . Upper airway cough syndrome 03/16/2014  . CAP (community acquired pneumonia) 03/15/2014  . Atrial flutter ([name]) 03/15/2014  . Pericardial effusion by CT, not seen by echo 03/15/2014  . Chest pain with moderate risk of acute coronary syndrome 03/13/2014  . Essential hypertension 03/13/2014  . Diabetes ([place]) 03/13/2014  . GERD (gastroesophageal reflux disease) 03/13/2014    History reviewed. No pertinent surgical history.      Home Medications    Prior to Admission medications   Medication Sig Start Date End Date Taking? Authorizing Provider  albuterol (PROVENTIL HFA;VENTOLIN HFA) 108 (90 BASE) MCG/ACT inhaler Inhale 2 puffs into the lungs every 4 (four) hours as  needed for wheezing or shortness of breath. 06/28/14  Yes Virgel Manifold, MD  albuterol (PROVENTIL) (2.5 MG/3ML) 0.083% nebulizer solution Take 3 mLs (2.5 mg total) by nebulization every 6 (six) hours as needed for wheezing or shortness of breath. 08/18/14  Yes Parrett, Fonnie Mu, NP  ALPRAZolam (XANAX) 0.5 MG tablet Take 1 tablet by mouth at bedtime as needed. anxiety 01/12/14  Yes [provider]  aspirin EC 81 MG tablet Take 81 mg by mouth daily.   Yes [provider]  BYSTOLIC 5 MG tablet TAKE (1) TABLET BY MOUTH ONCE DAILY. 02/22/15  Yes Parrett, Tammy S, NP  chlorpheniramine (CHLORPHEN) 4 MG tablet Take 1 tablet (4 mg total) by mouth at bedtime as needed for allergies. 08/18/14  Yes Parrett, Fonnie Mu, NP  famotidine (PEPCID) 20 MG tablet One at bedtime 08/04/14  Yes Tanda Rockers, MD  GLIPIZIDE XL 5 MG 24 hr tablet Take 1 tablet by mouth daily. 12/23/14  Yes [provider]  Multiple Vitamins-Minerals (MULTIVITAMINS THER. W/MINERALS) TABS tablet Take 1 tablet by mouth daily.   Yes [provider]  pantoprazole (PROTONIX) 40 MG tablet Take 1 tablet (40 mg total) by mouth 2 (two) times daily before a meal. 03/17/14  Yes Kelby Aline, MD  pravastatin (PRAVACHOL) 40 MG tablet Take 1 tablet by mouth daily. 01/01/14  Yes [provider]  Family History Family History  Problem Relation Age of Onset  . Asthma Mother   . Leukemia Mother   . Rheum arthritis Mother   . Rheum arthritis Maternal Grandmother     Social History Social History   Tobacco Use  . Smoking status: Former Smoker    Packs/day: 2.00    Years: 30.00    Pack years: 60.00    Types: Cigarettes    Last attempt to quit: 09/02/1976    Years since quitting: 41.6  . Smokeless tobacco: Former Systems developer    Types: Chew    Quit date: 09/03/2003  Substance Use Topics  . Alcohol use: No  . Drug use: No     Allergies   Patient has no known allergies.   Review of Systems Review of Systems   Constitutional: Negative for activity change, appetite change and fever.  HENT: Positive for congestion and rhinorrhea. Negative for sore throat and trouble swallowing.   Respiratory: Positive for cough and shortness of breath. Negative for chest tightness.   Cardiovascular: Negative for chest pain and leg swelling.  Gastrointestinal: Negative for abdominal pain, diarrhea, nausea and vomiting.  Genitourinary: Negative for dysuria, hematuria, testicular pain and urgency.  Musculoskeletal: Negative for arthralgias and myalgias.  Skin: Negative for rash.  Neurological: Negative for dizziness, weakness, light-headedness and headaches.  Hematological: Negative for adenopathy.   all other systems are negative except as noted in the HPI and PMH.     Physical Exam Updated Vital Signs BP (!) 146/76 (BP Location: Left Arm)   Pulse 85   Temp 98.1 F (36.7 C) (Oral)   Resp 20   Ht 6' (1.829 m)   Wt 107.5 kg   SpO2 96%   BMI 32.14 kg/m   Physical Exam  Constitutional: He is oriented to person, place, and time. He appears well-developed and well-nourished. No distress.  Speaking full sentences, no distress  HENT:  Head: Normocephalic and atraumatic.  Mouth/Throat: Oropharynx is clear and moist. No oropharyngeal exudate.  Eyes: Pupils are equal, round, and reactive to light. Conjunctivae and EOM are normal.  Neck: Normal range of motion. Neck supple.  No meningismus.  Cardiovascular: Normal rate, regular rhythm, normal heart sounds and intact distal pulses.  No murmur heard. Pulmonary/Chest: Effort normal. No respiratory distress. He has wheezes.  Scattered expiratory wheezing bilaterally  Abdominal: Soft. There is no tenderness. There is no rebound and no guarding.  Musculoskeletal: Normal range of motion. He exhibits no edema or tenderness.  Neurological: He is alert and oriented to person, place, and time. No cranial nerve deficit. He exhibits normal muscle tone. Coordination normal.    No ataxia on finger to nose bilaterally. No pronator drift. 5/5 strength throughout. CN 2-12 intact.Equal grip strength. Sensation intact.   Skin: Skin is warm.  Psychiatric: He has a normal mood and affect. His behavior is normal.  Nursing note and vitals reviewed.    ED Treatments / Results  Labs (all labs ordered are listed, but only abnormal results are displayed) Labs Reviewed - No data to display  EKG None  Radiology Dg Chest 2 View  Result Date: 04/28/2018 CLINICAL DATA:  67 y/o  M; 1 week of productive cough. EXAM: CHEST - 2 VIEW COMPARISON:  07/22/2014 chest radiograph FINDINGS: Stable mildly enlarged cardiac silhouette given projection and technique. Linear platelike atelectasis in the right lung base. No focal consolidation. No pleural effusion or pneumothorax. No pleural effusion or pneumothorax. No acute osseous abnormality is evident. IMPRESSION: Right lung base  platelike atelectasis. Stable enlarged cardiac silhouette. Electronically Signed   By: Kristine Garbe M.D.   On: 04/28/2018 03:43    Procedures Procedures (including critical care time)  Medications Ordered in ED Medications - No data to display   Initial Impression / Assessment and Plan / ED Course  I have reviewed the triage vital signs and the nursing notes.  Pertinent labs & imaging results that were available during my care of the patient were reviewed by me and considered in my medical decision making (see chart for details).    Patient with recurrent cough.  Denies any chest pain, fever no leg pain or leg swelling.  He is not hypoxic, no distress.  Minimal wheezing on exam.  Given nebulizers and steroids.  Chest x-ray shows no infiltrate.  On reassessment, patient's breath sounds are clear.  He is amatory without desaturation.  He is anxious to leave.  He denies any chest pain or leg swelling.  Previous pulmonology notes from Dr. Melvyn Novas reviewed.  Dr. Melvyn Novas states there is no evidence of  COPD or asthma.  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  We will treat supportively for likely bronchitis with bronchodilators and steroids.  Follow-up with his PCP as well as pulmonologist.  Return precautions discussed.  Final Clinical Impressions(s) / ED Diagnoses   Final diagnoses:  Viral URI with cough  Bronchitis    ED Discharge Orders         Ordered    doxycycline (VIBRAMYCIN) 100 MG capsule  2 times daily     04/28/18 0440    predniSONE (DELTASONE) 50 MG tablet     04/28/18 0440    benzonatate (TESSALON) 100 MG capsule  Every 8 hours     04/28/18 0440           Ezequiel Essex, MD 04/28/18 [phone]

## 2018-04-28 NOTE — ED Notes (Signed)
Patient O2 sats while ambulating were 93 percent with pulse rate at 109.

## 2018-04-28 NOTE — ED Triage Notes (Signed)
Pt states he has had a Productive cough for about a week. States sputum is white.

## 2018-04-28 NOTE — Discharge Instructions (Addendum)
Take the steroids and antibiotics as prescribed.  Use caution while using the steroids because these could make your blood sugars increase and he should monitor them carefully.  Follow-up with your primary doctor and lung doctor.  Return to the ED if you develop new or worsening symptoms.

## 2018-05-15 DIAGNOSIS — E782 Mixed hyperlipidemia: Secondary | ICD-10-CM | POA: Diagnosis not present

## 2018-05-15 DIAGNOSIS — K219 Gastro-esophageal reflux disease without esophagitis: Secondary | ICD-10-CM | POA: Diagnosis not present

## 2018-05-15 DIAGNOSIS — I10 Essential (primary) hypertension: Secondary | ICD-10-CM | POA: Diagnosis not present

## 2018-05-15 DIAGNOSIS — E1165 Type 2 diabetes mellitus with hyperglycemia: Secondary | ICD-10-CM | POA: Diagnosis not present

## 2018-05-15 DIAGNOSIS — F411 Generalized anxiety disorder: Secondary | ICD-10-CM | POA: Diagnosis not present

## 2018-05-20 DIAGNOSIS — Z23 Encounter for immunization: Secondary | ICD-10-CM | POA: Diagnosis not present

## 2018-05-20 DIAGNOSIS — J060 Acute laryngopharyngitis: Secondary | ICD-10-CM | POA: Diagnosis not present

## 2018-05-20 DIAGNOSIS — E1165 Type 2 diabetes mellitus with hyperglycemia: Secondary | ICD-10-CM | POA: Diagnosis not present

## 2018-05-20 DIAGNOSIS — I10 Essential (primary) hypertension: Secondary | ICD-10-CM | POA: Diagnosis not present

## 2018-05-20 DIAGNOSIS — Z6833 Body mass index (BMI) 33.0-33.9, adult: Secondary | ICD-10-CM | POA: Diagnosis not present

## 2018-05-20 DIAGNOSIS — F411 Generalized anxiety disorder: Secondary | ICD-10-CM | POA: Diagnosis not present

## 2018-05-20 DIAGNOSIS — K219 Gastro-esophageal reflux disease without esophagitis: Secondary | ICD-10-CM | POA: Diagnosis not present

## 2018-05-20 DIAGNOSIS — E782 Mixed hyperlipidemia: Secondary | ICD-10-CM | POA: Diagnosis not present

## 2018-06-05 DIAGNOSIS — I10 Essential (primary) hypertension: Secondary | ICD-10-CM | POA: Diagnosis not present

## 2018-06-05 DIAGNOSIS — R251 Tremor, unspecified: Secondary | ICD-10-CM | POA: Diagnosis not present

## 2018-06-05 DIAGNOSIS — Z6830 Body mass index (BMI) 30.0-30.9, adult: Secondary | ICD-10-CM | POA: Diagnosis not present

## 2018-07-23 ENCOUNTER — Other Ambulatory Visit: Payer: Self-pay

## 2018-08-25 ENCOUNTER — Encounter: Payer: Self-pay | Admitting: Gastroenterology

## 2018-09-04 DIAGNOSIS — R945 Abnormal results of liver function studies: Secondary | ICD-10-CM | POA: Diagnosis not present

## 2018-09-04 DIAGNOSIS — E1165 Type 2 diabetes mellitus with hyperglycemia: Secondary | ICD-10-CM | POA: Diagnosis not present

## 2018-09-04 DIAGNOSIS — E119 Type 2 diabetes mellitus without complications: Secondary | ICD-10-CM | POA: Diagnosis not present

## 2018-09-04 DIAGNOSIS — E782 Mixed hyperlipidemia: Secondary | ICD-10-CM | POA: Diagnosis not present

## 2018-09-04 DIAGNOSIS — I10 Essential (primary) hypertension: Secondary | ICD-10-CM | POA: Diagnosis not present

## 2018-09-07 DIAGNOSIS — Z809 Family history of malignant neoplasm, unspecified: Secondary | ICD-10-CM | POA: Diagnosis not present

## 2018-09-07 DIAGNOSIS — K219 Gastro-esophageal reflux disease without esophagitis: Secondary | ICD-10-CM | POA: Diagnosis not present

## 2018-09-07 DIAGNOSIS — Z23 Encounter for immunization: Secondary | ICD-10-CM | POA: Diagnosis not present

## 2018-09-07 DIAGNOSIS — I10 Essential (primary) hypertension: Secondary | ICD-10-CM | POA: Diagnosis not present

## 2018-09-07 DIAGNOSIS — F411 Generalized anxiety disorder: Secondary | ICD-10-CM | POA: Diagnosis not present

## 2018-09-07 DIAGNOSIS — E1165 Type 2 diabetes mellitus with hyperglycemia: Secondary | ICD-10-CM | POA: Diagnosis not present

## 2018-09-07 DIAGNOSIS — E782 Mixed hyperlipidemia: Secondary | ICD-10-CM | POA: Diagnosis not present

## 2018-12-07 DIAGNOSIS — S29011A Strain of muscle and tendon of front wall of thorax, initial encounter: Secondary | ICD-10-CM | POA: Diagnosis not present

## 2019-01-12 ENCOUNTER — Encounter (HOSPITAL_COMMUNITY): Payer: Self-pay | Admitting: Emergency Medicine

## 2019-01-12 ENCOUNTER — Other Ambulatory Visit: Payer: Self-pay

## 2019-01-12 ENCOUNTER — Emergency Department (HOSPITAL_COMMUNITY): Payer: Medicare Other

## 2019-01-12 ENCOUNTER — Observation Stay (HOSPITAL_COMMUNITY)
Admission: EM | Admit: 2019-01-12 | Discharge: 2019-01-14 | Disposition: A | Discharge status: Home / Self Care | Payer: Medicare Other | Attending: General Surgery | Admitting: General Surgery

## 2019-01-12 DIAGNOSIS — G4733 Obstructive sleep apnea (adult) (pediatric): Secondary | ICD-10-CM | POA: Insufficient documentation

## 2019-01-12 DIAGNOSIS — Z7952 Long term (current) use of systemic steroids: Secondary | ICD-10-CM | POA: Insufficient documentation

## 2019-01-12 DIAGNOSIS — Z7982 Long term (current) use of aspirin: Secondary | ICD-10-CM | POA: Insufficient documentation

## 2019-01-12 DIAGNOSIS — Z1159 Encounter for screening for other viral diseases: Secondary | ICD-10-CM | POA: Insufficient documentation

## 2019-01-12 DIAGNOSIS — R1011 Right upper quadrant pain: Secondary | ICD-10-CM | POA: Diagnosis not present

## 2019-01-12 DIAGNOSIS — Z79899 Other long term (current) drug therapy: Secondary | ICD-10-CM | POA: Insufficient documentation

## 2019-01-12 DIAGNOSIS — R61 Generalized hyperhidrosis: Secondary | ICD-10-CM | POA: Diagnosis not present

## 2019-01-12 DIAGNOSIS — I4892 Unspecified atrial flutter: Secondary | ICD-10-CM | POA: Diagnosis not present

## 2019-01-12 DIAGNOSIS — E7800 Pure hypercholesterolemia, unspecified: Secondary | ICD-10-CM | POA: Insufficient documentation

## 2019-01-12 DIAGNOSIS — Z7984 Long term (current) use of oral hypoglycemic drugs: Secondary | ICD-10-CM | POA: Insufficient documentation

## 2019-01-12 DIAGNOSIS — K8012 Calculus of gallbladder with acute and chronic cholecystitis without obstruction: Principal | ICD-10-CM | POA: Insufficient documentation

## 2019-01-12 DIAGNOSIS — I10 Essential (primary) hypertension: Secondary | ICD-10-CM | POA: Diagnosis not present

## 2019-01-12 DIAGNOSIS — Z20828 Contact with and (suspected) exposure to other viral communicable diseases: Secondary | ICD-10-CM | POA: Diagnosis not present

## 2019-01-12 DIAGNOSIS — K219 Gastro-esophageal reflux disease without esophagitis: Secondary | ICD-10-CM | POA: Insufficient documentation

## 2019-01-12 DIAGNOSIS — E119 Type 2 diabetes mellitus without complications: Secondary | ICD-10-CM

## 2019-01-12 DIAGNOSIS — F419 Anxiety disorder, unspecified: Secondary | ICD-10-CM | POA: Diagnosis present

## 2019-01-12 DIAGNOSIS — R0602 Shortness of breath: Secondary | ICD-10-CM | POA: Diagnosis not present

## 2019-01-12 DIAGNOSIS — R079 Chest pain, unspecified: Secondary | ICD-10-CM | POA: Diagnosis not present

## 2019-01-12 DIAGNOSIS — K8000 Calculus of gallbladder with acute cholecystitis without obstruction: Secondary | ICD-10-CM

## 2019-01-12 DIAGNOSIS — Z87891 Personal history of nicotine dependence: Secondary | ICD-10-CM | POA: Insufficient documentation

## 2019-01-12 DIAGNOSIS — K8020 Calculus of gallbladder without cholecystitis without obstruction: Secondary | ICD-10-CM | POA: Diagnosis not present

## 2019-01-12 DIAGNOSIS — K810 Acute cholecystitis: Secondary | ICD-10-CM | POA: Diagnosis present

## 2019-01-12 HISTORY — DX: Sleep apnea, unspecified: G47.30

## 2019-01-12 LAB — CBC
HCT: 38 % — ABNORMAL LOW (ref 39.0–52.0)
Hemoglobin: 12.9 g/dL — ABNORMAL LOW (ref 13.0–17.0)
MCH: 31.9 pg (ref 26.0–34.0)
MCHC: 33.9 g/dL (ref 30.0–36.0)
MCV: 93.8 fL (ref 80.0–100.0)
Platelets: 311 10*3/uL (ref 150–400)
RBC: 4.05 MIL/uL — ABNORMAL LOW (ref 4.22–5.81)
RDW: 14.6 % (ref 11.5–15.5)
WBC: 12.5 10*3/uL — ABNORMAL HIGH (ref 4.0–10.5)
nRBC: 0 % (ref 0.0–0.2)

## 2019-01-12 MED ORDER — SODIUM CHLORIDE 0.9% FLUSH
3.0000 mL | Freq: Once | INTRAVENOUS | Status: AC
  Administered 2019-01-13: 04:00:00 3 mL via INTRAVENOUS

## 2019-01-12 NOTE — ED Triage Notes (Signed)
Pt c/o right sided flank pain that wraps around to right chest since 2000 tonight, SOB, pt reports nausea and vomiting, pt diaphoretic during triage

## 2019-01-13 ENCOUNTER — Emergency Department (HOSPITAL_COMMUNITY): Payer: Medicare Other

## 2019-01-13 ENCOUNTER — Encounter (HOSPITAL_COMMUNITY)
Admission: EM | Disposition: A | Discharge status: Home / Self Care | Payer: Self-pay | Source: Home / Self Care | Attending: Emergency Medicine

## 2019-01-13 ENCOUNTER — Observation Stay (HOSPITAL_COMMUNITY): Payer: Medicare Other | Admitting: Anesthesiology

## 2019-01-13 ENCOUNTER — Encounter (HOSPITAL_COMMUNITY): Payer: Self-pay

## 2019-01-13 ENCOUNTER — Other Ambulatory Visit: Payer: Self-pay

## 2019-01-13 DIAGNOSIS — I10 Essential (primary) hypertension: Secondary | ICD-10-CM | POA: Diagnosis not present

## 2019-01-13 DIAGNOSIS — R079 Chest pain, unspecified: Secondary | ICD-10-CM | POA: Diagnosis not present

## 2019-01-13 DIAGNOSIS — R0602 Shortness of breath: Secondary | ICD-10-CM | POA: Diagnosis not present

## 2019-01-13 DIAGNOSIS — F419 Anxiety disorder, unspecified: Secondary | ICD-10-CM | POA: Diagnosis not present

## 2019-01-13 DIAGNOSIS — K8012 Calculus of gallbladder with acute and chronic cholecystitis without obstruction: Secondary | ICD-10-CM | POA: Diagnosis not present

## 2019-01-13 DIAGNOSIS — K810 Acute cholecystitis: Secondary | ICD-10-CM | POA: Diagnosis not present

## 2019-01-13 DIAGNOSIS — E119 Type 2 diabetes mellitus without complications: Secondary | ICD-10-CM | POA: Diagnosis not present

## 2019-01-13 DIAGNOSIS — Z1159 Encounter for screening for other viral diseases: Secondary | ICD-10-CM | POA: Diagnosis not present

## 2019-01-13 DIAGNOSIS — G4733 Obstructive sleep apnea (adult) (pediatric): Secondary | ICD-10-CM | POA: Diagnosis not present

## 2019-01-13 DIAGNOSIS — Z7982 Long term (current) use of aspirin: Secondary | ICD-10-CM | POA: Diagnosis not present

## 2019-01-13 DIAGNOSIS — I4892 Unspecified atrial flutter: Secondary | ICD-10-CM | POA: Diagnosis not present

## 2019-01-13 DIAGNOSIS — K219 Gastro-esophageal reflux disease without esophagitis: Secondary | ICD-10-CM | POA: Diagnosis not present

## 2019-01-13 DIAGNOSIS — R61 Generalized hyperhidrosis: Secondary | ICD-10-CM | POA: Diagnosis not present

## 2019-01-13 DIAGNOSIS — Z7952 Long term (current) use of systemic steroids: Secondary | ICD-10-CM | POA: Diagnosis not present

## 2019-01-13 DIAGNOSIS — G4730 Sleep apnea, unspecified: Secondary | ICD-10-CM | POA: Diagnosis not present

## 2019-01-13 DIAGNOSIS — Z7984 Long term (current) use of oral hypoglycemic drugs: Secondary | ICD-10-CM | POA: Diagnosis not present

## 2019-01-13 DIAGNOSIS — E7800 Pure hypercholesterolemia, unspecified: Secondary | ICD-10-CM | POA: Diagnosis not present

## 2019-01-13 DIAGNOSIS — K8020 Calculus of gallbladder without cholecystitis without obstruction: Secondary | ICD-10-CM | POA: Diagnosis not present

## 2019-01-13 DIAGNOSIS — K8000 Calculus of gallbladder with acute cholecystitis without obstruction: Secondary | ICD-10-CM

## 2019-01-13 HISTORY — PX: CHOLECYSTECTOMY: SHX55

## 2019-01-13 LAB — CBC WITH DIFFERENTIAL/PLATELET
Abs Immature Granulocytes: 0.06 10*3/uL (ref 0.00–0.07)
Basophils Absolute: 0 10*3/uL (ref 0.0–0.1)
Basophils Relative: 0 %
Eosinophils Absolute: 0 10*3/uL (ref 0.0–0.5)
Eosinophils Relative: 0 %
HCT: 41.5 % (ref 39.0–52.0)
Hemoglobin: 13.5 g/dL (ref 13.0–17.0)
Immature Granulocytes: 0 %
Lymphocytes Relative: 6 %
Lymphs Abs: 0.9 10*3/uL (ref 0.7–4.0)
MCH: 31.5 pg (ref 26.0–34.0)
MCHC: 32.5 g/dL (ref 30.0–36.0)
MCV: 96.7 fL (ref 80.0–100.0)
Monocytes Absolute: 0.7 10*3/uL (ref 0.1–1.0)
Monocytes Relative: 5 %
Neutro Abs: 13.5 10*3/uL — ABNORMAL HIGH (ref 1.7–7.7)
Neutrophils Relative %: 89 %
Platelets: 314 10*3/uL (ref 150–400)
RBC: 4.29 MIL/uL (ref 4.22–5.81)
RDW: 14.6 % (ref 11.5–15.5)
WBC: 15.3 10*3/uL — ABNORMAL HIGH (ref 4.0–10.5)
nRBC: 0 % (ref 0.0–0.2)

## 2019-01-13 LAB — URINALYSIS, ROUTINE W REFLEX MICROSCOPIC
Bacteria, UA: NONE SEEN
Bilirubin Urine: NEGATIVE
Glucose, UA: >=500 mg/dL — AB
Hgb urine dipstick: NEGATIVE
Ketones, ur: NEGATIVE mg/dL
Leukocytes,Ua: NEGATIVE
Nitrite: NEGATIVE
Protein, ur: NEGATIVE mg/dL
Specific Gravity, Urine: >1.046 — ABNORMAL HIGH (ref 1.005–1.030)
pH: 5 (ref 5.0–8.0)

## 2019-01-13 LAB — COMPREHENSIVE METABOLIC PANEL
ALT: 42 U/L (ref 0–44)
AST: 36 U/L (ref 15–41)
Albumin: 4.7 g/dL (ref 3.5–5.0)
Alkaline Phosphatase: 75 U/L (ref 38–126)
Anion gap: 14 (ref 5–15)
BUN: 15 mg/dL (ref 8–23)
CO2: 22 mmol/L (ref 22–32)
Calcium: 9.2 mg/dL (ref 8.9–10.3)
Chloride: 104 mmol/L (ref 98–111)
Creatinine, Ser: 0.87 mg/dL (ref 0.61–1.24)
GFR calc Af Amer: >60 mL/min (ref >60)
GFR calc non Af Amer: >60 mL/min (ref >60)
Glucose, Bld: 235 mg/dL — ABNORMAL HIGH (ref 70–99)
Potassium: 4.6 mmol/L (ref 3.5–5.1)
Sodium: 140 mmol/L (ref 135–145)
Total Bilirubin: 1.4 mg/dL — ABNORMAL HIGH (ref 0.3–1.2)
Total Protein: 8.6 g/dL — ABNORMAL HIGH (ref 6.5–8.1)

## 2019-01-13 LAB — GLUCOSE, CAPILLARY
Glucose-Capillary: 219 mg/dL — ABNORMAL HIGH (ref 70–99)
Glucose-Capillary: 221 mg/dL — ABNORMAL HIGH (ref 70–99)
Glucose-Capillary: 228 mg/dL — ABNORMAL HIGH (ref 70–99)
Glucose-Capillary: 386 mg/dL — ABNORMAL HIGH (ref 70–99)
Glucose-Capillary: 353 mg/dL — ABNORMAL HIGH (ref 70–99)
Glucose-Capillary: 239 mg/dL — ABNORMAL HIGH (ref 70–99)

## 2019-01-13 LAB — TROPONIN I: Troponin I: <0.03 ng/mL (ref <0.03)

## 2019-01-13 LAB — BASIC METABOLIC PANEL
Anion gap: 14 (ref 5–15)
BUN: 17 mg/dL (ref 8–23)
CO2: 22 mmol/L (ref 22–32)
Calcium: 8.8 mg/dL — ABNORMAL LOW (ref 8.9–10.3)
Chloride: 104 mmol/L (ref 98–111)
Creatinine, Ser: 0.98 mg/dL (ref 0.61–1.24)
GFR calc Af Amer: >60 mL/min (ref >60)
GFR calc non Af Amer: >60 mL/min (ref >60)
Glucose, Bld: 253 mg/dL — ABNORMAL HIGH (ref 70–99)
Potassium: 4.2 mmol/L (ref 3.5–5.1)
Sodium: 140 mmol/L (ref 135–145)

## 2019-01-13 LAB — HEPATIC FUNCTION PANEL
ALT: 35 U/L (ref 0–44)
AST: 29 U/L (ref 15–41)
Albumin: 4.4 g/dL (ref 3.5–5.0)
Alkaline Phosphatase: 71 U/L (ref 38–126)
Bilirubin, Direct: 0.2 mg/dL (ref 0.0–0.2)
Indirect Bilirubin: 0.7 mg/dL (ref 0.3–0.9)
Total Bilirubin: 0.9 mg/dL (ref 0.3–1.2)
Total Protein: 7.8 g/dL (ref 6.5–8.1)

## 2019-01-13 LAB — SURGICAL PCR SCREEN
MRSA, PCR: NEGATIVE
Staphylococcus aureus: NEGATIVE

## 2019-01-13 LAB — LIPASE, BLOOD
Lipase: 30 U/L (ref 11–51)
Lipase: 48 U/L (ref 11–51)

## 2019-01-13 LAB — SARS CORONAVIRUS 2 BY RT PCR (HOSPITAL ORDER, PERFORMED IN ~~LOC~~ HOSPITAL LAB): SARS Coronavirus 2: NEGATIVE

## 2019-01-13 SURGERY — LAPAROSCOPIC CHOLECYSTECTOMY
Anesthesia: General | Site: Abdomen

## 2019-01-13 MED ORDER — ARTIFICIAL TEARS OPHTHALMIC OINT
TOPICAL_OINTMENT | OPHTHALMIC | Status: AC
  Filled 2019-01-13: qty 3.5

## 2019-01-13 MED ORDER — FENTANYL CITRATE (PF) 100 MCG/2ML IJ SOLN
INTRAMUSCULAR | Status: DC | PRN
  Administered 2019-01-13 (×4): 50 ug via INTRAVENOUS

## 2019-01-13 MED ORDER — CHLORHEXIDINE GLUCONATE CLOTH 2 % EX PADS: 6.0000 | MEDICATED_PAD | Freq: Once | CUTANEOUS | Status: DC

## 2019-01-13 MED ORDER — MEPERIDINE HCL 50 MG/ML IJ SOLN: 6.2500 mg | INTRAMUSCULAR | Status: DC | PRN

## 2019-01-13 MED ORDER — HEMOSTATIC AGENTS (NO CHARGE) OPTIME
TOPICAL | Status: DC | PRN
  Administered 2019-01-13: 1 via TOPICAL
  Administered 2019-01-13: 2 via TOPICAL

## 2019-01-13 MED ORDER — ALBUTEROL SULFATE HFA 108 (90 BASE) MCG/ACT IN AERS
2.0000 | INHALATION_SPRAY | RESPIRATORY_TRACT | Status: DC | PRN
  Filled 2019-01-13: qty 6.7

## 2019-01-13 MED ORDER — ONDANSETRON HCL 4 MG/2ML IJ SOLN
INTRAMUSCULAR | Status: AC
  Filled 2019-01-13: qty 2

## 2019-01-13 MED ORDER — BUPIVACAINE HCL (PF) 0.5 % IJ SOLN
INTRAMUSCULAR | Status: DC | PRN
  Administered 2019-01-13: 30 mL

## 2019-01-13 MED ORDER — BUPIVACAINE HCL (PF) 0.5 % IJ SOLN
INTRAMUSCULAR | Status: AC
  Filled 2019-01-13: qty 30

## 2019-01-13 MED ORDER — SODIUM CHLORIDE 0.45 % IV SOLN
INTRAVENOUS | Status: AC
  Administered 2019-01-13: 04:00:00 via INTRAVENOUS

## 2019-01-13 MED ORDER — SODIUM CHLORIDE 0.9 % IV SOLN: 1.0000 g | INTRAVENOUS | Status: DC

## 2019-01-13 MED ORDER — FENTANYL CITRATE (PF) 100 MCG/2ML IJ SOLN
100.0000 ug | Freq: Once | INTRAMUSCULAR | Status: AC
  Administered 2019-01-13: 100 ug via INTRAVENOUS
  Filled 2019-01-13: qty 2

## 2019-01-13 MED ORDER — ACETAMINOPHEN 325 MG PO TABS: 650.0000 mg | ORAL_TABLET | Freq: Four times a day (QID) | ORAL | Status: DC | PRN

## 2019-01-13 MED ORDER — METRONIDAZOLE IN NACL 5-0.79 MG/ML-% IV SOLN
500.0000 mg | Freq: Three times a day (TID) | INTRAVENOUS | Status: DC
  Administered 2019-01-13: 05:00:00 500 mg via INTRAVENOUS
  Filled 2019-01-13: qty 100

## 2019-01-13 MED ORDER — PROPOFOL 10 MG/ML IV BOLUS
INTRAVENOUS | Status: AC
  Filled 2019-01-13: qty 40

## 2019-01-13 MED ORDER — ONDANSETRON HCL 4 MG PO TABS: 4.0000 mg | ORAL_TABLET | Freq: Four times a day (QID) | ORAL | 0 refills | Status: DC | PRN

## 2019-01-13 MED ORDER — DOCUSATE SODIUM 100 MG PO CAPS
100.0000 mg | ORAL_CAPSULE | Freq: Two times a day (BID) | ORAL | Status: DC
  Administered 2019-01-13 – 2019-01-14 (×3): 100 mg via ORAL
  Filled 2019-01-13 (×6): qty 1

## 2019-01-13 MED ORDER — SODIUM CHLORIDE 0.9 % IV SOLN
2.0000 g | INTRAVENOUS | Status: AC
  Administered 2019-01-13: 2 g via INTRAVENOUS
  Filled 2019-01-13: qty 2

## 2019-01-13 MED ORDER — SODIUM CHLORIDE 0.9 % IV SOLN
1.0000 g | Freq: Once | INTRAVENOUS | Status: AC
  Administered 2019-01-13: 02:00:00 1 g via INTRAVENOUS
  Filled 2019-01-13: qty 10

## 2019-01-13 MED ORDER — PROPOFOL 10 MG/ML IV BOLUS
INTRAVENOUS | Status: DC | PRN
  Administered 2019-01-13: 180 mg via INTRAVENOUS

## 2019-01-13 MED ORDER — ONDANSETRON HCL 4 MG/2ML IJ SOLN
INTRAMUSCULAR | Status: DC | PRN
  Administered 2019-01-13: 4 mg via INTRAVENOUS

## 2019-01-13 MED ORDER — LIDOCAINE 2% (20 MG/ML) 5 ML SYRINGE
INTRAMUSCULAR | Status: AC
  Filled 2019-01-13: qty 5

## 2019-01-13 MED ORDER — ONDANSETRON HCL 4 MG PO TABS: 4.0000 mg | ORAL_TABLET | Freq: Four times a day (QID) | ORAL | Status: DC | PRN

## 2019-01-13 MED ORDER — ACETAMINOPHEN 650 MG RE SUPP: 650.0000 mg | Freq: Four times a day (QID) | RECTAL | Status: DC | PRN

## 2019-01-13 MED ORDER — LACTATED RINGERS IV SOLN
INTRAVENOUS | Status: DC
  Administered 2019-01-13 (×2): via INTRAVENOUS

## 2019-01-13 MED ORDER — FENTANYL CITRATE (PF) 250 MCG/5ML IJ SOLN
INTRAMUSCULAR | Status: AC
  Filled 2019-01-13: qty 5

## 2019-01-13 MED ORDER — ROCURONIUM BROMIDE 50 MG/5ML IV SOSY
PREFILLED_SYRINGE | INTRAVENOUS | Status: DC | PRN
  Administered 2019-01-13: 30 mg via INTRAVENOUS

## 2019-01-13 MED ORDER — DEXAMETHASONE SODIUM PHOSPHATE 10 MG/ML IJ SOLN
INTRAMUSCULAR | Status: AC
  Filled 2019-01-13: qty 1

## 2019-01-13 MED ORDER — ONDANSETRON HCL 4 MG/2ML IJ SOLN
4.0000 mg | Freq: Once | INTRAMUSCULAR | Status: AC
  Administered 2019-01-13: 4 mg via INTRAVENOUS
  Filled 2019-01-13: qty 2

## 2019-01-13 MED ORDER — SODIUM CHLORIDE 0.9 % IV SOLN
1.0000 g | Freq: Once | INTRAVENOUS | Status: AC
  Administered 2019-01-13: 06:00:00 1 g via INTRAVENOUS
  Filled 2019-01-13: qty 10

## 2019-01-13 MED ORDER — IOHEXOL 350 MG/ML SOLN
100.0000 mL | Freq: Once | INTRAVENOUS | Status: AC | PRN
  Administered 2019-01-13: 01:00:00 100 mL via INTRAVENOUS

## 2019-01-13 MED ORDER — ALBUTEROL SULFATE (2.5 MG/3ML) 0.083% IN NEBU: 2.5000 mg | INHALATION_SOLUTION | RESPIRATORY_TRACT | Status: DC | PRN

## 2019-01-13 MED ORDER — HYDRALAZINE HCL 20 MG/ML IJ SOLN: 5.0000 mg | INTRAMUSCULAR | Status: DC | PRN

## 2019-01-13 MED ORDER — SODIUM CHLORIDE 0.9 % IR SOLN
Status: DC | PRN
  Administered 2019-01-13: 1000 mL

## 2019-01-13 MED ORDER — GLYCOPYRROLATE PF 0.2 MG/ML IJ SOSY
PREFILLED_SYRINGE | INTRAMUSCULAR | Status: DC | PRN
  Administered 2019-01-13: .2 mg via INTRAVENOUS

## 2019-01-13 MED ORDER — SUGAMMADEX SODIUM 500 MG/5ML IV SOLN
INTRAVENOUS | Status: DC | PRN
  Administered 2019-01-13: 211.6 mg via INTRAVENOUS

## 2019-01-13 MED ORDER — LACTATED RINGERS IV BOLUS
1000.0000 mL | Freq: Once | INTRAVENOUS | Status: AC
  Administered 2019-01-13: 1000 mL via INTRAVENOUS

## 2019-01-13 MED ORDER — HYDROMORPHONE HCL 1 MG/ML IJ SOLN: 0.2500 mg | INTRAMUSCULAR | Status: DC | PRN

## 2019-01-13 MED ORDER — OXYCODONE HCL 5 MG PO TABS: 5.0000 mg | ORAL_TABLET | ORAL | 0 refills | Status: DC | PRN

## 2019-01-13 MED ORDER — FENTANYL CITRATE (PF) 100 MCG/2ML IJ SOLN
50.0000 ug | INTRAMUSCULAR | Status: DC | PRN
  Administered 2019-01-13 (×2): 100 ug via INTRAVENOUS
  Filled 2019-01-13 (×2): qty 2

## 2019-01-13 MED ORDER — LIDOCAINE 2% (20 MG/ML) 5 ML SYRINGE
INTRAMUSCULAR | Status: DC | PRN
  Administered 2019-01-13: 40 mg via INTRAVENOUS

## 2019-01-13 MED ORDER — SODIUM CHLORIDE 0.9 % IV SOLN: 2.0000 g | INTRAVENOUS | Status: DC

## 2019-01-13 MED ORDER — LACTATED RINGERS IV BOLUS
1000.0000 mL | Freq: Once | INTRAVENOUS | Status: AC
  Administered 2019-01-13: 02:00:00 1000 mL via INTRAVENOUS

## 2019-01-13 MED ORDER — ONDANSETRON HCL 4 MG/2ML IJ SOLN: 4.0000 mg | Freq: Once | INTRAMUSCULAR | Status: DC | PRN

## 2019-01-13 MED ORDER — INSULIN ASPART 100 UNIT/ML ~~LOC~~ SOLN
0.0000 [IU] | SUBCUTANEOUS | Status: DC
  Administered 2019-01-13: 20:00:00 9 [IU] via SUBCUTANEOUS
  Administered 2019-01-13 (×2): 3 [IU] via SUBCUTANEOUS
  Administered 2019-01-13: 16:00:00 9 [IU] via SUBCUTANEOUS
  Administered 2019-01-14: 05:00:00 2 [IU] via SUBCUTANEOUS

## 2019-01-13 MED ORDER — SUCCINYLCHOLINE CHLORIDE 20 MG/ML IJ SOLN
INTRAMUSCULAR | Status: DC | PRN
  Administered 2019-01-13: 160 mg via INTRAVENOUS

## 2019-01-13 MED ORDER — OXYCODONE HCL 5 MG PO TABS
5.0000 mg | ORAL_TABLET | ORAL | Status: DC | PRN
  Administered 2019-01-13: 16:00:00 5 mg via ORAL
  Filled 2019-01-13 (×2): qty 1

## 2019-01-13 MED ORDER — KETOROLAC TROMETHAMINE 30 MG/ML IJ SOLN: 30.0000 mg | Freq: Once | INTRAMUSCULAR | Status: DC | PRN

## 2019-01-13 MED ORDER — SUGAMMADEX SODIUM 500 MG/5ML IV SOLN
INTRAVENOUS | Status: AC
  Filled 2019-01-13: qty 5

## 2019-01-13 MED ORDER — FAMOTIDINE IN NACL 20-0.9 MG/50ML-% IV SOLN
20.0000 mg | Freq: Two times a day (BID) | INTRAVENOUS | Status: DC
  Administered 2019-01-13 (×2): 20 mg via INTRAVENOUS
  Filled 2019-01-13 (×2): qty 50

## 2019-01-13 MED ORDER — DEXAMETHASONE SODIUM PHOSPHATE 4 MG/ML IJ SOLN
INTRAMUSCULAR | Status: DC | PRN
  Administered 2019-01-13: 8 mg via INTRAVENOUS

## 2019-01-13 MED ORDER — SUCCINYLCHOLINE CHLORIDE 200 MG/10ML IV SOSY
PREFILLED_SYRINGE | INTRAVENOUS | Status: AC
  Filled 2019-01-13: qty 10

## 2019-01-13 MED ORDER — ROCURONIUM BROMIDE 10 MG/ML (PF) SYRINGE
PREFILLED_SYRINGE | INTRAVENOUS | Status: AC
  Filled 2019-01-13: qty 10

## 2019-01-13 MED ORDER — MORPHINE SULFATE (PF) 2 MG/ML IV SOLN
2.0000 mg | INTRAVENOUS | Status: DC | PRN
  Administered 2019-01-13 (×3): 2 mg via INTRAVENOUS
  Filled 2019-01-13 (×3): qty 1

## 2019-01-13 MED ORDER — HYDROCODONE-ACETAMINOPHEN 7.5-325 MG PO TABS: 1.0000 | ORAL_TABLET | Freq: Once | ORAL | Status: DC | PRN

## 2019-01-13 MED ORDER — ONDANSETRON HCL 4 MG/2ML IJ SOLN
4.0000 mg | Freq: Four times a day (QID) | INTRAMUSCULAR | Status: DC | PRN
  Administered 2019-01-13: 4 mg via INTRAVENOUS

## 2019-01-13 MED ORDER — DOCUSATE SODIUM 100 MG PO CAPS: 100.0000 mg | ORAL_CAPSULE | Freq: Two times a day (BID) | ORAL | 0 refills | Status: DC

## 2019-01-13 MED ORDER — SEVOFLURANE IN SOLN
RESPIRATORY_TRACT | Status: AC
  Filled 2019-01-13: qty 250

## 2019-01-13 MED ORDER — HYDROMORPHONE HCL 1 MG/ML IJ SOLN
1.0000 mg | Freq: Once | INTRAMUSCULAR | Status: AC
  Administered 2019-01-13: 01:00:00 1 mg via INTRAVENOUS
  Filled 2019-01-13: qty 1

## 2019-01-13 SURGICAL SUPPLY — 55 items
ADH SKN CLS APL DERMABOND .7 (GAUZE/BANDAGES/DRESSINGS) ×1
APL PRP STRL LF DISP 70% ISPRP (MISCELLANEOUS) ×1
APL SRG 38 LTWT LNG FL B (MISCELLANEOUS) ×1
APPLICATOR ARISTA FLEXITIP XL (MISCELLANEOUS) ×2 IMPLANT
APPLIER CLIP ROT 10 11.4 M/L (STAPLE) ×3
APR CLP MED LRG 11.4X10 (STAPLE) ×1
BAG RETRIEVAL 10 (BASKET) ×1
BAG RETRIEVAL 10MM (BASKET) ×1
BLADE SURG 15 STRL LF DISP TIS (BLADE) ×1 IMPLANT
BLADE SURG 15 STRL SS (BLADE) ×3
CHLORAPREP W/TINT 26 (MISCELLANEOUS) ×3 IMPLANT
CLIP APPLIE ROT 10 11.4 M/L (STAPLE) ×1 IMPLANT
CLOTH BEACON ORANGE TIMEOUT ST (SAFETY) ×3 IMPLANT
COVER LIGHT HANDLE STERIS (MISCELLANEOUS) ×6 IMPLANT
COVER WAND RF STERILE (DRAPES) ×2 IMPLANT
DECANTER SPIKE VIAL GLASS SM (MISCELLANEOUS) ×3 IMPLANT
DERMABOND ADVANCED (GAUZE/BANDAGES/DRESSINGS) ×2
DERMABOND ADVANCED .7 DNX12 (GAUZE/BANDAGES/DRESSINGS) ×1 IMPLANT
ELECT REM PT RETURN 9FT ADLT (ELECTROSURGICAL) ×3
ELECTRODE REM PT RTRN 9FT ADLT (ELECTROSURGICAL) ×1 IMPLANT
FILTER SMOKE EVAC LAPAROSHD (FILTER) ×3 IMPLANT
GLOVE BIO SURGEON STRL SZ 6.5 (GLOVE) ×2 IMPLANT
GLOVE BIO SURGEONS STRL SZ 6.5 (GLOVE) ×1
GLOVE BIOGEL PI IND STRL 6.5 (GLOVE) ×1 IMPLANT
GLOVE BIOGEL PI IND STRL 7.0 (GLOVE) ×3 IMPLANT
GLOVE BIOGEL PI INDICATOR 6.5 (GLOVE) ×2
GLOVE BIOGEL PI INDICATOR 7.0 (GLOVE) ×4
GLOVE ECLIPSE 6.5 STRL STRAW (GLOVE) ×2 IMPLANT
GLOVE SURG SS PI 7.5 STRL IVOR (GLOVE) ×2 IMPLANT
GOWN STRL REUS W/TWL LRG LVL3 (GOWN DISPOSABLE) ×9 IMPLANT
HEMOSTAT ARISTA ABSORB 3G PWDR (HEMOSTASIS) ×2 IMPLANT
HEMOSTAT SNOW SURGICEL 2X4 (HEMOSTASIS) ×5 IMPLANT
INST SET LAPROSCOPIC AP (KITS) ×3 IMPLANT
IV NS IRRIG 3000ML ARTHROMATIC (IV SOLUTION) IMPLANT
KIT TURNOVER KIT A (KITS) ×3 IMPLANT
MANIFOLD NEPTUNE II (INSTRUMENTS) ×3 IMPLANT
NDL INSUFFLATION 14GA 120MM (NEEDLE) ×1 IMPLANT
NEEDLE INSUFFLATION 14GA 120MM (NEEDLE) ×3 IMPLANT
NS IRRIG 1000ML POUR BTL (IV SOLUTION) ×3 IMPLANT
PACK LAP CHOLE LZT030E (CUSTOM PROCEDURE TRAY) ×3 IMPLANT
PAD ARMBOARD 7.5X6 YLW CONV (MISCELLANEOUS) ×3 IMPLANT
SET BASIN LINEN APH (SET/KITS/TRAYS/PACK) ×3 IMPLANT
SET TUBE IRRIG SUCTION NO TIP (IRRIGATION / IRRIGATOR) IMPLANT
SLEEVE ENDOPATH XCEL 5M (ENDOMECHANICALS) ×3 IMPLANT
SUT MNCRL AB 4-0 PS2 18 (SUTURE) ×5 IMPLANT
SUT VICRYL 0 UR6 27IN ABS (SUTURE) ×3 IMPLANT
SYS BAG RETRIEVAL 10MM (BASKET) ×1
SYSTEM BAG RETRIEVAL 10MM (BASKET) ×1 IMPLANT
TROCAR ENDO BLADELESS 11MM (ENDOMECHANICALS) ×3 IMPLANT
TROCAR XCEL NON-BLD 5MMX100MML (ENDOMECHANICALS) ×3 IMPLANT
TROCAR XCEL UNIV SLVE 11M 100M (ENDOMECHANICALS) ×3 IMPLANT
TUBE CONNECTING 12'X1/4 (SUCTIONS) ×1
TUBE CONNECTING 12X1/4 (SUCTIONS) ×2 IMPLANT
TUBING INSUFFLATION (TUBING) ×3 IMPLANT
WARMER LAPAROSCOPE (MISCELLANEOUS) ×3 IMPLANT

## 2019-01-13 NOTE — H&P (Signed)
History and Physical    Douglas Mason TIW:[id number] DOB: 05/08/1951 DOA: 01/12/2019  PCP: Celene Squibb, MD   Patient coming from: Home   Chief Complaint: RUQ pain, N/V   HPI: Douglas Mason is a 68 y.o. male with medical history significant for non-insulin-dependent type 2 diabetes mellitus, hypertension, and GERD, now presenting to the emergency department for evaluation of severe right upper quadrant pain with nausea and vomiting.  Pain began acutely at approximately 8 PM, not long after he had dinner.  Pain is severe, constant, radiating from the right lateral chest wall to his right upper quadrant of the abdomen.  He has had diaphoresis and nausea with nonbloody vomiting associated with this.  Pain continued to be severe, prompting him to come into the ED.  He had similar symptoms a couple weeks ago, was seen at an urgent care at that time, etiology was not identified, and his symptoms resolved.  He denies any recent fevers, chills, shortness of breath, or cough.  ED Course: Upon arrival to the ED, patient is found to be bradycardic in the mid 50s and slightly hypertensive.  EKG features sinus bradycardia with rate 52 and chest x-ray is negative for acute cardiopulmonary disease.  Chemistry panel is notable for a glucose of 253 and CBC features a leukocytosis to 12,500 and slight normocytic anemia.  Troponin is undetectable.  CTA chest/abdomen/pelvis is negative for acute aortic syndrome but concerning for a stone at the gallbladder neck with distended gallbladder and trace enhancement of the gallbladder fossa concerning for acute cholecystitis.  Patient was given 2 L of IV fluids, Zofran, fentanyl, and Dilaudid in the ED.  He was also given a gram of IV Rocephin.  Surgery was consulted by the ED physician and recommends medical admission for ongoing evaluation and management.    Review of Systems:  All other systems reviewed and apart from HPI, are negative.  Past Medical History:    Diagnosis Date   Bronchitis    Diabetes mellitus without complication (HCC)    GERD (gastroesophageal reflux disease)    High cholesterol    Hypertension    Pleurisy    Pneumonia     History reviewed. No pertinent surgical history.   reports that he quit smoking about 42 years ago. His smoking use included cigarettes. He has a 60.00 pack-year smoking history. He quit smokeless tobacco use about 15 years ago.  His smokeless tobacco use included chew. He reports that he does not drink alcohol or use drugs.  No Known Allergies  Family History  Problem Relation Age of Onset   Asthma Mother    Leukemia Mother    Rheum arthritis Mother    Rheum arthritis Maternal Grandmother      Prior to Admission medications   Medication Sig Start Date End Date Taking? Authorizing Provider  albuterol (PROVENTIL HFA;VENTOLIN HFA) 108 (90 BASE) MCG/ACT inhaler Inhale 2 puffs into the lungs every 4 (four) hours as needed for wheezing or shortness of breath. 06/28/14   Virgel Manifold, MD  albuterol (PROVENTIL) (2.5 MG/3ML) 0.083% nebulizer solution Take 3 mLs (2.5 mg total) by nebulization every 6 (six) hours as needed for wheezing or shortness of breath. 08/18/14   Parrett, Fonnie Mu, NP  ALPRAZolam Duanne Moron) 0.5 MG tablet Take 1 tablet by mouth at bedtime as needed. anxiety 01/12/14   [provider]  aspirin EC 81 MG tablet Take 81 mg by mouth daily.    [provider]  benzonatate (TESSALON) 100  MG capsule Take 1 capsule (100 mg total) by mouth every 8 (eight) hours. 04/28/18   Rancour, Annie Main, MD  BYSTOLIC 5 MG tablet TAKE (1) TABLET BY MOUTH ONCE DAILY. 02/22/15   Parrett, Fonnie Mu, NP  chlorpheniramine (CHLORPHEN) 4 MG tablet Take 1 tablet (4 mg total) by mouth at bedtime as needed for allergies. 08/18/14   Parrett, Fonnie Mu, NP  doxycycline (VIBRAMYCIN) 100 MG capsule Take 1 capsule (100 mg total) by mouth 2 (two) times daily. 04/28/18   Rancour, Annie Main, MD  famotidine  (PEPCID) 20 MG tablet One at bedtime 08/04/14   Tanda Rockers, MD  GLIPIZIDE XL 5 MG 24 hr tablet Take 1 tablet by mouth daily. 12/23/14   [provider]  Multiple Vitamins-Minerals (MULTIVITAMINS THER. W/MINERALS) TABS tablet Take 1 tablet by mouth daily.    [provider]  pantoprazole (PROTONIX) 40 MG tablet Take 1 tablet (40 mg total) by mouth 2 (two) times daily before a meal. 03/17/14   Kelby Aline, MD  pravastatin (PRAVACHOL) 40 MG tablet Take 1 tablet by mouth daily. 01/01/14   [provider]  predniSONE (DELTASONE) 50 MG tablet 1 tablet PO daily 04/28/18   Ezequiel Essex, MD    Physical Exam: Vitals:   01/13/19 0000 01/13/19 0030 01/13/19 0100 01/13/19 0130  BP: (!) 167/83 (!) 161/82 (!) 159/84 (!) 155/74  Pulse: (!) 55 (!) 54 (!) 58 (!) 52  Resp:   20 20  SpO2: 98% 97% 98% 93%  Weight:      Height:        Constitutional: NAD, appears uncomfortable Eyes: PERTLA, lids and conjunctivae normal ENMT: Mucous membranes are moist. Posterior pharynx clear of any exudate or lesions.   Neck: normal, supple, no masses, no thyromegaly Respiratory: clear to auscultation bilaterally, no wheezing, no crackles. No accessory muscle use.  Cardiovascular: S1 & S2 heard, regular rate and rhythm. No extremity edema.  Abdomen: No distension, soft, tender in RUQ. Bowel sounds active.  Musculoskeletal: no clubbing / cyanosis. No joint deformity upper and lower extremities.    Skin: no significant rashes, lesions, ulcers. Warm, dry, well-perfused. Neurologic: CN 2-12 grossly intact. Sensation intact. Strength 5/5 in all 4 limbs.  Psychiatric:  Alert and oriented x 3. Pleasant, cooperative.    Labs on Admission: I have personally reviewed following labs and imaging studies  CBC: Recent Labs  Lab 01/12/19 2349  WBC 12.5*  HGB 12.9*  HCT 38.0*  MCV 93.8  PLT 355   Basic Metabolic Panel: Recent Labs  Lab 01/12/19 2349  NA 140  K 4.2  CL 104  CO2 22    GLUCOSE 253*  BUN 17  CREATININE 0.98  CALCIUM 8.8*   GFR: Estimated Creatinine Clearance: 88.9 mL/min (by C-G formula based on SCr of 0.98 mg/dL). Liver Function Tests: Recent Labs  Lab 01/12/19 2349  AST 29  ALT 35  ALKPHOS 71  BILITOT 0.9  PROT 7.8  ALBUMIN 4.4   Recent Labs  Lab 01/12/19 2349  LIPASE 48   No results for input(s): AMMONIA in the last 168 hours. Coagulation Profile: No results for input(s): INR, PROTIME in the last 168 hours. Cardiac Enzymes: Recent Labs  Lab 01/12/19 2349  TROPONINI <0.03   BNP (last 3 results) No results for input(s): PROBNP in the last 8760 hours. HbA1C: No results for input(s): HGBA1C in the last 72 hours. CBG: No results for input(s): GLUCAP in the last 168 hours. Lipid Profile: No results for input(s): CHOL,  HDL, LDLCALC, TRIG, CHOLHDL, LDLDIRECT in the last 72 hours. Thyroid Function Tests: No results for input(s): TSH, T4TOTAL, FREET4, T3FREE, THYROIDAB in the last 72 hours. Anemia Panel: No results for input(s): VITAMINB12, FOLATE, FERRITIN, TIBC, IRON, RETICCTPCT in the last 72 hours. Urine analysis:    Component Value Date/Time   COLORURINE YELLOW 03/13/2014 1115   APPEARANCEUR CLEAR 03/13/2014 1115   LABSPEC 1.031 (H) 03/13/2014 1115   PHURINE 5.0 03/13/2014 1115   GLUCOSEU NEGATIVE 03/13/2014 1115   HGBUR NEGATIVE 03/13/2014 1115   BILIRUBINUR MODERATE (A) 03/13/2014 1115   KETONESUR 15 (A) 03/13/2014 1115   PROTEINUR 100 (A) 03/13/2014 1115   UROBILINOGEN 4.0 (H) 03/13/2014 1115   NITRITE POSITIVE (A) 03/13/2014 1115   LEUKOCYTESUR TRACE (A) 03/13/2014 1115   Sepsis Labs: @LABRCNTIP (procalcitonin:4,lacticidven:4) )No results found for this or any previous visit (from the past 240 hour(s)).   Radiological Exams on Admission: Dg Abdomen 1 View  Result Date: 01/13/2019 CLINICAL DATA:  Shortness of breath chest pain EXAM: ABDOMEN - 1 VIEW COMPARISON:  None. FINDINGS: The bowel gas pattern is normal. No  radio-opaque calculi or other significant radiographic abnormality are seen. No free air. IMPRESSION: No free air. Electronically Signed   By: Ulyses Jarred M.D.   On: 01/13/2019 00:33   Dg Chest Port 1 View  Result Date: 01/13/2019 CLINICAL DATA:  Chest pain and shortness of breath EXAM: PORTABLE CHEST 1 VIEW COMPARISON:  04/28/2018 FINDINGS: The heart size and mediastinal contours are within normal limits. Both lungs are clear aside from unchanged right basilar atelectasis. The visualized skeletal structures are unremarkable. IMPRESSION: No acute airspace disease. Electronically Signed   By: Ulyses Jarred M.D.   On: 01/13/2019 00:32   Ct Angio Chest/abd/pel For Dissection W And/or Wo Contrast  Result Date: 01/13/2019 CLINICAL DATA:  Chest pain and right-sided flank pain.  Diaphoresis. EXAM: CT ANGIOGRAPHY CHEST, ABDOMEN AND PELVIS TECHNIQUE: Multidetector CT imaging through the chest, abdomen and pelvis was performed using the standard protocol during bolus administration of intravenous contrast. Multiplanar reconstructed images and MIPs were obtained and reviewed to evaluate the vascular anatomy. CONTRAST:  123mL OMNIPAQUE IOHEXOL 350 MG/ML SOLN COMPARISON:  CTA chest 01/27/2014 FINDINGS: CTA CHEST FINDINGS Cardiovascular: --Heart: The heart size is normal.  There is nopericardial effusion. --Aorta: The course and caliber of the thoracic aorta are normal. There is no aortic atherosclerotic calcification. Precontrast images show no aortic intramural hematoma. There is no blood pool, dissection or penetrating ulcer demonstrated on arterial phase postcontrast imaging. There is a conventional 3 vessel aortic arch branching pattern. The proximal arch vessels are widely patent. --Pulmonary Arteries: Contrast timing is optimized for preferential opacification of the aorta. Within that limitation, normal central pulmonary arteries. Mediastinum/Nodes: No mediastinal, hilar or axillary lymphadenopathy. The  visualized thyroid and thoracic esophageal course are unremarkable. Lungs/Pleura: No pulmonary nodules or masses. No pleural effusion or pneumothorax. No focal airspace consolidation. No focal pleural abnormality. Musculoskeletal: No chest wall abnormality. No acute osseous findings. Review of the MIP images confirms the above findings. CTA ABDOMEN AND PELVIS FINDINGS VASCULAR Aorta: Normal caliber aorta without aneurysm, dissection, vasculitis or hemodynamically significant stenosis. There is no aortic atherosclerosis. Celiac: No aneurysm, dissection or hemodynamically significant stenosis. SMA: Widely patent without dissection or stenosis. Replaced right hepatic artery. Renals: Paired renal arteries bilaterally. No aneurysm, dissection, stenosis or evidence of fibromuscular dysplasia. IMA: Patent without abnormality. Inflow: No aneurysm, stenosis or dissection. Veins: Normal course and caliber of the major veins. Assessment is otherwise limited by  the arterial dominant contrast phase. Review of the MIP images confirms the above findings. NON-VASCULAR Hepatobiliary: Normal hepatic contours and density. No visible biliary dilatation. There is a stone at the gallbladder neck. The gallbladder is distended. There is mild enhancement at the gallbladder fossa. Pancreas: Normal contours without ductal dilatation. No peripancreatic fluid collection. Spleen: Normal arterial phase splenic enhancement pattern. Adrenals/Urinary Tract: --Adrenal glands: Normal. --Right kidney/ureter: No hydronephrosis or perinephric stranding. No nephrolithiasis. No obstructing ureteral stones. --Left kidney/ureter: No hydronephrosis or perinephric stranding. No nephrolithiasis. No obstructing ureteral stones. --Urinary bladder: Unremarkable. Stomach/Bowel: --Stomach/Duodenum: No hiatal hernia or other gastric abnormality. Normal duodenal course and caliber. --Small bowel: No dilatation or inflammation. --Colon: No focal abnormality.  --Appendix: Normal. Lymphatic:  No abdominal or pelvic lymphadenopathy. Reproductive: Normal prostate and seminal vesicles. Musculoskeletal. No bony spinal canal stenosis or focal osseous abnormality. Other: None. Review of the MIP images confirms the above findings. IMPRESSION: 1. No acute aortic syndrome. 2. Stone at the gallbladder neck with distended gallbladder and trace enhancement at the gallbladder fossa, likely indicating acute cholecystitis. Right upper quadrant ultrasound is recommended. Electronically Signed   By: Ulyses Jarred M.D.   On: 01/13/2019 01:29    EKG: Independently reviewed. Sinus bradycardia (rate 52).   Assessment/Plan   1. Acute cholecystitis  - Presents with a few hours of severe RUQ pain and N/V  - CT reveals stone at gallbladder neck with distended gallbladder and enhancement at GB fossa concerning for acute cholecystitis  - He was treated with IVF, analgesia, and Rocephin in ED  - Surgery consulting  - Continue bowel-rest, pain-control, and IVF, continue Rocephin and add Flagyl   2. Type II DM  - No recent A1c on file  - Check CBG's and use SSI with Novolog while in hospital   3. Hypertension  - BP elevated in ED with pain likely contributing   - Continue pain-control, use hydralazine IVP's as needed while npo     PPE: Mask, face shield  DVT prophylaxis: SCD's  Code Status: Full  Family Communication: Discussed with patient  Consults called: Surgery  Admission status: Observation     Vianne Bulls, MD [facility] Pager [phone]  If 7PM-7AM, please contact night-coverage [email or web address] Password [email or web address]  01/13/2019, 2:07 AM

## 2019-01-13 NOTE — Anesthesia Postprocedure Evaluation (Signed)
Anesthesia Post Note  Patient: Douglas Mason  Procedure(s) Performed: LAPAROSCOPIC CHOLECYSTECTOMY (N/A Abdomen)  Patient location during evaluation: PACU Anesthesia Type: General Level of consciousness: awake and oriented Pain management: pain level controlled Vital Signs Assessment: post-procedure vital signs reviewed and stable Respiratory status: spontaneous breathing and patient connected to nasal cannula oxygen Cardiovascular status: stable Postop Assessment: no apparent nausea or vomiting Anesthetic complications: no     Last Vitals:  Vitals:   01/13/19 1150 01/13/19 1200  BP:  116/61  Pulse: 92 86  Resp: (!) 24 (!) 21  Temp:    SpO2: 93% 93%    Last Pain:  Vitals:   01/13/19 1150  TempSrc:   PainSc: Asleep                 ADAMS, AMY A

## 2019-01-13 NOTE — Transfer of Care (Signed)
Immediate Anesthesia Transfer of Care Note  Patient: Douglas Mason  Procedure(s) Performed: LAPAROSCOPIC CHOLECYSTECTOMY (N/A Abdomen)  Patient Location: PACU  Anesthesia Type:General  Level of Consciousness: awake, alert , oriented and patient cooperative  Airway & Oxygen Therapy: Patient Spontanous Breathing and Patient connected to face mask oxygen  Post-op Assessment: Report given to RN and Post -op Vital signs reviewed and stable  Post vital signs: Reviewed and stable  Last Vitals:  Vitals Value Taken Time  BP 117/68 01/13/2019 11:30 AM  Temp    Pulse 89 01/13/2019 11:31 AM  Resp 24 01/13/2019 11:31 AM  SpO2 99 % 01/13/2019 11:31 AM  Vitals shown include unvalidated device data.  Last Pain:  Vitals:   01/13/19 0819  TempSrc:   PainSc: 8          Complications: No apparent anesthesia complications

## 2019-01-13 NOTE — Progress Notes (Signed)
Inpatient Diabetes Program Recommendations  AACE/ADA: New Consensus Statement on Inpatient Glycemic Control (2015)  Target Ranges:  Prepandial:   less than 140 mg/dL      Peak postprandial:   less than 180 mg/dL (1-2 hours)      Critically ill patients:  140 - 180 mg/dL   Lab Results  Component Value Date   GLUCAP 228 (H) 01/13/2019   HGBA1C 5.5 03/13/2014    Review of Glycemic Control  Diabetes history: DM 2 Outpatient Diabetes medications: Glipizide 5 mg Daily Current orders for Inpatient glycemic control: Novolog 0-9 units Q4 hours  Inpatient Diabetes Program Recommendations:    Patient's glucose trends in 200's before surgery. Patient received Decadron 8 mg. Anticipated glucose trends to increase. Consider increasing Novolog Correction to 0-20 units. Will need to decrease in 24-48 hours when Decadron clears system.  Thanks,  Tama Headings RN, MSN, BC-ADM Inpatient Diabetes Coordinator Team Pager [phone] (8a-5p)

## 2019-01-13 NOTE — Discharge Summary (Signed)
Physician Discharge Summary  Patient ID: Douglas Mason MRN: [id number] DOB/AGE: Jan 15, 1951 68 y.o.  Admit date: 01/12/2019 Discharge date: 01/13/2019  Admission Diagnoses: Acute cholecystitis   Discharge Diagnoses:  Principal Problem:   Acute cholecystitis Active Problems:   Essential hypertension   Diabetes mellitus type II, non insulin dependent (HCC)   Anxiety   Calculus of gallbladder with acute cholecystitis without obstruction   Discharged Condition: good  Hospital Course: Douglas Mason is a 68 yo who came in with acute onset of RUQ pain that was not alleviated and CT findings of acute cholecystitis without biliary dilation. He was taken to the OR and underwent a laparoscopic cholecystectomy without issues. Post operatively, he had some pain control issues and stayed overnight. Prior to discharge home he had adequate pain control, was tolerating a diet, and was ambulating. He was told to hold his metformin until Saturday 5/16 due to receiving IV contrast, and started back on his glipizide. He did have some elevated BS during the hospital stay and was on SSI for a time.    Consults: Hospitalist- initial admission, diabetes management  Significant Diagnostic Studies: CT a/p- acute cholecystitis   Treatments: IV hydration, antibiotics: ceftriaxone and surgery: 01/13/2019 Lap cholecystectomy  Discharge Exam: Blood pressure (!) 107/56, pulse 79, temperature 98.1 F (36.7 C), temperature source Oral, resp. rate (!) 21, height 6' (1.829 m), weight 105.8 kg, SpO2 93 %. General appearance: alert, cooperative and no distress Resp: normal work of breathing GI: soft, mildly distended, appropriatey tender, dermabond in place no erythema or drainage  Disposition:   Discharge Instructions    Call MD for:  difficulty breathing, headache or visual disturbances   Complete by:  As directed    Call MD for:  extreme fatigue   Complete by:  As directed    Call MD for:  persistant  dizziness or light-headedness   Complete by:  As directed    Call MD for:  persistant nausea and vomiting   Complete by:  As directed    Call MD for:  redness, tenderness, or signs of infection (pain, swelling, redness, odor or green/yellow discharge around incision site)   Complete by:  As directed    Call MD for:  severe uncontrolled pain   Complete by:  As directed    Call MD for:  temperature >100.4   Complete by:  As directed    Diet - low sodium heart healthy   Complete by:  As directed    Increase activity slowly   Complete by:  As directed      Allergies as of 01/13/2019   No Known Allergies     Medication List    TAKE these medications   albuterol 108 (90 Base) MCG/ACT inhaler Commonly known as:  VENTOLIN HFA Inhale 2 puffs into the lungs every 4 (four) hours as needed for wheezing or shortness of breath.   albuterol (2.5 MG/3ML) 0.083% nebulizer solution Commonly known as:  PROVENTIL Take 3 mLs (2.5 mg total) by nebulization every 6 (six) hours as needed for wheezing or shortness of breath.   ALPRAZolam 0.5 MG tablet Commonly known as:  XANAX Take 1 tablet by mouth at bedtime as needed. anxiety   aspirin EC 81 MG tablet Take 81 mg by mouth daily.   benzonatate 100 MG capsule Commonly known as:  TESSALON Take 1 capsule (100 mg total) by mouth every 8 (eight) hours.   Bystolic 5 MG tablet Generic drug:  nebivolol TAKE (1) TABLET BY MOUTH  ONCE DAILY.   chlorpheniramine 4 MG tablet Commonly known as:  Chlorphen Take 1 tablet (4 mg total) by mouth at bedtime as needed for allergies.   docusate sodium 100 MG capsule Commonly known as:  COLACE Take 1 capsule (100 mg total) by mouth 2 (two) times daily.   doxycycline 100 MG capsule Commonly known as:  VIBRAMYCIN Take 1 capsule (100 mg total) by mouth 2 (two) times daily.   famotidine 20 MG tablet Commonly known as:  Pepcid One at bedtime   glipiZIDE XL 5 MG 24 hr tablet Generic drug:  glipiZIDE Take 1  tablet by mouth daily.   multivitamins ther. w/minerals Tabs tablet Take 1 tablet by mouth daily.   ondansetron 4 MG tablet Commonly known as:  ZOFRAN Take 1 tablet (4 mg total) by mouth every 6 (six) hours as needed for nausea.   oxyCODONE 5 MG immediate release tablet Commonly known as:  Oxy IR/ROXICODONE Take 1 tablet (5 mg total) by mouth every 4 (four) hours as needed for severe pain or breakthrough pain.   pantoprazole 40 MG tablet Commonly known as:  PROTONIX Take 1 tablet (40 mg total) by mouth 2 (two) times daily before a meal.   pravastatin 40 MG tablet Commonly known as:  PRAVACHOL Take 1 tablet by mouth daily.   predniSONE 50 MG tablet Commonly known as:  DELTASONE 1 tablet PO daily      Follow-up Information    Virl Cagey, MD On 01/26/2019.   Specialty:  General Surgery Why:  This is a Phone Call visit. Dr. Constance Haw will call you on 01/26/2019 at 11PM. If you have issues or concerns, you can call the office and arrange to be seen in person. Otherwise your post op will be a phone call due to [place]ia 19.  Contact information: [address] [place] [facility] [address] [phone]        Celene Squibb, MD Follow up in 1 week(s).   Specialty:  Internal Medicine Why:  1-2 weeks Contact information: [place] [address] [phone]           Signed: Virl Cagey 01/13/2019, 12:52 PM

## 2019-01-13 NOTE — ED Notes (Signed)
Pt states he does not need to urinate at this time, aware of DO, urinal at bs

## 2019-01-13 NOTE — Progress Notes (Signed)
Patient ambulated in Douglas Mason, tolerated well. Removed oxygen and patient 93% on room air. Patient states he will stay overnight for pain control as he is experiencing moderate to severe pain in RUQ with movement.

## 2019-01-13 NOTE — Anesthesia Procedure Notes (Signed)
Procedure Name: Intubation Date/Time: 01/13/2019 9:52 AM Performed by: Andree Elk, Amy A, CRNA Pre-anesthesia Checklist: Patient identified, Patient being monitored, Timeout performed, Emergency Drugs available and Suction available Patient Re-evaluated:Patient Re-evaluated prior to induction Oxygen Delivery Method: Circle system utilized Preoxygenation: Pre-oxygenation with 100% oxygen Induction Type: IV induction and Rapid sequence Laryngoscope Size: 3 and Glidescope Grade View: Grade I Tube type: Oral Tube size: 7.0 mm Number of attempts: 1 Airway Equipment and Method: Video-laryngoscopy Placement Confirmation: ETT inserted through vocal cords under direct vision,  positive ETCO2 and breath sounds checked- equal and bilateral Secured at: 21 cm Tube secured with: Tape Dental Injury: Teeth and Oropharynx as per pre-operative assessment

## 2019-01-13 NOTE — Op Note (Signed)
Operative Note   Preoperative Diagnosis: Acute cholecystitis    Postoperative Diagnosis: Same   Procedure(s) Performed: Laparoscopic cholecystectomy   Surgeon: Ria Comment C. Constance Haw, MD   Assistants: Aviva Signs, MD    Anesthesia: General endotracheal   Anesthesiologist: Nicanor Alcon, MD    Specimens: Gallbladder    Estimated Blood Loss: Minimal    Blood Replacement: None    Complications: None    Operative Findings: Distended and inflamed gallbladder with significant omental adhesions and thickened rind    Procedure: The patient was taken to the operating room and placed supine. General endotracheal anesthesia was induced. Intravenous antibiotics were administered per protocol. An orogastric tube positioned to decompress the stomach. The abdomen was prepared and draped in the usual sterile fashion.    A supraumbilical incision was made and a Veress technique was utilized to achieve pneumoperitoneum to 15 mmHg with carbon dioxide. A 11 mm optiview port was placed through the supraumbilical region, and a 10 mm 0-degree operative laparoscope was introduced. The area underlying the trocar and Veress needle were inspected and without evidence of injury.  Remaining trocars were placed under direct vision. Two 5 mm ports were placed in the right abdomen, between the anterior axillary and midclavicular line.  A final 11 mm port was placed through the mid-epigastrium, near the falciform ligament.    The gallbladder was covered by omentum that was adherent. He was placed in more reverse trendelenberg and left lateral decubitus position to help facilitate exposure. Once the omentum was cleared from the edge of the liver and off the gallbladder more of the colon could be retracted downward. Care was taken to not injure the colon. The gallbladder fundus was elevated cephalad as much as possible but due to the inflammation and distention this was difficult.  I started to take down the peritoneal  attachments anteriorly on the gallbladder to get better exposure, and got into the gallbladder. This decompressed the gallbladder, and helped to facilitate the retraction cephalad. The infundibulum was retracted to the patient's right as we could expose the inferior edge.  The medial and lateral edges of the gallbladder were dissected, and more exposure of the infundibulum was obtained.  The gallbladder/cystic duct junction was skeletonized. The cystic artery noted in the triangle of Calot and was also skeletonized.  We then continued liberal medial and lateral dissection until the critical view of safety was achieved.    The cystic duct was doubly clipped and divided, but one clip started to slip as I was cutting. I was able to get two more clips on for a total of three clips on the cystic duct stump.  The cystic artery was triply clipped and divided. The posterior mesentery of the gallbladder was cleared and ensured to be high on the hepatic plate, and this was clipped given the possibly of an accessory artery.  The gallbladder was then dissected from the liver bed with electrocautery. The gallbladder was intrahepatic and there was some difficulty in getting it off the bed, but after continued medial and lateral retraction we were able to get it off.  The specimen was placed in an Endopouch.    Final inspection revealed acceptable hemostasis. Arista and Surgical Emogene Morgan were placed in the gallbladder bed. Given the COVID 19 pandemic, the pneumoperitoneum was evacuated through the [facility], and then the endopouch was removed through the epigastric site.  0 Vicryl fascial sutures were used to close the epigastric site but the umbilical site was smaller than my  finger.  All trocars were removed and pneumoperitoneum was released. Skin incisions were closed with 4-0 Monocryl subcuticular sutures and Dermabond. The patient was awakened from anesthesia and extubated without complication.    Curlene Labrum,  MD [facility] [address] [place], [place] [address] [phone] (office)

## 2019-01-13 NOTE — Care Management Obs Status (Signed)
[place] NOTIFICATION   Patient Details  Name: Douglas Mason MRN: [id number] Date of Birth: 1950-12-02   Medicare Observation Status Notification Given:  Yes    Tommy Medal 01/13/2019, 4:13 PM

## 2019-01-13 NOTE — Anesthesia Preprocedure Evaluation (Signed)
Anesthesia Evaluation  Patient identified by MRN, date of birth, ID band Patient awake    Reviewed: Allergy & Precautions, H&P , NPO status , Patient's Chart, lab work & pertinent test results, reviewed documented beta blocker date and time   Airway Mallampati: III  TM Distance: <3 FB Neck ROM: full    Dental no notable dental hx.    Pulmonary shortness of breath, sleep apnea and Continuous Positive Airway Pressure Ventilation , pneumonia, former smoker,    Pulmonary exam normal breath sounds clear to auscultation       Cardiovascular Exercise Tolerance: Good hypertension, negative cardio ROS   Rhythm:regular Rate:Normal     Neuro/Psych Anxiety negative neurological ROS  negative psych ROS   GI/Hepatic Neg liver ROS, GERD  ,  Endo/Other  negative endocrine ROSdiabetes  Renal/GU negative Renal ROS  negative genitourinary   Musculoskeletal   Abdominal   Peds  Hematology negative hematology ROS (+)   Anesthesia Other Findings   Reproductive/Obstetrics negative OB ROS                             Anesthesia Physical Anesthesia Plan  ASA: III  Anesthesia Plan: General   Post-op Pain Management:    Induction:   PONV Risk Score and Plan:   Airway Management Planned:   Additional Equipment:   Intra-op Plan:   Post-operative Plan:   Informed Consent: I have reviewed the patients History and Physical, chart, labs and discussed the procedure including the risks, benefits and alternatives for the proposed anesthesia with the patient or authorized representative who has indicated his/her understanding and acceptance.     Dental Advisory Given  Plan Discussed with: CRNA  Anesthesia Plan Comments:         Anesthesia Quick Evaluation

## 2019-01-13 NOTE — Progress Notes (Signed)
Patient transported to pre-op.

## 2019-01-13 NOTE — ED Provider Notes (Signed)
Emergency Department Provider Note   I have reviewed the triage vital signs and the nursing notes.   HISTORY  Chief Complaint Chest Pain   HPI Douglas Mason is a 68 y.o. male who presents the emergency department today with acute onset of right sided upper abdomen and lower chest pain that seems to radiate around to his back.  This started around 8:00 yesterday evening is progressed through the night and associated with nausea and few episodes of nonbloody nonbilious vomiting.  Patient states he had never anything this before.  He has no other associated symptoms such as shortness of breath, rash, leg swelling, recent fevers or cough.  No other abdominal symptoms.  No urinary symptoms.   No other associated or modifying symptoms.    Past Medical History:  Diagnosis Date  . Bronchitis   . Diabetes mellitus without complication ([place])   . GERD (gastroesophageal reflux disease)   . High cholesterol   . Hypertension   . Pleurisy   . Pneumonia     Patient Active Problem List   Diagnosis Date Noted  . Acute cholecystitis 01/13/2019  . Anxiety 01/13/2019  . Dyspnea 08/04/2014  . OSA (obstructive sleep apnea) 05/02/2014  . Upper airway cough syndrome 03/16/2014  . CAP (community acquired pneumonia) 03/15/2014  . Atrial flutter ([place]) 03/15/2014  . Pericardial effusion by CT, not seen by echo 03/15/2014  . Chest pain with moderate risk of acute coronary syndrome 03/13/2014  . Essential hypertension 03/13/2014  . Diabetes mellitus type II, non insulin dependent ([place]) 03/13/2014  . GERD (gastroesophageal reflux disease) 03/13/2014    History reviewed. No pertinent surgical history.    Allergies Patient has no known allergies.  Family History  Problem Relation Age of Onset  . Asthma Mother   . Leukemia Mother   . Rheum arthritis Mother   . Rheum arthritis Maternal Grandmother     Social History Social History   Tobacco Use  . Smoking status: Former Smoker   Packs/day: 2.00    Years: 30.00    Pack years: 60.00    Types: Cigarettes    Last attempt to quit: 09/02/1976    Years since quitting: 42.3  . Smokeless tobacco: Former Systems developer    Types: Chew    Quit date: 09/03/2003  Substance Use Topics  . Alcohol use: No  . Drug use: No    Review of Systems  All other systems negative except as documented in the HPI. All pertinent positives and negatives as reviewed in the HPI. ____________________________________________   PHYSICAL EXAM:  VITAL SIGNS: ED Triage Vitals  Enc Vitals Group     BP 01/12/19 2349 (!) 176/91     Pulse Rate 01/12/19 2349 (!) 54     Resp 01/12/19 2349 20     Temp 01/13/19 0511 98 F (36.7 C)     Temp Source 01/13/19 0511 Oral     SpO2 01/12/19 2349 97 %     Weight 01/12/19 2346 230 lb (104.3 kg)     Height 01/12/19 2346 6' (1.829 m)    Constitutional: Alert and oriented. Well appearing and in moderate distress distress. Eyes: Conjunctivae are normal. PERRL. EOMI. Head: Atraumatic. Nose: No congestion/rhinnorhea. Mouth/Throat: Mucous membranes are moist.  Oropharynx non-erythematous. Neck: No stridor.  No meningeal signs.   Cardiovascular: Bradycardic rate, regular rhythm. Good peripheral circulation. Grossly normal heart sounds.   Respiratory: Tachypneic respiratory effort.  No retractions. Lungs CTAB. Gastrointestinal: Soft and nontender. No distention.  Musculoskeletal: No  lower extremity tenderness nor edema. No gross deformities of extremities. Neurologic:  Normal speech and language. No gross focal neurologic deficits are appreciated.  Skin:  Skin is warm, diaphoretic and intact. No rash noted.   ____________________________________________   LABS (all labs ordered are listed, but only abnormal results are displayed)  Labs Reviewed  BASIC METABOLIC PANEL - Abnormal; Notable for the following components:      Result Value   Glucose, Bld 253 (*)    Calcium 8.8 (*)    All other components within  normal limits  CBC - Abnormal; Notable for the following components:   WBC 12.5 (*)    RBC 4.05 (*)    Hemoglobin 12.9 (*)    HCT 38.0 (*)    All other components within normal limits  URINALYSIS, ROUTINE W REFLEX MICROSCOPIC - Abnormal; Notable for the following components:   Specific Gravity, Urine >1.046 (*)    Glucose, UA >=500 (*)    All other components within normal limits  COMPREHENSIVE METABOLIC PANEL - Abnormal; Notable for the following components:   Glucose, Bld 235 (*)    Total Protein 8.6 (*)    Total Bilirubin 1.4 (*)    All other components within normal limits  CBC WITH DIFFERENTIAL/PLATELET - Abnormal; Notable for the following components:   WBC 15.3 (*)    Neutro Abs 13.5 (*)    All other components within normal limits  GLUCOSE, CAPILLARY - Abnormal; Notable for the following components:   Glucose-Capillary 219 (*)    All other components within normal limits  SARS CORONAVIRUS 2 (HOSPITAL ORDER, [facility])  SURGICAL PCR SCREEN  TROPONIN I  HEPATIC FUNCTION PANEL  LIPASE, BLOOD  LIPASE, BLOOD  HIV ANTIBODY (ROUTINE TESTING W REFLEX)   ____________________________________________  EKG   EKG Interpretation  Date/Time:  Wednesday Jan 13 2019 00:03:34 EDT Ventricular Rate:  52 PR Interval:    QRS Duration: 95 QT Interval:  431 QTC Calculation: 401 R Axis:   4 Text Interpretation:  Sinus rhythm Abnormal R-wave progression, early transition Baseline wander in lead(s) V4 V6 slower rate, similar to previous otherwise Confirmed by Merrily Pew [phone]) on 01/13/2019 12:14:42 AM       ____________________________________________  RADIOLOGY  Dg Abdomen 1 View  Result Date: 01/13/2019 CLINICAL DATA:  Shortness of breath chest pain EXAM: ABDOMEN - 1 VIEW COMPARISON:  None. FINDINGS: The bowel gas pattern is normal. No radio-opaque calculi or other significant radiographic abnormality are seen. No free air. IMPRESSION: No free  air. Electronically Signed   By: Ulyses Jarred M.D.   On: 01/13/2019 00:33   Dg Chest Port 1 View  Result Date: 01/13/2019 CLINICAL DATA:  Chest pain and shortness of breath EXAM: PORTABLE CHEST 1 VIEW COMPARISON:  04/28/2018 FINDINGS: The heart size and mediastinal contours are within normal limits. Both lungs are clear aside from unchanged right basilar atelectasis. The visualized skeletal structures are unremarkable. IMPRESSION: No acute airspace disease. Electronically Signed   By: Ulyses Jarred M.D.   On: 01/13/2019 00:32   Ct Angio Chest/abd/pel For Dissection W And/or Wo Contrast  Result Date: 01/13/2019 CLINICAL DATA:  Chest pain and right-sided flank pain.  Diaphoresis. EXAM: CT ANGIOGRAPHY CHEST, ABDOMEN AND PELVIS TECHNIQUE: Multidetector CT imaging through the chest, abdomen and pelvis was performed using the standard protocol during bolus administration of intravenous contrast. Multiplanar reconstructed images and MIPs were obtained and reviewed to evaluate the vascular anatomy. CONTRAST:  194mL OMNIPAQUE IOHEXOL 350 MG/ML  SOLN COMPARISON:  CTA chest 01/27/2014 FINDINGS: CTA CHEST FINDINGS Cardiovascular: --Heart: The heart size is normal.  There is nopericardial effusion. --Aorta: The course and caliber of the thoracic aorta are normal. There is no aortic atherosclerotic calcification. Precontrast images show no aortic intramural hematoma. There is no blood pool, dissection or penetrating ulcer demonstrated on arterial phase postcontrast imaging. There is a conventional 3 vessel aortic arch branching pattern. The proximal arch vessels are widely patent. --Pulmonary Arteries: Contrast timing is optimized for preferential opacification of the aorta. Within that limitation, normal central pulmonary arteries. Mediastinum/Nodes: No mediastinal, hilar or axillary lymphadenopathy. The visualized thyroid and thoracic esophageal course are unremarkable. Lungs/Pleura: No pulmonary nodules or masses. No  pleural effusion or pneumothorax. No focal airspace consolidation. No focal pleural abnormality. Musculoskeletal: No chest wall abnormality. No acute osseous findings. Review of the MIP images confirms the above findings. CTA ABDOMEN AND PELVIS FINDINGS VASCULAR Aorta: Normal caliber aorta without aneurysm, dissection, vasculitis or hemodynamically significant stenosis. There is no aortic atherosclerosis. Celiac: No aneurysm, dissection or hemodynamically significant stenosis. SMA: Widely patent without dissection or stenosis. Replaced right hepatic artery. Renals: Paired renal arteries bilaterally. No aneurysm, dissection, stenosis or evidence of fibromuscular dysplasia. IMA: Patent without abnormality. Inflow: No aneurysm, stenosis or dissection. Veins: Normal course and caliber of the major veins. Assessment is otherwise limited by the arterial dominant contrast phase. Review of the MIP images confirms the above findings. NON-VASCULAR Hepatobiliary: Normal hepatic contours and density. No visible biliary dilatation. There is a stone at the gallbladder neck. The gallbladder is distended. There is mild enhancement at the gallbladder fossa. Pancreas: Normal contours without ductal dilatation. No peripancreatic fluid collection. Spleen: Normal arterial phase splenic enhancement pattern. Adrenals/Urinary Tract: --Adrenal glands: Normal. --Right kidney/ureter: No hydronephrosis or perinephric stranding. No nephrolithiasis. No obstructing ureteral stones. --Left kidney/ureter: No hydronephrosis or perinephric stranding. No nephrolithiasis. No obstructing ureteral stones. --Urinary bladder: Unremarkable. Stomach/Bowel: --Stomach/Duodenum: No hiatal hernia or other gastric abnormality. Normal duodenal course and caliber. --Small bowel: No dilatation or inflammation. --Colon: No focal abnormality. --Appendix: Normal. Lymphatic:  No abdominal or pelvic lymphadenopathy. Reproductive: Normal prostate and seminal vesicles.  Musculoskeletal. No bony spinal canal stenosis or focal osseous abnormality. Other: None. Review of the MIP images confirms the above findings. IMPRESSION: 1. No acute aortic syndrome. 2. Stone at the gallbladder neck with distended gallbladder and trace enhancement at the gallbladder fossa, likely indicating acute cholecystitis. Right upper quadrant ultrasound is recommended. Electronically Signed   By: Ulyses Jarred M.D.   On: 01/13/2019 01:29    ____________________________________________   PROCEDURES  Procedure(s) performed:   Procedures   ____________________________________________   INITIAL IMPRESSION / ASSESSMENT AND PLAN / ED COURSE  Initial concern for the patient was a ruptured gastric ulcer as I thought I saw free air under the right hemidiaphragm however after discussion with radiology he did not think this is likely.  And his blood pressure was high he was diaphoretic and excruciating pain in his abdomen, chest and back considered aortic dissection versus cardiac versus gallbladder issues so a CT dissection study was performed which did show a large gallstone in the neck of his gallbladder with other signs of cholecystitis.  I discussed with Dr. Constance Haw with surgery who recommended keeping him n.p.o. and treat him symptomatically and admit to medicine.  She will see in the morning for possible surgery later in the day.  Pertinent labs & imaging results that were available during my care of the patient were reviewed by me  and considered in my medical decision making (see chart for details).  ____________________________________________  FINAL CLINICAL IMPRESSION(S) / ED DIAGNOSES  Final diagnoses:  Calculus of gallbladder with acute cholecystitis without obstruction     MEDICATIONS GIVEN DURING THIS VISIT:  Medications  albuterol (VENTOLIN HFA) 108 (90 Base) MCG/ACT inhaler 2 puff (has no administration in time range)  insulin aspart (novoLOG) injection 0-9 Units (3  Units Subcutaneous Given 01/13/19 0428)  fentaNYL (SUBLIMAZE) injection 50-100 mcg (100 mcg Intravenous Given 01/13/19 0604)  metroNIDAZOLE (FLAGYL) IVPB 500 mg (500 mg Intravenous New Bag/Given 01/13/19 0430)  0.45 % sodium chloride infusion ( Intravenous New Bag/Given 01/13/19 0354)  acetaminophen (TYLENOL) tablet 650 mg (has no administration in time range)    Or  acetaminophen (TYLENOL) suppository 650 mg (has no administration in time range)  ondansetron (ZOFRAN) tablet 4 mg (has no administration in time range)    Or  ondansetron (ZOFRAN) injection 4 mg (has no administration in time range)  famotidine (PEPCID) IVPB 20 mg premix (20 mg Intravenous New Bag/Given 01/13/19 0355)  hydrALAZINE (APRESOLINE) injection 5 mg (has no administration in time range)  cefTRIAXone (ROCEPHIN) 2 g in sodium chloride 0.9 % 100 mL IVPB (has no administration in time range)  cefTRIAXone (ROCEPHIN) 1 g in sodium chloride 0.9 % 100 mL IVPB (1 g Intravenous New Bag/Given 01/13/19 0619)  sodium chloride flush (NS) 0.9 % injection 3 mL (3 mLs Intravenous Given 01/13/19 0356)  fentaNYL (SUBLIMAZE) injection 100 mcg (100 mcg Intravenous Given 01/13/19 0006)  ondansetron (ZOFRAN) injection 4 mg (4 mg Intravenous Given 01/13/19 0010)  lactated ringers bolus 1,000 mL (0 mLs Intravenous Stopped 01/13/19 0154)  iohexol (OMNIPAQUE) 350 MG/ML injection 100 mL (100 mLs Intravenous Contrast Given 01/13/19 0044)  HYDROmorphone (DILAUDID) injection 1 mg (1 mg Intravenous Given 01/13/19 0117)  lactated ringers bolus 1,000 mL (0 mLs Intravenous Stopped 01/13/19 0219)  cefTRIAXone (ROCEPHIN) 1 g in sodium chloride 0.9 % 100 mL IVPB (1 g Intravenous New Bag/Given 01/13/19 0154)     NEW OUTPATIENT MEDICATIONS STARTED DURING THIS VISIT:  Current Discharge Medication List      Note:  This note was prepared with assistance of Dragon voice recognition software. Occasional wrong-word or sound-a-like substitutions may have occurred due to  the inherent limitations of voice recognition software.   Omaree Fuqua, Corene Cornea, MD 01/13/19 [phone]

## 2019-01-13 NOTE — Progress Notes (Signed)
[facility]  Patient doing fair. Going to ambulate and eat. If tolerates diet, has pain control, and comes off O2 then he can go home. RN aware. Rx sent to [facility].  Curlene Labrum, MD [facility] [address] [place], [place] [address] [phone] (office)

## 2019-01-13 NOTE — Consult Note (Signed)
[facility] Consult  Reason for Consult: Acute Cholecystitis  Referring Physician:  Dr. Dayna Barker   Chief Complaint    Chest Pain      CUTBERTO WINFREE is a 68 y.o. male.  HPI: Mr. Wessels is a 68 yo who had an acute onset of abdominal pain in the RUQ that started at 8pm last night and associated nausea/vomiting. He says that he had a similar episode a few weeks ago but this episode has been a lot worse. He says that the pain is chronic and radiates into his lateral chest wall. He describes the pain as sharp. He has been having dry heaves in the hospital. He has never had any surgery.  He gets some minimal relief with the Fentanyl he is ordered but this does not last very long. He needs more pain medication now.   Past Medical History:  Diagnosis Date  . Bronchitis   . Diabetes mellitus without complication ([place])   . GERD (gastroesophageal reflux disease)   . High cholesterol   . Hypertension   . Pleurisy   . Pneumonia     History reviewed. No pertinent surgical history.  Family History  Problem Relation Age of Onset  . Asthma Mother   . Leukemia Mother   . Rheum arthritis Mother   . Rheum arthritis Maternal Grandmother     Social History   Tobacco Use  . Smoking status: Former Smoker    Packs/day: 2.00    Years: 30.00    Pack years: 60.00    Types: Cigarettes    Last attempt to quit: 09/02/1976    Years since quitting: 42.3  . Smokeless tobacco: Former Systems developer    Types: Chew    Quit date: 09/03/2003  Substance Use Topics  . Alcohol use: No  . Drug use: No    Medications:  I have reviewed the patient's current medications. Prior to Admission:  Medications Prior to Admission  Medication Sig Dispense Refill Last Dose  . albuterol (PROVENTIL HFA;VENTOLIN HFA) 108 (90 BASE) MCG/ACT inhaler Inhale 2 puffs into the lungs every 4 (four) hours as needed for wheezing or shortness of breath. 1 Inhaler 1 Taking  . albuterol (PROVENTIL) (2.5 MG/3ML) 0.083%  nebulizer solution Take 3 mLs (2.5 mg total) by nebulization every 6 (six) hours as needed for wheezing or shortness of breath. 75 mL 12 Taking  . ALPRAZolam (XANAX) 0.5 MG tablet Take 1 tablet by mouth at bedtime as needed. anxiety   Taking  . aspirin EC 81 MG tablet Take 81 mg by mouth daily.   Taking  . benzonatate (TESSALON) 100 MG capsule Take 1 capsule (100 mg total) by mouth every 8 (eight) hours. 21 capsule 0   . BYSTOLIC 5 MG tablet TAKE (1) TABLET BY MOUTH ONCE DAILY. 30 tablet 2   . chlorpheniramine (CHLORPHEN) 4 MG tablet Take 1 tablet (4 mg total) by mouth at bedtime as needed for allergies.  0 Taking  . doxycycline (VIBRAMYCIN) 100 MG capsule Take 1 capsule (100 mg total) by mouth 2 (two) times daily. 20 capsule 0   . famotidine (PEPCID) 20 MG tablet One at bedtime 30 tablet 2 Taking  . GLIPIZIDE XL 5 MG 24 hr tablet Take 1 tablet by mouth daily.   Taking  . Multiple Vitamins-Minerals (MULTIVITAMINS THER. W/MINERALS) TABS tablet Take 1 tablet by mouth daily.   Taking  . pantoprazole (PROTONIX) 40 MG tablet Take 1 tablet (40 mg total) by mouth 2 (two) times daily before  a meal. 60 tablet 0 Taking  . pravastatin (PRAVACHOL) 40 MG tablet Take 1 tablet by mouth daily.   Taking  . predniSONE (DELTASONE) 50 MG tablet 1 tablet PO daily 5 tablet 0    Scheduled: . insulin aspart  0-9 Units Subcutaneous Q4H   Continuous: . sodium chloride 85 mL/hr at 01/13/19 0354  . [START ON 01/14/2019] cefTRIAXone (ROCEPHIN)  IV    . famotidine (PEPCID) IV 20 mg (01/13/19 0355)  . metronidazole 500 mg (01/13/19 0430)   RKY:[id number] **OR** acetaminophen, albuterol, hydrALAZINE, morphine injection, ondansetron **OR** ondansetron (ZOFRAN) IV  No Known Allergies   ROS:  A comprehensive review of systems was negative except for: Gastrointestinal: positive for abdominal pain, nausea and vomiting  Blood pressure (!) 155/68, pulse 91, temperature 98 F (36.7 C), temperature source Oral, resp. rate  18, height 6' (1.829 m), weight 105.8 kg, SpO2 92 %. Physical Exam Vitals signs reviewed.  Constitutional:      Appearance: He is well-developed.  HENT:     Head: Normocephalic.  Eyes:     Pupils: Pupils are equal, round, and reactive to light.  Cardiovascular:     Rate and Rhythm: Normal rate.  Pulmonary:     Effort: Pulmonary effort is normal.  Abdominal:     General: There is distension.     Palpations: Abdomen is soft.     Tenderness: There is abdominal tenderness in the right upper quadrant and epigastric area. There is no guarding or rebound.  Musculoskeletal: Normal range of motion.     Right lower leg: No edema.  Skin:    General: Skin is warm and dry.  Neurological:     General: No focal deficit present.     Mental Status: He is alert and oriented to person, place, and time.  Psychiatric:        Mood and Affect: Mood normal.        Behavior: Behavior normal.     Results: Results for orders placed or performed during the hospital encounter of 01/12/19 (from the past 48 hour(s))  Basic metabolic panel     Status: Abnormal   Collection Time: 01/12/19 11:49 PM  Result Value Ref Range   Sodium 140 135 - 145 mmol/L   Potassium 4.2 3.5 - 5.1 mmol/L   Chloride 104 98 - 111 mmol/L   CO2 22 22 - 32 mmol/L   Glucose, Bld 253 (H) 70 - 99 mg/dL   BUN 17 8 - 23 mg/dL   Creatinine, Ser 0.98 0.61 - 1.24 mg/dL   Calcium 8.8 (L) 8.9 - 10.3 mg/dL   GFR calc non Af Amer >60 >60 mL/min   GFR calc Af Amer >60 >60 mL/min   Anion gap 14 5 - 15    Comment: Performed at [facility], [address] [place], [place] [address]  CBC     Status: Abnormal   Collection Time: 01/12/19 11:49 PM  Result Value Ref Range   WBC 12.5 (H) 4.0 - 10.5 K/uL   RBC 4.05 (L) 4.22 - 5.81 MIL/uL   Hemoglobin 12.9 (L) 13.0 - 17.0 g/dL   HCT 38.0 (L) 39.0 - 52.0 %   MCV 93.8 80.0 - 100.0 fL   MCH 31.9 26.0 - 34.0 pg   MCHC 33.9 30.0 - 36.0 g/dL   RDW 14.6 11.5 - 15.5 %   Platelets 311 150 - 400 K/uL    nRBC 0.0 0.0 - 0.2 %    Comment: Performed at [facility], [address] [place], [place] [address]  Troponin I - ONCE - STAT     Status: None   Collection Time: 01/12/19 11:49 PM  Result Value Ref Range   Troponin I <0.03 <0.03 ng/mL    Comment: Performed at [facility], [address], [place], [place] [address]  Hepatic function panel     Status: None   Collection Time: 01/12/19 11:49 PM  Result Value Ref Range   Total Protein 7.8 6.5 - 8.1 g/dL   Albumin 4.4 3.5 - 5.0 g/dL   AST 29 15 - 41 U/L   ALT 35 0 - 44 U/L   Alkaline Phosphatase 71 38 - 126 U/L   Total Bilirubin 0.9 0.3 - 1.2 mg/dL   Bilirubin, Direct 0.2 0.0 - 0.2 mg/dL   Indirect Bilirubin 0.7 0.3 - 0.9 mg/dL    Comment: Performed at [facility], [address] [place], [place] [address]  Lipase, blood     Status: None   Collection Time: 01/12/19 11:49 PM  Result Value Ref Range   Lipase 48 11 - 51 U/L    Comment: Performed at [facility], [address] [place], [place] [address]  Urinalysis, Routine w reflex microscopic     Status: Abnormal   Collection Time: 01/13/19  1:56 AM  Result Value Ref Range   Color, Urine YELLOW YELLOW   APPearance CLEAR CLEAR   Specific Gravity, Urine >1.046 (H) 1.005 - 1.030   pH 5.0 5.0 - 8.0   Glucose, UA >=500 (A) NEGATIVE mg/dL   Hgb urine dipstick NEGATIVE NEGATIVE   Bilirubin Urine NEGATIVE NEGATIVE   Ketones, ur NEGATIVE NEGATIVE mg/dL   Protein, ur NEGATIVE NEGATIVE mg/dL   Nitrite NEGATIVE NEGATIVE   Leukocytes,Ua NEGATIVE NEGATIVE   RBC / HPF 0-5 0 - 5 RBC/hpf   WBC, UA 0-5 0 - 5 WBC/hpf   Bacteria, UA NONE SEEN NONE SEEN   Squamous Epithelial / LPF 0-5 0 - 5   Mucus PRESENT     Comment: Performed at [facility], [address] [place], [place] [address]  SARS Coronavirus 2 (CEPHEID - Performed in [place] hospital lab), Hosp Order     Status: None   Collection Time: 01/13/19  2:10 AM  Result Value Ref Range   SARS Coronavirus 2 NEGATIVE NEGATIVE     Comment: (NOTE) If result is NEGATIVE SARS-CoV-2 target nucleic acids are NOT DETECTED. The SARS-CoV-2 RNA is generally detectable in upper and lower  respiratory specimens during the acute phase of infection. The lowest  concentration of SARS-CoV-2 viral copies this assay can detect is 250  copies / mL. A negative result does not preclude SARS-CoV-2 infection  and should not be used as the sole basis for treatment or other  patient management decisions.  A negative result may occur with  improper specimen collection / handling, submission of specimen other  than nasopharyngeal swab, presence of viral mutation(s) within the  areas targeted by this assay, and inadequate number of viral copies  (<250 copies / mL). A negative result must be combined with clinical  observations, patient history, and epidemiological information. If result is POSITIVE SARS-CoV-2 target nucleic acids are DETECTED. The SARS-CoV-2 RNA is generally detectable in upper and lower  respiratory specimens dur ing the acute phase of infection.  Positive  results are indicative of active infection with SARS-CoV-2.  Clinical  correlation with patient history and other diagnostic information is  necessary to determine patient infection status.  Positive  results do  not rule out bacterial infection or co-infection with other viruses. If result is PRESUMPTIVE POSTIVE SARS-CoV-2 nucleic acids MAY BE PRESENT.   A presumptive positive result was obtained on the submitted specimen  and confirmed on repeat testing.  While 2019 novel coronavirus  (SARS-CoV-2) nucleic acids may be present in the submitted sample  additional confirmatory testing may be necessary for epidemiological  and / or clinical management purposes  to differentiate between  SARS-CoV-2 and other Sarbecovirus currently known to infect humans.  If clinically indicated additional testing with an alternate test  methodology [phone]) is advised. The SARS-CoV-2  RNA is generally  detectable in upper and lower respiratory sp ecimens during the acute  phase of infection. The expected result is Negative. Fact Sheet for Patients:  StrictlyIdeas.no Fact Sheet for Healthcare Providers: BankingDealers.co.za This test is not yet approved or cleared by the [place] FDA and has been authorized for detection and/or diagnosis of SARS-CoV-2 by FDA under an Emergency Use Authorization (EUA).  This EUA will remain in effect (meaning this test can be used) for the duration of the COVID-19 declaration under Section 564(b)(1) of the Act, 21 U.S.C. section 360bbb-3(b)(1), unless the authorization is terminated or revoked sooner. Performed at [facility], [address], [place], [place] [address]   Glucose, capillary     Status: Abnormal   Collection Time: 01/13/19  3:49 AM  Result Value Ref Range   Glucose-Capillary 219 (H) 70 - 99 mg/dL  Comprehensive metabolic panel     Status: Abnormal   Collection Time: 01/13/19  5:05 AM  Result Value Ref Range   Sodium 140 135 - 145 mmol/L   Potassium 4.6 3.5 - 5.1 mmol/L   Chloride 104 98 - 111 mmol/L   CO2 22 22 - 32 mmol/L   Glucose, Bld 235 (H) 70 - 99 mg/dL   BUN 15 8 - 23 mg/dL   Creatinine, Ser 0.87 0.61 - 1.24 mg/dL   Calcium 9.2 8.9 - 10.3 mg/dL   Total Protein 8.6 (H) 6.5 - 8.1 g/dL   Albumin 4.7 3.5 - 5.0 g/dL   AST 36 15 - 41 U/L   ALT 42 0 - 44 U/L   Alkaline Phosphatase 75 38 - 126 U/L   Total Bilirubin 1.4 (H) 0.3 - 1.2 mg/dL   GFR calc non Af Amer >60 >60 mL/min   GFR calc Af Amer >60 >60 mL/min   Anion gap 14 5 - 15    Comment: Performed at [facility], [address], [place], [place] [address]  Lipase, blood     Status: None   Collection Time: 01/13/19  5:05 AM  Result Value Ref Range   Lipase 30 11 - 51 U/L    Comment: Performed at [facility], [address] [place],  [address]  CBC WITH DIFFERENTIAL     Status: Abnormal    Collection Time: 01/13/19  5:05 AM  Result Value Ref Range   WBC 15.3 (H) 4.0 - 10.5 K/uL   RBC 4.29 4.22 - 5.81 MIL/uL   Hemoglobin 13.5 13.0 - 17.0 g/dL   HCT 41.5 39.0 - 52.0 %   MCV 96.7 80.0 - 100.0 fL   MCH 31.5 26.0 - 34.0 pg   MCHC 32.5 30.0 - 36.0 g/dL   RDW 14.6 11.5 - 15.5 %   Platelets 314 150 - 400 K/uL   nRBC 0.0 0.0 - 0.2 %   Neutrophils Relative % 89 %   Neutro Abs  13.5 (H) 1.7 - 7.7 K/uL   Lymphocytes Relative 6 %   Lymphs Abs 0.9 0.7 - 4.0 K/uL   Monocytes Relative 5 %   Monocytes Absolute 0.7 0.1 - 1.0 K/uL   Eosinophils Relative 0 %   Eosinophils Absolute 0.0 0.0 - 0.5 K/uL   Basophils Relative 0 %   Basophils Absolute 0.0 0.0 - 0.1 K/uL   Immature Granulocytes 0 %   Abs Immature Granulocytes 0.06 0.00 - 0.07 K/uL    Comment: Performed at [facility], [address] [place], [place] [address]  Glucose, capillary     Status: Abnormal   Collection Time: 01/13/19  7:20 AM  Result Value Ref Range   Glucose-Capillary 221 (H) 70 - 99 mg/dL   Imaging reviewed- CT with distended gallbladder with stone at the neck, no biliary dilation, constipated  Dg Abdomen 1 View  Result Date: 01/13/2019 CLINICAL DATA:  Shortness of breath chest pain EXAM: ABDOMEN - 1 VIEW COMPARISON:  None. FINDINGS: The bowel gas pattern is normal. No radio-opaque calculi or other significant radiographic abnormality are seen. No free air. IMPRESSION: No free air. Electronically Signed   By: Ulyses Jarred M.D.   On: 01/13/2019 00:33   Dg Chest Port 1 View  Result Date: 01/13/2019 CLINICAL DATA:  Chest pain and shortness of breath EXAM: PORTABLE CHEST 1 VIEW COMPARISON:  04/28/2018 FINDINGS: The heart size and mediastinal contours are within normal limits. Both lungs are clear aside from unchanged right basilar atelectasis. The visualized skeletal structures are unremarkable. IMPRESSION: No acute airspace disease. Electronically Signed   By: Ulyses Jarred M.D.   On: 01/13/2019 00:32   Ct Angio  Chest/abd/pel For Dissection W And/or Wo Contrast  Result Date: 01/13/2019 CLINICAL DATA:  Chest pain and right-sided flank pain.  Diaphoresis. EXAM: CT ANGIOGRAPHY CHEST, ABDOMEN AND PELVIS TECHNIQUE: Multidetector CT imaging through the chest, abdomen and pelvis was performed using the standard protocol during bolus administration of intravenous contrast. Multiplanar reconstructed images and MIPs were obtained and reviewed to evaluate the vascular anatomy. CONTRAST:  150mL OMNIPAQUE IOHEXOL 350 MG/ML SOLN COMPARISON:  CTA chest 01/27/2014 FINDINGS: CTA CHEST FINDINGS Cardiovascular: --Heart: The heart size is normal.  There is nopericardial effusion. --Aorta: The course and caliber of the thoracic aorta are normal. There is no aortic atherosclerotic calcification. Precontrast images show no aortic intramural hematoma. There is no blood pool, dissection or penetrating ulcer demonstrated on arterial phase postcontrast imaging. There is a conventional 3 vessel aortic arch branching pattern. The proximal arch vessels are widely patent. --Pulmonary Arteries: Contrast timing is optimized for preferential opacification of the aorta. Within that limitation, normal central pulmonary arteries. Mediastinum/Nodes: No mediastinal, hilar or axillary lymphadenopathy. The visualized thyroid and thoracic esophageal course are unremarkable. Lungs/Pleura: No pulmonary nodules or masses. No pleural effusion or pneumothorax. No focal airspace consolidation. No focal pleural abnormality. Musculoskeletal: No chest wall abnormality. No acute osseous findings. Review of the MIP images confirms the above findings. CTA ABDOMEN AND PELVIS FINDINGS VASCULAR Aorta: Normal caliber aorta without aneurysm, dissection, vasculitis or hemodynamically significant stenosis. There is no aortic atherosclerosis. Celiac: No aneurysm, dissection or hemodynamically significant stenosis. SMA: Widely patent without dissection or stenosis. Replaced right  hepatic artery. Renals: Paired renal arteries bilaterally. No aneurysm, dissection, stenosis or evidence of fibromuscular dysplasia. IMA: Patent without abnormality. Inflow: No aneurysm, stenosis or dissection. Veins: Normal course and caliber of the major veins. Assessment is otherwise limited by the arterial dominant contrast phase. Review of the MIP images  confirms the above findings. NON-VASCULAR Hepatobiliary: Normal hepatic contours and density. No visible biliary dilatation. There is a stone at the gallbladder neck. The gallbladder is distended. There is mild enhancement at the gallbladder fossa. Pancreas: Normal contours without ductal dilatation. No peripancreatic fluid collection. Spleen: Normal arterial phase splenic enhancement pattern. Adrenals/Urinary Tract: --Adrenal glands: Normal. --Right kidney/ureter: No hydronephrosis or perinephric stranding. No nephrolithiasis. No obstructing ureteral stones. --Left kidney/ureter: No hydronephrosis or perinephric stranding. No nephrolithiasis. No obstructing ureteral stones. --Urinary bladder: Unremarkable. Stomach/Bowel: --Stomach/Duodenum: No hiatal hernia or other gastric abnormality. Normal duodenal course and caliber. --Small bowel: No dilatation or inflammation. --Colon: No focal abnormality. --Appendix: Normal. Lymphatic:  No abdominal or pelvic lymphadenopathy. Reproductive: Normal prostate and seminal vesicles. Musculoskeletal. No bony spinal canal stenosis or focal osseous abnormality. Other: None. Review of the MIP images confirms the above findings. IMPRESSION: 1. No acute aortic syndrome. 2. Stone at the gallbladder neck with distended gallbladder and trace enhancement at the gallbladder fossa, likely indicating acute cholecystitis. Right upper quadrant ultrasound is recommended. Electronically Signed   By: Ulyses Jarred M.D.   On: 01/13/2019 01:29    Assessment & Plan:  Iroquois WENZLICK is a 68 y.o. male with acute cholecystitis based on his  presentation and CT with distention. No signs of biliary obstruction and LFTs wnl.   -PLAN: I counseled the patient about the indication, risks and benefits of laparoscopic cholecystectomy.  He understands there is a very small chance for bleeding, infection, injury to normal structures (including common bile duct), conversion to open surgery, persistent symptoms, evolution of postcholecystectomy diarrhea, need for secondary interventions, anesthesia reaction, cardiopulmonary issues and other risks not specifically detailed here. I described the expected recovery, the plan for follow-up and the restrictions during the recovery phase.  All questions were answered.  -Called and updated his wife during the COVID 19 pandemic. Patient is COVID 19 negative and denies any recent cough, SOB, fever.   All questions were answered to the satisfaction of the patient and family.  After careful consideration, ULISES WOLFINGER has decided to proceed.    Virl Cagey 01/13/2019, 7:34 AM

## 2019-01-13 NOTE — ED Notes (Signed)
Pt vomited scant amount of bile.

## 2019-01-14 ENCOUNTER — Encounter (HOSPITAL_COMMUNITY): Payer: Self-pay | Admitting: General Surgery

## 2019-01-14 DIAGNOSIS — K8012 Calculus of gallbladder with acute and chronic cholecystitis without obstruction: Secondary | ICD-10-CM | POA: Diagnosis not present

## 2019-01-14 LAB — GLUCOSE, CAPILLARY
Glucose-Capillary: 187 mg/dL — ABNORMAL HIGH (ref 70–99)
Glucose-Capillary: 163 mg/dL — ABNORMAL HIGH (ref 70–99)
Glucose-Capillary: 187 mg/dL — ABNORMAL HIGH (ref 70–99)

## 2019-01-14 LAB — HIV ANTIBODY (ROUTINE TESTING W REFLEX): HIV Screen 4th Generation wRfx: NONREACTIVE

## 2019-01-14 MED ORDER — INSULIN ASPART 100 UNIT/ML ~~LOC~~ SOLN
0.0000 [IU] | Freq: Three times a day (TID) | SUBCUTANEOUS | Status: DC
  Administered 2019-01-14 (×2): 2 [IU] via SUBCUTANEOUS

## 2019-01-14 MED ORDER — METFORMIN HCL ER 500 MG PO TB24: 1000.0000 mg | ORAL_TABLET | Freq: Two times a day (BID) | ORAL | Status: AC

## 2019-01-14 MED ORDER — GLIPIZIDE ER 5 MG PO TB24
10.0000 mg | ORAL_TABLET | Freq: Two times a day (BID) | ORAL | Status: DC
  Administered 2019-01-14: 10:00:00 10 mg via ORAL
  Filled 2019-01-14: qty 2

## 2019-01-14 NOTE — Progress Notes (Signed)
[facility]  Have resumed Glucotrol today as CBGs were elevated yesterday evening. I have also changed the SSI to TID [facility].   Debbe Odea, MD

## 2019-01-14 NOTE — Discharge Instructions (Signed)
Discharge Laparoscopic Surgery Instructions:  Common Complaints: Right shoulder pain is common after laparoscopic surgery. This is secondary to the gas used in the surgery being trapped under the diaphragm.  Walk to help your body absorb the gas. This will improve in a few days. Pain at the port sites are common, especially the larger port sites. This will improve with time.  Some nausea is common and poor appetite. The main goal is to stay hydrated the first few days after surgery.  Do not restart metformin until Saturday. Take your glipizide normally and monitor your blood sugars.  Diet/ Activity: Diet as tolerated. You may not have an appetite, but it is important to stay hydrated. Drink 64 ounces of water a day. Your appetite will return with time.  Shower per your regular routine daily.  Do not take hot showers. Take warm showers that are less than 10 minutes. Rest and listen to your body, but do not remain in bed all day.  Walk everyday for at least 15-20 minutes. Deep cough and move around every 1-2 hours in the first few days after surgery.  Do not lift > 10 lbs, perform excessive bending, pushing, pulling, squatting for 1-2 weeks after surgery.  Do not pick at the dermabond glue on your incision sites.  This glue film will remain in place for 1-2 weeks and will start to peel off.  Do not place lotions or balms on your incision unless instructed to specifically by Dr. Constance Haw.   Medication: Take tylenol and ibuprofen as needed for pain control, alternating every 4-6 hours.  Example:  Tylenol 1000mg  @ 6am, 12noon, 6pm, 44midnight (Do not exceed 4000mg  of tylenol a day). Ibuprofen 800mg  @ 9am, 3pm, 9pm, 3am (Do not exceed 3600mg  of ibuprofen a day).  Take Roxicodone for breakthrough pain every 4 hours.  Take Colace for constipation related to narcotic pain medication. If you do not have a bowel movement in 2 days, take Miralax over the counter.  Drink plenty of water to also prevent  constipation.   Contact Information: If you have questions or concerns, please call our office, [phone], Monday- Thursday 8AM-5PM and Friday 8AM-12Noon.  If it is after hours or on the weekend, please call [facility], [phone], and ask to speak to the surgeon on call for Dr. Constance Haw at [facility].    Laparoscopic Cholecystectomy, Care After This sheet gives you information about how to care for yourself after your procedure. Your doctor may also give you more specific instructions. If you have problems or questions, contact your doctor. Follow these instructions at home: Care for cuts from surgery (incisions)   Follow instructions from your doctor about how to take care of your cuts from surgery. Make sure you: ? Wash your hands with soap and water before you change your bandage (dressing). If you cannot use soap and water, use hand sanitizer. ? Change your bandage as told by your doctor. ? Leave stitches (sutures), skin glue, or skin tape (adhesive) strips in place. They may need to stay in place for 2 weeks or longer. If tape strips get loose and curl up, you may trim the loose edges. Do not remove tape strips completely unless your doctor says it is okay.  Do not take baths, swim, or use a hot tub until your doctor says it is okay.   You may shower.  Check your surgical cut area every day for signs of infection. Check for: ? More redness, swelling, or pain. ?  More fluid or blood. ? Warmth. ? Pus or a bad smell. Activity  Do not drive or use heavy machinery while taking prescription pain medicine.  Do not lift anything that is heavier than 10 lb (4.5 kg) until your doctor says it is okay.  Do not play contact sports until your doctor says it is okay.  Do not drive for 24 hours if you were given a medicine to help you relax (sedative).  Rest as needed. Do not return to work or school until your doctor says it is okay. General instructions  Take  over-the-counter and prescription medicines only as told by your doctor.  To prevent or treat constipation while you are taking prescription pain medicine, your doctor may recommend that you: ? Drink enough fluid to keep your pee (urine) clear or pale yellow. ? Take over-the-counter or prescription medicines. ? Eat foods that are high in fiber, such as fresh fruits and vegetables, whole grains, and beans. ? Limit foods that are high in fat and processed sugars, such as fried and sweet foods. Contact a doctor if:  You develop a rash.  You have more redness, swelling, or pain around your surgical cuts.  You have more fluid or blood coming from your surgical cuts.  Your surgical cuts feel warm to the touch.  You have pus or a bad smell coming from your surgical cuts.  You have a fever.  One or more of your surgical cuts breaks open. Get help right away if:  You have trouble breathing.  You have chest pain.  You have pain that is getting worse in your shoulders.  You faint or feel dizzy when you stand.  You have very bad pain in your belly (abdomen).  You are sick to your stomach (nauseous) for more than one day.  You have throwing up (vomiting) that lasts for more than one day.  You have leg pain. This information is not intended to replace advice given to you by your health care provider. Make sure you discuss any questions you have with your health care provider. Document Released: 05/28/2008 Document Revised: 03/09/2016 Document Reviewed: 02/05/2016 Elsevier Interactive Patient Education  2019 [employer].

## 2019-01-14 NOTE — Progress Notes (Signed)
Discharge instructions given on medications and follow up visits,patient verbalized understanding. Prescriptions sent to Pharmacy of choice documented on AVS. Accompanied by staff to an awaiting vehicle. 

## 2019-01-21 DIAGNOSIS — Z0001 Encounter for general adult medical examination with abnormal findings: Secondary | ICD-10-CM | POA: Diagnosis not present

## 2019-01-21 DIAGNOSIS — K810 Acute cholecystitis: Secondary | ICD-10-CM | POA: Diagnosis not present

## 2019-01-21 DIAGNOSIS — E1165 Type 2 diabetes mellitus with hyperglycemia: Secondary | ICD-10-CM | POA: Diagnosis not present

## 2019-01-21 DIAGNOSIS — R070 Pain in throat: Secondary | ICD-10-CM | POA: Diagnosis not present

## 2019-01-21 DIAGNOSIS — R1084 Generalized abdominal pain: Secondary | ICD-10-CM | POA: Diagnosis not present

## 2019-01-26 ENCOUNTER — Ambulatory Visit: Payer: Self-pay | Admitting: General Surgery

## 2019-01-26 ENCOUNTER — Other Ambulatory Visit: Payer: Self-pay

## 2019-01-26 ENCOUNTER — Telehealth: Payer: Self-pay | Admitting: General Surgery

## 2019-01-26 NOTE — Telephone Encounter (Signed)
[facility]  I am calling the patient for post operative evaluation due to the current COVID 19 pandemic.  The patient had a 01/13/2019 on laparoscopic cholecystectomy. The patient reports that they are doing great. He was having some pain and soreness but is perfect now. The are tolerating a diet, having good pain control, and having regular Bms.  The patient has no concerns. Plans to start walking. Bruising at the incision but otherwise healing.   Will see the patient PRN.   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 - THERE IS NO EVIDENCE OF MALIGNANCY.  Curlene Labrum, MD [facility] [address] [place], [place] [address] [phone] (office)

## 2019-03-08 DIAGNOSIS — E1165 Type 2 diabetes mellitus with hyperglycemia: Secondary | ICD-10-CM | POA: Diagnosis not present

## 2019-03-08 DIAGNOSIS — E782 Mixed hyperlipidemia: Secondary | ICD-10-CM | POA: Diagnosis not present

## 2019-03-08 DIAGNOSIS — I10 Essential (primary) hypertension: Secondary | ICD-10-CM | POA: Diagnosis not present

## 2019-03-15 DIAGNOSIS — K219 Gastro-esophageal reflux disease without esophagitis: Secondary | ICD-10-CM | POA: Diagnosis not present

## 2019-03-15 DIAGNOSIS — E782 Mixed hyperlipidemia: Secondary | ICD-10-CM | POA: Diagnosis not present

## 2019-03-15 DIAGNOSIS — E1165 Type 2 diabetes mellitus with hyperglycemia: Secondary | ICD-10-CM | POA: Diagnosis not present

## 2019-03-15 DIAGNOSIS — R251 Tremor, unspecified: Secondary | ICD-10-CM | POA: Diagnosis not present

## 2019-03-15 DIAGNOSIS — F411 Generalized anxiety disorder: Secondary | ICD-10-CM | POA: Diagnosis not present

## 2019-03-15 DIAGNOSIS — I10 Essential (primary) hypertension: Secondary | ICD-10-CM | POA: Diagnosis not present

## 2019-03-31 ENCOUNTER — Other Ambulatory Visit: Payer: Self-pay

## 2019-06-08 DIAGNOSIS — G250 Essential tremor: Secondary | ICD-10-CM | POA: Diagnosis not present

## 2019-07-13 DIAGNOSIS — D3131 Benign neoplasm of right choroid: Secondary | ICD-10-CM | POA: Diagnosis not present

## 2019-07-13 DIAGNOSIS — H25813 Combined forms of age-related cataract, bilateral: Secondary | ICD-10-CM | POA: Diagnosis not present

## 2019-07-13 DIAGNOSIS — E119 Type 2 diabetes mellitus without complications: Secondary | ICD-10-CM | POA: Diagnosis not present

## 2019-07-20 DIAGNOSIS — F411 Generalized anxiety disorder: Secondary | ICD-10-CM | POA: Diagnosis not present

## 2019-07-20 DIAGNOSIS — E1165 Type 2 diabetes mellitus with hyperglycemia: Secondary | ICD-10-CM | POA: Diagnosis not present

## 2019-07-20 DIAGNOSIS — K219 Gastro-esophageal reflux disease without esophagitis: Secondary | ICD-10-CM | POA: Diagnosis not present

## 2019-07-20 DIAGNOSIS — E782 Mixed hyperlipidemia: Secondary | ICD-10-CM | POA: Diagnosis not present

## 2019-07-20 DIAGNOSIS — I10 Essential (primary) hypertension: Secondary | ICD-10-CM | POA: Diagnosis not present

## 2019-07-20 DIAGNOSIS — G250 Essential tremor: Secondary | ICD-10-CM | POA: Diagnosis not present

## 2019-09-09 DIAGNOSIS — K810 Acute cholecystitis: Secondary | ICD-10-CM | POA: Diagnosis not present

## 2019-09-09 DIAGNOSIS — R05 Cough: Secondary | ICD-10-CM | POA: Diagnosis not present

## 2019-09-09 DIAGNOSIS — I10 Essential (primary) hypertension: Secondary | ICD-10-CM | POA: Diagnosis not present

## 2019-09-09 DIAGNOSIS — Z809 Family history of malignant neoplasm, unspecified: Secondary | ICD-10-CM | POA: Diagnosis not present

## 2019-09-09 DIAGNOSIS — G250 Essential tremor: Secondary | ICD-10-CM | POA: Diagnosis not present

## 2019-09-09 DIAGNOSIS — Z6832 Body mass index (BMI) 32.0-32.9, adult: Secondary | ICD-10-CM | POA: Diagnosis not present

## 2019-09-09 DIAGNOSIS — R0600 Dyspnea, unspecified: Secondary | ICD-10-CM | POA: Diagnosis not present

## 2019-09-09 DIAGNOSIS — R251 Tremor, unspecified: Secondary | ICD-10-CM | POA: Diagnosis not present

## 2019-09-09 DIAGNOSIS — K219 Gastro-esophageal reflux disease without esophagitis: Secondary | ICD-10-CM | POA: Diagnosis not present

## 2019-09-09 DIAGNOSIS — E782 Mixed hyperlipidemia: Secondary | ICD-10-CM | POA: Diagnosis not present

## 2019-09-09 DIAGNOSIS — Z6830 Body mass index (BMI) 30.0-30.9, adult: Secondary | ICD-10-CM | POA: Diagnosis not present

## 2019-09-09 DIAGNOSIS — E1165 Type 2 diabetes mellitus with hyperglycemia: Secondary | ICD-10-CM | POA: Diagnosis not present

## 2019-09-28 DIAGNOSIS — R070 Pain in throat: Secondary | ICD-10-CM | POA: Diagnosis not present

## 2019-09-28 DIAGNOSIS — I10 Essential (primary) hypertension: Secondary | ICD-10-CM | POA: Diagnosis not present

## 2019-09-28 DIAGNOSIS — E1165 Type 2 diabetes mellitus with hyperglycemia: Secondary | ICD-10-CM | POA: Diagnosis not present

## 2019-09-28 DIAGNOSIS — Z0001 Encounter for general adult medical examination with abnormal findings: Secondary | ICD-10-CM | POA: Diagnosis not present

## 2019-09-28 DIAGNOSIS — K810 Acute cholecystitis: Secondary | ICD-10-CM | POA: Diagnosis not present

## 2019-09-28 DIAGNOSIS — E7849 Other hyperlipidemia: Secondary | ICD-10-CM | POA: Diagnosis not present

## 2019-09-28 DIAGNOSIS — R1084 Generalized abdominal pain: Secondary | ICD-10-CM | POA: Diagnosis not present

## 2020-01-07 IMAGING — CR PORTABLE CHEST - 1 VIEW
1 series · 1 of 1 positions shown · non-contrast
Comparison: 04/28/2018

CLINICAL DATA: Chest pain and shortness of breath

EXAM:
PORTABLE CHEST 1 VIEW

[pa]
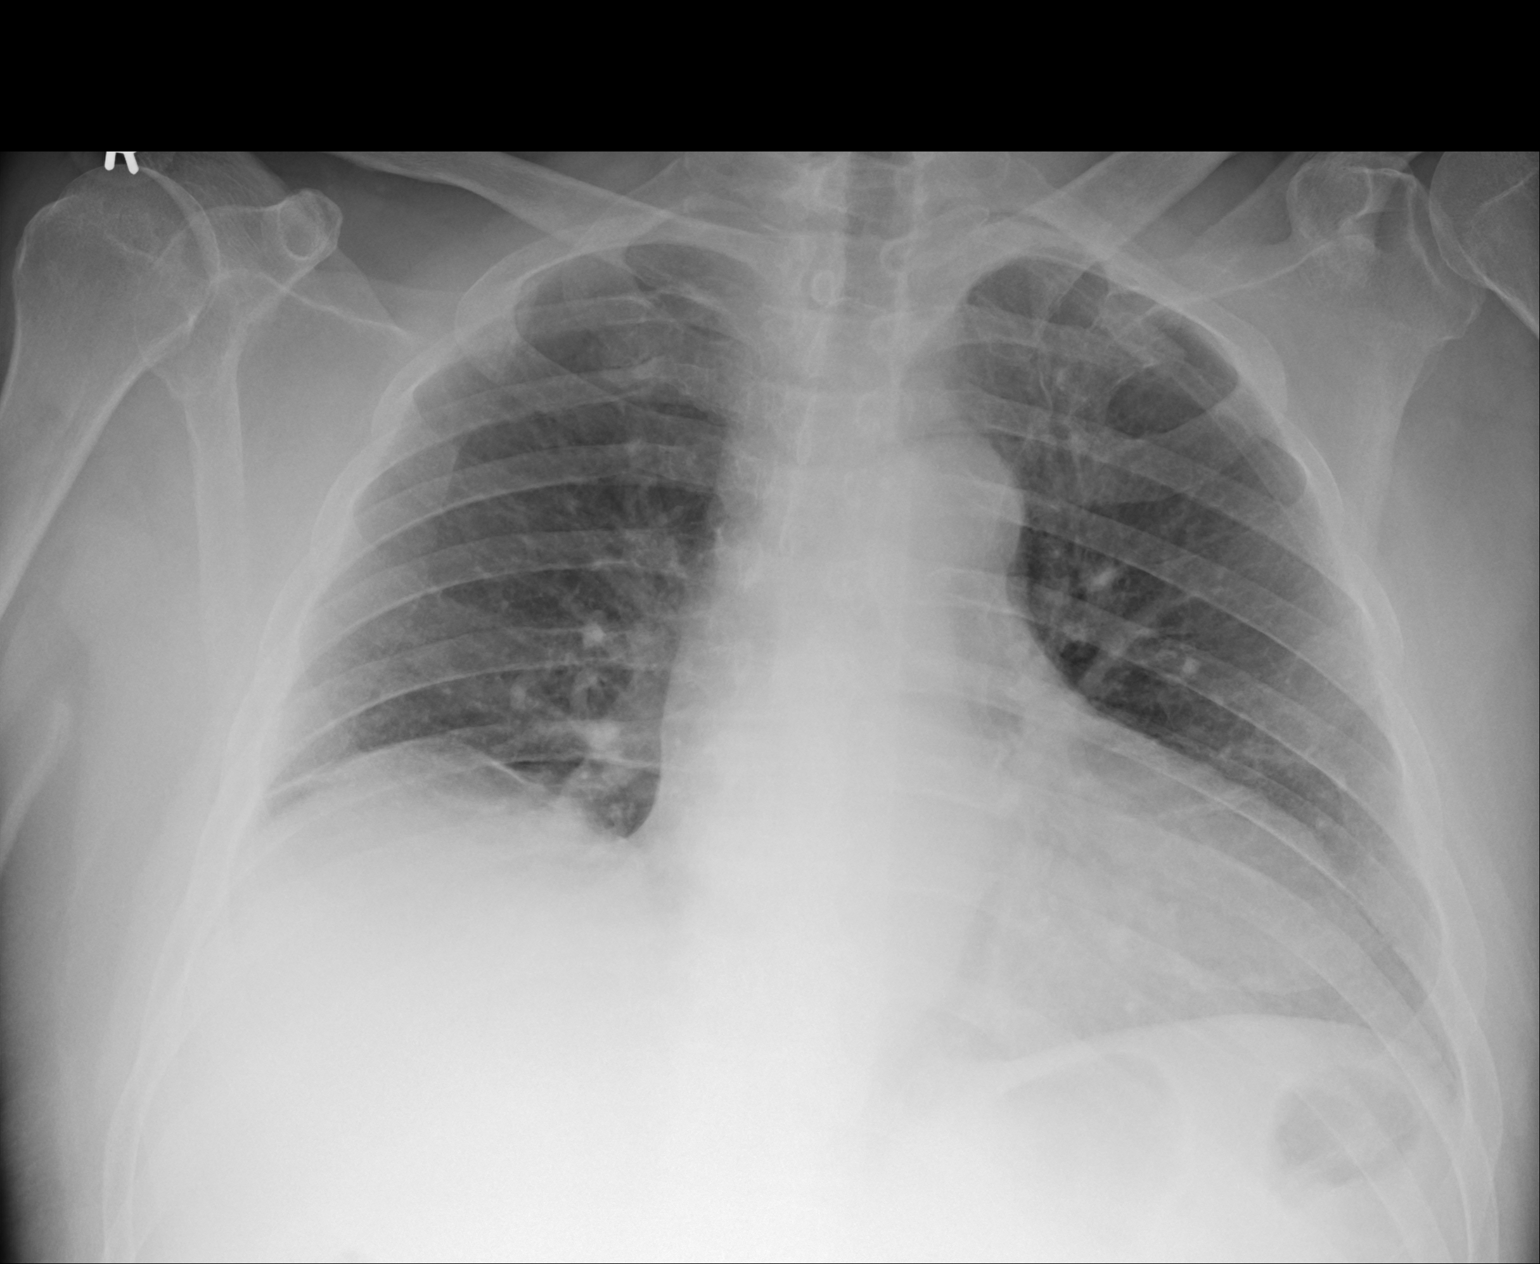

[1 of 1 positions shown; findings below may reference images not displayed]

FINDINGS: The heart size and mediastinal contours are within normal limits.
Both lungs are clear aside from unchanged right basilar atelectasis.
The visualized skeletal structures are unremarkable.
IMPRESSION: No acute airspace disease.

## 2020-01-08 IMAGING — CR ABDOMEN - 1 VIEW
1 series · 1 of 1 positions shown · non-contrast
Comparison: None.

CLINICAL DATA: Shortness of breath chest pain

EXAM:
ABDOMEN - 1 VIEW

[erect ap]
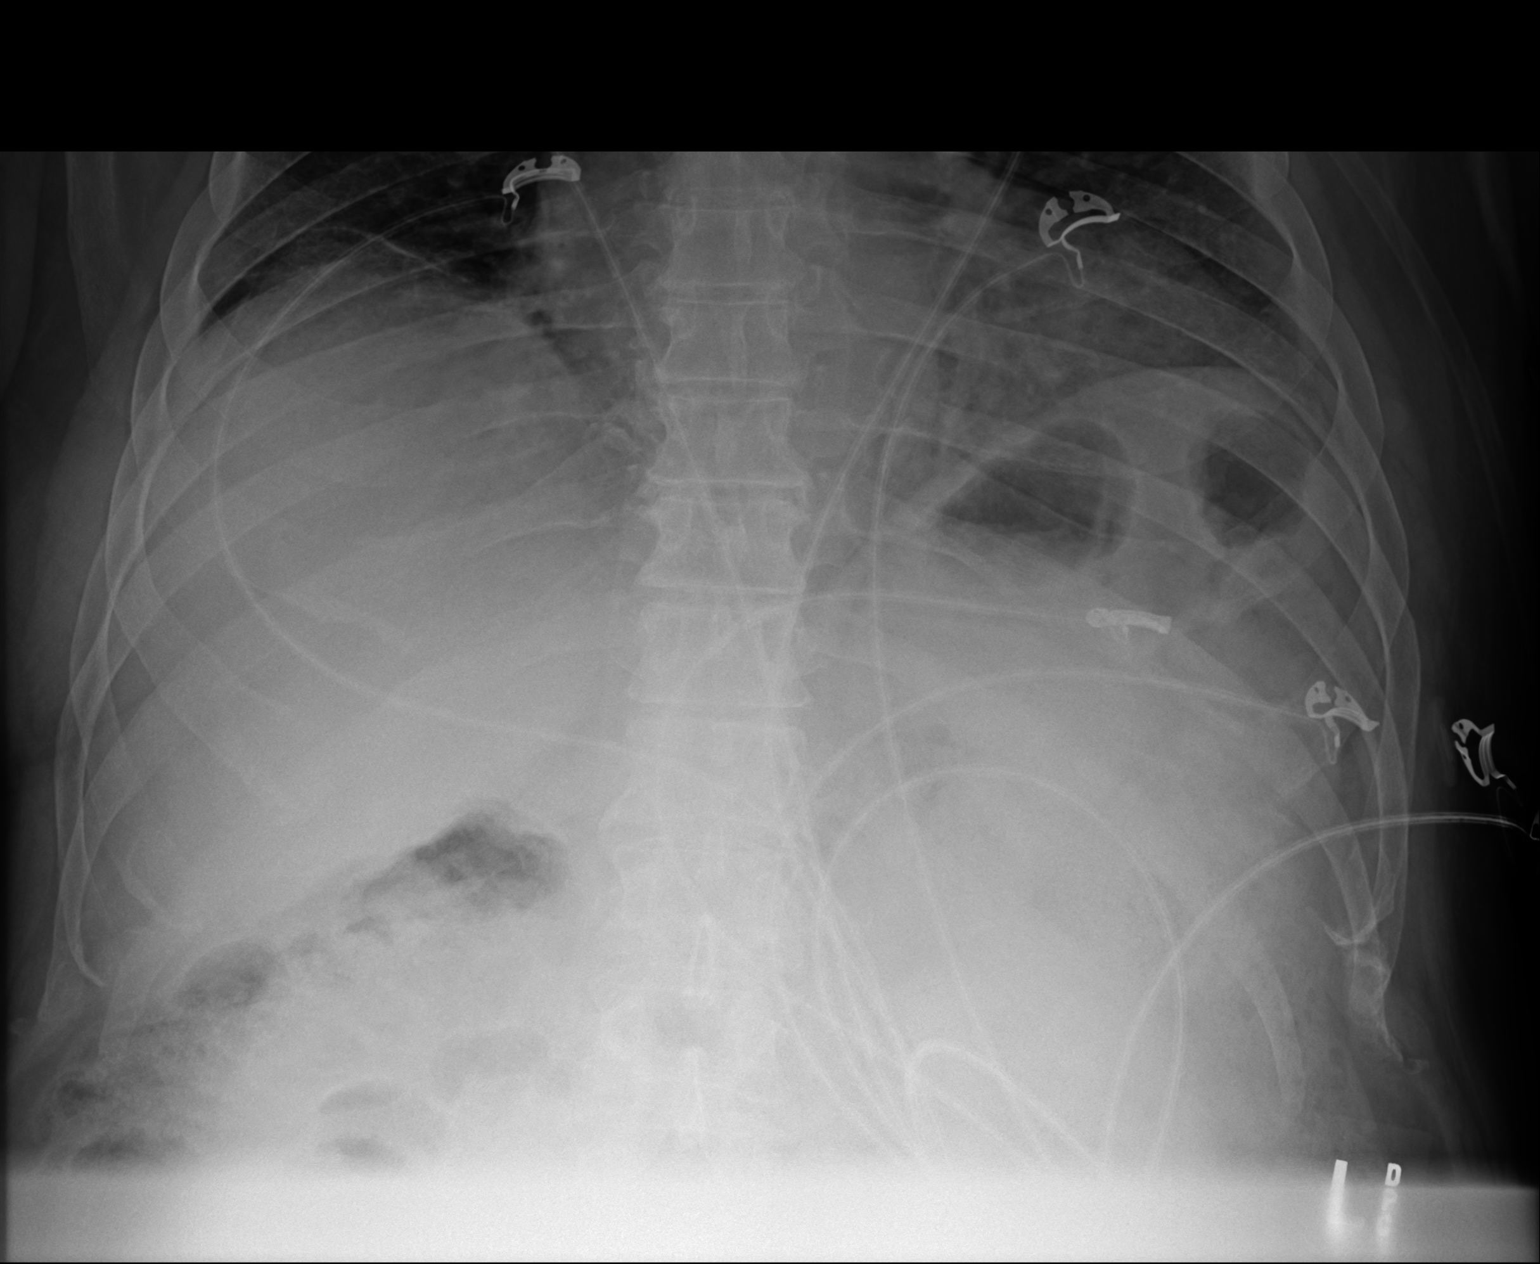

[1 of 1 positions shown; findings below may reference images not displayed]

FINDINGS: The bowel gas pattern is normal. No radio-opaque calculi or other
significant radiographic abnormality are seen. No free air.
IMPRESSION: No free air.

## 2020-01-24 DIAGNOSIS — K219 Gastro-esophageal reflux disease without esophagitis: Secondary | ICD-10-CM | POA: Diagnosis not present

## 2020-01-24 DIAGNOSIS — E782 Mixed hyperlipidemia: Secondary | ICD-10-CM | POA: Diagnosis not present

## 2020-01-24 DIAGNOSIS — G250 Essential tremor: Secondary | ICD-10-CM | POA: Diagnosis not present

## 2020-01-24 DIAGNOSIS — I10 Essential (primary) hypertension: Secondary | ICD-10-CM | POA: Diagnosis not present

## 2020-01-24 DIAGNOSIS — F411 Generalized anxiety disorder: Secondary | ICD-10-CM | POA: Diagnosis not present

## 2020-01-24 DIAGNOSIS — E1165 Type 2 diabetes mellitus with hyperglycemia: Secondary | ICD-10-CM | POA: Diagnosis not present

## 2020-03-07 DIAGNOSIS — E1165 Type 2 diabetes mellitus with hyperglycemia: Secondary | ICD-10-CM | POA: Diagnosis not present

## 2020-03-07 DIAGNOSIS — K219 Gastro-esophageal reflux disease without esophagitis: Secondary | ICD-10-CM | POA: Diagnosis not present

## 2020-03-07 DIAGNOSIS — I10 Essential (primary) hypertension: Secondary | ICD-10-CM | POA: Diagnosis not present

## 2020-03-07 DIAGNOSIS — F411 Generalized anxiety disorder: Secondary | ICD-10-CM | POA: Diagnosis not present

## 2020-03-07 DIAGNOSIS — G250 Essential tremor: Secondary | ICD-10-CM | POA: Diagnosis not present

## 2020-03-07 DIAGNOSIS — E782 Mixed hyperlipidemia: Secondary | ICD-10-CM | POA: Diagnosis not present

## 2020-03-21 DIAGNOSIS — Z1211 Encounter for screening for malignant neoplasm of colon: Secondary | ICD-10-CM | POA: Diagnosis not present

## 2020-04-13 ENCOUNTER — Ambulatory Visit: Payer: Medicare Other | Admitting: Neurology

## 2020-04-18 DIAGNOSIS — K219 Gastro-esophageal reflux disease without esophagitis: Secondary | ICD-10-CM | POA: Diagnosis not present

## 2020-04-18 DIAGNOSIS — F411 Generalized anxiety disorder: Secondary | ICD-10-CM | POA: Diagnosis not present

## 2020-04-18 DIAGNOSIS — E1165 Type 2 diabetes mellitus with hyperglycemia: Secondary | ICD-10-CM | POA: Diagnosis not present

## 2020-04-18 DIAGNOSIS — E782 Mixed hyperlipidemia: Secondary | ICD-10-CM | POA: Diagnosis not present

## 2020-04-18 DIAGNOSIS — I10 Essential (primary) hypertension: Secondary | ICD-10-CM | POA: Diagnosis not present

## 2020-04-18 DIAGNOSIS — G250 Essential tremor: Secondary | ICD-10-CM | POA: Diagnosis not present

## 2020-05-17 DIAGNOSIS — G250 Essential tremor: Secondary | ICD-10-CM | POA: Diagnosis not present

## 2020-05-17 DIAGNOSIS — E1165 Type 2 diabetes mellitus with hyperglycemia: Secondary | ICD-10-CM | POA: Diagnosis not present

## 2020-05-17 DIAGNOSIS — F411 Generalized anxiety disorder: Secondary | ICD-10-CM | POA: Diagnosis not present

## 2020-05-17 DIAGNOSIS — I10 Essential (primary) hypertension: Secondary | ICD-10-CM | POA: Diagnosis not present

## 2020-05-17 DIAGNOSIS — E782 Mixed hyperlipidemia: Secondary | ICD-10-CM | POA: Diagnosis not present

## 2020-05-17 DIAGNOSIS — K219 Gastro-esophageal reflux disease without esophagitis: Secondary | ICD-10-CM | POA: Diagnosis not present

## 2020-06-19 DIAGNOSIS — K219 Gastro-esophageal reflux disease without esophagitis: Secondary | ICD-10-CM | POA: Diagnosis not present

## 2020-06-19 DIAGNOSIS — G250 Essential tremor: Secondary | ICD-10-CM | POA: Diagnosis not present

## 2020-06-19 DIAGNOSIS — F411 Generalized anxiety disorder: Secondary | ICD-10-CM | POA: Diagnosis not present

## 2020-06-19 DIAGNOSIS — I10 Essential (primary) hypertension: Secondary | ICD-10-CM | POA: Diagnosis not present

## 2020-06-19 DIAGNOSIS — E782 Mixed hyperlipidemia: Secondary | ICD-10-CM | POA: Diagnosis not present

## 2020-06-19 DIAGNOSIS — E1165 Type 2 diabetes mellitus with hyperglycemia: Secondary | ICD-10-CM | POA: Diagnosis not present

## 2020-07-06 DIAGNOSIS — E1165 Type 2 diabetes mellitus with hyperglycemia: Secondary | ICD-10-CM | POA: Diagnosis not present

## 2020-07-06 DIAGNOSIS — Z809 Family history of malignant neoplasm, unspecified: Secondary | ICD-10-CM | POA: Diagnosis not present

## 2020-07-06 DIAGNOSIS — K810 Acute cholecystitis: Secondary | ICD-10-CM | POA: Diagnosis not present

## 2020-07-06 DIAGNOSIS — E6609 Other obesity due to excess calories: Secondary | ICD-10-CM | POA: Diagnosis not present

## 2020-07-06 DIAGNOSIS — R251 Tremor, unspecified: Secondary | ICD-10-CM | POA: Diagnosis not present

## 2020-07-06 DIAGNOSIS — Z9181 History of falling: Secondary | ICD-10-CM | POA: Diagnosis not present

## 2020-07-06 DIAGNOSIS — G250 Essential tremor: Secondary | ICD-10-CM | POA: Diagnosis not present

## 2020-07-06 DIAGNOSIS — I10 Essential (primary) hypertension: Secondary | ICD-10-CM | POA: Diagnosis not present

## 2020-07-06 DIAGNOSIS — R0600 Dyspnea, unspecified: Secondary | ICD-10-CM | POA: Diagnosis not present

## 2020-07-06 DIAGNOSIS — Z6832 Body mass index (BMI) 32.0-32.9, adult: Secondary | ICD-10-CM | POA: Diagnosis not present

## 2020-07-06 DIAGNOSIS — Z6830 Body mass index (BMI) 30.0-30.9, adult: Secondary | ICD-10-CM | POA: Diagnosis not present

## 2020-07-06 DIAGNOSIS — E782 Mixed hyperlipidemia: Secondary | ICD-10-CM | POA: Diagnosis not present

## 2020-07-11 DIAGNOSIS — Z23 Encounter for immunization: Secondary | ICD-10-CM | POA: Diagnosis not present

## 2020-07-11 DIAGNOSIS — E6609 Other obesity due to excess calories: Secondary | ICD-10-CM | POA: Diagnosis not present

## 2020-07-11 DIAGNOSIS — E782 Mixed hyperlipidemia: Secondary | ICD-10-CM | POA: Diagnosis not present

## 2020-07-11 DIAGNOSIS — K219 Gastro-esophageal reflux disease without esophagitis: Secondary | ICD-10-CM | POA: Diagnosis not present

## 2020-07-11 DIAGNOSIS — I10 Essential (primary) hypertension: Secondary | ICD-10-CM | POA: Diagnosis not present

## 2020-07-11 DIAGNOSIS — Z6833 Body mass index (BMI) 33.0-33.9, adult: Secondary | ICD-10-CM | POA: Diagnosis not present

## 2020-07-11 DIAGNOSIS — R945 Abnormal results of liver function studies: Secondary | ICD-10-CM | POA: Diagnosis not present

## 2020-07-11 DIAGNOSIS — F411 Generalized anxiety disorder: Secondary | ICD-10-CM | POA: Diagnosis not present

## 2020-07-11 DIAGNOSIS — R251 Tremor, unspecified: Secondary | ICD-10-CM | POA: Diagnosis not present

## 2020-07-11 DIAGNOSIS — E1165 Type 2 diabetes mellitus with hyperglycemia: Secondary | ICD-10-CM | POA: Diagnosis not present

## 2020-07-12 ENCOUNTER — Other Ambulatory Visit (HOSPITAL_COMMUNITY): Payer: Self-pay | Admitting: Internal Medicine

## 2020-07-12 DIAGNOSIS — R945 Abnormal results of liver function studies: Secondary | ICD-10-CM

## 2020-07-24 DIAGNOSIS — I10 Essential (primary) hypertension: Secondary | ICD-10-CM | POA: Diagnosis not present

## 2020-07-24 DIAGNOSIS — K219 Gastro-esophageal reflux disease without esophagitis: Secondary | ICD-10-CM | POA: Diagnosis not present

## 2020-07-24 DIAGNOSIS — F411 Generalized anxiety disorder: Secondary | ICD-10-CM | POA: Diagnosis not present

## 2020-07-24 DIAGNOSIS — G250 Essential tremor: Secondary | ICD-10-CM | POA: Diagnosis not present

## 2020-07-24 DIAGNOSIS — E782 Mixed hyperlipidemia: Secondary | ICD-10-CM | POA: Diagnosis not present

## 2020-07-24 DIAGNOSIS — E1165 Type 2 diabetes mellitus with hyperglycemia: Secondary | ICD-10-CM | POA: Diagnosis not present

## 2020-08-08 ENCOUNTER — Other Ambulatory Visit: Payer: Self-pay

## 2020-08-08 ENCOUNTER — Ambulatory Visit (HOSPITAL_COMMUNITY)
Admission: RE | Admit: 2020-08-08 | Discharge: 2020-08-08 | Disposition: A | Discharge status: Home / Self Care | Payer: Medicare Other | Source: Ambulatory Visit | Attending: Internal Medicine | Admitting: Internal Medicine

## 2020-08-08 DIAGNOSIS — R945 Abnormal results of liver function studies: Secondary | ICD-10-CM | POA: Diagnosis not present

## 2020-08-16 ENCOUNTER — Encounter: Payer: Self-pay | Admitting: Internal Medicine

## 2020-09-01 DIAGNOSIS — E782 Mixed hyperlipidemia: Secondary | ICD-10-CM | POA: Diagnosis not present

## 2020-09-01 DIAGNOSIS — K219 Gastro-esophageal reflux disease without esophagitis: Secondary | ICD-10-CM | POA: Diagnosis not present

## 2020-09-01 DIAGNOSIS — G250 Essential tremor: Secondary | ICD-10-CM | POA: Diagnosis not present

## 2020-09-01 DIAGNOSIS — I10 Essential (primary) hypertension: Secondary | ICD-10-CM | POA: Diagnosis not present

## 2020-09-01 DIAGNOSIS — F411 Generalized anxiety disorder: Secondary | ICD-10-CM | POA: Diagnosis not present

## 2020-09-01 DIAGNOSIS — E1165 Type 2 diabetes mellitus with hyperglycemia: Secondary | ICD-10-CM | POA: Diagnosis not present

## 2020-09-27 ENCOUNTER — Ambulatory Visit (INDEPENDENT_AMBULATORY_CARE_PROVIDER_SITE_OTHER): Payer: Medicare Other | Admitting: Internal Medicine

## 2020-09-27 ENCOUNTER — Other Ambulatory Visit: Payer: Self-pay

## 2020-09-27 ENCOUNTER — Encounter: Payer: Self-pay | Admitting: Internal Medicine

## 2020-09-27 VITALS — BP 159/89 | HR 82 | Temp 97.0°F | Ht 72.0 in | Wt 232.8 lb

## 2020-09-27 DIAGNOSIS — R945 Abnormal results of liver function studies: Secondary | ICD-10-CM | POA: Diagnosis not present

## 2020-09-27 DIAGNOSIS — R932 Abnormal findings on diagnostic imaging of liver and biliary tract: Secondary | ICD-10-CM | POA: Diagnosis not present

## 2020-09-27 DIAGNOSIS — R7989 Other specified abnormal findings of blood chemistry: Secondary | ICD-10-CM

## 2020-09-27 DIAGNOSIS — K219 Gastro-esophageal reflux disease without esophagitis: Secondary | ICD-10-CM | POA: Diagnosis not present

## 2020-09-27 DIAGNOSIS — K759 Inflammatory liver disease, unspecified: Secondary | ICD-10-CM | POA: Diagnosis not present

## 2020-09-27 NOTE — Patient Instructions (Signed)
I will check blood work today to further evaluate your abnormal liver function test.  I will also order right upper quadrant ultrasound with elastography to further evaluate chronic liver disease.  Follow-up with me in 4 to 6 weeks or sooner if needed.  At [facility] we value your feedback. You may receive a survey about your visit today. Please share your experience as we strive to create trusting relationships with our patients to provide genuine, compassionate, quality care.  We appreciate your understanding and patience as we review any laboratory studies, imaging, and other diagnostic tests that are ordered as we care for you. Our office policy is 5 business days for review of these results, and any emergent or urgent results are addressed in a timely manner for your best interest. If you do not hear from our office in 1 week, please contact us.   We also encourage the use of MyChart, which contains your medical information for your review as well. If you are not enrolled in this feature, an access code is on this after visit summary for your convenience. Thank you for allowing [place] to be involved in your care.  It was great to see you today!  I hope you have a great rest of your winter and thank you for your service.   Elon Alas. Abbey Chatters, D.O. Gastroenterology and Hepatology [facility]

## 2020-09-28 ENCOUNTER — Other Ambulatory Visit: Payer: Self-pay | Admitting: *Deleted

## 2020-09-28 ENCOUNTER — Other Ambulatory Visit: Payer: Self-pay

## 2020-09-28 ENCOUNTER — Telehealth: Payer: Self-pay | Admitting: *Deleted

## 2020-09-28 DIAGNOSIS — K759 Inflammatory liver disease, unspecified: Secondary | ICD-10-CM | POA: Diagnosis not present

## 2020-09-28 DIAGNOSIS — R945 Abnormal results of liver function studies: Secondary | ICD-10-CM | POA: Diagnosis not present

## 2020-09-28 DIAGNOSIS — R932 Abnormal findings on diagnostic imaging of liver and biliary tract: Secondary | ICD-10-CM | POA: Diagnosis not present

## 2020-09-28 NOTE — Telephone Encounter (Signed)
Called patient and I left a details VM regarding [place] appt for 2/2 at 8:30am, arrival 8:15am, npo midnight.  Also left CS # to call if they need to reschedule.

## 2020-09-28 NOTE — Progress Notes (Signed)
Primary Care Physician:  Celene Squibb, MD Primary Gastroenterologist:  Dr. Abbey Chatters  Chief Complaint  Patient presents with  . Cirrhosis    HPI:   Douglas Mason is a 70 y.o. male who presents by referral from Dr. [name] for evaluation.  During recent routine evaluation, patient had blood work performed which showed AST and ALT greater than 100.  He subsequently underwent right upper quadrant ultrasound which mentioned a coarse hepatic echotexture with mildly nodular contour raising the possibility of early/mid cirrhosis.  Platelet count normal.  Patient denies any personal or family history of chronic liver disease.  No alcohol use since his 87s.  No intravenous drug use.  No new herbal supplements.  No new medications.  Primary care physician did decrease his atorvastatin by half once abnormal LFTs were found.  No abdominal pain.  No jaundice.  Does have chronic GERD which is well controlled on pantoprazole 40 mg daily.  No dysphagia odynophagia.  No unintentional weight loss.    Past Medical History:  Diagnosis Date  . Bronchitis   . Diabetes mellitus without complication ([place])   . GERD (gastroesophageal reflux disease)   . High cholesterol   . Hypertension   . Pleurisy   . Pneumonia   . Sleep apnea    nurse in pre-op observed pt sats drop to 80-82% while sleeping on RA,wake up would go to 87%    Past Surgical History:  Procedure Laterality Date  . CHOLECYSTECTOMY N/A 01/13/2019   Procedure: LAPAROSCOPIC CHOLECYSTECTOMY;  Surgeon: Virl Cagey, MD;  Location: [facility];  Service: General;  Laterality: N/A;    Current Outpatient Medications  Medication Sig Dispense Refill  . albuterol (PROVENTIL HFA;VENTOLIN HFA) 108 (90 BASE) MCG/ACT inhaler Inhale 2 puffs into the lungs every 4 (four) hours as needed for wheezing or shortness of breath. 1 Inhaler 1  . albuterol (PROVENTIL) (2.5 MG/3ML) 0.083% nebulizer solution Take 3 mLs (2.5 mg total) by nebulization every 6 (six) hours  as needed for wheezing or shortness of breath. 75 mL 12  . ALPRAZolam (XANAX) 0.5 MG tablet Take 1 tablet by mouth at bedtime as needed. anxiety    . aspirin EC 81 MG tablet Take 81 mg by mouth daily.    Marland Kitchen atorvastatin (LIPITOR) 40 MG tablet Take 40 mg by mouth daily.    Marland Kitchen glipiZIDE (GLUCOTROL XL) 10 MG 24 hr tablet Take 1 tablet by mouth 2 (two) times a day.    . losartan (COZAAR) 50 MG tablet Take 50 mg by mouth daily.    . metFORMIN (GLUCOPHAGE-XR) 500 MG 24 hr tablet Take 2 tablets (1,000 mg total) by mouth 2 (two) times a day.    . Multiple Vitamins-Minerals (MULTIVITAMINS THER. W/MINERALS) TABS tablet Take 1 tablet by mouth daily.    . pantoprazole (PROTONIX) 40 MG tablet Take 1 tablet (40 mg total) by mouth 2 (two) times daily before a meal. (Patient taking differently: Take 40 mg by mouth daily.) 60 tablet 0  . propranolol (INDERAL) 60 MG tablet Take 1 tablet by mouth daily.    Marland Kitchen BYSTOLIC 5 MG tablet TAKE (1) TABLET BY MOUTH ONCE DAILY. (Patient not taking: Reported on 09/27/2020) 30 tablet 2  . chlorpheniramine (CHLORPHEN) 4 MG tablet Take 1 tablet (4 mg total) by mouth at bedtime as needed for allergies. (Patient not taking: Reported on 09/27/2020)  0  . cyclobenzaprine (FLEXERIL) 5 MG tablet Take 1 tablet by mouth 3 (three) times daily as needed. (Patient  not taking: Reported on 09/27/2020)    . docusate sodium (COLACE) 100 MG capsule Take 1 capsule (100 mg total) by mouth 2 (two) times daily. (Patient not taking: Reported on 09/27/2020) 60 capsule 0  . famotidine (PEPCID) 20 MG tablet One at bedtime (Patient not taking: Reported on 09/27/2020) 30 tablet 2  . ondansetron (ZOFRAN) 4 MG tablet Take 1 tablet (4 mg total) by mouth every 6 (six) hours as needed for nausea. (Patient not taking: Reported on 09/27/2020) 20 tablet 0  . oxyCODONE (OXY IR/ROXICODONE) 5 MG immediate release tablet Take 1 tablet (5 mg total) by mouth every 4 (four) hours as needed for severe pain or breakthrough pain.  (Patient not taking: Reported on 09/27/2020) 20 tablet 0  . pravastatin (PRAVACHOL) 40 MG tablet Take 1 tablet by mouth daily. (Patient not taking: Reported on 09/27/2020)     No current facility-administered medications for this visit.    Allergies as of 09/27/2020  . (No Known Allergies)    Family History  Problem Relation Age of Onset  . Asthma Mother   . Leukemia Mother   . Rheum arthritis Mother   . Rheum arthritis Maternal Grandmother     Social History   Socioeconomic History  . Marital status: Married    Spouse name: Not on file  . Number of children: Not on file  . Years of education: Not on file  . Highest education level: Not on file  Occupational History    Employer: [employer]    Comment: [employer]  Tobacco Use  . Smoking status: Former Smoker    Packs/day: 2.00    Years: 30.00    Pack years: 60.00    Types: Cigarettes    Quit date: 09/02/1976    Years since quitting: 44.1  . Smokeless tobacco: Former Systems developer    Types: [place] date: 09/03/2003  Vaping Use  . Vaping Use: Never used  Substance and Sexual Activity  . Alcohol use: No  . Drug use: No  . Sexual activity: Yes  Other Topics Concern  . Not on file  Social History Narrative  . Not on file   Social Determinants of Health   Financial Resource Strain: Not on file  Food Insecurity: Not on file  Transportation Needs: Not on file  Physical Activity: Not on file  Stress: Not on file  Social Connections: Not on file  Intimate Partner Violence: Not on file    Subjective: Review of Systems  Constitutional: Negative for chills and fever.  HENT: Negative for congestion and hearing loss.   Eyes: Negative for blurred vision and double vision.  Respiratory: Negative for cough and shortness of breath.   Cardiovascular: Negative for chest pain and palpitations.  Gastrointestinal: Negative for abdominal pain, blood in stool, constipation, diarrhea, heartburn, melena and vomiting.  Genitourinary:  Negative for dysuria and urgency.  Musculoskeletal: Negative for joint pain and myalgias.  Skin: Negative for itching and rash.  Neurological: Negative for dizziness and headaches.  Psychiatric/Behavioral: Negative for depression. The patient is not nervous/anxious.        Objective: BP (!) 159/89   Pulse 82   Temp (!) 97 F (36.1 C)   Ht 6' (1.829 m)   Wt 232 lb 12.8 oz (105.6 kg)   BMI 31.57 kg/m  Physical Exam Constitutional:      Appearance: Normal appearance.  HENT:     Head: Normocephalic and atraumatic.  Eyes:     Extraocular Movements: Extraocular movements  intact.     Conjunctiva/sclera: Conjunctivae normal.  Cardiovascular:     Rate and Rhythm: Normal rate and regular rhythm.  Pulmonary:     Effort: Pulmonary effort is normal.     Breath sounds: Normal breath sounds.  Abdominal:     General: Bowel sounds are normal.     Palpations: Abdomen is soft.  Musculoskeletal:        General: Normal range of motion.     Cervical back: Normal range of motion and neck supple.  Skin:    General: Skin is warm.  Neurological:     General: No focal deficit present.     Mental Status: He is alert and oriented to person, place, and time.  Psychiatric:        Mood and Affect: Mood normal.        Behavior: Behavior normal.      Assessment: *Abnormal LFTs *Nodular liver-questionable chronic liver disease *GERD-well-controlled on pantoprazole  Plan: Discussed chronic liver disease in depth with patient today.  His ultrasound mentioned a nodular hepatic contour bringing up the possibility of early/mild cirrhosis.  His platelet count is normal as well as INR.  We will further evaluate with ultrasound and elastography.  He may need liver biopsy in the future though we will start with noninvasive testing first.  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  Atorvastatin could also be playing a role though it would be more unusual for  patient to just now develop elevated aminotransferases after being on the medication for years. I will perform full serological work-up today in clinic to look for other etiologies.  We will also recheck patient's LFTs today as well.  Patient's GERD appears to be well controlled on pantoprazole.  We will continue.  Patient to follow-up with me in 2 to 3 months or sooner if needed.  09/28/2020 4:47 PM   Disclaimer: This note was dictated with voice recognition software. Similar sounding words can inadvertently be transcribed and may not be corrected upon review.

## 2020-09-30 DIAGNOSIS — F411 Generalized anxiety disorder: Secondary | ICD-10-CM | POA: Diagnosis not present

## 2020-09-30 DIAGNOSIS — K219 Gastro-esophageal reflux disease without esophagitis: Secondary | ICD-10-CM | POA: Diagnosis not present

## 2020-09-30 DIAGNOSIS — G250 Essential tremor: Secondary | ICD-10-CM | POA: Diagnosis not present

## 2020-09-30 DIAGNOSIS — I10 Essential (primary) hypertension: Secondary | ICD-10-CM | POA: Diagnosis not present

## 2020-09-30 DIAGNOSIS — E1165 Type 2 diabetes mellitus with hyperglycemia: Secondary | ICD-10-CM | POA: Diagnosis not present

## 2020-09-30 DIAGNOSIS — E782 Mixed hyperlipidemia: Secondary | ICD-10-CM | POA: Diagnosis not present

## 2020-10-04 ENCOUNTER — Ambulatory Visit (HOSPITAL_COMMUNITY)
Admission: RE | Admit: 2020-10-04 | Discharge: 2020-10-04 | Disposition: A | Discharge status: Home / Self Care | Payer: Medicare Other | Source: Ambulatory Visit | Attending: Internal Medicine | Admitting: Internal Medicine

## 2020-10-04 ENCOUNTER — Other Ambulatory Visit: Payer: Self-pay

## 2020-10-04 DIAGNOSIS — R7989 Other specified abnormal findings of blood chemistry: Secondary | ICD-10-CM | POA: Diagnosis not present

## 2020-10-04 DIAGNOSIS — R945 Abnormal results of liver function studies: Secondary | ICD-10-CM | POA: Insufficient documentation

## 2020-10-04 DIAGNOSIS — R932 Abnormal findings on diagnostic imaging of liver and biliary tract: Secondary | ICD-10-CM | POA: Diagnosis not present

## 2020-10-04 LAB — COMPREHENSIVE METABOLIC PANEL
AG Ratio: 1 (calc) (ref 1.0–2.5)
ALT: 208 U/L — ABNORMAL HIGH (ref 9–46)
AST: 125 U/L — ABNORMAL HIGH (ref 10–35)
Albumin: 4 g/dL (ref 3.6–5.1)
Alkaline phosphatase (APISO): 68 U/L (ref 35–144)
BUN: 17 mg/dL (ref 7–25)
CO2: 29 mmol/L (ref 20–32)
Calcium: 9.5 mg/dL (ref 8.6–10.3)
Chloride: 99 mmol/L (ref 98–110)
Creat: 0.83 mg/dL (ref 0.70–1.18)
Globulin: 4 g/dL (calc) — ABNORMAL HIGH (ref 1.9–3.7)
Glucose, Bld: 250 mg/dL — ABNORMAL HIGH (ref 65–99)
Potassium: 4.5 mmol/L (ref 3.5–5.3)
Sodium: 137 mmol/L (ref 135–146)
Total Bilirubin: 1.4 mg/dL — ABNORMAL HIGH (ref 0.2–1.2)
Total Protein: 8 g/dL (ref 6.1–8.1)

## 2020-10-04 LAB — FERRITIN: Ferritin: 353 ng/mL (ref 24–380)

## 2020-10-04 LAB — PROTIME-INR
INR: 1
Prothrombin Time: 10.6 s (ref 9.0–11.5)

## 2020-10-04 LAB — HEPATITIS B SURFACE ANTIBODY,QUALITATIVE: Hep B S Ab: NONREACTIVE

## 2020-10-04 LAB — HEPATITIS A ANTIBODY, TOTAL: Hepatitis A AB,Total: NONREACTIVE

## 2020-10-04 LAB — HEPATITIS B SURFACE ANTIGEN: Hepatitis B Surface Ag: NONREACTIVE

## 2020-10-04 LAB — ALPHA-1-ANTITRYPSIN: A-1 Antitrypsin, Ser: 188 mg/dL (ref 83–199)

## 2020-10-04 LAB — ANTI-SMOOTH MUSCLE ANTIBODY, IGG: Actin (Smooth Muscle) Antibody (IGG): 68 U — ABNORMAL HIGH (ref <20)

## 2020-10-04 LAB — HEPATITIS C ANTIBODY
Hepatitis C Ab: NONREACTIVE
SIGNAL TO CUT-OFF: 0.05 (ref <1.00)

## 2020-10-04 LAB — MITOCHONDRIAL ANTIBODIES: Mitochondrial M2 Ab, IgG: <=20 U

## 2020-10-04 LAB — HEPATITIS B CORE ANTIBODY, TOTAL: Hep B Core Total Ab: NONREACTIVE

## 2020-10-11 ENCOUNTER — Other Ambulatory Visit: Payer: Self-pay | Admitting: Radiology

## 2020-10-11 ENCOUNTER — Other Ambulatory Visit: Payer: Self-pay | Admitting: Student

## 2020-10-11 NOTE — H&P (Signed)
Chief Complaint: Elevated LFTs  Referring Physician(s): Hurshel Keys  Supervising Physician: Corrie Mckusick  Patient Status: [facility] - Out-pt  History of Present Illness: Douglas Mason is a 70 y.o. male who had routine labs drawn by his PCP.  His LFTs showed AST and ALT greater than 100.    He subsequently underwent right upper quadrant ultrasound which mentioned a coarse hepatic echotexture with mildly nodular contour raising the possibility of early/mid cirrhosis.  He is here today for random liver biopsy.  He is NPO. No nausea/vomiting. No Fever/chills. ROS negative.   Past Medical History:  Diagnosis Date   Bronchitis    Diabetes mellitus without complication (HCC)    GERD (gastroesophageal reflux disease)    High cholesterol    Hypertension    Pleurisy    Pneumonia    Sleep apnea    nurse in pre-op observed pt sats drop to 80-82% while sleeping on RA,wake up would go to 87%    Past Surgical History:  Procedure Laterality Date   CHOLECYSTECTOMY N/A 01/13/2019   Procedure: LAPAROSCOPIC CHOLECYSTECTOMY;  Surgeon: Virl Cagey, MD;  Location: [facility];  Service: General;  Laterality: N/A;    Allergies: Lisinopril  Medications: Prior to Admission medications   Medication Sig Start Date End Date Taking? Authorizing Provider  albuterol (PROVENTIL HFA;VENTOLIN HFA) 108 (90 BASE) MCG/ACT inhaler Inhale 2 puffs into the lungs every 4 (four) hours as needed for wheezing or shortness of breath. 06/28/14  Yes Virgel Manifold, MD  albuterol (PROVENTIL) (2.5 MG/3ML) 0.083% nebulizer solution Take 3 mLs (2.5 mg total) by nebulization every 6 (six) hours as needed for wheezing or shortness of breath. 08/18/14  Yes Parrett, Fonnie Mu, NP  ALPRAZolam (XANAX) 0.5 MG tablet Take 0.5 mg by mouth at bedtime as needed for anxiety. 01/12/14  Yes [provider]  aspirin EC 81 MG tablet Take 81 mg by mouth daily.   Yes [provider]  atorvastatin  (LIPITOR) 40 MG tablet Take 20 mg by mouth daily.   Yes [provider]  glipiZIDE (GLUCOTROL XL) 10 MG 24 hr tablet Take 1 tablet by mouth 2 (two) times a day. 12/04/18  Yes [provider]  losartan (COZAAR) 50 MG tablet Take 50 mg by mouth daily.   Yes [provider]  metFORMIN (GLUCOPHAGE-XR) 500 MG 24 hr tablet Take 2 tablets (1,000 mg total) by mouth 2 (two) times a day. 01/16/19  Yes Virl Cagey, MD  Multiple Vitamins-Minerals (MULTIVITAMINS THER. W/MINERALS) TABS tablet Take 1 tablet by mouth daily.   Yes [provider]  pantoprazole (PROTONIX) 40 MG tablet Take 1 tablet (40 mg total) by mouth 2 (two) times daily before a meal. Patient taking differently: Take 40 mg by mouth at bedtime. 03/17/14  Yes Kelby Aline, MD  propranolol ER (INDERAL LA) 60 MG 24 hr capsule Take 60 mg by mouth daily. 09/20/20  Yes [provider]     Family History  Problem Relation Age of Onset   Asthma Mother    Leukemia Mother    Rheum arthritis Mother    Rheum arthritis Maternal Grandmother     Social History   Socioeconomic History   Marital status: Married    Spouse name: Not on file   Number of children: Not on file   Years of education: Not on file   Highest education level: Not on file  Occupational History    Employer: [employer]    Comment: [employer]  Tobacco Use   Smoking status: Former Smoker    Packs/day: 2.00    Years: 30.00    Pack years: 60.00    Types: Cigarettes    Quit date: 09/02/1976    Years since quitting: 44.1   Smokeless tobacco: Former Systems developer    Types: Chew    Quit date: 09/03/2003  Vaping Use   Vaping Use: Never used  Substance and Sexual Activity   Alcohol use: No   Drug use: No   Sexual activity: Yes  Other Topics Concern   Not on file  Social History Narrative   Not on file   Social Determinants of Health   Financial Resource Strain: Not on file  Food Insecurity: Not on file  Transportation  Needs: Not on file  Physical Activity: Not on file  Stress: Not on file  Social Connections: Not on file     Review of Systems: A 12 point ROS discussed and pertinent positives are indicated in the HPI above.  All other systems are negative.  Review of Systems  Vital Signs: BP (!) 163/82    Pulse 76    Temp 97.9 F (36.6 C) (Oral)    Resp 16    Ht 6' (1.829 m)    Wt 103.4 kg    SpO2 95%    BMI 30.92 kg/m   Physical Exam Vitals reviewed.  Constitutional:      Appearance: Normal appearance.  HENT:     Head: Normocephalic and atraumatic.  Eyes:     Extraocular Movements: Extraocular movements intact.  Cardiovascular:     Rate and Rhythm: Normal rate and regular rhythm.  Pulmonary:     Effort: Pulmonary effort is normal. No respiratory distress.     Breath sounds: Normal breath sounds.  Abdominal:     General: There is no distension.     Palpations: Abdomen is soft.     Tenderness: There is no abdominal tenderness.  Musculoskeletal:        General: Normal range of motion.     Cervical back: Normal range of motion.  Skin:    General: Skin is warm and dry.  Neurological:     General: No focal deficit present.     Mental Status: He is alert and oriented to person, place, and time.  Psychiatric:        Mood and Affect: Mood normal.        Behavior: Behavior normal.        Thought Content: Thought content normal.        Judgment: Judgment normal.     Imaging: US ABDOMEN RUQ W/ELASTOGRAPHY  Result Date: 10/04/2020 CLINICAL DATA:  Abnormal LFTs, echogenic liver on prior ultrasound. EXAM: US ABDOMEN LIMITED - RIGHT UPPER QUADRANT ULTRASOUND HEPATIC ELASTOGRAPHY TECHNIQUE: Sonography of the right upper quadrant was performed. In addition, ultrasound elastography evaluation of the liver was performed. A region of interest was placed within the right lobe of the liver. Following application of a compressive sonographic pulse, tissue compressibility was assessed. Multiple  assessments were performed at the selected site. Median tissue compressibility was determined. Previously, hepatic stiffness was assessed by shear wave velocity. Based on recently published Society of Radiologists in Ultrasound consensus article, reporting is now recommended to be performed in the SI units of pressure (kiloPascals) representing hepatic stiffness/elasticity. The obtained result is compared to the published reference standards. (cACLD = compensated Advanced Chronic Liver Disease) COMPARISON:  Abdominal ultrasound August 08, 2020. FINDINGS: ULTRASOUND ABDOMEN LIMITED RIGHT  UPPER QUADRANT Gallbladder: Surgically absent Common bile duct: Diameter: 3 mm Liver: No focal lesion identified. Heterogeneously coarsened hepatic echotexture with diffusely increased parenchymal echogenicity and subtle contour nodularity. Portal vein is patent on color Doppler imaging with normal direction of blood flow towards the liver. ULTRASOUND HEPATIC ELASTOGRAPHY Device: Siemens Helix VTQ Patient position: Oblique Transducer 5C1 Number of measurements: 14 Hepatic segment:  8 Median kPa: 8.1 IQR: 0.5 IQR/Median kPa ratio: 0.1 Data quality:  Good Diagnostic category: < or = 9 kPa: in the absence of other known clinical signs, rules out cACLD The use of hepatic elastography is applicable to patients with viral hepatitis and non-alcoholic fatty liver disease. At this time, there is insufficient data for the referenced cut-off values and use in other causes of liver disease, including alcoholic liver disease. Patients, however, may be assessed by elastography and serve as their own reference standard/baseline. In patients with non-alcoholic liver disease, the values suggesting compensated advanced chronic liver disease (cACLD) may be lower, and patients may need additional testing with elasticity results of 7-9 kPa. Please note that abnormal hepatic elasticity and shear wave velocities may also be identified in clinical settings  other than with hepatic fibrosis, such as: acute hepatitis, elevated right heart and central venous pressures including use of beta blockers, veno-occlusive disease (Budd-Chiari), infiltrative processes such as mastocytosis/amyloidosis/infiltrative tumor/lymphoma, extrahepatic cholestasis, with hyperemia in the post-prandial state, and with liver transplantation. Correlation with patient history, laboratory data, and clinical condition recommended. Diagnostic Categories: < or =5 kPa: high probability of being normal < or =9 kPa: in the absence of other known clinical signs, rules out cACLD >9 kPa and ?13 kPa: suggestive of cACLD, but needs further testing >13 kPa: highly suggestive of cACLD > or =17 kPa: highly suggestive of cACLD with an increased probability of clinically significant portal hypertension IMPRESSION: ULTRASOUND RUQ: Coarsened hepatic echotexture with subtle contour nodularity, suspicious for early/mild hepatic cirrhosis. No focal hepatic lesion. ULTRASOUND HEPATIC ELASTOGRAPHY: Median kPa: 8.1. Diagnostic category: < or 9 kPa: in the absence of other known clinical signs, including alcoholic liver disease. Patients, however, may be assessed by elastography and serve as their own reference standard/baseline. Electronically Signed   By: Dahlia Bailiff MD   On: 10/04/2020 09:59    Labs:  CBC: Recent Labs    10/12/20 0625  WBC 7.4  HGB 13.5  HCT 39.6  PLT 277    COAGS: Recent Labs    09/28/20 0907  INR 1.0    BMP: Recent Labs    09/28/20 0907  NA 137  K 4.5  CL 99  CO2 29  GLUCOSE 250*  BUN 17  CALCIUM 9.5  CREATININE 0.83    LIVER FUNCTION TESTS: Recent Labs    09/28/20 0907  BILITOT 1.4*  AST 125*  ALT 208*  PROT 8.0    TUMOR MARKERS: No results for input(s): AFPTM, CEA, CA199, CHROMGRNA in the last 8760 hours.  Assessment and Plan:  AST and ALT greater than 100.    Will proceed with image guided random liver biopsy today by Dr. Earleen Newport.  Risks and  benefits of random liver was discussed with the patient and/or patient's family including, but not limited to bleeding, infection, damage to adjacent structures or low yield requiring additional tests.  All of the questions were answered and there is agreement to proceed.  Consent signed and in chart.  Thank you for this interesting consult.  I greatly enjoyed meeting Douglas Mason and look forward to participating in  their care.  A copy of this report was sent to the requesting provider on this date.  Electronically Signed: Murrell Redden, PA-C   10/12/2020, 7:07 AM      I spent a total of  15 Minutes  in face to face in clinical consultation, greater than 50% of which was counseling/coordinating care for random liver biopsy.

## 2020-10-12 ENCOUNTER — Ambulatory Visit (HOSPITAL_COMMUNITY)
Admission: RE | Admit: 2020-10-12 | Discharge: 2020-10-12 | Disposition: A | Discharge status: Home / Self Care | Payer: Medicare Other | Source: Ambulatory Visit | Attending: Internal Medicine | Admitting: Internal Medicine

## 2020-10-12 ENCOUNTER — Other Ambulatory Visit: Payer: Self-pay

## 2020-10-12 ENCOUNTER — Encounter (HOSPITAL_COMMUNITY): Payer: Self-pay

## 2020-10-12 DIAGNOSIS — R7989 Other specified abnormal findings of blood chemistry: Secondary | ICD-10-CM | POA: Diagnosis not present

## 2020-10-12 DIAGNOSIS — Z87891 Personal history of nicotine dependence: Secondary | ICD-10-CM | POA: Diagnosis not present

## 2020-10-12 DIAGNOSIS — E7800 Pure hypercholesterolemia, unspecified: Secondary | ICD-10-CM | POA: Insufficient documentation

## 2020-10-12 DIAGNOSIS — R945 Abnormal results of liver function studies: Secondary | ICD-10-CM | POA: Diagnosis not present

## 2020-10-12 DIAGNOSIS — K7400 Hepatic fibrosis, unspecified: Secondary | ICD-10-CM | POA: Diagnosis not present

## 2020-10-12 DIAGNOSIS — K739 Chronic hepatitis, unspecified: Secondary | ICD-10-CM | POA: Diagnosis not present

## 2020-10-12 DIAGNOSIS — R7401 Elevation of levels of liver transaminase levels: Secondary | ICD-10-CM | POA: Diagnosis not present

## 2020-10-12 LAB — PROTIME-INR
INR: 1.1 (ref 0.8–1.2)
Prothrombin Time: 13.9 seconds (ref 11.4–15.2)

## 2020-10-12 LAB — CBC
HCT: 39.6 % (ref 39.0–52.0)
Hemoglobin: 13.5 g/dL (ref 13.0–17.0)
MCH: 31.8 pg (ref 26.0–34.0)
MCHC: 34.1 g/dL (ref 30.0–36.0)
MCV: 93.4 fL (ref 80.0–100.0)
Platelets: 277 10*3/uL (ref 150–400)
RBC: 4.24 MIL/uL (ref 4.22–5.81)
RDW: 14.3 % (ref 11.5–15.5)
WBC: 7.4 10*3/uL (ref 4.0–10.5)
nRBC: 0 % (ref 0.0–0.2)

## 2020-10-12 LAB — GLUCOSE, CAPILLARY
Glucose-Capillary: 265 mg/dL — ABNORMAL HIGH (ref 70–99)
Glucose-Capillary: 234 mg/dL — ABNORMAL HIGH (ref 70–99)

## 2020-10-12 MED ORDER — SODIUM CHLORIDE 0.9 % IV SOLN
INTRAVENOUS | Status: AC | PRN
  Administered 2020-10-12: 10 mL/h via INTRAVENOUS

## 2020-10-12 MED ORDER — SODIUM CHLORIDE 0.9 % IV SOLN: INTRAVENOUS | Status: DC

## 2020-10-12 MED ORDER — LIDOCAINE HCL (PF) 1 % IJ SOLN
INTRAMUSCULAR | Status: AC
  Filled 2020-10-12: qty 30

## 2020-10-12 MED ORDER — FENTANYL CITRATE (PF) 100 MCG/2ML IJ SOLN
INTRAMUSCULAR | Status: AC
  Filled 2020-10-12: qty 2

## 2020-10-12 MED ORDER — GELATIN ABSORBABLE 12-7 MM EX MISC
CUTANEOUS | Status: AC
  Filled 2020-10-12: qty 1

## 2020-10-12 MED ORDER — FENTANYL CITRATE (PF) 100 MCG/2ML IJ SOLN
INTRAMUSCULAR | Status: AC | PRN
  Administered 2020-10-12 (×4): 25 ug via INTRAVENOUS

## 2020-10-12 MED ORDER — MIDAZOLAM HCL 2 MG/2ML IJ SOLN
INTRAMUSCULAR | Status: AC
  Filled 2020-10-12: qty 2

## 2020-10-12 MED ORDER — MIDAZOLAM HCL 2 MG/2ML IJ SOLN
INTRAMUSCULAR | Status: AC | PRN
  Administered 2020-10-12: 0.5 mg via INTRAVENOUS
  Administered 2020-10-12: 1 mg via INTRAVENOUS
  Administered 2020-10-12: 0.5 mg via INTRAVENOUS

## 2020-10-12 NOTE — Progress Notes (Signed)
Discharge instructions reviewed with patient and family. Verbalized understanding. 

## 2020-10-12 NOTE — Discharge Instructions (Signed)
Liver Biopsy, Care After These instructions give you information on caring for yourself after your procedure. Your doctor may also give you more specific instructions. Call your doctor if you have any problems or questions after your procedure. What can I expect after the procedure? After the procedure, it is common to have:  Pain and soreness where the biopsy was done.  Bruising around the area where the biopsy was done.  Sleepiness and be tired for a few days. Follow these instructions at home: Medicines  Take over-the-counter and prescription medicines only as told by your doctor.  If you were prescribed an antibiotic medicine, take it as told by your doctor. Do not stop taking the antibiotic even if you start to feel better.  Do not take medicines such as aspirin and ibuprofen. These medicines can thin your blood. Do not take these medicines unless your doctor tells you to take them.  If you are taking prescription pain medicine, take actions to prevent or treat constipation. Your doctor may recommend that you: ? Drink enough fluid to keep your pee (urine) clear or pale yellow. ? Take over-the-counter or prescription medicines. ? Eat foods that are high in fiber, such as fresh fruits and vegetables, whole grains, and beans. ? Limit foods that are high in fat and processed sugars, such as fried and sweet foods. Caring for your cut  Follow instructions from your doctor about how to take care of your cuts from surgery (incisions). Make sure you: ? Wash your hands with soap and water before you change your bandage (dressing). If you cannot use soap and water, use hand sanitizer. ? Change your bandage as told by your doctor. ? Leave stitches (sutures), skin glue, or skin tape (adhesive) strips in place. They may need to stay in place for 2 weeks or longer. If tape strips get loose and curl up, you may trim the loose edges. Do not remove tape strips completely unless your doctor says it is  okay.  Check your cuts every day for signs of infection. Check for: ? Redness, swelling, or more pain. ? Fluid or blood. ? Pus or a bad smell. ? Warmth.  Do not take baths, swim, or use a hot tub until your doctor says it is okay to do so. Activity  Rest at home for 1-2 days or as told by your doctor. ? Avoid sitting for a long time without moving. Get up to take short walks every 1-2 hours.  Return to your normal activities as told by your doctor. Ask what activities are safe for you.  Do not do these things in the first 24 hours: ? Drive. ? Use machinery. ? Take a bath or shower.  Do not lift more than 10 pounds (4.5 kg) or play contact sports for the first 2 weeks.   General instructions  Do not drink alcohol in the first week after the procedure.  Have someone stay with you for at least 24 hours after the procedure.  Get your test results. Ask your doctor or the department that is doing the test: ? When will my results be ready? ? How will I get my results? ? What are my treatment options? ? What other tests do I need? ? What are my next steps?  Keep all follow-up visits as told by your doctor. This is important.   Contact a doctor if:  A cut bleeds and leaves more than just a small spot of blood.  A cut is red,   puffs up (swells), or hurts more than before.  Fluid or something else comes from a cut.  A cut smells bad.  You have a fever or chills. Get help right away if:  You have swelling, bloating, or pain in your belly (abdomen).  You get dizzy or faint.  You have a rash.  You feel sick to your stomach (nauseous) or throw up (vomit).  You have trouble breathing, feel short of breath, or feel faint.  Your chest hurts.  You have problems talking or seeing.  You have trouble with your balance or moving your arms or legs. Summary  After the procedure, it is common to have pain, soreness, bruising, and tiredness.  Your doctor will tell you how to  take care of yourself at home. Change your bandage, take your medicines, and limit your activities as told by your doctor.  Call your doctor if you have symptoms of infection. Get help right away if your belly swells, your cut bleeds a lot, or you have trouble talking or breathing. This information is not intended to replace advice given to you by your health care provider. Make sure you discuss any questions you have with your health care provider. Document Revised: 08/28/2017 Document Reviewed: 08/29/2017 Elsevier Patient Education  2021 Elsevier Inc. Moderate Conscious Sedation, Adult Sedation is the use of medicines to promote relaxation and to relieve discomfort and anxiety. Moderate conscious sedation is a type of sedation. Under moderate conscious sedation, you are less alert than normal, but you are still able to respond to instructions, touch, or both. Moderate conscious sedation is used during short medical and dental procedures. It is milder than deep sedation, which is a type of sedation under which you cannot be easily woken up. It is also milder than general anesthesia, which is the use of medicines to make you unconscious. Moderate conscious sedation allows you to return to your regular activities sooner. Tell a health care provider about:  Any allergies you have.  All medicines you are taking, including vitamins, herbs, eye drops, creams, and over-the-counter medicines.  Any use of steroids. This includes steroids taken by mouth or as a cream.  Any problems you or family members have had with sedatives and anesthetic medicines.  Any blood disorders you have.  Any surgeries you have had.  Any medical conditions you have, such as sleep apnea.  Whether you are pregnant or may be pregnant.  Any use of cigarettes, alcohol, marijuana, or drugs. What are the risks? Generally, this is a safe procedure. However, problems may occur, including:  Getting too much medicine  (oversedation).  Nausea.  Allergic reaction to medicines.  Trouble breathing. If this happens, a breathing tube may be used. It will be removed when you are awake and breathing on your own.  Heart trouble.  Lung trouble.  Confusion that gets better with time (emergence delirium). What happens before the procedure? Staying hydrated Follow instructions from your health care provider about hydration, which may include:  Up to 2 hours before the procedure - you may continue to drink clear liquids, such as water, clear fruit juice, black coffee, and plain tea. Eating and drinking restrictions Follow instructions from your health care provider about eating and drinking, which may include:  8 hours before the procedure - stop eating heavy meals or foods, such as meat, fried foods, or fatty foods.  6 hours before the procedure - stop eating light meals or foods, such as toast or cereal.  6 hours   before the procedure - stop drinking milk or drinks that contain milk.  2 hours before the procedure - stop drinking clear liquids. Medicines Ask your health care provider about:  Changing or stopping your regular medicines. This is especially important if you are taking diabetes medicines or blood thinners.  Taking medicines such as aspirin and ibuprofen. These medicines can thin your blood. Do not take these medicines unless your health care provider tells you to take them.  Taking over-the-counter medicines, vitamins, herbs, and supplements. Tests and exams  You will have a physical exam.  You may have blood tests done to show how well: ? Your kidneys and liver work. ? Your blood clots. General instructions  Plan to have a responsible adult take you home from the hospital or clinic.  If you will be going home right after the procedure, plan to have a responsible adult care for you for the time you are told. This is important. What happens during the procedure?  You will be given  the sedative. The sedative may be given: ? As a pill that you will swallow. It can also be inserted into the rectum. ? As a spray through the nose. ? As an injection into the muscle. ? As an injection into the vein through an IV.  You may be given oxygen as needed.  Your breathing, heart rate, and blood pressure will be monitored during the procedure.  The medical or dental procedure will be done. The procedure may vary among health care providers and hospitals.   What happens after the procedure?  Your blood pressure, heart rate, breathing rate, and blood oxygen level will be monitored until you leave the hospital or clinic.  You will get fluids through your IV if needed.  Do not drive or operate machinery until your health care provider says that it is safe. Summary  Sedation is the use of medicines to promote relaxation and to relieve discomfort and anxiety. Moderate conscious sedation is a type of sedation that is used during short medical and dental procedures.  Tell the health care provider about any medical conditions that you have and about all the medicines that you are taking.  You will be given the sedative as a pill, a spray through the nose, an injection into the muscle, or an injection into the vein through an IV. Vital signs are monitored during the sedation.  Moderate conscious sedation allows you to return to your regular activities sooner. This information is not intended to replace advice given to you by your health care provider. Make sure you discuss any questions you have with your health care provider. Document Revised: 12/17/2019 Document Reviewed: 07/15/2019 Elsevier Patient Education  2021 Elsevier Inc.  

## 2020-10-12 NOTE — Procedures (Signed)
Interventional Radiology Procedure Note  Procedure: US guided biopsy of liver for medical liver purpose Complications: None EBL: None Recommendations: - Bedrest 2 hours.   - Routine wound care - Follow up pathology - Advance diet   Signed,  Leveon Pelzer, DO   

## 2020-10-18 LAB — SURGICAL PATHOLOGY

## 2020-10-19 ENCOUNTER — Telehealth: Payer: Self-pay | Admitting: Internal Medicine

## 2020-10-19 NOTE — Telephone Encounter (Signed)
Patient called asking about his liver biopsy results

## 2020-10-19 NOTE — Telephone Encounter (Signed)
FYI: this pt is calling for his Liver Biopsy results. I assured him when you release them to me I will call him.

## 2020-10-20 ENCOUNTER — Telehealth: Payer: Self-pay

## 2020-10-20 ENCOUNTER — Other Ambulatory Visit: Payer: Self-pay

## 2020-10-20 ENCOUNTER — Telehealth: Payer: Self-pay | Admitting: Gastroenterology

## 2020-10-20 DIAGNOSIS — R7989 Other specified abnormal findings of blood chemistry: Secondary | ICD-10-CM

## 2020-10-20 DIAGNOSIS — K759 Inflammatory liver disease, unspecified: Secondary | ICD-10-CM

## 2020-10-20 DIAGNOSIS — R945 Abnormal results of liver function studies: Secondary | ICD-10-CM

## 2020-10-20 MED ORDER — PREDNISONE 10 MG PO TABS: ORAL_TABLET | ORAL | 0 refills | Status: AC

## 2020-10-20 NOTE — Telephone Encounter (Signed)
Routing to [place] as well.

## 2020-10-20 NOTE — Telephone Encounter (Signed)
Pt's wife called regarding her husbands results and I advised I just got results yesterday therefore Dr. Abbey Chatters has to sign off on the report before I can tell her anything. Pt's wife accused [place] of not doing our job, ruining their weekend last week and this week, having a Dr that pt's can't reach, etc. I advised her that Dr. Abbey Chatters was not in office today but I will get another DR/PA to read and release to me so I can report back to her and her husband. That wasn't good enough for her she stated. States she will call her PCP and the office manager there because they referred them here. I advised her that's fine she has every right but [facility] has a policy where results are sent to the pt before the Dr and I just saw them yesterday. She hung up. I called Dr. Abbey Chatters advised of this situation and asked him if I could get another DR/PA to read so I can call the pt back. Dr. Abbey Chatters states he will call Vicente Males and advise her. Will advise Almyra Free of this situation.

## 2020-10-20 NOTE — Telephone Encounter (Signed)
Dena:  Spoke with Dr. Abbey Chatters. We are starting steroid monotherapy for now but likely referring to [facility] in [place] in future for consideration of azathioprine.   FOR NOW to get the ball rolling on inducing remission, he needs to start on a prednisone taper.  1. Take 60 mg of prednisone daily for 1 week, then 40 mg for 1 week, then 30 mg for 1 week, then 20 mg daily.   2. Once he gets to the 20 mg daily dosing, we will decide how long to be on this. I have written the 20 mg dosing for at least 3-4 weeks so he will have plenty of medication and not run out when he is at that dosage.   3. Needs LFTs 4 weeks after starting treatment.  4. He has a history of diabetes. Please have him check his blood sugars at home daily if he has access to this. Please also reach out to PCP and let them know that he is starting prednisone as an FYI as his blood sugars could spike while on this.

## 2020-10-20 NOTE — Telephone Encounter (Signed)
Per Dr. Abbey Chatters , he phoned and discussed with the pt Douglas Mason his results. 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 His plan is to put the pt on a Prednisone Taper (which Roseanne Kaufman will take care of per Dr. Abbey Chatters) we will re-check the pt's LFT's in 3 to 4 weeks. Dr Abbey Chatters also discussed with the pt the side effects of prednisone. (Pt has DM). Will send the pt lab form for the pt to have LFT's checked week of March 14,2022. Will mail out today. Will send this encounter to Windy Canny, Shelly Bombard and Burnadette Peter. Dr. Abbey Chatters stated the pt was fine with things but the wife was in the back ground cursing @ him.

## 2020-10-20 NOTE — Telephone Encounter (Signed)
Attempted to call pt and I was hung up on @ 11:59 am. Will attempt again Monday. Pt already knows about the bloodwork needing to be rechecked.

## 2020-10-23 NOTE — Telephone Encounter (Signed)
Phoned and advised the pt of the plan to get him started with his liver treatment. I read out his Prednisone Taper then when he gets to 20 mg that Dr. Abbey Chatters will decide how much longer the pt will need to take the Prednisone. Advised of labs needing to be rechecked in 4 weeks after starting the treatment. The pt's labs have been mailed out to him with the instructions on when to do it. Was advised to monitor daily his blood sugars. Reached out to the pts PCP to advised them of pt starting prednisone as FYI incase of blood sugars spiking.

## 2020-10-30 DIAGNOSIS — F411 Generalized anxiety disorder: Secondary | ICD-10-CM | POA: Diagnosis not present

## 2020-10-30 DIAGNOSIS — E782 Mixed hyperlipidemia: Secondary | ICD-10-CM | POA: Diagnosis not present

## 2020-10-30 DIAGNOSIS — G250 Essential tremor: Secondary | ICD-10-CM | POA: Diagnosis not present

## 2020-10-30 DIAGNOSIS — I10 Essential (primary) hypertension: Secondary | ICD-10-CM | POA: Diagnosis not present

## 2020-10-30 DIAGNOSIS — E1165 Type 2 diabetes mellitus with hyperglycemia: Secondary | ICD-10-CM | POA: Diagnosis not present

## 2020-10-30 DIAGNOSIS — K219 Gastro-esophageal reflux disease without esophagitis: Secondary | ICD-10-CM | POA: Diagnosis not present

## 2020-11-13 DIAGNOSIS — R945 Abnormal results of liver function studies: Secondary | ICD-10-CM | POA: Diagnosis not present

## 2020-11-13 DIAGNOSIS — K759 Inflammatory liver disease, unspecified: Secondary | ICD-10-CM | POA: Diagnosis not present

## 2020-11-14 LAB — HEPATIC FUNCTION PANEL
AG Ratio: 1.5 (calc) (ref 1.0–2.5)
ALT: 46 U/L (ref 9–46)
AST: 27 U/L (ref 10–35)
Albumin: 4 g/dL (ref 3.6–5.1)
Alkaline phosphatase (APISO): 49 U/L (ref 35–144)
Bilirubin, Direct: 0.2 mg/dL (ref 0.0–0.2)
Globulin: 2.7 g/dL (calc) (ref 1.9–3.7)
Indirect Bilirubin: 0.7 mg/dL (calc) (ref 0.2–1.2)
Total Bilirubin: 0.9 mg/dL (ref 0.2–1.2)
Total Protein: 6.7 g/dL (ref 6.1–8.1)

## 2020-11-16 ENCOUNTER — Telehealth: Payer: Self-pay | Admitting: Internal Medicine

## 2020-11-16 NOTE — Telephone Encounter (Signed)
See result note.  

## 2020-11-16 NOTE — Telephone Encounter (Signed)
Pt was returning a call from yesterday. I told him it looked like Dr Abbey Chatters had tried to call but didn't get an answer and he wasn't here today. I told him also that Dr Abbey Chatters wants him to follow up with [place] in 6-8 weeks. He is aware of OV. Please call patient back at [phone]

## 2020-11-29 DIAGNOSIS — E782 Mixed hyperlipidemia: Secondary | ICD-10-CM | POA: Diagnosis not present

## 2020-11-29 DIAGNOSIS — E1165 Type 2 diabetes mellitus with hyperglycemia: Secondary | ICD-10-CM | POA: Diagnosis not present

## 2020-11-29 DIAGNOSIS — K219 Gastro-esophageal reflux disease without esophagitis: Secondary | ICD-10-CM | POA: Diagnosis not present

## 2020-11-29 DIAGNOSIS — I10 Essential (primary) hypertension: Secondary | ICD-10-CM | POA: Diagnosis not present

## 2020-11-29 DIAGNOSIS — F411 Generalized anxiety disorder: Secondary | ICD-10-CM | POA: Diagnosis not present

## 2020-11-29 DIAGNOSIS — G250 Essential tremor: Secondary | ICD-10-CM | POA: Diagnosis not present

## 2020-12-21 ENCOUNTER — Ambulatory Visit (INDEPENDENT_AMBULATORY_CARE_PROVIDER_SITE_OTHER): Payer: Medicare Other | Admitting: Internal Medicine

## 2020-12-21 ENCOUNTER — Encounter: Payer: Self-pay | Admitting: Internal Medicine

## 2020-12-21 ENCOUNTER — Other Ambulatory Visit: Payer: Self-pay

## 2020-12-21 VITALS — BP 178/87 | HR 64 | Temp 97.5°F | Ht 72.0 in | Wt 232.0 lb

## 2020-12-21 DIAGNOSIS — K754 Autoimmune hepatitis: Secondary | ICD-10-CM

## 2020-12-21 DIAGNOSIS — K219 Gastro-esophageal reflux disease without esophagitis: Secondary | ICD-10-CM | POA: Diagnosis not present

## 2020-12-21 MED ORDER — PREDNISONE 10 MG PO TABS: 20.0000 mg | ORAL_TABLET | Freq: Every day | ORAL | 5 refills | Status: DC

## 2020-12-21 NOTE — Patient Instructions (Signed)
I am going to check blood work today in regards to your liver.  I want you to restart prednisone 20 mg daily and have sent this to your pharmacy.  We will also start the process of transitioning to a different medication called azathioprine.  I will call you with lab results and discuss next steps.  Otherwise follow-up in 6 to 8 weeks.  At [facility] we value your feedback. You may receive a survey about your visit today. Please share your experience as we strive to create trusting relationships with our patients to provide genuine, compassionate, quality care.  We appreciate your understanding and patience as we review any laboratory studies, imaging, and other diagnostic tests that are ordered as we care for you. Our office policy is 5 business days for review of these results, and any emergent or urgent results are addressed in a timely manner for your best interest. If you do not hear from our office in 1 week, please contact us.   We also encourage the use of MyChart, which contains your medical information for your review as well. If you are not enrolled in this feature, an access code is on this after visit summary for your convenience. Thank you for allowing [place] to be involved in your care.  It was great to see you today!  I hope you have a great rest of your spring!!    Elon Alas. Abbey Chatters, D.O. Gastroenterology and Hepatology [facility]

## 2020-12-21 NOTE — Progress Notes (Signed)
Referring Provider: Celene Squibb, MD Primary Care Physician:  Celene Squibb, MD Primary GI:  Dr. Abbey Chatters  Chief Complaint  Patient presents with  . Gastroesophageal Reflux    HPI:   Douglas Mason is a 70 y.o. male who presents to the clinic today for follow-up visit.  Previously referred for abnormal LFTs.  Subsequent work-up as follows:  Ultrasound with elastography 10/04/2020 showed coarsened hepatic echotexture with subtle contour nodularity suspicious for early mild hepatic cirrhosis, and median kPA 8.1.    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    Subsequent liver biopsy 10/12/2020 with patchy but prominent portal and lobular inflammation composed of lymphocytes and plasma cells with accompanying moderate to focally severe interface hepatitis and associated periportal hepatocyte necrosis.  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  Also noted patchy advanced fibrosis  On 10/20/2020, patient was placed on prednisone 60 mg daily for 1 week then 40 mg daily for 1 week then 30 mg daily for 1 week and has been on 20 mg daily since that time.  He states he ran out of prednisone this week and has been without it for a few days.    LFTs 11/13/2020 showed normalization of his AST 27, ALT 46, T bili 0.9 alk phos 49.  No complaints for me today.  Past Medical History:  Diagnosis Date  . Bronchitis   . Diabetes mellitus without complication ([place])   . GERD (gastroesophageal reflux disease)   . High cholesterol   . Hypertension   . Pleurisy   . Pneumonia   . Sleep apnea    nurse in pre-op observed pt sats drop to 80-82% while sleeping on RA,wake up would go to 87%    Past Surgical History:  Procedure Laterality Date  . CHOLECYSTECTOMY N/A 01/13/2019   Procedure: LAPAROSCOPIC CHOLECYSTECTOMY;  Surgeon: Virl Cagey, MD;  Location: [facility];  Service: General;  Laterality: N/A;    Current Outpatient Medications  Medication Sig Dispense Refill  .  albuterol (PROVENTIL HFA;VENTOLIN HFA) 108 (90 BASE) MCG/ACT inhaler Inhale 2 puffs into the lungs every 4 (four) hours as needed for wheezing or shortness of breath. 1 Inhaler 1  . albuterol (PROVENTIL) (2.5 MG/3ML) 0.083% nebulizer solution Take 3 mLs (2.5 mg total) by nebulization every 6 (six) hours as needed for wheezing or shortness of breath. 75 mL 12  . ALPRAZolam (XANAX) 0.5 MG tablet Take 0.5 mg by mouth at bedtime as needed for anxiety.    Marland Kitchen aspirin EC 81 MG tablet Take 81 mg by mouth daily.    Marland Kitchen atorvastatin (LIPITOR) 40 MG tablet Take 20 mg by mouth daily.    Marland Kitchen glipiZIDE (GLUCOTROL XL) 10 MG 24 hr tablet Take 1 tablet by mouth 2 (two) times a day.    . losartan (COZAAR) 50 MG tablet Take 50 mg by mouth daily.    . metFORMIN (GLUCOPHAGE-XR) 500 MG 24 hr tablet Take 2 tablets (1,000 mg total) by mouth 2 (two) times a day.    . Multiple Vitamins-Minerals (MULTIVITAMINS THER. W/MINERALS) TABS tablet Take 1 tablet by mouth daily.    . pantoprazole (PROTONIX) 40 MG tablet Take 1 tablet (40 mg total) by mouth 2 (two) times daily before a meal. (Patient taking differently: Take 40 mg by mouth at bedtime.) 60 tablet 0  . predniSONE (DELTASONE) 10 MG tablet Take 2 tablets (20 mg total) by mouth daily with breakfast. 60 tablet 5  . propranolol ER (INDERAL LA) 60  MG 24 hr capsule Take 60 mg by mouth daily.     No current facility-administered medications for this visit.    Allergies as of 12/21/2020 - Review Complete 12/21/2020  Allergen Reaction Noted  . Lisinopril Cough 10/09/2020    Family History  Problem Relation Age of Onset  . Asthma Mother   . Leukemia Mother   . Rheum arthritis Mother   . Rheum arthritis Maternal Grandmother     Social History   Socioeconomic History  . Marital status: Married    Spouse name: Not on file  . Number of children: Not on file  . Years of education: Not on file  . Highest education level: Not on file  Occupational History    Employer: [employer]     Comment: [employer]  Tobacco Use  . Smoking status: Former Smoker    Packs/day: 2.00    Years: 30.00    Pack years: 60.00    Types: Cigarettes    Quit date: 09/02/1976    Years since quitting: 44.3  . Smokeless tobacco: Former Systems developer    Types: [place] date: 09/03/2003  Vaping Use  . Vaping Use: Never used  Substance and Sexual Activity  . Alcohol use: No  . Drug use: No  . Sexual activity: Yes  Other Topics Concern  . Not on file  Social History Narrative  . Not on file   Social Determinants of Health   Financial Resource Strain: Not on file  Food Insecurity: Not on file  Transportation Needs: Not on file  Physical Activity: Not on file  Stress: Not on file  Social Connections: Not on file    Subjective: Review of Systems  Constitutional: Negative for chills and fever.  HENT: Negative for congestion and hearing loss.   Eyes: Negative for blurred vision and double vision.  Respiratory: Negative for cough and shortness of breath.   Cardiovascular: Negative for chest pain and palpitations.  Gastrointestinal: Positive for heartburn. Negative for abdominal pain, blood in stool, constipation, diarrhea, melena and vomiting.  Genitourinary: Negative for dysuria and urgency.  Musculoskeletal: Negative for joint pain and myalgias.  Skin: Negative for itching and rash.  Neurological: Negative for dizziness and headaches.  Psychiatric/Behavioral: Negative for depression. The patient is not nervous/anxious.      Objective: BP (!) 178/87   Pulse 64   Temp (!) 97.5 F (36.4 C) (Temporal)   Ht 6' (1.829 m)   Wt 232 lb (105.2 kg)   BMI 31.46 kg/m  Physical Exam Constitutional:      Appearance: Normal appearance.  HENT:     Head: Normocephalic and atraumatic.  Eyes:     Extraocular Movements: Extraocular movements intact.     Conjunctiva/sclera: Conjunctivae normal.  Cardiovascular:     Rate and Rhythm: Normal rate and regular rhythm.  Pulmonary:     Effort:  Pulmonary effort is normal.     Breath sounds: Normal breath sounds.  Abdominal:     General: Bowel sounds are normal.     Palpations: Abdomen is soft.  Musculoskeletal:        General: Normal range of motion.     Cervical back: Normal range of motion and neck supple.  Skin:    General: Skin is warm.  Neurological:     General: No focal deficit present.     Mental Status: He is alert and oriented to person, place, and time.  Psychiatric:        Mood and  Affect: Mood normal.        Behavior: Behavior normal.      Assessment: *Autoimmune hepatitis *Chronic reflux-relatively well controlled on pantoprazole  Plan: Discussed autoimmune hepatitis in depth with patient today.  Discussed long-term implications, risk of relapse, chronic therapy.  We will check TPMT status today in clinic as well as repeat LFTs, IgG, and GGT.  Continue on prednisone 20 mg daily for now.  Assuming his TPMT is normal, we will start him on azathioprine 50 mg daily.  We will need to monitor his CBC and LFTs every 2 to 4 weeks over the next few months as we slowly trying to taper off his prednisone to 0.  Patient is agreeable.  Chronic reflux relatively well controlled on pantoprazole.  We will continue.  12/21/2020 2:04 PM   Disclaimer: This note was dictated with voice recognition software. Similar sounding words can inadvertently be transcribed and may not be corrected upon review.

## 2020-12-28 LAB — IGG: IgG (Immunoglobin G), Serum: 843 mg/dL (ref 600–1540)

## 2020-12-28 LAB — HEPATIC FUNCTION PANEL
AG Ratio: 1.7 (calc) (ref 1.0–2.5)
ALT: 32 U/L (ref 9–46)
AST: 27 U/L (ref 10–35)
Albumin: 4 g/dL (ref 3.6–5.1)
Alkaline phosphatase (APISO): 44 U/L (ref 35–144)
Bilirubin, Direct: 0.2 mg/dL (ref 0.0–0.2)
Globulin: 2.4 g/dL (calc) (ref 1.9–3.7)
Indirect Bilirubin: 0.8 mg/dL (calc) (ref 0.2–1.2)
Total Bilirubin: 1 mg/dL (ref 0.2–1.2)
Total Protein: 6.4 g/dL (ref 6.1–8.1)

## 2020-12-28 LAB — THIOPURINE METHYLTRANSFERASE (TPMT), RBC: Thiopurine Methyltransferase, RBC: 15 nmol/hr/mL RBC

## 2020-12-28 LAB — GAMMA GT: GGT: 50 U/L (ref 3–70)

## 2020-12-29 DIAGNOSIS — I10 Essential (primary) hypertension: Secondary | ICD-10-CM | POA: Diagnosis not present

## 2020-12-29 DIAGNOSIS — E1165 Type 2 diabetes mellitus with hyperglycemia: Secondary | ICD-10-CM | POA: Diagnosis not present

## 2020-12-29 DIAGNOSIS — E119 Type 2 diabetes mellitus without complications: Secondary | ICD-10-CM | POA: Diagnosis not present

## 2020-12-29 DIAGNOSIS — E7849 Other hyperlipidemia: Secondary | ICD-10-CM | POA: Diagnosis not present

## 2020-12-29 DIAGNOSIS — E782 Mixed hyperlipidemia: Secondary | ICD-10-CM | POA: Diagnosis not present

## 2020-12-29 DIAGNOSIS — Z125 Encounter for screening for malignant neoplasm of prostate: Secondary | ICD-10-CM | POA: Diagnosis not present

## 2020-12-31 DIAGNOSIS — E782 Mixed hyperlipidemia: Secondary | ICD-10-CM | POA: Diagnosis not present

## 2020-12-31 DIAGNOSIS — R251 Tremor, unspecified: Secondary | ICD-10-CM | POA: Diagnosis not present

## 2020-12-31 DIAGNOSIS — E669 Obesity, unspecified: Secondary | ICD-10-CM | POA: Diagnosis not present

## 2020-12-31 DIAGNOSIS — B179 Acute viral hepatitis, unspecified: Secondary | ICD-10-CM | POA: Diagnosis not present

## 2020-12-31 DIAGNOSIS — E1165 Type 2 diabetes mellitus with hyperglycemia: Secondary | ICD-10-CM | POA: Diagnosis not present

## 2020-12-31 DIAGNOSIS — I10 Essential (primary) hypertension: Secondary | ICD-10-CM | POA: Diagnosis not present

## 2020-12-31 DIAGNOSIS — K219 Gastro-esophageal reflux disease without esophagitis: Secondary | ICD-10-CM | POA: Diagnosis not present

## 2020-12-31 DIAGNOSIS — E119 Type 2 diabetes mellitus without complications: Secondary | ICD-10-CM | POA: Diagnosis not present

## 2021-01-04 DIAGNOSIS — R251 Tremor, unspecified: Secondary | ICD-10-CM | POA: Diagnosis not present

## 2021-01-04 DIAGNOSIS — E782 Mixed hyperlipidemia: Secondary | ICD-10-CM | POA: Diagnosis not present

## 2021-01-04 DIAGNOSIS — E119 Type 2 diabetes mellitus without complications: Secondary | ICD-10-CM | POA: Diagnosis not present

## 2021-01-04 DIAGNOSIS — B179 Acute viral hepatitis, unspecified: Secondary | ICD-10-CM | POA: Diagnosis not present

## 2021-01-04 DIAGNOSIS — K219 Gastro-esophageal reflux disease without esophagitis: Secondary | ICD-10-CM | POA: Diagnosis not present

## 2021-01-04 DIAGNOSIS — I10 Essential (primary) hypertension: Secondary | ICD-10-CM | POA: Diagnosis not present

## 2021-01-04 DIAGNOSIS — E669 Obesity, unspecified: Secondary | ICD-10-CM | POA: Diagnosis not present

## 2021-01-14 ENCOUNTER — Ambulatory Visit
Admission: EM | Admit: 2021-01-14 | Discharge: 2021-01-14 | Disposition: A | Discharge status: Home / Self Care | Payer: Medicare Other | Attending: Family Medicine | Admitting: Family Medicine

## 2021-01-14 ENCOUNTER — Other Ambulatory Visit: Payer: Self-pay

## 2021-01-14 ENCOUNTER — Encounter: Payer: Self-pay | Admitting: Emergency Medicine

## 2021-01-14 DIAGNOSIS — S61011A Laceration without foreign body of right thumb without damage to nail, initial encounter: Secondary | ICD-10-CM

## 2021-01-14 MED ORDER — TETANUS-DIPHTH-ACELL PERTUSSIS 5-2.5-18.5 LF-MCG/0.5 IM SUSY
0.5000 mL | PREFILLED_SYRINGE | Freq: Once | INTRAMUSCULAR | Status: AC
  Administered 2021-01-14: 0.5 mL via INTRAMUSCULAR

## 2021-01-14 NOTE — Discharge Instructions (Signed)
I have applied silver nitrate to the area to help stop the bleeding.   You may use ice to the area, you may also take tylenol as needed for pain  Follow up with any drainage from the area, redness, increased swelling, heat, pain  Follow up with this office or with primary care if symptoms are persisting.  Follow up in the ER for high fever, trouble swallowing, trouble breathing, other concerning symptoms.'

## 2021-01-14 NOTE — ED Triage Notes (Signed)
Cut right thumb with a pocket knife today.

## 2021-01-17 NOTE — ED Provider Notes (Signed)
[facility]    CSN: [id number] Arrival date & time: 01/14/21  1359      History   Chief Complaint No chief complaint on file.   HPI Douglas Mason is a 70 y.o. male.   Reports that he cut the end of his right thumb with a pocket knife today.  Cannot recall last Tdap vaccine.  Denies foreign body from the area.  Denies nail damage as well.  States that the area is still bleeding, he has been applying pressure to help control this.  Has cleaned the area with water.  Denies numbness, tingling, foreign bodies, drainage from the area.  ROS per HPI  The history is provided by the patient.    Past Medical History:  Diagnosis Date  . Bronchitis   . Diabetes mellitus without complication ([place])   . GERD (gastroesophageal reflux disease)   . High cholesterol   . Hypertension   . Pleurisy   . Pneumonia   . Sleep apnea    nurse in pre-op observed pt sats drop to 80-82% while sleeping on RA,wake up would go to 87%    Patient Active Problem List   Diagnosis Date Noted  . Acute cholecystitis 01/13/2019  . Anxiety 01/13/2019  . Calculus of gallbladder with acute cholecystitis without obstruction   . Dyspnea 08/04/2014  . OSA (obstructive sleep apnea) 05/02/2014  . Upper airway cough syndrome 03/16/2014  . CAP (community acquired pneumonia) 03/15/2014  . Atrial flutter ([place]) 03/15/2014  . Pericardial effusion by CT, not seen by echo 03/15/2014  . Chest pain with moderate risk of acute coronary syndrome 03/13/2014  . Essential hypertension 03/13/2014  . Diabetes mellitus type II, non insulin dependent ([place]) 03/13/2014  . GERD (gastroesophageal reflux disease) 03/13/2014    Past Surgical History:  Procedure Laterality Date  . CHOLECYSTECTOMY N/A 01/13/2019   Procedure: LAPAROSCOPIC CHOLECYSTECTOMY;  Surgeon: Virl Cagey, MD;  Location: [facility];  Service: General;  Laterality: N/A;       Home Medications    Prior to Admission medications   Medication  Sig Start Date End Date Taking? Authorizing Provider  albuterol (PROVENTIL HFA;VENTOLIN HFA) 108 (90 BASE) MCG/ACT inhaler Inhale 2 puffs into the lungs every 4 (four) hours as needed for wheezing or shortness of breath. 06/28/14   Virgel Manifold, MD  albuterol (PROVENTIL) (2.5 MG/3ML) 0.083% nebulizer solution Take 3 mLs (2.5 mg total) by nebulization every 6 (six) hours as needed for wheezing or shortness of breath. 08/18/14   Parrett, Fonnie Mu, NP  ALPRAZolam Duanne Moron) 0.5 MG tablet Take 0.5 mg by mouth at bedtime as needed for anxiety. 01/12/14   [provider]  aspirin EC 81 MG tablet Take 81 mg by mouth daily.    [provider]  atorvastatin (LIPITOR) 40 MG tablet Take 20 mg by mouth daily.    [provider]  glipiZIDE (GLUCOTROL XL) 10 MG 24 hr tablet Take 1 tablet by mouth 2 (two) times a day. 12/04/18   [provider]  losartan (COZAAR) 50 MG tablet Take 50 mg by mouth daily.    [provider]  metFORMIN (GLUCOPHAGE-XR) 500 MG 24 hr tablet Take 2 tablets (1,000 mg total) by mouth 2 (two) times a day. 01/16/19   Virl Cagey, MD  Multiple Vitamins-Minerals (MULTIVITAMINS THER. W/MINERALS) TABS tablet Take 1 tablet by mouth daily.    [provider]  pantoprazole (PROTONIX) 40 MG tablet Take 1 tablet (40 mg total) by mouth 2 (two)  times daily before a meal. Patient taking differently: Take 40 mg by mouth at bedtime. 03/17/14   Kelby Aline, MD  predniSONE (DELTASONE) 10 MG tablet Take 2 tablets (20 mg total) by mouth daily with breakfast. 12/21/20 06/19/21  Eloise Harman, DO  propranolol ER (INDERAL LA) 60 MG 24 hr capsule Take 60 mg by mouth daily. 09/20/20   [provider]    Family History Family History  Problem Relation Age of Onset  . Asthma Mother   . Leukemia Mother   . Rheum arthritis Mother   . Rheum arthritis Maternal Grandmother     Social History Social History   Tobacco Use  . Smoking status:  Former Smoker    Packs/day: 2.00    Years: 30.00    Pack years: 60.00    Types: Cigarettes    Quit date: 09/02/1976    Years since quitting: 44.4  . Smokeless tobacco: Former Systems developer    Types: [place] date: 09/03/2003  Vaping Use  . Vaping Use: Never used  Substance Use Topics  . Alcohol use: No  . Drug use: No     Allergies   Lisinopril   Review of Systems Review of Systems   Physical Exam Triage Vital Signs ED Triage Vitals  Enc Vitals Group     BP 01/14/21 1419 (!) 165/82     Pulse Rate 01/14/21 1419 67     Resp 01/14/21 1419 14     Temp 01/14/21 1419 98 F (36.7 C)     Temp Source 01/14/21 1419 Oral     SpO2 01/14/21 1419 96 %     Weight --      Height --      Head Circumference --      Peak Flow --      Pain Score 01/14/21 1426 0     Pain Loc --      Pain Edu? --      Excl. in [place]? --    No data found.  Updated Vital Signs BP (!) 165/82 (BP Location: Left Arm)   Pulse 67   Temp 98 F (36.7 C) (Oral)   Resp 14   SpO2 96%   Visual Acuity Right Eye Distance:   Left Eye Distance:   Bilateral Distance:    Right Eye Near:   Left Eye Near:    Bilateral Near:     Physical Exam Vitals and nursing note reviewed.  Constitutional:      General: He is not in acute distress.    Appearance: Normal appearance. He is well-developed and normal weight. He is not ill-appearing.  HENT:     Head: Normocephalic and atraumatic.     Nose: Nose normal.     Mouth/Throat:     Mouth: Mucous membranes are moist.     Pharynx: Oropharynx is clear.  Eyes:     Extraocular Movements: Extraocular movements intact.     Conjunctiva/sclera: Conjunctivae normal.     Pupils: Pupils are equal, round, and reactive to light.  Cardiovascular:     Rate and Rhythm: Normal rate.  Pulmonary:     Effort: Pulmonary effort is normal.  Musculoskeletal:        General: Normal range of motion.     Cervical back: Normal range of motion and neck supple.  Skin:    General: Skin is  warm and dry.     Capillary Refill: Capillary refill takes less than 2 seconds.  Findings: Laceration present.     Comments: Shearing laceration to tip of the right thumb  Neurological:     General: No focal deficit present.     Mental Status: He is alert and oriented to person, place, and time.  Psychiatric:        Mood and Affect: Mood normal.        Behavior: Behavior normal.        Thought Content: Thought content normal.      UC Treatments / Results  Labs (all labs ordered are listed, but only abnormal results are displayed) Labs Reviewed - No data to display  EKG   Radiology No results found.  Procedures Laceration Repair  Date/Time: 01/17/2021 5:01 PM Performed by: Faustino Congress, NP Authorized by: Faustino Congress, NP   Consent:    Consent obtained:  Verbal   Consent given by:  Patient   Risks discussed:  Infection and pain   Alternatives discussed:  Observation Universal protocol:    Patient identity confirmed:  Verbally with patient and arm band Anesthesia:    Anesthesia method:  Topical application   Topical anesthetic:  LET Laceration details:    Location:  Finger   Finger location:  R thumb Exploration:    Hemostasis achieved with:  Cautery (Silver nitrate) Treatment:    Area cleansed with:  Shur-Clens   Amount of cleaning:  Standard   Irrigation method:  Syringe   Debridement:  None   Undermining:  None Repair type:    Repair type:  Simple Post-procedure details:    Dressing:  Bulky dressing   Procedure completion:  Tolerated well, no immediate complications   (including critical care time)  Medications Ordered in UC Medications  Tdap (BOOSTRIX) injection 0.5 mL (0.5 mLs Intramuscular Given 01/14/21 1453)    Initial Impression / Assessment and Plan / UC Course  I have reviewed the triage vital signs and the nursing notes.  Pertinent labs & imaging results that were available during my care of the patient were reviewed by me  and considered in my medical decision making (see chart for details).    Right thumb laceration  Applied silver nitrate chemical cautery May use ice to the area May take Tylenol as needed for pain Follow up with this office or with primary care for increased swelling, redness, tenderness, warmth, drainage from the area.  Follow up in the ER for red streaking up your hand, high fever, trouble swallowing, trouble breathing, other concerning symptoms.   Final Clinical Impressions(s) / UC Diagnoses   Final diagnoses:  Thumb laceration, right, initial encounter     Discharge Instructions     I have applied silver nitrate to the area to help stop the bleeding.   You may use ice to the area, you may also take tylenol as needed for pain  Follow up with any drainage from the area, redness, increased swelling, heat, pain  Follow up with this office or with primary care if symptoms are persisting.  Follow up in the ER for high fever, trouble swallowing, trouble breathing, other concerning symptoms.'    ED Prescriptions    None     PDMP not reviewed this encounter.   Faustino Congress, NP 01/17/21 1703

## 2021-01-30 DIAGNOSIS — K219 Gastro-esophageal reflux disease without esophagitis: Secondary | ICD-10-CM | POA: Diagnosis not present

## 2021-01-30 DIAGNOSIS — E1165 Type 2 diabetes mellitus with hyperglycemia: Secondary | ICD-10-CM | POA: Diagnosis not present

## 2021-02-14 ENCOUNTER — Other Ambulatory Visit: Payer: Self-pay

## 2021-02-14 ENCOUNTER — Other Ambulatory Visit: Payer: Self-pay | Admitting: *Deleted

## 2021-02-14 ENCOUNTER — Encounter: Payer: Self-pay | Admitting: Internal Medicine

## 2021-02-14 ENCOUNTER — Ambulatory Visit (INDEPENDENT_AMBULATORY_CARE_PROVIDER_SITE_OTHER): Payer: Medicare Other | Admitting: Internal Medicine

## 2021-02-14 VITALS — BP 132/77 | HR 64 | Temp 97.3°F | Ht 72.0 in | Wt 225.8 lb

## 2021-02-14 DIAGNOSIS — R932 Abnormal findings on diagnostic imaging of liver and biliary tract: Secondary | ICD-10-CM

## 2021-02-14 DIAGNOSIS — K219 Gastro-esophageal reflux disease without esophagitis: Secondary | ICD-10-CM

## 2021-02-14 DIAGNOSIS — R7989 Other specified abnormal findings of blood chemistry: Secondary | ICD-10-CM

## 2021-02-14 DIAGNOSIS — K754 Autoimmune hepatitis: Secondary | ICD-10-CM

## 2021-02-14 DIAGNOSIS — K759 Inflammatory liver disease, unspecified: Secondary | ICD-10-CM

## 2021-02-14 MED ORDER — AZATHIOPRINE 50 MG PO TABS: 50.0000 mg | ORAL_TABLET | Freq: Every day | ORAL | 5 refills | Status: DC

## 2021-02-14 NOTE — Patient Instructions (Signed)
I am going to start you on a new medication called azathioprine 50 mg daily.  After 2 weeks of being on this, we will need to check your blood work to evaluate liver tests as well as blood counts.  At 4 weeks 03/21/21, you can decrease your prednisone to 15 mg (1.5 tablets) daily.  After 6 weeks 04/04/2021 we will need to check blood work again.    After 8 weeks  04/18/2021 you can decrease your prednisone to 10 mg daily (1 tablet)  After 10 weeks 05/02/21 we will need to check blood work again.  At 12 weeks 05/16/21 you can decrease your prednisone to 5 mg daily (half a tablet)  At 14 weeks 05/30/21 we will need to check blood work again.  At 16 weeks 06/13/21 you can stop your prednisone altogether.  At 18 weeks 06/27/21 we will check blood work again.  If everything looks good then you will stay on azathioprine indefinitely for 12 to 18 months at which point we can discuss potentially weaning you off completely.  Follow up with GI in 6-8 weeks.   At [facility] we value your feedback. You may receive a survey about your visit today. Please share your experience as we strive to create trusting relationships with our patients to provide genuine, compassionate, quality care.  We appreciate your understanding and patience as we review any laboratory studies, imaging, and other diagnostic tests that are ordered as we care for you. Our office policy is 5 business days for review of these results, and any emergent or urgent results are addressed in a timely manner for your best interest. If you do not hear from our office in 1 week, please contact us.   We also encourage the use of MyChart, which contains your medical information for your review as well. If you are not enrolled in this feature, an access code is on this after visit summary for your convenience. Thank you for allowing [place] to be involved in your care.  It was great to see you today!  I hope you have a great rest of your  summer!!    Douglas Mason. Abbey Chatters, D.O. Gastroenterology and Hepatology [facility]

## 2021-02-15 DIAGNOSIS — K754 Autoimmune hepatitis: Secondary | ICD-10-CM | POA: Insufficient documentation

## 2021-02-15 NOTE — Progress Notes (Signed)
Referring Provider: Celene Squibb, MD Primary Care Physician:  Celene Squibb, MD Primary GI:  Dr. Abbey Chatters  Chief Complaint  Patient presents with   Gastroesophageal Reflux    Doing ok    HPI:   Douglas Mason is a 70 y.o. male  who presents to the clinic today for follow-up visit.  Previously referred for abnormal LFTs.  Subsequent work-up as follows:   Ultrasound with elastography 10/04/2020 showed coarsened hepatic echotexture with subtle contour nodularity suspicious for early mild hepatic cirrhosis, and median kPA 8.1.   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     Subsequent liver biopsy 10/12/2020 with patchy but prominent portal and lobular inflammation composed of lymphocytes and plasma cells with accompanying moderate to focally severe interface hepatitis and associated periportal hepatocyte necrosis.  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  Also noted patchy advanced fibrosis   On 10/20/2020, patient was placed on prednisone 60 mg daily for 1 week then 40 mg daily for 1 week then 30 mg daily for 1 week and has been on 20 mg daily since that time.     LFTs 11/13/2020 showed normalization of his AST 27, ALT 46, T bili 0.9 alk phos 49.  Patient ran out of prednisone for a few weeks and did not call our office.  I restarted him on prednisone 20 mg daily after previous visit and LFTs 12/21/2020 showed normalization.   No complaints for me today.  Currently doing well.  Continues to take prednisone 20 mg daily.  Past Medical History:  Diagnosis Date   Bronchitis    Diabetes mellitus without complication (HCC)    GERD (gastroesophageal reflux disease)    High cholesterol    Hypertension    Pleurisy    Pneumonia    Sleep apnea    nurse in pre-op observed pt sats drop to 80-82% while sleeping on RA,wake up would go to 87%    Past Surgical History:  Procedure Laterality Date   CHOLECYSTECTOMY N/A 01/13/2019   Procedure: LAPAROSCOPIC CHOLECYSTECTOMY;   Surgeon: Virl Cagey, MD;  Location: [facility];  Service: General;  Laterality: N/A;    Current Outpatient Medications  Medication Sig Dispense Refill   albuterol (PROVENTIL HFA;VENTOLIN HFA) 108 (90 BASE) MCG/ACT inhaler Inhale 2 puffs into the lungs every 4 (four) hours as needed for wheezing or shortness of breath. 1 Inhaler 1   albuterol (PROVENTIL) (2.5 MG/3ML) 0.083% nebulizer solution Take 3 mLs (2.5 mg total) by nebulization every 6 (six) hours as needed for wheezing or shortness of breath. 75 mL 12   ALPRAZolam (XANAX) 0.5 MG tablet Take 0.5 mg by mouth at bedtime as needed for anxiety.     aspirin EC 81 MG tablet Take 81 mg by mouth daily.     atorvastatin (LIPITOR) 40 MG tablet Take 20 mg by mouth daily.     azaTHIOprine (IMURAN) 50 MG tablet Take 1 tablet (50 mg total) by mouth daily. 30 tablet 5   glipiZIDE (GLUCOTROL XL) 10 MG 24 hr tablet Take 1 tablet by mouth 2 (two) times a day.     losartan (COZAAR) 50 MG tablet Take 50 mg by mouth daily.     metFORMIN (GLUCOPHAGE-XR) 500 MG 24 hr tablet Take 2 tablets (1,000 mg total) by mouth 2 (two) times a day.     Multiple Vitamins-Minerals (MULTIVITAMINS THER. W/MINERALS) TABS tablet Take 1 tablet by mouth daily.     pantoprazole (PROTONIX) 40 MG tablet Take  1 tablet (40 mg total) by mouth 2 (two) times daily before a meal. (Patient taking differently: Take 40 mg by mouth at bedtime.) 60 tablet 0   predniSONE (DELTASONE) 10 MG tablet Take 2 tablets (20 mg total) by mouth daily with breakfast. 60 tablet 5   propranolol ER (INDERAL LA) 60 MG 24 hr capsule Take 60 mg by mouth daily.     No current facility-administered medications for this visit.    Allergies as of 02/14/2021 - Review Complete 02/14/2021  Allergen Reaction Noted   Lisinopril Cough 10/09/2020    Family History  Problem Relation Age of Onset   Asthma Mother    Leukemia Mother    Rheum arthritis Mother    Rheum arthritis Maternal Grandmother     Social  History   Socioeconomic History   Marital status: Married    Spouse name: Not on file   Number of children: Not on file   Years of education: Not on file   Highest education level: Not on file  Occupational History    Employer: [employer]    Comment: [employer]  Tobacco Use   Smoking status: Former    Packs/day: 2.00    Years: 30.00    Pack years: 60.00    Types: Cigarettes    Quit date: 09/02/1976    Years since quitting: 44.4   Smokeless tobacco: Former    Types: Chew    Quit date: 09/03/2003  Vaping Use   Vaping Use: Never used  Substance and Sexual Activity   Alcohol use: No   Drug use: No   Sexual activity: Yes  Other Topics Concern   Not on file  Social History Narrative   Not on file   Social Determinants of Health   Financial Resource Strain: Not on file  Food Insecurity: Not on file  Transportation Needs: Not on file  Physical Activity: Not on file  Stress: Not on file  Social Connections: Not on file    Subjective: Review of Systems  Constitutional:  Negative for chills and fever.  HENT:  Negative for congestion and hearing loss.   Eyes:  Negative for blurred vision and double vision.  Respiratory:  Negative for cough and shortness of breath.   Cardiovascular:  Negative for chest pain and palpitations.  Gastrointestinal:  Negative for abdominal pain, blood in stool, constipation, diarrhea, heartburn, melena and vomiting.  Genitourinary:  Negative for dysuria and urgency.  Musculoskeletal:  Negative for joint pain and myalgias.  Skin:  Negative for itching and rash.  Neurological:  Negative for dizziness and headaches.  Psychiatric/Behavioral:  Negative for depression. The patient is not nervous/anxious.     Objective: BP 132/77   Pulse 64   Temp (!) 97.3 F (36.3 C) (Temporal)   Ht 6' (1.829 m)   Wt 225 lb 12.8 oz (102.4 kg)   BMI 30.62 kg/m  Physical Exam Constitutional:      Appearance: Normal appearance.  HENT:     Head: Normocephalic and  atraumatic.  Eyes:     Extraocular Movements: Extraocular movements intact.     Conjunctiva/sclera: Conjunctivae normal.  Cardiovascular:     Rate and Rhythm: Normal rate and regular rhythm.  Pulmonary:     Effort: Pulmonary effort is normal.     Breath sounds: Normal breath sounds.  Abdominal:     General: Bowel sounds are normal.     Palpations: Abdomen is soft.  Musculoskeletal:        General: Normal range  of motion.     Cervical back: Normal range of motion and neck supple.  Skin:    General: Skin is warm.  Neurological:     General: No focal deficit present.     Mental Status: He is alert and oriented to person, place, and time.  Psychiatric:        Mood and Affect: Mood normal.        Behavior: Behavior normal.     Assessment: *Autoimmune hepatitis-remission *Chronic GERD-well-controlled on pantoprazole  Plan: Discussed autoimmune hepatitis in depth with patient today.  Discussed long-term implications, risk of relapse, chronic therapy.  TPMT status normal.  LFTs, IgG, and GGT have normalized.  I will start to transition him to azathioprine today.  We will start him on 50 mg today with the following schedule over the next 12 weeks:    After 2 weeks of being on azathioprine, we will check CBC, LFTs, Lipase  At 4 weeks 03/21/21, will decrease prednisone to 15 mg daily.  After 6 weeks 04/04/2021 we will repeat LFTs and CBC and Lipase   After 8 weeks  04/18/2021 we will decrease prednisone to 10 mg daily   After 10 weeks 05/02/21 we will check LFTs, CBC  At 12 weeks 05/16/21 we will decrease prednisone to 5 mg daily   At 14 weeks 05/30/21 we will check CBC, LFTs  At 16 weeks 06/13/21 we will stop prednisone  At 18 weeks 06/27/21 we will check blood work again.  If everything looks good then will stay on azathioprine indefinitely for 12 to 18 months at which point we can discuss potentially weaning off.  We may need to increase azathioprine up to 100 mg daily  depending on clinical course above.  Follow up with GI in 6-8 weeks.   02/15/2021 12:05 PM   Disclaimer: This note was dictated with voice recognition software. Similar sounding words can inadvertently be transcribed and may not be corrected upon review.

## 2021-03-07 DIAGNOSIS — K759 Inflammatory liver disease, unspecified: Secondary | ICD-10-CM | POA: Diagnosis not present

## 2021-03-07 DIAGNOSIS — K754 Autoimmune hepatitis: Secondary | ICD-10-CM | POA: Diagnosis not present

## 2021-03-07 DIAGNOSIS — R945 Abnormal results of liver function studies: Secondary | ICD-10-CM | POA: Diagnosis not present

## 2021-03-07 DIAGNOSIS — R932 Abnormal findings on diagnostic imaging of liver and biliary tract: Secondary | ICD-10-CM | POA: Diagnosis not present

## 2021-03-07 LAB — CBC WITH DIFFERENTIAL/PLATELET
Absolute Monocytes: 602 cells/uL (ref 200–950)
Basophils Absolute: 56 cells/uL (ref 0–200)
Basophils Relative: 0.6 %
Eosinophils Absolute: 122 cells/uL (ref 15–500)
Eosinophils Relative: 1.3 %
HCT: 40.1 % (ref 38.5–50.0)
Hemoglobin: 13.8 g/dL (ref 13.2–17.1)
Lymphs Abs: 2735 cells/uL (ref 850–3900)
MCH: 33.1 pg — ABNORMAL HIGH (ref 27.0–33.0)
MCHC: 34.4 g/dL (ref 32.0–36.0)
MCV: 96.2 fL (ref 80.0–100.0)
MPV: 10.3 fL (ref 7.5–12.5)
Monocytes Relative: 6.4 %
Neutro Abs: 5884 cells/uL (ref 1500–7800)
Neutrophils Relative %: 62.6 %
Platelets: 274 10*3/uL (ref 140–400)
RBC: 4.17 10*6/uL — ABNORMAL LOW (ref 4.20–5.80)
RDW: 13.6 % (ref 11.0–15.0)
Total Lymphocyte: 29.1 %
WBC: 9.4 10*3/uL (ref 3.8–10.8)

## 2021-04-04 DIAGNOSIS — K754 Autoimmune hepatitis: Secondary | ICD-10-CM | POA: Diagnosis not present

## 2021-04-04 DIAGNOSIS — K759 Inflammatory liver disease, unspecified: Secondary | ICD-10-CM | POA: Diagnosis not present

## 2021-04-04 DIAGNOSIS — R932 Abnormal findings on diagnostic imaging of liver and biliary tract: Secondary | ICD-10-CM | POA: Diagnosis not present

## 2021-04-04 DIAGNOSIS — R945 Abnormal results of liver function studies: Secondary | ICD-10-CM | POA: Diagnosis not present

## 2021-04-04 LAB — HEPATIC FUNCTION PANEL
AG Ratio: 1.8 (calc) (ref 1.0–2.5)
ALT: 29 U/L (ref 9–46)
AST: 17 U/L (ref 10–35)
Albumin: 4.3 g/dL (ref 3.6–5.1)
Alkaline phosphatase (APISO): 41 U/L (ref 35–144)
Bilirubin, Direct: 0.2 mg/dL (ref 0.0–0.2)
Globulin: 2.4 g/dL (calc) (ref 1.9–3.7)
Indirect Bilirubin: 0.6 mg/dL (calc) (ref 0.2–1.2)
Total Bilirubin: 0.8 mg/dL (ref 0.2–1.2)
Total Protein: 6.7 g/dL (ref 6.1–8.1)

## 2021-04-06 DIAGNOSIS — E119 Type 2 diabetes mellitus without complications: Secondary | ICD-10-CM | POA: Diagnosis not present

## 2021-04-06 DIAGNOSIS — R972 Elevated prostate specific antigen [PSA]: Secondary | ICD-10-CM | POA: Diagnosis not present

## 2021-04-06 DIAGNOSIS — I10 Essential (primary) hypertension: Secondary | ICD-10-CM | POA: Diagnosis not present

## 2021-04-13 DIAGNOSIS — E669 Obesity, unspecified: Secondary | ICD-10-CM | POA: Diagnosis not present

## 2021-04-13 DIAGNOSIS — F419 Anxiety disorder, unspecified: Secondary | ICD-10-CM | POA: Diagnosis not present

## 2021-04-13 DIAGNOSIS — K219 Gastro-esophageal reflux disease without esophagitis: Secondary | ICD-10-CM | POA: Diagnosis not present

## 2021-04-13 DIAGNOSIS — E119 Type 2 diabetes mellitus without complications: Secondary | ICD-10-CM | POA: Diagnosis not present

## 2021-04-13 DIAGNOSIS — I10 Essential (primary) hypertension: Secondary | ICD-10-CM | POA: Diagnosis not present

## 2021-04-13 DIAGNOSIS — R251 Tremor, unspecified: Secondary | ICD-10-CM | POA: Diagnosis not present

## 2021-04-13 DIAGNOSIS — Z0001 Encounter for general adult medical examination with abnormal findings: Secondary | ICD-10-CM | POA: Diagnosis not present

## 2021-04-13 DIAGNOSIS — E782 Mixed hyperlipidemia: Secondary | ICD-10-CM | POA: Diagnosis not present

## 2021-05-02 DIAGNOSIS — R932 Abnormal findings on diagnostic imaging of liver and biliary tract: Secondary | ICD-10-CM | POA: Diagnosis not present

## 2021-05-02 DIAGNOSIS — K754 Autoimmune hepatitis: Secondary | ICD-10-CM | POA: Diagnosis not present

## 2021-05-02 DIAGNOSIS — R945 Abnormal results of liver function studies: Secondary | ICD-10-CM | POA: Diagnosis not present

## 2021-05-02 DIAGNOSIS — K219 Gastro-esophageal reflux disease without esophagitis: Secondary | ICD-10-CM | POA: Diagnosis not present

## 2021-05-02 DIAGNOSIS — E1165 Type 2 diabetes mellitus with hyperglycemia: Secondary | ICD-10-CM | POA: Diagnosis not present

## 2021-05-02 DIAGNOSIS — K759 Inflammatory liver disease, unspecified: Secondary | ICD-10-CM | POA: Diagnosis not present

## 2021-05-02 LAB — CBC WITH DIFFERENTIAL/PLATELET
Absolute Monocytes: 705 cells/uL (ref 200–950)
Basophils Absolute: 57 cells/uL (ref 0–200)
Basophils Relative: 0.7 %
Eosinophils Absolute: 138 cells/uL (ref 15–500)
Eosinophils Relative: 1.7 %
HCT: 40.1 % (ref 38.5–50.0)
Hemoglobin: 13.2 g/dL (ref 13.2–17.1)
Lymphs Abs: 2778 cells/uL (ref 850–3900)
MCH: 31.4 pg (ref 27.0–33.0)
MCHC: 32.9 g/dL (ref 32.0–36.0)
MCV: 95.2 fL (ref 80.0–100.0)
MPV: 10.5 fL (ref 7.5–12.5)
Monocytes Relative: 8.7 %
Neutro Abs: 4423 cells/uL (ref 1500–7800)
Neutrophils Relative %: 54.6 %
Platelets: 266 10*3/uL (ref 140–400)
RBC: 4.21 10*6/uL (ref 4.20–5.80)
RDW: 14 % (ref 11.0–15.0)
Total Lymphocyte: 34.3 %
WBC: 8.1 10*3/uL (ref 3.8–10.8)

## 2021-05-30 DIAGNOSIS — R945 Abnormal results of liver function studies: Secondary | ICD-10-CM | POA: Diagnosis not present

## 2021-05-30 DIAGNOSIS — K754 Autoimmune hepatitis: Secondary | ICD-10-CM | POA: Diagnosis not present

## 2021-05-30 DIAGNOSIS — R932 Abnormal findings on diagnostic imaging of liver and biliary tract: Secondary | ICD-10-CM | POA: Diagnosis not present

## 2021-05-30 DIAGNOSIS — K759 Inflammatory liver disease, unspecified: Secondary | ICD-10-CM | POA: Diagnosis not present

## 2021-05-31 LAB — HEPATIC FUNCTION PANEL
AG Ratio: 1.7 (calc) (ref 1.0–2.5)
ALT: 24 U/L (ref 9–46)
AST: 22 U/L (ref 10–35)
Albumin: 4.4 g/dL (ref 3.6–5.1)
Alkaline phosphatase (APISO): 53 U/L (ref 35–144)
Bilirubin, Direct: 0.2 mg/dL (ref 0.0–0.2)
Globulin: 2.6 g/dL (calc) (ref 1.9–3.7)
Indirect Bilirubin: 0.7 mg/dL (calc) (ref 0.2–1.2)
Total Bilirubin: 0.9 mg/dL (ref 0.2–1.2)
Total Protein: 7 g/dL (ref 6.1–8.1)

## 2021-06-01 DIAGNOSIS — K219 Gastro-esophageal reflux disease without esophagitis: Secondary | ICD-10-CM | POA: Diagnosis not present

## 2021-06-01 DIAGNOSIS — E1165 Type 2 diabetes mellitus with hyperglycemia: Secondary | ICD-10-CM | POA: Diagnosis not present

## 2021-06-06 ENCOUNTER — Other Ambulatory Visit: Payer: Self-pay | Admitting: Internal Medicine

## 2021-06-06 NOTE — Telephone Encounter (Signed)
Please check with patient, per Dr. Ave Filter instructions he should be finishing prednisone on 10/12. He may not need refill.

## 2021-06-06 NOTE — Telephone Encounter (Signed)
Spoke with pt and he does not need any refills.

## 2021-06-12 ENCOUNTER — Ambulatory Visit (INDEPENDENT_AMBULATORY_CARE_PROVIDER_SITE_OTHER): Payer: Medicare Other | Admitting: Gastroenterology

## 2021-06-12 ENCOUNTER — Encounter: Payer: Self-pay | Admitting: Gastroenterology

## 2021-06-12 ENCOUNTER — Other Ambulatory Visit: Payer: Self-pay

## 2021-06-12 VITALS — BP 154/81 | HR 78 | Temp 97.3°F | Ht 72.0 in | Wt 236.6 lb

## 2021-06-12 DIAGNOSIS — K754 Autoimmune hepatitis: Secondary | ICD-10-CM

## 2021-06-12 NOTE — Progress Notes (Signed)
Primary Care Physician: Celene Squibb, MD  Primary Gastroenterologist:  Elon Alas. Abbey Chatters, DO   Chief Complaint  Patient presents with   Hepatitis    Finished steroid yesterday    HPI: Douglas Mason is a 70 y.o. male here for follow-up.  Patient last seen in June.  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  Patient was started on prednisone October 20, 2020.  Initially started on 60 mg daily, decrease by 10 mg daily each week until reached 20 mg daily.  TPMT status was normal.  LFTs, normalized.  At time of last office visit in June he was started on azathioprine 50 mg daily, prednisone taper started.  Please see plan under 02/14/2021 note for complete outline of treatment plan.  Patient states today that he completed steroid yesterday.  Feels good.  No complaints.  He is glad to be off of prednisone.  He had difficulty regulating his sugars and appetite.  He denies any abdominal pain.  Bowel movements are regular.  No blood in the stool or melena.  Prior work-up:  Ultrasound with elastography 10/04/2020 showed coarsened hepatic echotexture with subtle contour nodularity suspicious for early mild hepatic cirrhosis, and median kPa 8.1.   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     Subsequent liver biopsy 10/12/2020 with patchy but prominent portal and lobular inflammation composed of lymphocytes and plasma cells with accompanying moderate to focally severe interface hepatitis and associated periportal hepatocyte necrosis.  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  Also noted patchy advanced fibrosis.  Current Outpatient Medications  Medication Sig Dispense Refill   albuterol (PROVENTIL HFA;VENTOLIN HFA) 108 (90 BASE) MCG/ACT inhaler Inhale 2 puffs into the lungs every 4 (four) hours as needed for wheezing or shortness of breath. 1 Inhaler 1   albuterol (PROVENTIL) (2.5 MG/3ML) 0.083% nebulizer  solution Take 3 mLs (2.5 mg total) by nebulization every 6 (six) hours as needed for wheezing or shortness of breath. 75 mL 12   ALPRAZolam (XANAX) 0.5 MG tablet Take 0.5 mg by mouth at bedtime as needed for anxiety.     aspirin EC 81 MG tablet Take 81 mg by mouth daily.     atorvastatin (LIPITOR) 40 MG tablet Take 20 mg by mouth daily.     azaTHIOprine (IMURAN) 50 MG tablet Take 1 tablet (50 mg total) by mouth daily. 30 tablet 5   glipiZIDE (GLUCOTROL XL) 10 MG 24 hr tablet Take 1 tablet by mouth 2 (two) times a day.     JARDIANCE 25 MG TABS tablet Take 25 mg by mouth daily.     losartan (COZAAR) 50 MG tablet Take 50 mg by mouth daily.     metFORMIN (GLUCOPHAGE-XR) 500 MG 24 hr tablet Take 2 tablets (1,000 mg total) by mouth 2 (two) times a day.     Multiple Vitamins-Minerals (MULTIVITAMINS THER. W/MINERALS) TABS tablet Take 1 tablet by mouth daily.     pantoprazole (PROTONIX) 40 MG tablet Take 1 tablet (40 mg total) by mouth 2 (two) times daily before a meal. (Patient taking differently: Take 40 mg by mouth at bedtime.) 60 tablet 0   propranolol ER (INDERAL LA) 60 MG 24 hr capsule Take 60 mg by mouth daily.     TOUJEO SOLOSTAR 300 UNIT/ML Solostar Pen Inject 10 Units into the skin daily.     No current facility-administered medications for this visit.    Allergies as of  06/12/2021 - Review Complete 06/12/2021  Allergen Reaction Noted   Lisinopril Cough 10/09/2020    ROS:  General: Negative for anorexia, weight loss, fever, chills, fatigue, weakness. ENT: Negative for hoarseness, difficulty swallowing , nasal congestion. CV: Negative for chest pain, angina, palpitations, dyspnea on exertion, peripheral edema.  Respiratory: Negative for dyspnea at rest, dyspnea on exertion, cough, sputum, wheezing.  GI: See history of present illness. GU:  Negative for dysuria, hematuria, urinary incontinence, urinary frequency, nocturnal urination.  Endo: Negative for unusual weight change.     Physical Examination:   BP (!) 154/81   Pulse 78   Temp (!) 97.3 F (36.3 C) (Temporal)   Ht 6' (1.829 m)   Wt 236 lb 9.6 oz (107.3 kg)   BMI 32.09 kg/m   General: Well-nourished, well-developed in no acute distress.  Eyes: No icterus. Mouth: masked Lungs: Clear to auscultation bilaterally.  Heart: Regular rate and rhythm, no murmurs rubs or gallops.  Abdomen: Bowel sounds are normal, nontender, nondistended, no hepatosplenomegaly or masses, no abdominal bruits or hernia , no rebound or guarding.   Extremities: No lower extremity edema. No clubbing or deformities. Neuro: Alert and oriented x 4   Skin: Warm and dry, no jaundice.   Psych: Alert and cooperative, normal mood and affect.  Labs:  Lab Results  Component Value Date   ALT 24 05/30/2021   AST 22 05/30/2021   ALKPHOS 75 01/13/2019   BILITOT 0.9 05/30/2021   Lab Results  Component Value Date   WBC 8.1 05/02/2021   HGB 13.2 05/02/2021   HCT 40.1 05/02/2021   MCV 95.2 05/02/2021   PLT 266 05/02/2021    Imaging Studies: No results found.   Assessment:  Autoimmune hepatitis: LFTs normalized.  Now off prednisone, last dose yesterday.  Tolerating Imuran 50 mg daily.  Plans for staying on Imuran 12 to 18 months with potentially weaning off at that time if he continues to do well.   Plan: Due for updated labs on October 26.  Will check CBC, CMET.   Continue Imuran 50 mg daily. Tentatively plan to see him back in 6 months but may need to be seen sooner if his labs are not stable.

## 2021-06-12 NOTE — Patient Instructions (Addendum)
Please go to lab on 06/27/21. We will contact you with results as available.  Continue Imuran (azathioprine) 50mg  daily.

## 2021-06-27 DIAGNOSIS — K754 Autoimmune hepatitis: Secondary | ICD-10-CM | POA: Diagnosis not present

## 2021-06-28 LAB — CBC WITH DIFFERENTIAL/PLATELET
Absolute Monocytes: 733 cells/uL (ref 200–950)
Basophils Absolute: 62 cells/uL (ref 0–200)
Basophils Relative: 0.8 %
Eosinophils Absolute: 187 cells/uL (ref 15–500)
Eosinophils Relative: 2.4 %
HCT: 40.4 % (ref 38.5–50.0)
Hemoglobin: 13.9 g/dL (ref 13.2–17.1)
Lymphs Abs: 1817 cells/uL (ref 850–3900)
MCH: 32 pg (ref 27.0–33.0)
MCHC: 34.4 g/dL (ref 32.0–36.0)
MCV: 93.1 fL (ref 80.0–100.0)
MPV: 10.2 fL (ref 7.5–12.5)
Monocytes Relative: 9.4 %
Neutro Abs: 5000 cells/uL (ref 1500–7800)
Neutrophils Relative %: 64.1 %
Platelets: 323 10*3/uL (ref 140–400)
RBC: 4.34 10*6/uL (ref 4.20–5.80)
RDW: 14.4 % (ref 11.0–15.0)
Total Lymphocyte: 23.3 %
WBC: 7.8 10*3/uL (ref 3.8–10.8)

## 2021-06-28 LAB — COMPREHENSIVE METABOLIC PANEL
AG Ratio: 1.9 (calc) (ref 1.0–2.5)
ALT: 22 U/L (ref 9–46)
AST: 24 U/L (ref 10–35)
Albumin: 4.5 g/dL (ref 3.6–5.1)
Alkaline phosphatase (APISO): 56 U/L (ref 35–144)
BUN: 13 mg/dL (ref 7–25)
CO2: 26 mmol/L (ref 20–32)
Calcium: 8.9 mg/dL (ref 8.6–10.3)
Chloride: 102 mmol/L (ref 98–110)
Creat: 0.82 mg/dL (ref 0.70–1.28)
Globulin: 2.4 g/dL (calc) (ref 1.9–3.7)
Glucose, Bld: 124 mg/dL — ABNORMAL HIGH (ref 65–99)
Potassium: 3.9 mmol/L (ref 3.5–5.3)
Sodium: 140 mmol/L (ref 135–146)
Total Bilirubin: 0.9 mg/dL (ref 0.2–1.2)
Total Protein: 6.9 g/dL (ref 6.1–8.1)

## 2021-07-02 DIAGNOSIS — K219 Gastro-esophageal reflux disease without esophagitis: Secondary | ICD-10-CM | POA: Diagnosis not present

## 2021-07-02 DIAGNOSIS — E1165 Type 2 diabetes mellitus with hyperglycemia: Secondary | ICD-10-CM | POA: Diagnosis not present

## 2021-07-03 ENCOUNTER — Other Ambulatory Visit: Payer: Self-pay | Admitting: Gastroenterology

## 2021-07-03 DIAGNOSIS — K754 Autoimmune hepatitis: Secondary | ICD-10-CM

## 2021-07-03 DIAGNOSIS — R7989 Other specified abnormal findings of blood chemistry: Secondary | ICD-10-CM

## 2021-07-31 DIAGNOSIS — R7989 Other specified abnormal findings of blood chemistry: Secondary | ICD-10-CM | POA: Diagnosis not present

## 2021-07-31 DIAGNOSIS — K754 Autoimmune hepatitis: Secondary | ICD-10-CM | POA: Diagnosis not present

## 2021-07-31 LAB — HEPATIC FUNCTION PANEL
AG Ratio: 1.7 (calc) (ref 1.0–2.5)
ALT: 23 U/L (ref 9–46)
AST: 24 U/L (ref 10–35)
Albumin: 4.4 g/dL (ref 3.6–5.1)
Alkaline phosphatase (APISO): 67 U/L (ref 35–144)
Bilirubin, Direct: 0.3 mg/dL — ABNORMAL HIGH (ref 0.0–0.2)
Globulin: 2.6 g/dL (calc) (ref 1.9–3.7)
Indirect Bilirubin: 0.7 mg/dL (calc) (ref 0.2–1.2)
Total Bilirubin: 1 mg/dL (ref 0.2–1.2)
Total Protein: 7 g/dL (ref 6.1–8.1)

## 2021-08-01 DIAGNOSIS — K219 Gastro-esophageal reflux disease without esophagitis: Secondary | ICD-10-CM | POA: Diagnosis not present

## 2021-08-01 DIAGNOSIS — E1165 Type 2 diabetes mellitus with hyperglycemia: Secondary | ICD-10-CM | POA: Diagnosis not present

## 2021-08-03 IMAGING — US US ABDOMEN LIMITED
1 series · 14 of 25 positions shown · non-contrast
Comparison: CT abdomen/pelvis dated 01/13/2019

CLINICAL DATA: Abnormal LFTs

EXAM:
ULTRASOUND ABDOMEN LIMITED RIGHT UPPER QUADRANT

[Series 1: us abdomen limited ruq (liver/gb) · 14 of 34 slices shown]
[im 1/34]
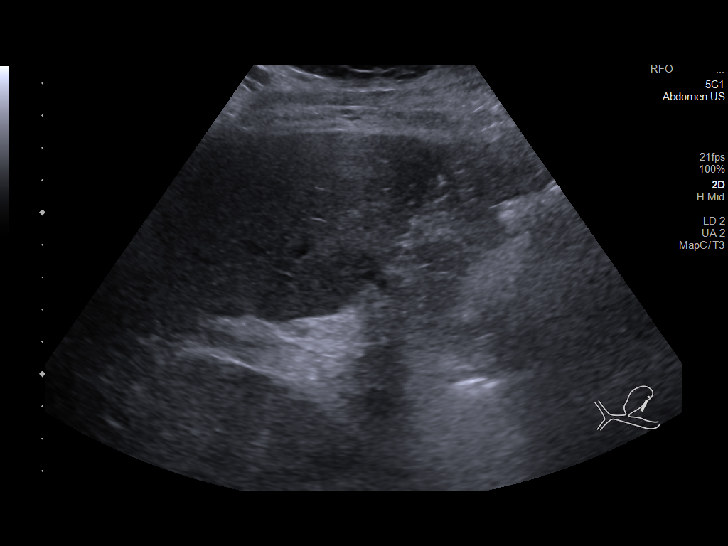
[im 3/34]
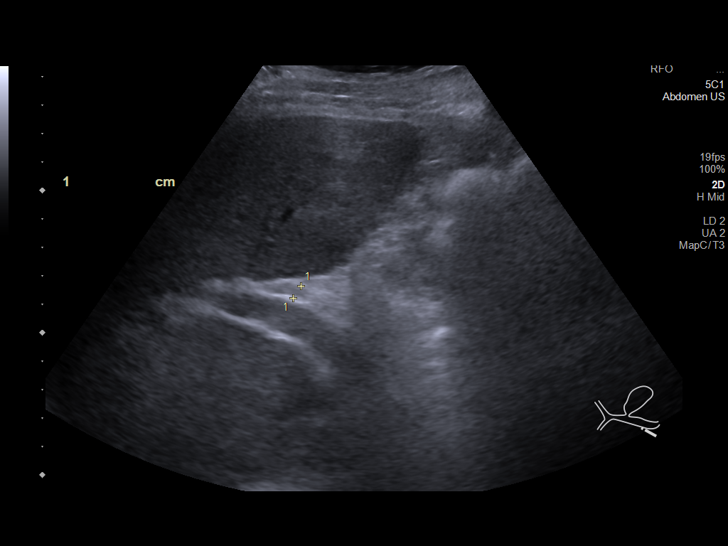
[im 6/34]
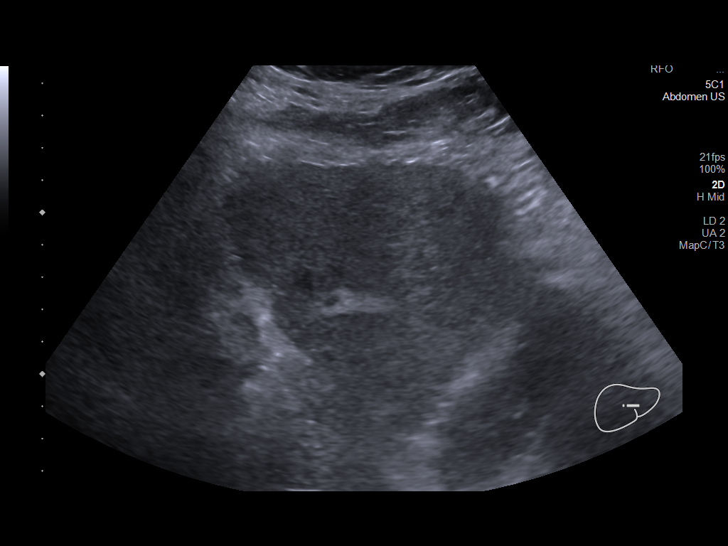
[im 9/34]
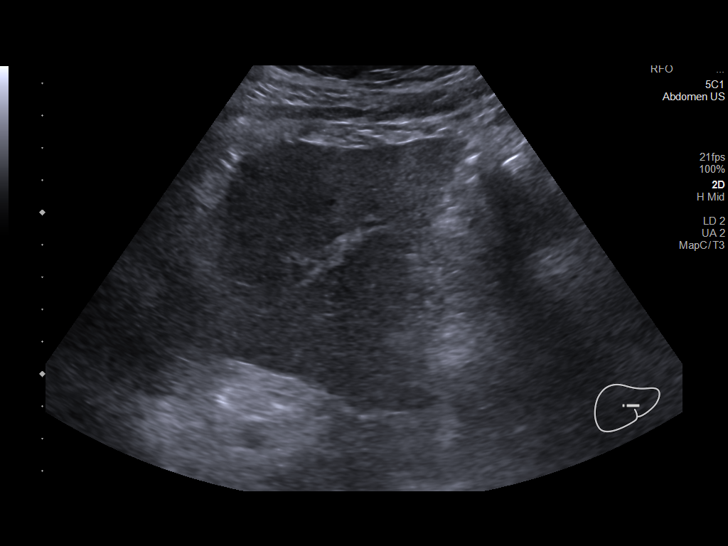
[im 12/34]
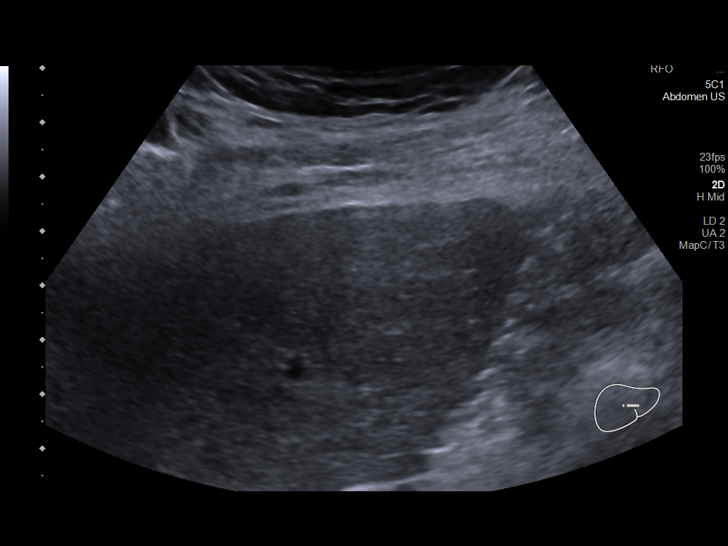
[im 13/34]
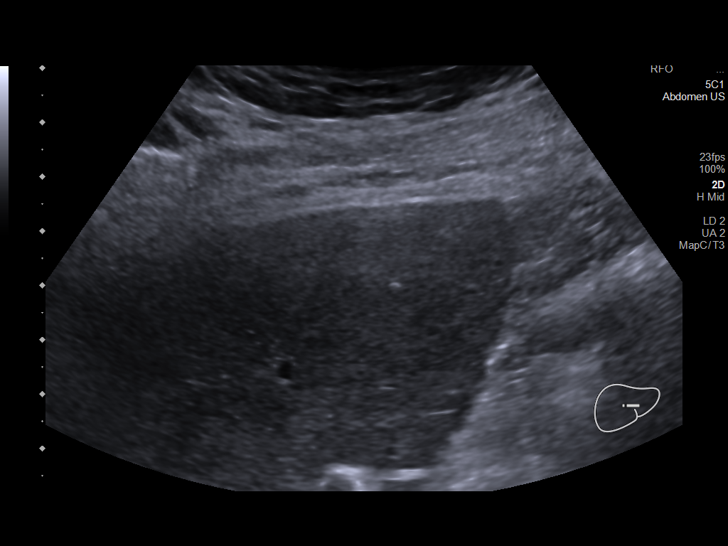
[im 16/34]
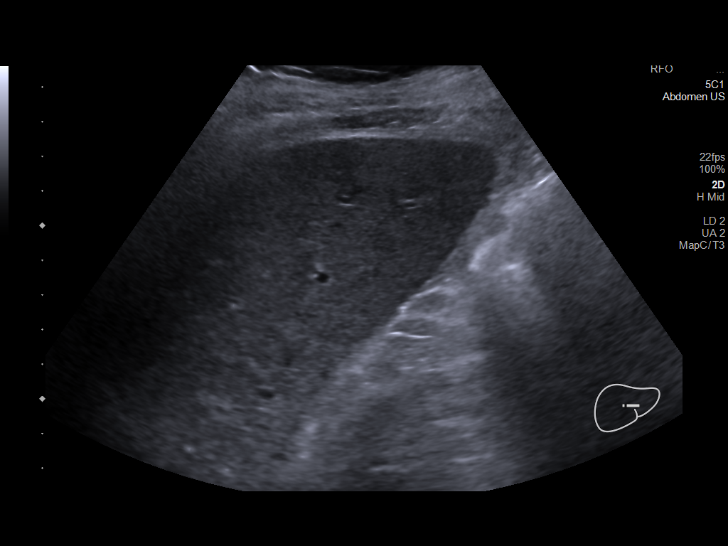
[im 18/34]
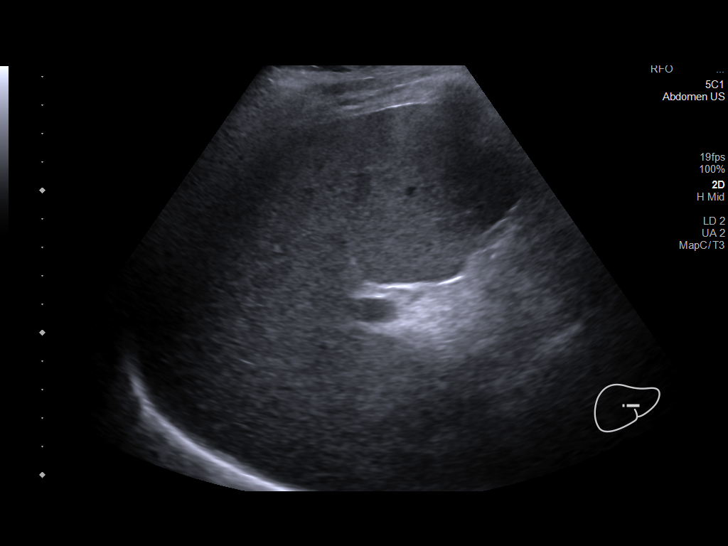
[im 21/34]
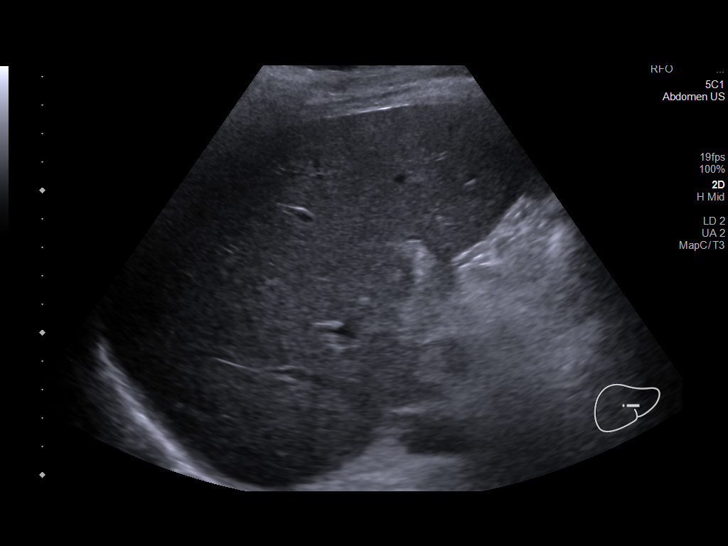
[im 23/34]
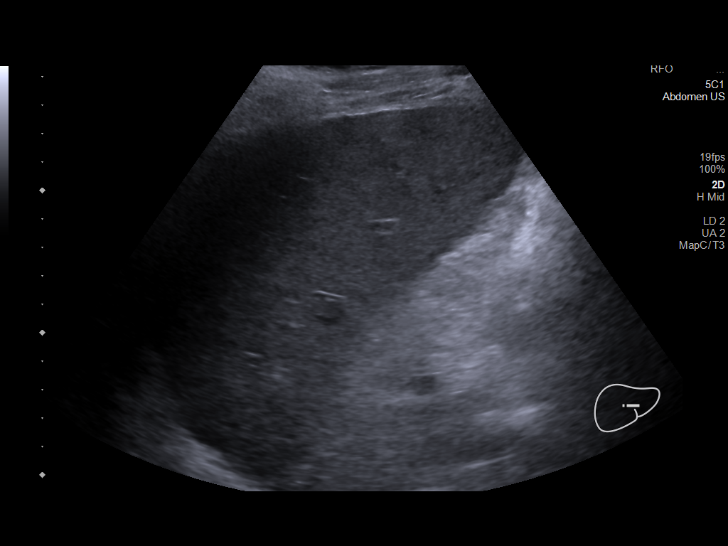
[im 25/34]
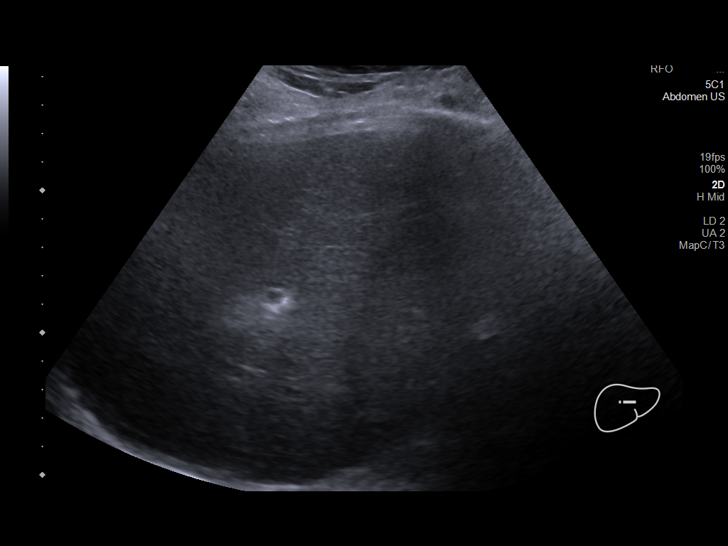
[im 28/34]
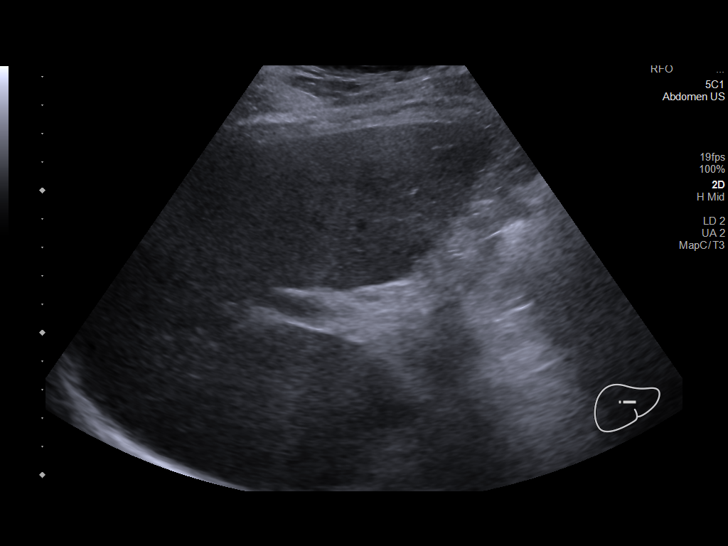
[im 31/34]
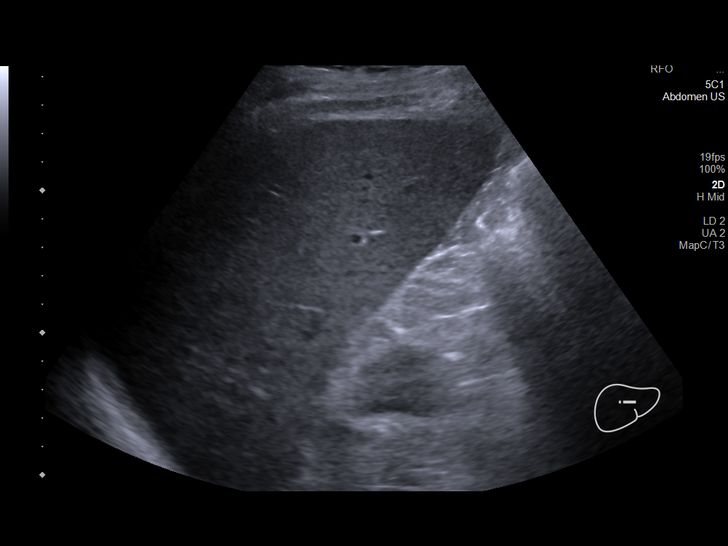
[im 34/34]
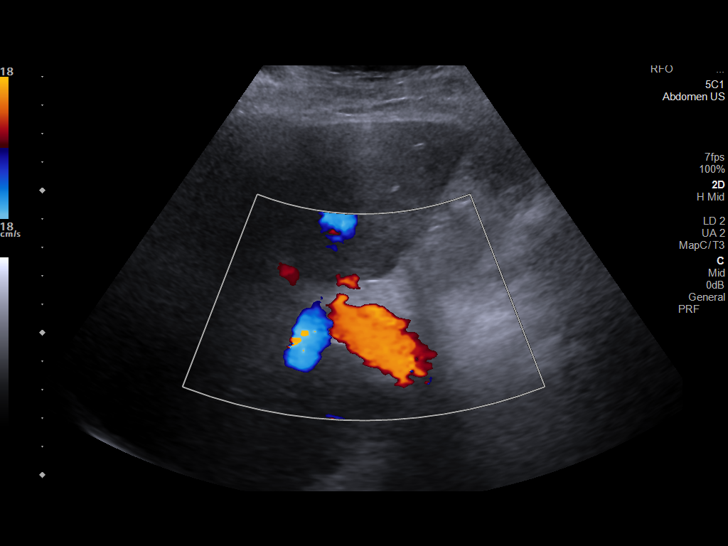

[14 of 25 positions shown; findings below may reference images not displayed]

FINDINGS: Gallbladder:

Surgically absent.

Common bile duct:

Diameter: 5 mm

Liver:

Coarse hepatic echotexture with mildly nodular hepatic contour. No
focal hepatic lesion is seen. Portal vein is patent on color Doppler
imaging with normal direction of blood flow towards the liver.

Other: None.
IMPRESSION: Coarse hepatic echotexture with mildly nodular hepatic contour,
raising the possibility of early/mild cirrhosis. No focal hepatic
lesion is seen.

Status post cholecystectomy.

## 2021-08-06 DIAGNOSIS — E782 Mixed hyperlipidemia: Secondary | ICD-10-CM | POA: Diagnosis not present

## 2021-08-06 DIAGNOSIS — E119 Type 2 diabetes mellitus without complications: Secondary | ICD-10-CM | POA: Diagnosis not present

## 2021-08-08 ENCOUNTER — Other Ambulatory Visit: Payer: Self-pay

## 2021-08-08 DIAGNOSIS — R7989 Other specified abnormal findings of blood chemistry: Secondary | ICD-10-CM

## 2021-08-09 DIAGNOSIS — E669 Obesity, unspecified: Secondary | ICD-10-CM | POA: Diagnosis not present

## 2021-08-09 DIAGNOSIS — I10 Essential (primary) hypertension: Secondary | ICD-10-CM | POA: Diagnosis not present

## 2021-08-09 DIAGNOSIS — F419 Anxiety disorder, unspecified: Secondary | ICD-10-CM | POA: Diagnosis not present

## 2021-08-09 DIAGNOSIS — K219 Gastro-esophageal reflux disease without esophagitis: Secondary | ICD-10-CM | POA: Diagnosis not present

## 2021-08-09 DIAGNOSIS — E119 Type 2 diabetes mellitus without complications: Secondary | ICD-10-CM | POA: Diagnosis not present

## 2021-08-09 DIAGNOSIS — Z23 Encounter for immunization: Secondary | ICD-10-CM | POA: Diagnosis not present

## 2021-08-09 DIAGNOSIS — R251 Tremor, unspecified: Secondary | ICD-10-CM | POA: Diagnosis not present

## 2021-08-09 DIAGNOSIS — E782 Mixed hyperlipidemia: Secondary | ICD-10-CM | POA: Diagnosis not present

## 2021-08-09 DIAGNOSIS — R600 Localized edema: Secondary | ICD-10-CM | POA: Diagnosis not present

## 2021-08-20 ENCOUNTER — Other Ambulatory Visit: Payer: Self-pay | Admitting: Internal Medicine

## 2021-08-23 ENCOUNTER — Emergency Department (HOSPITAL_COMMUNITY): Payer: Medicare Other

## 2021-08-23 ENCOUNTER — Encounter (HOSPITAL_COMMUNITY): Payer: Self-pay | Admitting: *Deleted

## 2021-08-23 ENCOUNTER — Emergency Department (HOSPITAL_COMMUNITY)
Admission: EM | Admit: 2021-08-23 | Discharge: 2021-08-23 | Disposition: A | Discharge status: Home / Self Care | Payer: Medicare Other | Attending: Emergency Medicine | Admitting: Emergency Medicine

## 2021-08-23 DIAGNOSIS — R600 Localized edema: Secondary | ICD-10-CM

## 2021-08-23 DIAGNOSIS — R0602 Shortness of breath: Secondary | ICD-10-CM | POA: Insufficient documentation

## 2021-08-23 DIAGNOSIS — M7989 Other specified soft tissue disorders: Secondary | ICD-10-CM | POA: Insufficient documentation

## 2021-08-23 DIAGNOSIS — I10 Essential (primary) hypertension: Secondary | ICD-10-CM | POA: Insufficient documentation

## 2021-08-23 DIAGNOSIS — Z7982 Long term (current) use of aspirin: Secondary | ICD-10-CM | POA: Insufficient documentation

## 2021-08-23 DIAGNOSIS — E119 Type 2 diabetes mellitus without complications: Secondary | ICD-10-CM | POA: Diagnosis not present

## 2021-08-23 DIAGNOSIS — Z87891 Personal history of nicotine dependence: Secondary | ICD-10-CM | POA: Insufficient documentation

## 2021-08-23 DIAGNOSIS — Z7984 Long term (current) use of oral hypoglycemic drugs: Secondary | ICD-10-CM | POA: Diagnosis not present

## 2021-08-23 LAB — CBC WITH DIFFERENTIAL/PLATELET
Abs Immature Granulocytes: 0.04 10*3/uL (ref 0.00–0.07)
Basophils Absolute: 0.1 10*3/uL (ref 0.0–0.1)
Basophils Relative: 1 %
Eosinophils Absolute: 0.2 10*3/uL (ref 0.0–0.5)
Eosinophils Relative: 2 %
HCT: 44 % (ref 39.0–52.0)
Hemoglobin: 14.6 g/dL (ref 13.0–17.0)
Immature Granulocytes: 0 %
Lymphocytes Relative: 18 %
Lymphs Abs: 1.7 10*3/uL (ref 0.7–4.0)
MCH: 30.6 pg (ref 26.0–34.0)
MCHC: 33.2 g/dL (ref 30.0–36.0)
MCV: 92.2 fL (ref 80.0–100.0)
Monocytes Absolute: 0.7 10*3/uL (ref 0.1–1.0)
Monocytes Relative: 7 %
Neutro Abs: 6.8 10*3/uL (ref 1.7–7.7)
Neutrophils Relative %: 72 %
Platelets: 350 10*3/uL (ref 150–400)
RBC: 4.77 MIL/uL (ref 4.22–5.81)
RDW: 15.1 % (ref 11.5–15.5)
WBC: 9.5 10*3/uL (ref 4.0–10.5)
nRBC: 0 % (ref 0.0–0.2)

## 2021-08-23 LAB — COMPREHENSIVE METABOLIC PANEL
ALT: 28 U/L (ref 0–44)
AST: 26 U/L (ref 15–41)
Albumin: 4.5 g/dL (ref 3.5–5.0)
Alkaline Phosphatase: 70 U/L (ref 38–126)
Anion gap: 16 — ABNORMAL HIGH (ref 5–15)
BUN: 19 mg/dL (ref 8–23)
CO2: 23 mmol/L (ref 22–32)
Calcium: 9 mg/dL (ref 8.9–10.3)
Chloride: 100 mmol/L (ref 98–111)
Creatinine, Ser: 0.82 mg/dL (ref 0.61–1.24)
GFR, Estimated: >60 mL/min (ref >60)
Glucose, Bld: 163 mg/dL — ABNORMAL HIGH (ref 70–99)
Potassium: 3.7 mmol/L (ref 3.5–5.1)
Sodium: 139 mmol/L (ref 135–145)
Total Bilirubin: 0.6 mg/dL (ref 0.3–1.2)
Total Protein: 7.9 g/dL (ref 6.5–8.1)

## 2021-08-23 LAB — TROPONIN I (HIGH SENSITIVITY)
Troponin I (High Sensitivity): 4 ng/L (ref <18)
Troponin I (High Sensitivity): 3 ng/L (ref <18)

## 2021-08-23 LAB — BRAIN NATRIURETIC PEPTIDE: B Natriuretic Peptide: 13 pg/mL (ref 0.0–100.0)

## 2021-08-23 NOTE — ED Triage Notes (Signed)
C/o shortness of breath onset today

## 2021-08-23 NOTE — ED Provider Notes (Signed)
[facility] Provider Triage Evaluation Note  Douglas Mason , a 70 y.o. male  was evaluated in triage.  Pt complains of shortness of breath that began earlier today.  Has been complaining of bilateral feet swelling over the last 2 weeks.  No chest pain, fever, chills, cough, congestion.  Review of Systems  Positive:  Negative: See above   Physical Exam  BP 137/85 (BP Location: Right Arm)    Pulse 80    Temp 97.6 F (36.4 C) (Oral)    Resp 20    SpO2 94%  Gen:   Awake, no distress   Resp:  Normal effort  MSK:   Moves extremities without difficulty  Other:  2+ pitting edema on the dorsal aspect of the feet bilaterally.  Medical Decision Making  Medically screening exam initiated at 5:09 PM.  Appropriate orders placed.  Douglas Mason was informed that the remainder of the evaluation will be completed by another provider, this initial triage assessment does not replace that evaluation, and the importance of remaining in the ED until their evaluation is complete.     Hendricks Limes, [place] 08/23/21 1710    Luna Fuse, MD 08/23/21 2007

## 2021-08-23 NOTE — Discharge Instructions (Addendum)
Call your primary care doctor or specialist as discussed in the next 2-3 days.   Return immediately back to the ER if:  Your symptoms worsen within the next 12-24 hours. You develop new symptoms such as new fevers, persistent vomiting, new pain, shortness of breath, or new weakness or numbness, or if you have any other concerns.  

## 2021-08-23 NOTE — ED Provider Notes (Signed)
[facility] Provider Note   CSN: [id number] Arrival date & time: 08/23/21  1610     History Chief Complaint  Patient presents with   Shortness of Breath    Douglas Mason is a 70 y.o. male.  Patient presents chief complaint of shortness of breath today.  Otherwise denies any cough.  No fevers no vomiting or diarrhea.  Denies any chest pain.  Has noticed bilateral feet swelling for the past 2 weeks.  States he discussed with his primary care doctor who has been continue to watch it.      Past Medical History:  Diagnosis Date   Bronchitis    Diabetes mellitus without complication (HCC)    GERD (gastroesophageal reflux disease)    High cholesterol    Hypertension    Pleurisy    Pneumonia    Sleep apnea    nurse in pre-op observed pt sats drop to 80-82% while sleeping on RA,wake up would go to 87%    Patient Active Problem List   Diagnosis Date Noted   Autoimmune hepatitis ([place]) 02/15/2021   Acute cholecystitis 01/13/2019   Anxiety 01/13/2019   Calculus of gallbladder with acute cholecystitis without obstruction    Dyspnea 08/04/2014   OSA (obstructive sleep apnea) 05/02/2014   Upper airway cough syndrome 03/16/2014   CAP (community acquired pneumonia) 03/15/2014   Atrial flutter ([place]) 03/15/2014   Pericardial effusion by CT, not seen by echo 03/15/2014   Chest pain with moderate risk of acute coronary syndrome 03/13/2014   Essential hypertension 03/13/2014   Diabetes mellitus type II, non insulin dependent ([place]) 03/13/2014   GERD (gastroesophageal reflux disease) 03/13/2014    Past Surgical History:  Procedure Laterality Date   CHOLECYSTECTOMY N/A 01/13/2019   Procedure: LAPAROSCOPIC CHOLECYSTECTOMY;  Surgeon: Virl Cagey, MD;  Location: [facility];  Service: General;  Laterality: N/A;       Family History  Problem Relation Age of Onset   Asthma Mother    Leukemia Mother    Rheum arthritis Mother    Rheum arthritis Maternal  Grandmother     Social History   Tobacco Use   Smoking status: Former    Packs/day: 2.00    Years: 30.00    Pack years: 60.00    Types: Cigarettes    Quit date: 09/02/1976    Years since quitting: 45.0   Smokeless tobacco: Former    Types: Chew    Quit date: 09/03/2003  Vaping Use   Vaping Use: Never used  Substance Use Topics   Alcohol use: No   Drug use: No    Home Medications Prior to Admission medications   Medication Sig Start Date End Date Taking? Authorizing Provider  albuterol (PROVENTIL HFA;VENTOLIN HFA) 108 (90 BASE) MCG/ACT inhaler Inhale 2 puffs into the lungs every 4 (four) hours as needed for wheezing or shortness of breath. 06/28/14   Virgel Manifold, MD  albuterol (PROVENTIL) (2.5 MG/3ML) 0.083% nebulizer solution Take 3 mLs (2.5 mg total) by nebulization every 6 (six) hours as needed for wheezing or shortness of breath. 08/18/14   Parrett, Fonnie Mu, NP  ALPRAZolam Duanne Moron) 0.5 MG tablet Take 0.5 mg by mouth at bedtime as needed for anxiety. 01/12/14   [provider]  aspirin EC 81 MG tablet Take 81 mg by mouth daily.    [provider]  atorvastatin (LIPITOR) 40 MG tablet Take 20 mg by mouth daily.    [provider]  azaTHIOprine (IMURAN) 50 MG tablet  TAKE ONE TABLET BY MOUTH ONCE DAILY 08/21/21   Annitta Needs, NP  glipiZIDE (GLUCOTROL XL) 10 MG 24 hr tablet Take 1 tablet by mouth 2 (two) times a day. 12/04/18   [provider]  JARDIANCE 25 MG TABS tablet Take 25 mg by mouth daily. 05/17/21   [provider]  losartan (COZAAR) 50 MG tablet Take 50 mg by mouth daily.    [provider]  metFORMIN (GLUCOPHAGE-XR) 500 MG 24 hr tablet Take 2 tablets (1,000 mg total) by mouth 2 (two) times a day. 01/16/19   Virl Cagey, MD  Multiple Vitamins-Minerals (MULTIVITAMINS THER. W/MINERALS) TABS tablet Take 1 tablet by mouth daily.    [provider]  pantoprazole (PROTONIX) 40 MG tablet Take 1 tablet (40 mg total)  by mouth 2 (two) times daily before a meal. Patient taking differently: Take 40 mg by mouth at bedtime. 03/17/14   Kelby Aline, MD  propranolol ER (INDERAL LA) 60 MG 24 hr capsule Take 60 mg by mouth daily. 09/20/20   [provider]  TOUJEO SOLOSTAR 300 UNIT/ML Solostar Pen Inject 10 Units into the skin daily. 05/17/21   [provider]    Allergies    Lisinopril  Review of Systems   Review of Systems  Constitutional:  Negative for fever.  HENT:  Negative for ear pain and sore throat.   Eyes:  Negative for pain.  Respiratory:  Positive for shortness of breath. Negative for cough.   Cardiovascular:  Negative for chest pain.  Gastrointestinal:  Negative for abdominal pain.  Genitourinary:  Negative for flank pain.  Musculoskeletal:  Negative for back pain.  Skin:  Negative for color change and rash.  Neurological:  Negative for syncope.  All other systems reviewed and are negative.  Physical Exam Updated Vital Signs BP (!) 136/97    Pulse 77    Temp 97.6 F (36.4 C) (Oral)    Resp 15    SpO2 95%   Physical Exam Constitutional:      Appearance: He is well-developed.  HENT:     Head: Normocephalic.     Nose: Nose normal.  Eyes:     Extraocular Movements: Extraocular movements intact.  Cardiovascular:     Rate and Rhythm: Normal rate.  Pulmonary:     Effort: Pulmonary effort is normal.  Musculoskeletal:     Comments: Patient has mild swelling bilateral feet.  No calf swelling or tenderness noted.  No pretibial pitting edema noted.  No unilateral swelling abnormality noted.  Skin:    Coloration: Skin is not jaundiced.  Neurological:     Mental Status: He is alert. Mental status is at baseline.    ED Results / Procedures / Treatments   Labs (all labs ordered are listed, but only abnormal results are displayed) Labs Reviewed  COMPREHENSIVE METABOLIC PANEL - Abnormal; Notable for the following components:      Result Value   Glucose, Bld 163 (*)     Anion gap 16 (*)    All other components within normal limits  BRAIN NATRIURETIC PEPTIDE  CBC WITH DIFFERENTIAL/PLATELET  TROPONIN I (HIGH SENSITIVITY)  TROPONIN I (HIGH SENSITIVITY)    EKG EKG Interpretation  Date/Time:  Thursday August 23 2021 16:44:32 EST Ventricular Rate:  79 PR Interval:  198 QRS Duration: 102 QT Interval:  374 QTC Calculation: 428 R Axis:   -11 Text Interpretation: Normal sinus rhythm Minimal voltage criteria for LVH, may be normal variant ( R in aVL )  Cannot rule out Anterior infarct , age undetermined Abnormal ECG Confirmed by Thamas Jaegers ([id number]) on 08/23/2021 5:15:25 PM  Radiology DG Chest 2 View  Result Date: 08/23/2021 CLINICAL DATA:  Shortness of breath. EXAM: CHEST - 2 VIEW COMPARISON:  Chest x-ray 04/28/2018. FINDINGS: The heart size and mediastinal contours are within normal limits. Both lungs are clear. The visualized skeletal structures are unremarkable. IMPRESSION: No active cardiopulmonary disease. Electronically Signed   By: Ronney Asters M.D.   On: 08/23/2021 17:33    Procedures Procedures   Medications Ordered in ED Medications - No data to display  ED Course  I have reviewed the triage vital signs and the nursing notes.  Pertinent labs & imaging results that were available during my care of the patient were reviewed by me and considered in my medical decision making (see chart for details).    MDM Rules/Calculators/A&P                         Patient presents with normal vital signs.  Chest x-ray is unremarkable, labs are unremarkable troponin sent x2 and negative.  proBNP normal as well.  White count is normal as well.  Etiology of his symptoms are unclear.  Pulmonary embolism considered, however he has no unilateral leg swelling, no tachycardia no chest pain.  Will advise close outpatient follow-up with his primary care doctor in 2 to 3 days, recommend immediate return for worsening symptoms or any additional  concerns.     Final Clinical Impression(s) / ED Diagnoses Final diagnoses:  Shortness of breath    Rx / DC Orders ED Discharge Orders          Ordered    US Venous Img Lower Bilateral        08/23/21 2011             Luna Fuse, MD 08/23/21 2011

## 2021-08-30 ENCOUNTER — Ambulatory Visit (HOSPITAL_COMMUNITY)
Admission: RE | Admit: 2021-08-30 | Discharge: 2021-08-30 | Disposition: A | Discharge status: Home / Self Care | Payer: Medicare Other | Source: Ambulatory Visit | Attending: Emergency Medicine | Admitting: Emergency Medicine

## 2021-08-30 ENCOUNTER — Other Ambulatory Visit: Payer: Self-pay

## 2021-08-30 DIAGNOSIS — R0602 Shortness of breath: Secondary | ICD-10-CM | POA: Diagnosis not present

## 2021-08-30 DIAGNOSIS — R600 Localized edema: Secondary | ICD-10-CM | POA: Insufficient documentation

## 2021-08-31 DIAGNOSIS — E1165 Type 2 diabetes mellitus with hyperglycemia: Secondary | ICD-10-CM | POA: Diagnosis not present

## 2021-08-31 DIAGNOSIS — K219 Gastro-esophageal reflux disease without esophagitis: Secondary | ICD-10-CM | POA: Diagnosis not present

## 2021-09-04 ENCOUNTER — Other Ambulatory Visit (HOSPITAL_COMMUNITY): Payer: Self-pay | Admitting: Internal Medicine

## 2021-09-04 ENCOUNTER — Other Ambulatory Visit: Payer: Self-pay | Admitting: Internal Medicine

## 2021-09-04 DIAGNOSIS — R600 Localized edema: Secondary | ICD-10-CM

## 2021-09-06 ENCOUNTER — Other Ambulatory Visit: Payer: Self-pay | Admitting: *Deleted

## 2021-09-06 ENCOUNTER — Encounter: Payer: Self-pay | Admitting: *Deleted

## 2021-09-06 DIAGNOSIS — R7989 Other specified abnormal findings of blood chemistry: Secondary | ICD-10-CM

## 2021-09-10 ENCOUNTER — Emergency Department (HOSPITAL_COMMUNITY): Payer: Medicare Other

## 2021-09-10 ENCOUNTER — Emergency Department (HOSPITAL_COMMUNITY)
Admission: EM | Admit: 2021-09-10 | Discharge: 2021-09-11 | Disposition: A | Discharge status: Home / Self Care | Payer: Medicare Other | Attending: Emergency Medicine | Admitting: Emergency Medicine

## 2021-09-10 ENCOUNTER — Other Ambulatory Visit: Payer: Self-pay

## 2021-09-10 DIAGNOSIS — Z79899 Other long term (current) drug therapy: Secondary | ICD-10-CM | POA: Diagnosis not present

## 2021-09-10 DIAGNOSIS — R600 Localized edema: Secondary | ICD-10-CM | POA: Diagnosis not present

## 2021-09-10 DIAGNOSIS — Z7984 Long term (current) use of oral hypoglycemic drugs: Secondary | ICD-10-CM | POA: Insufficient documentation

## 2021-09-10 DIAGNOSIS — J9811 Atelectasis: Secondary | ICD-10-CM | POA: Diagnosis not present

## 2021-09-10 DIAGNOSIS — Z7982 Long term (current) use of aspirin: Secondary | ICD-10-CM | POA: Diagnosis not present

## 2021-09-10 DIAGNOSIS — R0602 Shortness of breath: Secondary | ICD-10-CM | POA: Diagnosis not present

## 2021-09-10 DIAGNOSIS — Z20822 Contact with and (suspected) exposure to covid-19: Secondary | ICD-10-CM | POA: Insufficient documentation

## 2021-09-10 LAB — CBC WITH DIFFERENTIAL/PLATELET
Abs Immature Granulocytes: 0.05 10*3/uL (ref 0.00–0.07)
Basophils Absolute: 0.1 10*3/uL (ref 0.0–0.1)
Basophils Relative: 1 %
Eosinophils Absolute: 0.2 10*3/uL (ref 0.0–0.5)
Eosinophils Relative: 2 %
HCT: 44.7 % (ref 39.0–52.0)
Hemoglobin: 14.6 g/dL (ref 13.0–17.0)
Immature Granulocytes: 0 %
Lymphocytes Relative: 19 %
Lymphs Abs: 2.1 10*3/uL (ref 0.7–4.0)
MCH: 30.6 pg (ref 26.0–34.0)
MCHC: 32.7 g/dL (ref 30.0–36.0)
MCV: 93.7 fL (ref 80.0–100.0)
Monocytes Absolute: 0.9 10*3/uL (ref 0.1–1.0)
Monocytes Relative: 8 %
Neutro Abs: 8 10*3/uL — ABNORMAL HIGH (ref 1.7–7.7)
Neutrophils Relative %: 70 %
Platelets: 403 10*3/uL — ABNORMAL HIGH (ref 150–400)
RBC: 4.77 MIL/uL (ref 4.22–5.81)
RDW: 15.2 % (ref 11.5–15.5)
WBC: 11.4 10*3/uL — ABNORMAL HIGH (ref 4.0–10.5)
nRBC: 0 % (ref 0.0–0.2)

## 2021-09-10 LAB — COMPREHENSIVE METABOLIC PANEL
ALT: 26 U/L (ref 0–44)
AST: 29 U/L (ref 15–41)
Albumin: 4.4 g/dL (ref 3.5–5.0)
Alkaline Phosphatase: 70 U/L (ref 38–126)
Anion gap: 11 (ref 5–15)
BUN: 19 mg/dL (ref 8–23)
CO2: 26 mmol/L (ref 22–32)
Calcium: 9.2 mg/dL (ref 8.9–10.3)
Chloride: 100 mmol/L (ref 98–111)
Creatinine, Ser: 1.14 mg/dL (ref 0.61–1.24)
GFR, Estimated: >60 mL/min (ref >60)
Glucose, Bld: 158 mg/dL — ABNORMAL HIGH (ref 70–99)
Potassium: 4.1 mmol/L (ref 3.5–5.1)
Sodium: 137 mmol/L (ref 135–145)
Total Bilirubin: 0.7 mg/dL (ref 0.3–1.2)
Total Protein: 7.8 g/dL (ref 6.5–8.1)

## 2021-09-10 LAB — TROPONIN I (HIGH SENSITIVITY): Troponin I (High Sensitivity): 7 ng/L (ref <18)

## 2021-09-10 LAB — BRAIN NATRIURETIC PEPTIDE: B Natriuretic Peptide: 11.3 pg/mL (ref 0.0–100.0)

## 2021-09-10 LAB — D-DIMER, QUANTITATIVE: D-Dimer, Quant: 0.52 ug/mL-FEU — ABNORMAL HIGH (ref 0.00–0.50)

## 2021-09-10 NOTE — ED Triage Notes (Signed)
Pt c/o SOB with increased leg swelling x1 month. Denies CP/HX CHF. No resp distress in triage.

## 2021-09-10 NOTE — ED Provider Triage Note (Signed)
Emergency Medicine Provider Triage Evaluation Note  Douglas Mason , a 71 y.o. male  was evaluated in triage.  Pt complains of shortness of breath and leg swelling to bilateral lower extremities.  Reports that leg swelling has been present over the last month and is unchanged over this time.  Patient states that shortness of breath started last week and has been consistent since then.  Shortness of breath is worse when laying flat  Review of Systems  Positive: Shortness of breath breath, swelling to bilateral lower extremities, nonproductive cough Negative: Chest pain, syncope, hemoptysis, fever, chills  Physical Exam  BP (!) 151/100 (BP Location: Right Arm)    Pulse 94    Temp 97.9 F (36.6 C) (Oral)    Resp 16    SpO2 96%  Gen:   Awake, no distress   Resp:  Normal effort, lungs clear to auscultation bilaterally MSK:   Moves extremities without difficulty; minimal edema to bilateral lower extremities Other:    Medical Decision Making  Medically screening exam initiated at 7:02 PM.  Appropriate orders placed.  Alpheus Stiff Lundeen was informed that the remainder of the evaluation will be completed by another provider, this initial triage assessment does not replace that evaluation, and the importance of remaining in the ED until their evaluation is complete.     Douglas Mason, [place] 09/10/21 1905

## 2021-09-11 ENCOUNTER — Emergency Department (HOSPITAL_COMMUNITY): Payer: Medicare Other

## 2021-09-11 DIAGNOSIS — J9811 Atelectasis: Secondary | ICD-10-CM | POA: Diagnosis not present

## 2021-09-11 DIAGNOSIS — R0602 Shortness of breath: Secondary | ICD-10-CM | POA: Diagnosis not present

## 2021-09-11 LAB — RESP PANEL BY RT-PCR (FLU A&B, COVID) ARPGX2
Influenza A by PCR: NEGATIVE
Influenza B by PCR: NEGATIVE
SARS Coronavirus 2 by RT PCR: NEGATIVE

## 2021-09-11 LAB — TROPONIN I (HIGH SENSITIVITY): Troponin I (High Sensitivity): 6 ng/L (ref <18)

## 2021-09-11 MED ORDER — IOHEXOL 350 MG/ML SOLN
75.0000 mL | Freq: Once | INTRAVENOUS | Status: AC | PRN
Start: 2021-09-11 — End: 2021-09-11
  Administered 2021-09-11: 70 mL via INTRAVENOUS

## 2021-09-11 NOTE — ED Provider Notes (Signed)
[facility] EMERGENCY DEPARTMENT Provider Note   CSN: [id number] Arrival date & time: 09/10/21  1700     History  Chief Complaint  Patient presents with   Shortness of Breath    Douglas Mason is a 71 y.o. male.  HPI Patient presents with his wife who assists with the history.  He presents due to dyspnea, has resolved.  He notes that for a long time, possibly as long as a decade he has had episodes of dyspnea similar to what he experienced yesterday prompting ED evaluation.  He also complains of bilateral lower extremity swelling that has been going on for greater than 1 week.  No fever, no chest pain, currently again, dyspnea is resolved, but lower extremity swelling is persistent.  He has been seen, evaluated by his physician, and ED 1 week ago.  He is scheduled for outpatient testing tomorrow, had ultrasound performed last week.    Home Medications Prior to Admission medications   Medication Sig Start Date End Date Taking? Authorizing Provider  albuterol (PROVENTIL HFA;VENTOLIN HFA) 108 (90 BASE) MCG/ACT inhaler Inhale 2 puffs into the lungs every 4 (four) hours as needed for wheezing or shortness of breath. 06/28/14   Virgel Manifold, MD  albuterol (PROVENTIL) (2.5 MG/3ML) 0.083% nebulizer solution Take 3 mLs (2.5 mg total) by nebulization every 6 (six) hours as needed for wheezing or shortness of breath. 08/18/14   Parrett, Fonnie Mu, NP  ALPRAZolam Duanne Moron) 0.5 MG tablet Take 0.5 mg by mouth at bedtime as needed for anxiety. 01/12/14   [provider]  aspirin EC 81 MG tablet Take 81 mg by mouth daily.    [provider]  atorvastatin (LIPITOR) 40 MG tablet Take 20 mg by mouth daily.    [provider]  azaTHIOprine (IMURAN) 50 MG tablet TAKE ONE TABLET BY MOUTH ONCE DAILY 08/21/21   Annitta Needs, NP  furosemide (LASIX) 20 MG tablet Take 20 mg by mouth daily. 08/20/21   [provider]  furosemide (LASIX) 40 MG tablet Take 40 mg  by mouth daily. 09/05/21   [provider]  glipiZIDE (GLUCOTROL XL) 10 MG 24 hr tablet Take 1 tablet by mouth 2 (two) times a day. 12/04/18   [provider]  HYDROcodone-acetaminophen (NORCO/VICODIN) 5-325 MG tablet Take 1 tablet by mouth at bedtime as needed for pain. 09/05/21   [provider]  JARDIANCE 25 MG TABS tablet Take 25 mg by mouth daily. 05/17/21   [provider]  losartan (COZAAR) 50 MG tablet Take 50 mg by mouth daily.    [provider]  metFORMIN (GLUCOPHAGE-XR) 500 MG 24 hr tablet Take 2 tablets (1,000 mg total) by mouth 2 (two) times a day. 01/16/19   Virl Cagey, MD  Multiple Vitamins-Minerals (MULTIVITAMINS THER. W/MINERALS) TABS tablet Take 1 tablet by mouth daily.    [provider]  pantoprazole (PROTONIX) 40 MG tablet Take 1 tablet (40 mg total) by mouth 2 (two) times daily before a meal. Patient taking differently: Take 40 mg by mouth at bedtime. 03/17/14   Kelby Aline, MD  potassium chloride (KLOR-CON) 10 MEQ tablet Take 10 mEq by mouth daily. 08/20/21   [provider]  potassium chloride SA (KLOR-CON M) 20 MEQ tablet Take 20 mEq by mouth daily. 09/05/21   [provider]  propranolol ER (INDERAL LA) 60 MG 24 hr capsule Take 60 mg by mouth daily. 09/20/20   [provider]  Obie Dredge 300  UNIT/ML Solostar Pen Inject 10 Units into the skin daily. 05/17/21   [provider]  TRESIBA FLEXTOUCH 100 UNIT/ML FlexTouch Pen Inject 10 Units into the skin daily. 08/10/21   [provider]      Allergies    Lisinopril    Review of Systems   Review of Systems  Constitutional:        Per HPI, otherwise negative  HENT:         Per HPI, otherwise negative  Respiratory:         Per HPI, otherwise negative  Cardiovascular:        Per HPI, otherwise negative  Gastrointestinal:  Negative for vomiting.  Endocrine:       Negative aside from HPI  Genitourinary:        Neg  aside from HPI   Musculoskeletal:        Per HPI, otherwise negative  Skin: Negative.   Neurological:  Negative for syncope.   Physical Exam Updated Vital Signs BP 119/82    Pulse 88    Temp 98 F (36.7 C)    Resp 18    SpO2 98%  Physical Exam Vitals and nursing note reviewed.  Constitutional:      General: He is not in acute distress.    Appearance: He is well-developed.  HENT:     Head: Normocephalic and atraumatic.  Eyes:     Conjunctiva/sclera: Conjunctivae normal.  Cardiovascular:     Rate and Rhythm: Normal rate and regular rhythm.  Pulmonary:     Effort: Pulmonary effort is normal. No respiratory distress.     Breath sounds: No stridor.  Abdominal:     General: There is no distension.  Musculoskeletal:     Right lower leg: Edema present.     Left lower leg: Edema present.  Skin:    General: Skin is warm and dry.  Neurological:     Mental Status: He is alert and oriented to person, place, and time.    ED Results / Procedures / Treatments   Labs (all labs ordered are listed, but only abnormal results are displayed) Labs Reviewed  D-DIMER, QUANTITATIVE - Abnormal; Notable for the following components:      Result Value   D-Dimer, Quant 0.52 (*)    All other components within normal limits  COMPREHENSIVE METABOLIC PANEL - Abnormal; Notable for the following components:   Glucose, Bld 158 (*)    All other components within normal limits  CBC WITH DIFFERENTIAL/PLATELET - Abnormal; Notable for the following components:   WBC 11.4 (*)    Platelets 403 (*)    Neutro Abs 8.0 (*)    All other components within normal limits  RESP PANEL BY RT-PCR (FLU A&B, COVID) ARPGX2  BRAIN NATRIURETIC PEPTIDE  TROPONIN I (HIGH SENSITIVITY)  TROPONIN I (HIGH SENSITIVITY)    EKG EKG Interpretation  Date/Time:  Monday September 10 2021 18:58:02 EST Ventricular Rate:  98 PR Interval:  178 QRS Duration: 84 QT Interval:  358 QTC Calculation: 457 R Axis:   -5 Text  Interpretation: Normal sinus rhythm Possible Anterior infarct , age undetermined Abnormal ECG When compared with ECG of 23-Aug-2021 16:44, PREVIOUS ECG IS PRESENT Confirmed by Carmin Muskrat [phone]) on 09/11/2021 8:19:18 AM  Radiology DG Chest 2 View  Result Date: 09/10/2021 CLINICAL DATA:  Shortness of breath. EXAM: CHEST - 2 VIEW COMPARISON:  Chest radiograph dated 08/23/2021. FINDINGS: Mild eventration of the right hemidiaphragm. No focal consolidation, pleural effusion, pneumothorax. Stable  cardiac silhouette. No acute osseous pathology. IMPRESSION: No active cardiopulmonary disease. Electronically Signed   By: Anner Crete M.D.   On: 09/10/2021 19:56   CT Angio Chest PE W and/or Wo Contrast  Result Date: 09/11/2021 CLINICAL DATA:  High probability for pulmonary embolism. EXAM: CT ANGIOGRAPHY CHEST WITH CONTRAST TECHNIQUE: Multidetector CT imaging of the chest was performed using the standard protocol during bolus administration of intravenous contrast. Multiplanar CT image reconstructions and MIPs were obtained to evaluate the vascular anatomy. CONTRAST:  43mL OMNIPAQUE IOHEXOL 350 MG/ML SOLN COMPARISON:  01/13/2019 chest CTA. FINDINGS: Cardiovascular: Satisfactory opacification of the pulmonary arteries to the segmental level. No evidence of pulmonary embolism. Normal heart size. No pericardial effusion. Coronary atheromatous calcification especially at the LAD. Mediastinum/Nodes: No adenopathy or mass Lungs/Pleura: Central airways are clear. There is no edema, consolidation, effusion, or pneumothorax. Atelectasis over the elevated right diaphragm. Upper Abdomen: Lobulated liver surface and large fissures. Cholecystectomy clips. Musculoskeletal: Spondylosis. Review of the MIP images confirms the above findings. IMPRESSION: 1. Negative for pulmonary embolism. 2. Suspect cirrhosis, please correlate with risk factors. 3. Coronary atherosclerosis. Electronically Signed   By: Jorje Guild M.D.   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  I have reviewed the patient's chart, notable for ultrasound performed 1 week ago with reassuring findings, no evidence for DVT bilaterally.  Chart review also notable for gastroenterology consultation within the past 2 months with ongoing consideration of autoimmune hepatitis/cirrhosis.  Here patient is awake, alert, no hypoxia, no tachycardia, labs, CT interpreted by myself.  CT notable for no pulmonary embolism, no pneumonia, no pneumothorax, no evidence for cardiomyopathy.  2 troponins normal, no evidence for ACS, ischemia, nor dysrhythmia.  With reassuring studies last week and today patient discharged in stable condition to follow-up closely as an outpatient as scheduled tomorrow.        Final Clinical Impression(s) / ED Diagnoses Final diagnoses:  SOB (shortness of breath)     Carmin Muskrat, MD 09/11/21 [phone]

## 2021-09-11 NOTE — Discharge Instructions (Signed)
As discussed, your evaluation today has been largely reassuring.  But, it is important that you monitor your condition carefully, and do not hesitate to return to the ED if you develop new, or concerning changes in your condition. ? ?Otherwise, please follow-up with your physician for appropriate ongoing care. ? ?

## 2021-09-12 ENCOUNTER — Other Ambulatory Visit: Payer: Self-pay

## 2021-09-12 ENCOUNTER — Ambulatory Visit (HOSPITAL_COMMUNITY)
Admission: RE | Admit: 2021-09-12 | Discharge: 2021-09-12 | Disposition: A | Discharge status: Home / Self Care | Payer: Medicare Other | Source: Ambulatory Visit | Attending: Internal Medicine | Admitting: Internal Medicine

## 2021-09-12 DIAGNOSIS — E785 Hyperlipidemia, unspecified: Secondary | ICD-10-CM | POA: Diagnosis not present

## 2021-09-12 DIAGNOSIS — R600 Localized edema: Secondary | ICD-10-CM | POA: Diagnosis not present

## 2021-09-12 DIAGNOSIS — E119 Type 2 diabetes mellitus without complications: Secondary | ICD-10-CM | POA: Diagnosis not present

## 2021-09-12 DIAGNOSIS — I10 Essential (primary) hypertension: Secondary | ICD-10-CM | POA: Diagnosis not present

## 2021-09-25 DIAGNOSIS — E119 Type 2 diabetes mellitus without complications: Secondary | ICD-10-CM | POA: Diagnosis not present

## 2021-09-25 DIAGNOSIS — E1140 Type 2 diabetes mellitus with diabetic neuropathy, unspecified: Secondary | ICD-10-CM | POA: Diagnosis not present

## 2021-09-25 DIAGNOSIS — R600 Localized edema: Secondary | ICD-10-CM | POA: Diagnosis not present

## 2021-10-02 DIAGNOSIS — E1165 Type 2 diabetes mellitus with hyperglycemia: Secondary | ICD-10-CM | POA: Diagnosis not present

## 2021-10-02 DIAGNOSIS — K219 Gastro-esophageal reflux disease without esophagitis: Secondary | ICD-10-CM | POA: Diagnosis not present

## 2021-10-03 ENCOUNTER — Other Ambulatory Visit: Payer: Self-pay

## 2021-10-03 ENCOUNTER — Encounter: Payer: Self-pay | Admitting: Internal Medicine

## 2021-10-03 ENCOUNTER — Ambulatory Visit (INDEPENDENT_AMBULATORY_CARE_PROVIDER_SITE_OTHER): Payer: Medicare Other | Admitting: Internal Medicine

## 2021-10-03 DIAGNOSIS — R0609 Other forms of dyspnea: Secondary | ICD-10-CM | POA: Diagnosis not present

## 2021-10-03 DIAGNOSIS — I10 Essential (primary) hypertension: Secondary | ICD-10-CM | POA: Diagnosis not present

## 2021-10-03 MED ORDER — BISOPROLOL FUMARATE 5 MG PO TABS: 5.0000 mg | ORAL_TABLET | Freq: Every day | ORAL | 11 refills | Status: DC

## 2021-10-03 NOTE — Progress Notes (Signed)
Subjective:    Patient ID: Douglas Mason, male    DOB: [birth year]    MRN: [id number]    Brief patient profile:  74 yowm quit smoking 1979 due to daugher's allergies with new onset  L CP abrupt onset 01/27/14 in setting of chronic am cough on ACEi (stopped 03/10/14 ) with neg w/u and pain resolved by Oct 2015 but persistent sob assoc with wheezing  refractory to rx with even prednisone so referred by Delphina Cahill to pulmonary clinic 08/04/2014     History of Present Illness  08/04/2014 1st [facility] office visit/ Myran Arcia   Chief Complaint  Patient presents with   Pulmonary Consult    Referred by Dr. Wende Neighbors. Pt c/o SOB for the past 6 months. He is SOB with or without any exertion "comes and goes".  He also c/o non prod cough   sudden pattern of lose breath x few secs every day no change since onset 01/27/14  New pattern of doe x steps at usual residence stops at top no change after neb but does help coughing which is dry and worse when lies down Already on ppi but not taking bid ac as rec Neither Symptoms nor need for saba changed while on prednisone pfts neg for any obst 03/15/14  >>changed norvasc to bystolic , PPI/pepcid        01/03/2015 f/u ov/Keeanna [facility] re: pseudoasthma/ upper airway cough syndrome   Chief Complaint  Patient presents with   Follow-up    Pt states breathing is much improved and his cough has resolved. No use of albuterol recently.   chronic doe = MMRC = 0/1 (only sob with hills/ steps but walks nl pace with both)  No noct  Events  Confused with which GERD meds he's still taking (was ppi ac and h2 hs) but following up with Dr [name] now Rec You do not appear to have a significant lung problem  You do a have a problem with your weight in your abdomen that puts pressure on your stomach and may make you cough /wheeze/ and short of breath Work on weight and continue the diet    10/03/2021 Re-establish  ov/[place] office/[place] re: sob maint on no resp meds  Chief  Complaint  Patient presents with   Consult    Ref by dr. [name] for Dyspnea, has been going on for about a month per patient.   Dyspnea:  2 years assoc with leg swelling   Still can do steps at homes x 13/ has trouble bending over due to sob and walking limited by problems with balance and feet hurting /numb burning  Cough: none  Sleeping: at 45 degrees on electric bed - can't lie flat flat x years  SABA use: once a week for "wheezing" hfa only, has neb never used 02: none  Covid status: vax x one J&J      No obvious day to day or daytime variability or assoc excess/ purulent sputum or mucus plugs or hemoptysis or cp or chest tightness,  or overt sinus or hb symptoms.   Sleeping as above without nocturnal  or early am exacerbation  of respiratory  c/o's or need for noct saba. Also denies any obvious fluctuation of symptoms with weather or environmental changes or other aggravating or alleviating factors except as outlined above   No unusual exposure hx or h/o childhood pna/ asthma or knowledge of premature birth.  Current Allergies, Complete Past Medical History, Past Surgical History, Family History,  and Social History were reviewed in [employer] record.  ROS  The following are not active complaints unless bolded Hoarseness, sore throat, dysphagia, dental problems, itching, sneezing,  nasal congestion or discharge of excess mucus or purulent secretions, ear ache,   fever, chills, sweats, unintended wt loss or wt gain, classically pleuritic or exertional cp,  orthopnea pnd or arm/hand swelling  or leg swelling, presyncope, palpitations, abdominal pain, anorexia, nausea, vomiting, diarrhea  or change in bowel habits or change in bladder habits, change in stools or change in urine, dysuria, hematuria,  rash, arthralgias, visual complaints, headache, numbness, weakness or ataxia or problems with walking or coordination,  change in mood or  memory.        Current Meds   Medication Sig   albuterol (PROVENTIL HFA;VENTOLIN HFA) 108 (90 BASE) MCG/ACT inhaler Inhale 2 puffs into the lungs every 4 (four) hours as needed for wheezing or shortness of breath.   albuterol (PROVENTIL) (2.5 MG/3ML) 0.083% nebulizer solution Take 3 mLs (2.5 mg total) by nebulization every 6 (six) hours as needed for wheezing or shortness of breath.   ALPRAZolam (XANAX) 0.5 MG tablet Take 0.5 mg by mouth at bedtime as needed for anxiety.   aspirin EC 81 MG tablet Take 81 mg by mouth daily.   atorvastatin (LIPITOR) 40 MG tablet Take 20 mg by mouth daily.   azaTHIOprine (IMURAN) 50 MG tablet TAKE ONE TABLET BY MOUTH ONCE DAILY   furosemide (LASIX) 20 MG tablet Take 20 mg by mouth daily.   furosemide (LASIX) 40 MG tablet Take 40 mg by mouth daily.   glipiZIDE (GLUCOTROL XL) 10 MG 24 hr tablet Take 1 tablet by mouth 2 (two) times a day.   HYDROcodone-acetaminophen (NORCO/VICODIN) 5-325 MG tablet Take 1 tablet by mouth at bedtime as needed for pain.   JARDIANCE 25 MG TABS tablet Take 25 mg by mouth daily.   losartan (COZAAR) 50 MG tablet Take 50 mg by mouth daily.   metFORMIN (GLUCOPHAGE-XR) 500 MG 24 hr tablet Take 2 tablets (1,000 mg total) by mouth 2 (two) times a day.   Multiple Vitamins-Minerals (MULTIVITAMINS THER. W/MINERALS) TABS tablet Take 1 tablet by mouth daily.   pantoprazole (PROTONIX) 40 MG tablet Take 1 tablet (40 mg total) by mouth 2 (two) times daily before a meal. (Patient taking differently: Take 40 mg by mouth at bedtime.)   potassium chloride (KLOR-CON) 10 MEQ tablet Take 10 mEq by mouth daily.   potassium chloride SA (KLOR-CON M) 20 MEQ tablet Take 20 mEq by mouth daily.   pregabalin (LYRICA) 50 MG capsule Take 50 mg by mouth 3 (three) times daily.   propranolol ER (INDERAL LA) 60 MG 24 hr capsule Take 60 mg by mouth daily.   TRESIBA FLEXTOUCH 100 UNIT/ML FlexTouch Pen Inject 10 Units into the skin daily.                  Objective:   Physical Exam       10/03/2021        230    01/03/15 245 lb (111.131 kg)  08/18/14 235 lb 3.2 oz (106.686 kg)  08/04/14 231 lb (104.781 kg)     Vital signs reviewed  10/03/2021  - Note at rest 02 sats  96% on RA   General appearance:    obese wm nad / walks with suction cup cane    HEENT : pt wearing mask not removed for exam due to covid -19 concerns.  NECK :  without JVD/Nodes/TM/ nl carotid upstrokes bilaterally   LUNGS: no acc muscle use,  Nl contour chest which is clear to A and P bilaterally without cough on insp or exp maneuvers   CV:  RRR  no s3 or murmur or increase in P2, and no edema   ABD: qjuite  obese/ pot belly deformity more obvious in standing position, but soft and nontender with nl inspiratory excursion in the supine position. No bruits or organomegaly appreciated, bowel sounds nl  MS:  slt awkward  gait/ ext warm without deformities, calf tenderness, cyanosis or clubbing No obvious joint restrictions   SKIN: warm and dry without lesions    NEURO:  alert, approp, nl sensorium with  no motor or cerebellar deficits apparent.      I personally reviewed images and agree with radiology impression as follows:   Chest CTa 09/11/21 1. Negative for pulmonary embolism. 2. Suspect cirrhosis, please correlate with risk factors. 3. Coronary atherosclerosis.   Labs ordered/ reviewed:      Chemistry      Component Value Date/Time   NA 137 09/10/2021 1957   K 4.1 09/10/2021 1957   CL 100 09/10/2021 1957   CO2 26 09/10/2021 1957   BUN 19 09/10/2021 1957   CREATININE 1.14 09/10/2021 1957   CREATININE 0.82 06/27/2021 0715      Component Value Date/Time   CALCIUM 9.2 09/10/2021 1957   ALKPHOS 70 09/10/2021 1957   AST 29 09/10/2021 1957   ALT 26 09/10/2021 1957   BILITOT 0.7 09/10/2021 1957        Lab Results  Component Value Date   WBC 11.4 (H) 09/10/2021   HGB 14.6 09/10/2021   HCT 44.7 09/10/2021   MCV 93.7 09/10/2021   PLT 403 (H) 09/10/2021       Eos                                                                 0.2                                   10/03/2021   Lab Results  Component Value Date   DDIMER 0.52 (H) 09/10/2021          BNP  09/10/21        = 11     Troponin   09/10/21  = 7        Assessment & Plan:

## 2021-10-03 NOTE — Assessment & Plan Note (Addendum)
In the setting of respiratory symptoms of unknown etiology,  It would be preferable to use bystolic, the most beta -1  selective Beta blocker available in sample form, with bisoprolol the most selective generic choice  on the market, at least on a trial basis, to make sure the spillover Beta 2 effects of the less specific Beta blockers are not contributing to this patient's symptoms.   >>> rec bisoprolol 5 mg daily , f/u PCP  Each maintenance medication was reviewed in detail including emphasizing most importantly the difference between maintenance and prns and under what circumstances the prns are to be triggered using an action plan format where appropriate.  Total time for H and P, chart review, counseling,  directly observing portions of ambulatory 02 saturation study/ and generating customized AVS unique to this office visit / same day charting  > 45 min for pt not seen in > 3 y

## 2021-10-03 NOTE — Assessment & Plan Note (Signed)
Onset 2015  - PFT s  03/15/14 no obst/ VC 2.82   - 08/04/2014  Walked RA x 3 laps @ 185 ft each stopped due to  End of study, rapid pace, no desat  - 08/04/14  Spirometry wnl with VC 2.86  - 10/03/2021   Walked on RA  x  3  lap(s) =  approx 450  ft  @ mod fast pace, stopped due to end of study with lowest 02 sats 93% s sob    Symptoms are markedly disproportionate to objective findings and not clear to what extent this is actually a pulmonary  problem but pt does appear to have difficult to sort out respiratory symptoms of unknown origin for which  DDX  = almost all start with A and  include Adherence, Ace Inhibitors, Acid Reflux, Active Sinus Disease, Alpha 1 Antitripsin deficiency, Anxiety masquerading as Airways dz,  ABPA,  Allergy(esp in young), Aspiration (esp in elderly), Adverse effects of meds,  Active smoking or Vaping, A bunch of PE's/clot burden (a few small clots can't cause this syndrome unless there is already severe underlying pulm or vascular dz with poor reserve),  Anemia or thyroid disorder, plus two Bs  = Bronchiectasis and Beta blocker use..and one C= CHF     Adherence is always the initial "prime suspect" and is a multilayered concern that requires a "trust but verify" approach in every patient - starting with knowing how to use medications, especially inhalers, correctly, keeping up with refills and understanding the fundamental difference between maintenance and prns vs those medications only taken for a very short course and then stopped and not refilled.  - very iffy on meds/ names  ? Acid (or non-acid) GERD > always difficult to exclude as up to 75% of pts in some series report no assoc GI/ Heartburn symptoms> rec max (24h)  acid suppression  (take ppi ac)   ? Anxiety/depression/ deconditioning  > usually at the bottom of this list of usual suspects but should be much higher on this pt's based on H and P and note   may interfere with adherence and also interpretation of response or  lack thereof to symptom management which can be quite subjective.   ? A bunch of pe's D dimer nl - while a normal  or high normal value (seen commonly in the elderly or chronically ill)  may miss small peripheral pe, the clot burden with sob is moderately high and the d dimer  has a very high neg pred value if used in this setting> confirmed on CTa 09/11/21  NO PE  ? Beta blocker effects (see separate a/p)  ? CHF > unlikely we bnp so low but agree with w/u given assoc leg swelling   Note will be very hard to tell if we help his breathing if he does not truly have reprocible doe walking while still able to do steps at home s difficulty

## 2021-10-03 NOTE — Patient Instructions (Addendum)
Stop propranolol and start bisoprolol  mg daily    Discuss you foot pain /numbness with Dr [name] but if you can't walk fast enough on a flat surface to make you short of breath because your feet stop there is nothing we can do to improve your exercise capacity (that is, your ability to walk fast)   Change pantoprazole to Take 30-60 min before last meal of the day    Pulmonary follow up is as needed

## 2021-10-09 DIAGNOSIS — R7989 Other specified abnormal findings of blood chemistry: Secondary | ICD-10-CM | POA: Diagnosis not present

## 2021-10-09 LAB — HEPATIC FUNCTION PANEL
AG Ratio: 1.8 (calc) (ref 1.0–2.5)
ALT: 18 U/L (ref 9–46)
AST: 18 U/L (ref 10–35)
Albumin: 4.3 g/dL (ref 3.6–5.1)
Alkaline phosphatase (APISO): 63 U/L (ref 35–144)
Bilirubin, Direct: 0.2 mg/dL (ref 0.0–0.2)
Globulin: 2.4 g/dL (calc) (ref 1.9–3.7)
Indirect Bilirubin: 0.5 mg/dL (calc) (ref 0.2–1.2)
Total Bilirubin: 0.7 mg/dL (ref 0.2–1.2)
Total Protein: 6.7 g/dL (ref 6.1–8.1)

## 2021-10-19 ENCOUNTER — Other Ambulatory Visit: Payer: Self-pay

## 2021-10-19 DIAGNOSIS — R7989 Other specified abnormal findings of blood chemistry: Secondary | ICD-10-CM

## 2021-10-25 ENCOUNTER — Institutional Professional Consult (permissible substitution): Payer: Medicare Other | Admitting: Internal Medicine

## 2021-10-30 DIAGNOSIS — K219 Gastro-esophageal reflux disease without esophagitis: Secondary | ICD-10-CM | POA: Diagnosis not present

## 2021-10-30 DIAGNOSIS — E1165 Type 2 diabetes mellitus with hyperglycemia: Secondary | ICD-10-CM | POA: Diagnosis not present

## 2021-10-31 ENCOUNTER — Ambulatory Visit (INDEPENDENT_AMBULATORY_CARE_PROVIDER_SITE_OTHER): Payer: Medicare Other | Admitting: Internal Medicine

## 2021-10-31 ENCOUNTER — Other Ambulatory Visit: Payer: Self-pay

## 2021-10-31 ENCOUNTER — Encounter: Payer: Self-pay | Admitting: Internal Medicine

## 2021-10-31 VITALS — BP 108/60 | HR 66 | Ht 72.0 in | Wt 234.0 lb

## 2021-10-31 DIAGNOSIS — R931 Abnormal findings on diagnostic imaging of heart and coronary circulation: Secondary | ICD-10-CM | POA: Diagnosis not present

## 2021-10-31 DIAGNOSIS — R0602 Shortness of breath: Secondary | ICD-10-CM | POA: Diagnosis not present

## 2021-10-31 DIAGNOSIS — R600 Localized edema: Secondary | ICD-10-CM | POA: Insufficient documentation

## 2021-10-31 NOTE — Patient Instructions (Signed)
Testing/Procedures: ?Your physician has requested that you have an echocardiogram. Echocardiography is a painless test that uses sound waves to create images of your heart. It provides your doctor with information about the size and shape of your heart and how well your heart?s chambers and valves are working. This procedure takes approximately one hour. There are no restrictions for this procedure. ? ? ?Follow-Up: ?Follow up with Dr. Gasper Sells in 6 months ? ?Any Other Special Instructions Will Be Listed Below (If Applicable). ? ? ? ? ?If you need a refill on your cardiac medications before your next appointment, please call your pharmacy. ? ?

## 2021-10-31 NOTE — Progress Notes (Signed)
?Cardiology Office Note:   ? ?Date:  10/31/2021  ? ?ID:  Douglas Mason, DOB 17-Mar-1951, MRN [id number] ? ?PCP:  Celene Squibb, MD ?  ?[facility] Providers ?Cardiologist:  None    ? ?Referring MD: Celene Squibb, MD  ? ?CC: SOB ?Consulted for the evaluation of SOB and LE edema at the behest of Celene Squibb, MD ? ?History of Present Illness:   ? ?Douglas Mason is a 71 y.o. male with a hx of HTN with DM, HLD with DM,no  OSA who presents after ED visit for SOB and LE edema. ? ?Patient notes that he is feeling better since his two-three months eval for SOB.  He was evaluated for asthma and had a non-selective BB; switched to bystolic and things have improved.  Leg swelling has improved on lasix.   Has had no chest pain, chest pressure, chest tightness, chest stinging.  Patient exertion notable for going down the stairs feels no symptoms.  No shortness of breath, no DOE.  No PND or orthopnea.  No weight gain, leg swelling , or abdominal swelling.  No syncope or near syncope . Notes  no palpitations or funny heart beats.    ? ?Past Medical History:  ?Diagnosis Date  ? Bronchitis   ? Diabetes mellitus without complication ([place])   ? GERD (gastroesophageal reflux disease)   ? High cholesterol   ? Hypertension   ? Pleurisy   ? Pneumonia   ? Sleep apnea   ? nurse in pre-op observed pt sats drop to 80-82% while sleeping on RA,wake up would go to 87%  ? ? ?Past Surgical History:  ?Procedure Laterality Date  ? CHOLECYSTECTOMY N/A 01/13/2019  ? Procedure: LAPAROSCOPIC CHOLECYSTECTOMY;  Surgeon: Virl Cagey, MD;  Location: [facility];  Service: General;  Laterality: N/A;  ? ? ?Current Medications: ?Current Meds  ?Medication Sig  ? albuterol (PROVENTIL HFA;VENTOLIN HFA) 108 (90 BASE) MCG/ACT inhaler Inhale 2 puffs into the lungs every 4 (four) hours as needed for wheezing or shortness of breath.  ? albuterol (PROVENTIL) (2.5 MG/3ML) 0.083% nebulizer solution Take 3 mLs (2.5 mg total) by nebulization every 6 (six) hours as  needed for wheezing or shortness of breath.  ? ALPRAZolam (XANAX) 0.5 MG tablet Take 0.5 mg by mouth at bedtime as needed for anxiety.  ? aspirin EC 81 MG tablet Take 81 mg by mouth daily.  ? atorvastatin (LIPITOR) 40 MG tablet Take 20 mg by mouth daily.  ? azaTHIOprine (IMURAN) 50 MG tablet TAKE ONE TABLET BY MOUTH ONCE DAILY  ? bisoprolol (ZEBETA) 5 MG tablet Take 1 tablet (5 mg total) by mouth daily.  ? furosemide (LASIX) 40 MG tablet Take 40 mg by mouth daily.  ? glipiZIDE (GLUCOTROL XL) 10 MG 24 hr tablet Take 1 tablet by mouth 2 (two) times a day.  ? HYDROcodone-acetaminophen (NORCO/VICODIN) 5-325 MG tablet Take 1 tablet by mouth at bedtime as needed for pain.  ? JARDIANCE 25 MG TABS tablet Take 25 mg by mouth daily.  ? losartan (COZAAR) 50 MG tablet Take 50 mg by mouth daily.  ? metFORMIN (GLUCOPHAGE-XR) 500 MG 24 hr tablet Take 2 tablets (1,000 mg total) by mouth 2 (two) times a day.  ? Multiple Vitamins-Minerals (MULTIVITAMINS THER. W/MINERALS) TABS tablet Take 1 tablet by mouth daily.  ? pantoprazole (PROTONIX) 40 MG tablet Take 1 tablet (40 mg total) by mouth 2 (two) times daily before a meal. (Patient taking differently: Take 40 mg by mouth  at bedtime.)  ? potassium chloride SA (KLOR-CON M) 20 MEQ tablet Take 20 mEq by mouth daily.  ? pregabalin (LYRICA) 50 MG capsule Take 50 mg by mouth 3 (three) times daily.  ? TRESIBA FLEXTOUCH 100 UNIT/ML FlexTouch Pen Inject 10 Units into the skin daily.  ?  ? ?Allergies:   Lisinopril  ? ?Social History  ? ?Socioeconomic History  ? Marital status: Married  ?  Spouse name: Not on file  ? Number of children: Not on file  ? Years of education: Not on file  ? Highest education level: Not on file  ?Occupational History  ?  Employer: [employer]  ?  Comment: [employer]  ?Tobacco Use  ? Smoking status: Former  ?  Packs/day: 2.00  ?  Years: 30.00  ?  Pack years: 60.00  ?  Types: Cigarettes  ?  Quit date: 09/02/1976  ?  Years since quitting: 45.1  ? Smokeless tobacco: Former  ?   Types: Chew  ?  Quit date: 09/03/2003  ?Vaping Use  ? Vaping Use: Never used  ?Substance and Sexual Activity  ? Alcohol use: No  ? Drug use: No  ? Sexual activity: Yes  ?Other Topics Concern  ? Not on file  ?Social History Narrative  ? Not on file  ? ?Social Determinants of Health  ? ?Financial Resource Strain: Not on file  ?Food Insecurity: Not on file  ?Transportation Needs: Not on file  ?Physical Activity: Not on file  ?Stress: Not on file  ?Social Connections: Not on file  ?  ? ?Family History: ?The patient's family history includes Asthma in his mother; Leukemia in his mother; Rheum arthritis in his maternal grandmother and mother. ? ?ROS:   ?Please see the history of present illness.    ? All other systems reviewed and are negative. ? ?EKGs/Labs/Other Studies Reviewed:   ? ?The following studies were reviewed today: ? ?CTPE: ?Date: ?Results: 09/11/21 ?IMPRESSION: ?1. Negative for pulmonary embolism. ?2. Suspect cirrhosis, please correlate with risk factors. ?3. P-LAD CAC ? ? ?Recent Labs: ?09/10/2021: B Natriuretic Peptide 11.3; BUN 19; Creatinine, Ser 1.14; Hemoglobin 14.6; Platelets 403; Potassium 4.1; Sodium 137 ?10/09/2021: ALT 18  ?Recent Lipid Panel ?   ?Component Value Date/Time  ? CHOL 122 03/13/2014 1425  ? TRIG 68 03/13/2014 1425  ? HDL 34 (L) 03/13/2014 1425  ? CHOLHDL 3.6 03/13/2014 1425  ? VLDL 14 03/13/2014 1425  ? [place] 74 03/13/2014 1425  ? ? ? ?Physical Exam:   ? ?VS:  BP 108/60   Pulse 66   Ht 6' (1.829 m)   Wt 234 lb (106.1 kg)   SpO2 97%   BMI 31.74 kg/m?    ? ?Wt Readings from Last 3 Encounters:  ?10/31/21 234 lb (106.1 kg)  ?10/03/21 230 lb (104.3 kg)  ?06/12/21 236 lb 9.6 oz (107.3 kg)  ?  ?Gen: No distress ?Neck: No JVD ?Cardiac: No Rubs or Gallops, no murmur, RRR +2 radial pulses ?Respiratory: Clear to auscultation bilaterally, normal effort, normal  respiratory rate ?GI: Soft, nontender, non-distended  ?MS: +1 bilateral pitting edema;  moves all extremities ?Integument: Skin feels  warm ?Neuro:  At time of evaluation, alert and oriented to person/place/time/situation  ?Psych: Normal affect, patient feels well ? ? ?ASSESSMENT:   ? ?1. SOB (shortness of breath)   ?2. Lower extremity edema   ?3. Elevated coronary artery calcium score   ? ?PLAN:   ? ?SOB ?LE Edema ?HTN, HLD, and DM ?Distant [place] 2015 (  telemetry 5 minutes do not have strips- short burden) ?- started on bisoprolol by Pulm ?- reviewed LAD CAC with patient and wife ?- continue ASA, atorvastatin 40, LDL at 56 (near goal) ?- continue lasix 40 mg PO daily, low threshold to do SGLT2i ?- losartan 50 mg daily ?-  Will get echo ?- low threshold to place heart monitor and start AC if returnes ? ?- 6 months me or APP ?   ? ? ?Medication Adjustments/Labs and Tests Ordered: ?Current medicines are reviewed at length with the patient today.  Concerns regarding medicines are outlined above.  ?Orders Placed This Encounter  ?Procedures  ? ECHOCARDIOGRAM COMPLETE  ? ?No orders of the defined types were placed in this encounter. ? ? ?Patient Instructions  ?Testing/Procedures: ?Your physician has requested that you have an echocardiogram. Echocardiography is a painless test that uses sound waves to create images of your heart. It provides your doctor with information about the size and shape of your heart and how well your heart?s chambers and valves are working. This procedure takes approximately one hour. There are no restrictions for this procedure. ? ? ?Follow-Up: ?Follow up with Dr. Gasper Sells in 6 months ? ?Any Other Special Instructions Will Be Listed Below (If Applicable). ? ? ? ? ?If you need a refill on your cardiac medications before your next appointment, please call your pharmacy. ?  ? ?Signed, ?Werner Lean, MD  ?10/31/2021 1:39 PM    ?[place] ? ?

## 2021-11-23 ENCOUNTER — Ambulatory Visit (HOSPITAL_COMMUNITY)
Admission: RE | Admit: 2021-11-23 | Discharge: 2021-11-23 | Disposition: A | Discharge status: Home / Self Care | Payer: Medicare Other | Source: Ambulatory Visit | Attending: Internal Medicine | Admitting: Internal Medicine

## 2021-11-23 DIAGNOSIS — R600 Localized edema: Secondary | ICD-10-CM | POA: Insufficient documentation

## 2021-11-23 DIAGNOSIS — R0602 Shortness of breath: Secondary | ICD-10-CM | POA: Diagnosis not present

## 2021-11-23 LAB — ECHOCARDIOGRAM COMPLETE
Area-P 1/2: 3.42 cm2
S' Lateral: 3.5 cm

## 2021-11-23 NOTE — Progress Notes (Signed)
*  PRELIMINARY RESULTS* ?Echocardiogram ?2D Echocardiogram has been performed. ? ?Douglas Mason ?11/23/2021, 2:53 PM ?

## 2021-11-30 DIAGNOSIS — K219 Gastro-esophageal reflux disease without esophagitis: Secondary | ICD-10-CM | POA: Diagnosis not present

## 2021-11-30 DIAGNOSIS — E1165 Type 2 diabetes mellitus with hyperglycemia: Secondary | ICD-10-CM | POA: Diagnosis not present

## 2021-12-05 DIAGNOSIS — E119 Type 2 diabetes mellitus without complications: Secondary | ICD-10-CM | POA: Diagnosis not present

## 2021-12-05 DIAGNOSIS — E782 Mixed hyperlipidemia: Secondary | ICD-10-CM | POA: Diagnosis not present

## 2021-12-10 ENCOUNTER — Encounter: Payer: Self-pay | Admitting: Internal Medicine

## 2021-12-10 DIAGNOSIS — E669 Obesity, unspecified: Secondary | ICD-10-CM | POA: Diagnosis not present

## 2021-12-10 DIAGNOSIS — R600 Localized edema: Secondary | ICD-10-CM | POA: Diagnosis not present

## 2021-12-10 DIAGNOSIS — R109 Unspecified abdominal pain: Secondary | ICD-10-CM | POA: Diagnosis not present

## 2021-12-10 DIAGNOSIS — E782 Mixed hyperlipidemia: Secondary | ICD-10-CM | POA: Diagnosis not present

## 2021-12-10 DIAGNOSIS — F419 Anxiety disorder, unspecified: Secondary | ICD-10-CM | POA: Diagnosis not present

## 2021-12-10 DIAGNOSIS — K219 Gastro-esophageal reflux disease without esophagitis: Secondary | ICD-10-CM | POA: Diagnosis not present

## 2021-12-10 DIAGNOSIS — Z6831 Body mass index (BMI) 31.0-31.9, adult: Secondary | ICD-10-CM | POA: Diagnosis not present

## 2021-12-10 DIAGNOSIS — I10 Essential (primary) hypertension: Secondary | ICD-10-CM | POA: Diagnosis not present

## 2021-12-10 DIAGNOSIS — R251 Tremor, unspecified: Secondary | ICD-10-CM | POA: Diagnosis not present

## 2021-12-10 DIAGNOSIS — E119 Type 2 diabetes mellitus without complications: Secondary | ICD-10-CM | POA: Diagnosis not present

## 2021-12-30 DIAGNOSIS — E782 Mixed hyperlipidemia: Secondary | ICD-10-CM | POA: Diagnosis not present

## 2021-12-30 DIAGNOSIS — E1140 Type 2 diabetes mellitus with diabetic neuropathy, unspecified: Secondary | ICD-10-CM | POA: Diagnosis not present

## 2021-12-30 DIAGNOSIS — I10 Essential (primary) hypertension: Secondary | ICD-10-CM | POA: Diagnosis not present

## 2021-12-30 DIAGNOSIS — K219 Gastro-esophageal reflux disease without esophagitis: Secondary | ICD-10-CM | POA: Diagnosis not present

## 2022-01-03 ENCOUNTER — Other Ambulatory Visit: Payer: Self-pay

## 2022-01-03 DIAGNOSIS — R7989 Other specified abnormal findings of blood chemistry: Secondary | ICD-10-CM

## 2022-01-16 ENCOUNTER — Encounter: Payer: Self-pay | Admitting: Internal Medicine

## 2022-01-16 ENCOUNTER — Ambulatory Visit (INDEPENDENT_AMBULATORY_CARE_PROVIDER_SITE_OTHER): Payer: Medicare Other | Admitting: Internal Medicine

## 2022-01-16 VITALS — BP 139/77 | HR 69 | Temp 97.0°F | Ht 72.0 in | Wt 228.8 lb

## 2022-01-16 DIAGNOSIS — K219 Gastro-esophageal reflux disease without esophagitis: Secondary | ICD-10-CM | POA: Diagnosis not present

## 2022-01-16 DIAGNOSIS — K754 Autoimmune hepatitis: Secondary | ICD-10-CM

## 2022-01-16 MED ORDER — AZATHIOPRINE 50 MG PO TABS: 50.0000 mg | ORAL_TABLET | Freq: Every day | ORAL | 11 refills | Status: AC

## 2022-01-16 NOTE — Progress Notes (Signed)
? ? ?Referring Provider: Celene Squibb, MD ?Primary Care Physician:  Celene Squibb, MD ?Primary GI:  Dr. Abbey Chatters ? ?Chief Complaint  ?Patient presents with  ? Follow-up  ? ? ?HPI:   ?Douglas Mason is a 71 y.o. male  who presents to the clinic today for follow-up visit.  Previously referred for abnormal LFTs.  Subsequent work-up as follows: ?  ?Ultrasound with elastography 10/04/2020 showed coarsened hepatic echotexture with subtle contour nodularity suspicious for early mild hepatic cirrhosis, and median kPA 8.1. ?  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   ?  ?Subsequent liver biopsy 10/12/2020 with patchy but prominent portal and lobular inflammation composed of lymphocytes and plasma cells with accompanying moderate to focally severe interface hepatitis and associated periportal hepatocyte necrosis.  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  Also noted patchy advanced fibrosis ?  ?On 10/20/2020, patient was placed on prednisone 60 mg daily for 1 week then 40 mg daily for 1 week then 30 mg daily for 1 week and has been on 20 mg daily since that time.   ?  ?LFTs 11/13/2020 showed normalization of his AST 27, ALT 46, T bili 0.9 alk phos 49. ? ?Patient ran out of prednisone for a few weeks and did not call our office.  I restarted him on prednisone 20 mg daily and LFTs 12/21/2020 showed normalization.  Continue PT status was checked, normal. Was then started on Azathioprine April 2022 and subsequently weaned completely off of prednisone. ?  ?No complaints for me today.  Currently doing well.  Continues azathioprine 50 mg daily.  Has had normal LFTs for over a year now. ? ?Past Medical History:  ?Diagnosis Date  ? Bronchitis   ? Diabetes mellitus without complication ([place])   ? GERD (gastroesophageal reflux disease)   ? High cholesterol   ? Hypertension   ? Pleurisy   ? Pneumonia   ? Sleep apnea   ? nurse in pre-op observed pt sats drop to 80-82% while sleeping on RA,wake up would go to 87%   ? ? ?Past Surgical History:  ?Procedure Laterality Date  ? CHOLECYSTECTOMY N/A 01/13/2019  ? Procedure: LAPAROSCOPIC CHOLECYSTECTOMY;  Surgeon: Virl Cagey, MD;  Location: [facility];  Service: General;  Laterality: N/A;  ? ? ?Current Outpatient Medications  ?Medication Sig Dispense Refill  ? albuterol (PROVENTIL HFA;VENTOLIN HFA) 108 (90 BASE) MCG/ACT inhaler Inhale 2 puffs into the lungs every 4 (four) hours as needed for wheezing or shortness of breath. 1 Inhaler 1  ? albuterol (PROVENTIL) (2.5 MG/3ML) 0.083% nebulizer solution Take 3 mLs (2.5 mg total) by nebulization every 6 (six) hours as needed for wheezing or shortness of breath. 75 mL 12  ? ALPRAZolam (XANAX) 0.5 MG tablet Take 0.5 mg by mouth at bedtime as needed for anxiety.    ? aspirin EC 81 MG tablet Take 81 mg by mouth daily.    ? atorvastatin (LIPITOR) 40 MG tablet Take 20 mg by mouth daily.    ? bisoprolol (ZEBETA) 5 MG tablet Take 1 tablet (5 mg total) by mouth daily. 30 tablet 11  ? furosemide (LASIX) 40 MG tablet Take 40 mg by mouth daily.    ? glipiZIDE (GLUCOTROL XL) 10 MG 24 hr tablet Take 1 tablet by mouth 2 (two) times a day.    ? HYDROcodone-acetaminophen (NORCO/VICODIN) 5-325 MG tablet Take 1 tablet by mouth at bedtime as needed for pain.    ? JARDIANCE 25 MG TABS tablet Take 25  mg by mouth daily.    ? losartan (COZAAR) 50 MG tablet Take 50 mg by mouth daily.    ? metFORMIN (GLUCOPHAGE-XR) 500 MG 24 hr tablet Take 2 tablets (1,000 mg total) by mouth 2 (two) times a day.    ? Multiple Vitamins-Minerals (MULTIVITAMINS THER. W/MINERALS) TABS tablet Take 1 tablet by mouth daily.    ? pantoprazole (PROTONIX) 40 MG tablet Take 1 tablet (40 mg total) by mouth 2 (two) times daily before a meal. (Patient taking differently: Take 40 mg by mouth at bedtime.) 60 tablet 0  ? potassium chloride SA (KLOR-CON M) 20 MEQ tablet Take 20 mEq by mouth daily.    ? pregabalin (LYRICA) 50 MG capsule Take 50 mg by mouth 3 (three) times daily.    ? TRESIBA  FLEXTOUCH 100 UNIT/ML FlexTouch Pen Inject 10 Units into the skin daily.    ? azaTHIOprine (IMURAN) 50 MG tablet Take 1 tablet (50 mg total) by mouth daily. 30 tablet 11  ? ?No current facility-administered medications for this visit.  ? ? ?Allergies as of 01/16/2022 - Review Complete 01/16/2022  ?Allergen Reaction Noted  ? Lisinopril Cough 10/09/2020  ? ? ?Family History  ?Problem Relation Age of Onset  ? Asthma Mother   ? Leukemia Mother   ? Rheum arthritis Mother   ? Rheum arthritis Maternal Grandmother   ? ? ?Social History  ? ?Socioeconomic History  ? Marital status: Married  ?  Spouse name: Not on file  ? Number of children: Not on file  ? Years of education: Not on file  ? Highest education level: Not on file  ?Occupational History  ?  Employer: [employer]  ?  Comment: [employer]  ?Tobacco Use  ? Smoking status: Former  ?  Packs/day: 2.00  ?  Years: 30.00  ?  Pack years: 60.00  ?  Types: Cigarettes  ?  Quit date: 09/02/1976  ?  Years since quitting: 45.4  ? Smokeless tobacco: Former  ?  Types: Chew  ?  Quit date: 09/03/2003  ?Vaping Use  ? Vaping Use: Never used  ?Substance and Sexual Activity  ? Alcohol use: No  ? Drug use: No  ? Sexual activity: Yes  ?Other Topics Concern  ? Not on file  ?Social History Narrative  ? Not on file  ? ?Social Determinants of Health  ? ?Financial Resource Strain: Not on file  ?Food Insecurity: Not on file  ?Transportation Needs: Not on file  ?Physical Activity: Not on file  ?Stress: Not on file  ?Social Connections: Not on file  ? ? ?Subjective: ?Review of Systems  ?Constitutional:  Negative for chills and fever.  ?HENT:  Negative for congestion and hearing loss.   ?Eyes:  Negative for blurred vision and double vision.  ?Respiratory:  Negative for cough and shortness of breath.   ?Cardiovascular:  Negative for chest pain and palpitations.  ?Gastrointestinal:  Negative for abdominal pain, blood in stool, constipation, diarrhea, heartburn, melena and vomiting.  ?Genitourinary:  Negative  for dysuria and urgency.  ?Musculoskeletal:  Negative for joint pain and myalgias.  ?Skin:  Negative for itching and rash.  ?Neurological:  Negative for dizziness and headaches.  ?Psychiatric/Behavioral:  Negative for depression. The patient is not nervous/anxious.   ? ? ?Objective: ?BP 139/77   Pulse 69   Temp (!) 97 ?F (36.1 ?C)   Ht 6' (1.829 m)   Wt 228 lb 12.8 oz (103.8 kg)   BMI 31.03 kg/m?  ?Physical Exam ?Constitutional:   ?  Appearance: Normal appearance.  ?HENT:  ?   Head: Normocephalic and atraumatic.  ?Eyes:  ?   Extraocular Movements: Extraocular movements intact.  ?   Conjunctiva/sclera: Conjunctivae normal.  ?Cardiovascular:  ?   Rate and Rhythm: Normal rate and regular rhythm.  ?Pulmonary:  ?   Effort: Pulmonary effort is normal.  ?   Breath sounds: Normal breath sounds.  ?Abdominal:  ?   General: Bowel sounds are normal.  ?   Palpations: Abdomen is soft.  ?Musculoskeletal:     ?   General: Normal range of motion.  ?   Cervical back: Normal range of motion and neck supple.  ?Skin: ?   General: Skin is warm.  ?Neurological:  ?   General: No focal deficit present.  ?   Mental Status: He is alert and oriented to person, place, and time.  ?Psychiatric:     ?   Mood and Affect: Mood normal.     ?   Behavior: Behavior normal.  ? ? ? ?Assessment: ?*Autoimmune hepatitis-remission ?*Chronic GERD-well-controlled on pantoprazole ? ?Plan: ?Discussed autoimmune hepatitis in depth with patient today.  Discussed long-term implications, risk of relapse, chronic therapy. ? ?TPMT status normal.  LFTs, IgG, and GGT have normalized. ? ?Tolerating azathioprine without any issues.  Continue on 50 mg daily.  Refilled today. ? ?Recheck blood work today.  Can discuss potentially stopping azathioprine on follow-up visit as it will be over 18 months of therapy.  ? ?GERD well-controlled on pantoprazole.  We will continue. ? ?Follow-up in 6 months or sooner if needed. ? ?01/16/2022 11:35 AM ? ? ?Disclaimer: This note was  dictated with voice recognition software. Similar sounding words can inadvertently be transcribed and may not be corrected upon review. ? ?

## 2022-01-16 NOTE — Patient Instructions (Signed)
I am happy to hear you are doing well.  Continue on azathioprine 50 mg daily. ? ?We will check blood work today at [facility].  You will need a CBC and a CMP performed.  These are already ordered. ? ?Otherwise follow-up with GI in 6 months. ? ?It was great seeing you again today. ? ?Dr. Abbey Chatters ? ?At [facility] we value your feedback. You may receive a survey about your visit today. Please share your experience as we strive to create trusting relationships with our patients to provide genuine, compassionate, quality care. ? ?We appreciate your understanding and patience as we review any laboratory studies, imaging, and other diagnostic tests that are ordered as we care for you. Our office policy is 5 business days for review of these results, and any emergent or urgent results are addressed in a timely manner for your best interest. If you do not hear from our office in 1 week, please contact us.  ? ?We also encourage the use of MyChart, which contains your medical information for your review as well. If you are not enrolled in this feature, an access code is on this after visit summary for your convenience. Thank you for allowing [place] to be involved in your care. ? ?It was great to see you today!  I hope you have a great rest of your Spring! ? ? ? ?Elon Alas. Abbey Chatters, D.O. ?Gastroenterology and Hepatology ?[facility] ? ?

## 2022-02-12 DIAGNOSIS — R7989 Other specified abnormal findings of blood chemistry: Secondary | ICD-10-CM | POA: Diagnosis not present

## 2022-02-13 LAB — COMPREHENSIVE METABOLIC PANEL
AG Ratio: 1.6 (calc) (ref 1.0–2.5)
ALT: 21 U/L (ref 9–46)
AST: 22 U/L (ref 10–35)
Albumin: 4.3 g/dL (ref 3.6–5.1)
Alkaline phosphatase (APISO): 73 U/L (ref 35–144)
BUN: 19 mg/dL (ref 7–25)
CO2: 26 mmol/L (ref 20–32)
Calcium: 8.8 mg/dL (ref 8.6–10.3)
Chloride: 101 mmol/L (ref 98–110)
Creat: 1.08 mg/dL (ref 0.70–1.28)
Globulin: 2.7 g/dL (calc) (ref 1.9–3.7)
Glucose, Bld: 151 mg/dL — ABNORMAL HIGH (ref 65–99)
Potassium: 4.3 mmol/L (ref 3.5–5.3)
Sodium: 141 mmol/L (ref 135–146)
Total Bilirubin: 0.8 mg/dL (ref 0.2–1.2)
Total Protein: 7 g/dL (ref 6.1–8.1)

## 2022-02-13 LAB — CBC WITH DIFFERENTIAL/PLATELET
Absolute Monocytes: 705 cells/uL (ref 200–950)
Basophils Absolute: 49 cells/uL (ref 0–200)
Basophils Relative: 0.6 %
Eosinophils Absolute: 221 cells/uL (ref 15–500)
Eosinophils Relative: 2.7 %
HCT: 37.4 % — ABNORMAL LOW (ref 38.5–50.0)
Hemoglobin: 13.1 g/dL — ABNORMAL LOW (ref 13.2–17.1)
Lymphs Abs: 2501 cells/uL (ref 850–3900)
MCH: 32.7 pg (ref 27.0–33.0)
MCHC: 35 g/dL (ref 32.0–36.0)
MCV: 93.3 fL (ref 80.0–100.0)
MPV: 10.5 fL (ref 7.5–12.5)
Monocytes Relative: 8.6 %
Neutro Abs: 4723 cells/uL (ref 1500–7800)
Neutrophils Relative %: 57.6 %
Platelets: 335 10*3/uL (ref 140–400)
RBC: 4.01 10*6/uL — ABNORMAL LOW (ref 4.20–5.80)
RDW: 13.5 % (ref 11.0–15.0)
Total Lymphocyte: 30.5 %
WBC: 8.2 10*3/uL (ref 3.8–10.8)

## 2022-02-22 ENCOUNTER — Other Ambulatory Visit: Payer: Self-pay

## 2022-02-22 DIAGNOSIS — R7989 Other specified abnormal findings of blood chemistry: Secondary | ICD-10-CM

## 2022-03-21 ENCOUNTER — Other Ambulatory Visit: Payer: Self-pay

## 2022-03-21 DIAGNOSIS — R7989 Other specified abnormal findings of blood chemistry: Secondary | ICD-10-CM

## 2022-04-05 DIAGNOSIS — E782 Mixed hyperlipidemia: Secondary | ICD-10-CM | POA: Diagnosis not present

## 2022-04-05 DIAGNOSIS — E119 Type 2 diabetes mellitus without complications: Secondary | ICD-10-CM | POA: Diagnosis not present

## 2022-04-08 DIAGNOSIS — R7989 Other specified abnormal findings of blood chemistry: Secondary | ICD-10-CM | POA: Diagnosis not present

## 2022-04-08 LAB — CBC WITH DIFFERENTIAL/PLATELET
Absolute Monocytes: 740 cells/uL (ref 200–950)
Basophils Absolute: 70 cells/uL (ref 0–200)
Basophils Relative: 0.8 %
Eosinophils Absolute: 261 cells/uL (ref 15–500)
Eosinophils Relative: 3 %
HCT: 34.6 % — ABNORMAL LOW (ref 38.5–50.0)
Hemoglobin: 12 g/dL — ABNORMAL LOW (ref 13.2–17.1)
Lymphs Abs: 2480 cells/uL (ref 850–3900)
MCH: 32.8 pg (ref 27.0–33.0)
MCHC: 34.7 g/dL (ref 32.0–36.0)
MCV: 94.5 fL (ref 80.0–100.0)
MPV: 10.1 fL (ref 7.5–12.5)
Monocytes Relative: 8.5 %
Neutro Abs: 5150 cells/uL (ref 1500–7800)
Neutrophils Relative %: 59.2 %
Platelets: 324 10*3/uL (ref 140–400)
RBC: 3.66 10*6/uL — ABNORMAL LOW (ref 4.20–5.80)
RDW: 13.2 % (ref 11.0–15.0)
Total Lymphocyte: 28.5 %
WBC: 8.7 10*3/uL (ref 3.8–10.8)

## 2022-04-11 ENCOUNTER — Telehealth: Payer: Self-pay | Admitting: *Deleted

## 2022-04-11 NOTE — Telephone Encounter (Signed)
[facility] for pt to schedule his colonoscopy.

## 2022-04-12 DIAGNOSIS — E1140 Type 2 diabetes mellitus with diabetic neuropathy, unspecified: Secondary | ICD-10-CM | POA: Diagnosis not present

## 2022-04-12 DIAGNOSIS — Z6830 Body mass index (BMI) 30.0-30.9, adult: Secondary | ICD-10-CM | POA: Diagnosis not present

## 2022-04-12 DIAGNOSIS — R600 Localized edema: Secondary | ICD-10-CM | POA: Diagnosis not present

## 2022-04-12 DIAGNOSIS — R251 Tremor, unspecified: Secondary | ICD-10-CM | POA: Diagnosis not present

## 2022-04-12 DIAGNOSIS — K219 Gastro-esophageal reflux disease without esophagitis: Secondary | ICD-10-CM | POA: Diagnosis not present

## 2022-04-12 DIAGNOSIS — E669 Obesity, unspecified: Secondary | ICD-10-CM | POA: Diagnosis not present

## 2022-04-12 DIAGNOSIS — I10 Essential (primary) hypertension: Secondary | ICD-10-CM | POA: Diagnosis not present

## 2022-04-12 DIAGNOSIS — D649 Anemia, unspecified: Secondary | ICD-10-CM | POA: Diagnosis not present

## 2022-04-12 DIAGNOSIS — F419 Anxiety disorder, unspecified: Secondary | ICD-10-CM | POA: Diagnosis not present

## 2022-04-12 DIAGNOSIS — E119 Type 2 diabetes mellitus without complications: Secondary | ICD-10-CM | POA: Diagnosis not present

## 2022-04-12 DIAGNOSIS — E782 Mixed hyperlipidemia: Secondary | ICD-10-CM | POA: Diagnosis not present

## 2022-04-16 NOTE — Telephone Encounter (Signed)
Pt called back. He stated he has spoke with his doctor and he does not want to proceed with scheduling a colonoscopy at this time. FYI to Dr. Abbey Chatters

## 2022-05-02 NOTE — Progress Notes (Signed)
Cardiology Office Note    Date:  05/03/2022   ID:  Douglas Mason, DOB 06-08-1951, MRN [id number]  PCP:  Celene Squibb, MD  Cardiologist: Werner Lean, MD    Chief Complaint  Patient presents with   Follow-up    6 month visit    History of Present Illness:    Douglas Mason is a 71 y.o. male with past medical history of HTN, HLD, Type 2 DM and remote history of atrial flutter in 2015 who presents to the office today for 8-monthfollow-up.   He was last examined by Dr. CGasper Sellsin 10/2021 as a new patient referral for dyspnea on exertion and lower extremity edema. He had been switched from Propranolol to Bystolic and reported improvement in his symptoms and edema had improved since being switched to Lasix '40mg'$  daily. An echocardiogram was recommended for further evaluation and showed a preserved EF of 60-65% with no regional WMA. He did have mild LVH, normal RV function and trivial MR with no significant valve abnormalities.   In talking with the patient and his sister today, he reports overall doing well since his last office visit. He does have difficulty climbing steps due to dyspnea and neuropathy but reports this has overall been unchanged.  No shortness of breath when walking on flat surfaces. No recent chest pain or palpitations. No specific orthopnea, PND or pitting edema. He does experience swelling along the top of his feet when wearing socks but says this improves by the following day. He does not add salt to his food routinely but does eat at local restaurants regularly. Remains on Lasix 40 mg daily and reports good urination with this.   Past Medical History:  Diagnosis Date   Bronchitis    Diabetes mellitus without complication ([facility])    GERD (gastroesophageal reflux disease)    High cholesterol    Hypertension    Pleurisy    Pneumonia    Sleep apnea    nurse in pre-op observed pt sats drop to 80-82% while sleeping on RA,wake up would go to 87%     Past Surgical History:  Procedure Laterality Date   CHOLECYSTECTOMY N/A 01/13/2019   Procedure: LAPAROSCOPIC CHOLECYSTECTOMY;  Surgeon: BVirl Cagey MD;  Location: [facility];  Service: General;  Laterality: N/A;    Current Medications: Outpatient Medications Prior to Visit  Medication Sig Dispense Refill   albuterol (PROVENTIL HFA;VENTOLIN HFA) 108 (90 BASE) MCG/ACT inhaler Inhale 2 puffs into the lungs every 4 (four) hours as needed for wheezing or shortness of breath. 1 Inhaler 1   albuterol (PROVENTIL) (2.5 MG/3ML) 0.083% nebulizer solution Take 3 mLs (2.5 mg total) by nebulization every 6 (six) hours as needed for wheezing or shortness of breath. 75 mL 12   ALPRAZolam (XANAX) 0.5 MG tablet Take 0.5 mg by mouth at bedtime as needed for anxiety.     aspirin EC 81 MG tablet Take 81 mg by mouth daily.     atorvastatin (LIPITOR) 40 MG tablet Take 40 mg by mouth daily.     azaTHIOprine (IMURAN) 50 MG tablet Take 1 tablet (50 mg total) by mouth daily. 30 tablet 11   bisoprolol (ZEBETA) 5 MG tablet Take 1 tablet (5 mg total) by mouth daily. 30 tablet 11   furosemide (LASIX) 40 MG tablet Take 40 mg by mouth daily.     glipiZIDE (GLUCOTROL XL) 10 MG 24 hr tablet Take 1 tablet by mouth 2 (two) times a day.  HYDROcodone-acetaminophen (NORCO/VICODIN) 5-325 MG tablet Take 1 tablet by mouth at bedtime as needed for pain.     JARDIANCE 25 MG TABS tablet Take 25 mg by mouth daily.     losartan (COZAAR) 50 MG tablet Take 50 mg by mouth daily.     metFORMIN (GLUCOPHAGE-XR) 500 MG 24 hr tablet Take 2 tablets (1,000 mg total) by mouth 2 (two) times a day.     Multiple Vitamins-Minerals (MULTIVITAMINS THER. W/MINERALS) TABS tablet Take 1 tablet by mouth daily.     pantoprazole (PROTONIX) 40 MG tablet Take 1 tablet (40 mg total) by mouth 2 (two) times daily before a meal. (Patient taking differently: Take 40 mg by mouth at bedtime.) 60 tablet 0   potassium chloride SA (KLOR-CON M) 20 MEQ tablet Take  20 mEq by mouth daily.     pregabalin (LYRICA) 50 MG capsule Take 50 mg by mouth 3 (three) times daily.     TRESIBA FLEXTOUCH 100 UNIT/ML FlexTouch Pen Inject 10 Units into the skin daily.     No facility-administered medications prior to visit.     Allergies:   Lisinopril   Social History   Socioeconomic History   Marital status: Married    Spouse name: Not on file   Number of children: Not on file   Years of education: Not on file   Highest education level: Not on file  Occupational History    Employer: [employer]    Comment: [employer]  Tobacco Use   Smoking status: Former    Packs/day: 2.00    Years: 30.00    Total pack years: 60.00    Types: Cigarettes    Quit date: 09/02/1976    Years since quitting: 45.6   Smokeless tobacco: Former    Types: Chew    Quit date: 09/03/2003  Vaping Use   Vaping Use: Never used  Substance and Sexual Activity   Alcohol use: No   Drug use: No   Sexual activity: Yes  Other Topics Concern   Not on file  Social History Narrative   Not on file   Social Determinants of Health   Financial Resource Strain: Not on file  Food Insecurity: Not on file  Transportation Needs: Not on file  Physical Activity: Not on file  Stress: Not on file  Social Connections: Not on file     Family History:  The patient's family history includes Asthma in his mother; Leukemia in his mother; Rheum arthritis in his maternal grandmother and mother.   Review of Systems:    Please see the history of present illness.     All other systems reviewed and are otherwise negative except as noted above.   Physical Exam:    VS:  BP 104/74 (BP Location: Left Arm, Patient Position: Sitting, Cuff Size: Large)   Pulse 64   Ht 6' (1.829 m)   Wt 229 lb (103.9 kg)   SpO2 96%   BMI 31.06 kg/m    General: Well developed, well nourished,male appearing in no acute distress. Head: Normocephalic, atraumatic. Neck: No carotid bruits. JVD not elevated.  Lungs: Respirations  regular and unlabored, without wheezes or rales.  Heart: Regular rate and rhythm. No S3 or S4.  No murmur, no rubs, or gallops appreciated. Abdomen: Appears non-distended. No obvious abdominal masses. Msk:  Strength and tone appear normal for age. No obvious joint deformities or effusions. Extremities: No clubbing or cyanosis. Trace ankle edema bilaterally.  Distal pedal pulses are 2+ bilaterally. Neuro: Alert  and oriented X 3. Moves all extremities spontaneously. No focal deficits noted. Psych:  Responds to questions appropriately with a normal affect. Skin: No rashes or lesions noted  Wt Readings from Last 3 Encounters:  05/03/22 229 lb (103.9 kg)  01/16/22 228 lb 12.8 oz (103.8 kg)  10/31/21 234 lb (106.1 kg)     Studies/Labs Reviewed:   EKG:  EKG is not ordered today.  Recent Labs: 09/10/2021: B Natriuretic Peptide 11.3 02/12/2022: ALT 21; BUN 19; Creat 1.08; Potassium 4.3; Sodium 141 04/08/2022: Hemoglobin 12.0; Platelets 324   Lipid Panel    Component Value Date/Time   CHOL 122 03/13/2014 1425   TRIG 68 03/13/2014 1425   HDL 34 (L) 03/13/2014 1425   CHOLHDL 3.6 03/13/2014 1425   VLDL 14 03/13/2014 1425   [place] 74 03/13/2014 1425    Additional studies/ records that were reviewed today include:   Echocardiogram: 10/2021 IMPRESSIONS     1. Left ventricular ejection fraction, by estimation, is 60 to 65%. The  left ventricle has normal function. The left ventricle has no regional  wall motion abnormalities. There is mild left ventricular hypertrophy.  Left ventricular diastolic parameters  were normal.   2. Right ventricular systolic function is normal. The right ventricular  size is mildly enlarged. There is normal pulmonary artery systolic  pressure. The estimated right ventricular systolic pressure is 09.4 mmHg.   3. The mitral valve is normal in structure. Trivial mitral valve  regurgitation. No evidence of mitral stenosis.   4. The aortic valve is tricuspid.  Aortic valve regurgitation is not  visualized. No aortic stenosis is present.   5. The inferior vena cava is normal in size with greater than 50%  respiratory variability, suggesting right atrial pressure of 3 mmHg.   Assessment:    1. Lower extremity edema   2. Coronary artery calcification seen on CT scan   3. Essential hypertension   4. Hyperlipidemia LDL goal <70      Plan:   In order of problems listed above:  1. Lower Extremity Edema - Prior echocardiogram earlier this year was reassuring and showed a preserved EF of 60 to 65% with mild LVH. His weight has been stable and symptoms did improve after initiation of Lasix. Remains on Lasix 40 mg daily along with Jardiance '25mg'$  daily (by his PCP). Creatinine was stable at 1.08 by labs in 01/2022. He did have more recent labs with his PCP in the interim and we will request a copy of these. We did discuss the importance of continuing to limit sodium intake, especially in the setting of eating at restaurants routinely.  2.  Coronary Calcification on CT/Dyspnea on exertion - His dyspnea improved after being switched from Propanolol to Bisoprolol and also being started on a diuretic. I encouraged him to make [place] aware if he develops worsening dyspnea or associated chest discomfort as a repeat Lexiscan Myoview could be arranged. - Continue ASA and statin therapy.   3. HTN - Blood pressure is well controlled at 104/74 during today's visit. Continue current medical therapy with Bisoprolol 5 mg daily, Lasix 40 mg daily and Losartan 50 mg daily.  4. HLD - Followed by his PCP. He remains on Atorvastatin 40 mg daily with goal LDL less than 70.   Medication Adjustments/Labs and Tests Ordered: Current medicines are reviewed at length with the patient today.  Concerns regarding medicines are outlined above.  Medication changes, Labs and Tests ordered today are listed in the Patient Instructions below.  Patient Instructions  Medication  Instructions:  Your physician recommends that you continue on your current medications as directed. Please refer to the Current Medication list given to you today.   Labwork: none  Testing/Procedures: none  Follow-Up:  Your physician recommends that you schedule a follow-up appointment in: 6 month follow with [place] or Dr. Gasper Sells.  Any Other Special Instructions Will Be Listed Below (If Applicable).  If you need a refill on your cardiac medications before your next appointment, please call your pharmacy.    Signed, Erma Heritage, PA-C  05/03/2022 3:01 PM    [place] [facility] [address] [place], [place] [address] Phone: [phone] Fax: [phone]

## 2022-05-03 ENCOUNTER — Encounter: Payer: Self-pay | Admitting: Student

## 2022-05-03 ENCOUNTER — Ambulatory Visit: Payer: Medicare Other | Attending: Student | Admitting: Student

## 2022-05-03 ENCOUNTER — Encounter: Payer: Self-pay | Admitting: Internal Medicine

## 2022-05-03 VITALS — BP 104/74 | HR 64 | Ht 72.0 in | Wt 229.0 lb

## 2022-05-03 DIAGNOSIS — R600 Localized edema: Secondary | ICD-10-CM | POA: Diagnosis not present

## 2022-05-03 DIAGNOSIS — E785 Hyperlipidemia, unspecified: Secondary | ICD-10-CM | POA: Insufficient documentation

## 2022-05-03 DIAGNOSIS — I10 Essential (primary) hypertension: Secondary | ICD-10-CM | POA: Diagnosis not present

## 2022-05-03 DIAGNOSIS — I2510 Atherosclerotic heart disease of native coronary artery without angina pectoris: Secondary | ICD-10-CM | POA: Diagnosis not present

## 2022-05-03 NOTE — Patient Instructions (Addendum)
Medication Instructions:  Your physician recommends that you continue on your current medications as directed. Please refer to the Current Medication list given to you today.   Labwork: none  Testing/Procedures: none  Follow-Up:  Your physician recommends that you schedule a follow-up appointment in: 6 month follow with [place] or Dr. Gasper Sells.  Any Other Special Instructions Will Be Listed Below (If Applicable).  If you need a refill on your cardiac medications before your next appointment, please call your pharmacy.

## 2022-07-03 ENCOUNTER — Encounter: Payer: Self-pay | Admitting: *Deleted

## 2022-08-07 DIAGNOSIS — E782 Mixed hyperlipidemia: Secondary | ICD-10-CM | POA: Diagnosis not present

## 2022-08-07 DIAGNOSIS — E119 Type 2 diabetes mellitus without complications: Secondary | ICD-10-CM | POA: Diagnosis not present

## 2022-08-13 DIAGNOSIS — D649 Anemia, unspecified: Secondary | ICD-10-CM | POA: Diagnosis not present

## 2022-08-13 DIAGNOSIS — E669 Obesity, unspecified: Secondary | ICD-10-CM | POA: Diagnosis not present

## 2022-08-13 DIAGNOSIS — I10 Essential (primary) hypertension: Secondary | ICD-10-CM | POA: Diagnosis not present

## 2022-08-13 DIAGNOSIS — R251 Tremor, unspecified: Secondary | ICD-10-CM | POA: Diagnosis not present

## 2022-08-13 DIAGNOSIS — R600 Localized edema: Secondary | ICD-10-CM | POA: Diagnosis not present

## 2022-08-13 DIAGNOSIS — R809 Proteinuria, unspecified: Secondary | ICD-10-CM | POA: Diagnosis not present

## 2022-08-13 DIAGNOSIS — F419 Anxiety disorder, unspecified: Secondary | ICD-10-CM | POA: Diagnosis not present

## 2022-08-13 DIAGNOSIS — K219 Gastro-esophageal reflux disease without esophagitis: Secondary | ICD-10-CM | POA: Diagnosis not present

## 2022-08-13 DIAGNOSIS — Z23 Encounter for immunization: Secondary | ICD-10-CM | POA: Diagnosis not present

## 2022-08-13 DIAGNOSIS — E782 Mixed hyperlipidemia: Secondary | ICD-10-CM | POA: Diagnosis not present

## 2022-08-13 DIAGNOSIS — E1140 Type 2 diabetes mellitus with diabetic neuropathy, unspecified: Secondary | ICD-10-CM | POA: Diagnosis not present

## 2022-08-13 DIAGNOSIS — E1169 Type 2 diabetes mellitus with other specified complication: Secondary | ICD-10-CM | POA: Diagnosis not present

## 2022-08-20 ENCOUNTER — Encounter: Payer: Self-pay | Admitting: Internal Medicine

## 2022-09-03 ENCOUNTER — Other Ambulatory Visit: Payer: Self-pay | Admitting: Internal Medicine

## 2022-09-05 ENCOUNTER — Other Ambulatory Visit: Payer: Self-pay | Admitting: Internal Medicine

## 2022-09-05 ENCOUNTER — Ambulatory Visit: Payer: Medicare Other | Admitting: Internal Medicine

## 2022-09-05 DIAGNOSIS — K754 Autoimmune hepatitis: Secondary | ICD-10-CM | POA: Diagnosis not present

## 2022-09-05 DIAGNOSIS — R945 Abnormal results of liver function studies: Secondary | ICD-10-CM | POA: Diagnosis not present

## 2022-09-05 DIAGNOSIS — R932 Abnormal findings on diagnostic imaging of liver and biliary tract: Secondary | ICD-10-CM | POA: Diagnosis not present

## 2022-09-05 DIAGNOSIS — K759 Inflammatory liver disease, unspecified: Secondary | ICD-10-CM | POA: Diagnosis not present

## 2022-09-05 LAB — CBC WITH DIFFERENTIAL/PLATELET
Absolute Monocytes: 655 cells/uL (ref 200–950)
Basophils Absolute: 62 cells/uL (ref 0–200)
Basophils Relative: 0.8 %
Eosinophils Absolute: 262 cells/uL (ref 15–500)
Eosinophils Relative: 3.4 %
HCT: 33.4 % — ABNORMAL LOW (ref 38.5–50.0)
Hemoglobin: 11.6 g/dL — ABNORMAL LOW (ref 13.2–17.1)
Lymphs Abs: 1748 cells/uL (ref 850–3900)
MCH: 32.1 pg (ref 27.0–33.0)
MCHC: 34.7 g/dL (ref 32.0–36.0)
MCV: 92.5 fL (ref 80.0–100.0)
MPV: 10.6 fL (ref 7.5–12.5)
Monocytes Relative: 8.5 %
Neutro Abs: 4974 cells/uL (ref 1500–7800)
Neutrophils Relative %: 64.6 %
Platelets: 299 10*3/uL (ref 140–400)
RBC: 3.61 10*6/uL — ABNORMAL LOW (ref 4.20–5.80)
RDW: 13.2 % (ref 11.0–15.0)
Total Lymphocyte: 22.7 %
WBC: 7.7 10*3/uL (ref 3.8–10.8)

## 2022-09-05 IMAGING — CR DG CHEST 2V
2 series · 2 of 2 positions shown · non-contrast
Comparison: Chest radiograph dated 08/23/2021.

CLINICAL DATA: Shortness of breath.

EXAM:
CHEST - 2 VIEW

[chest pa]
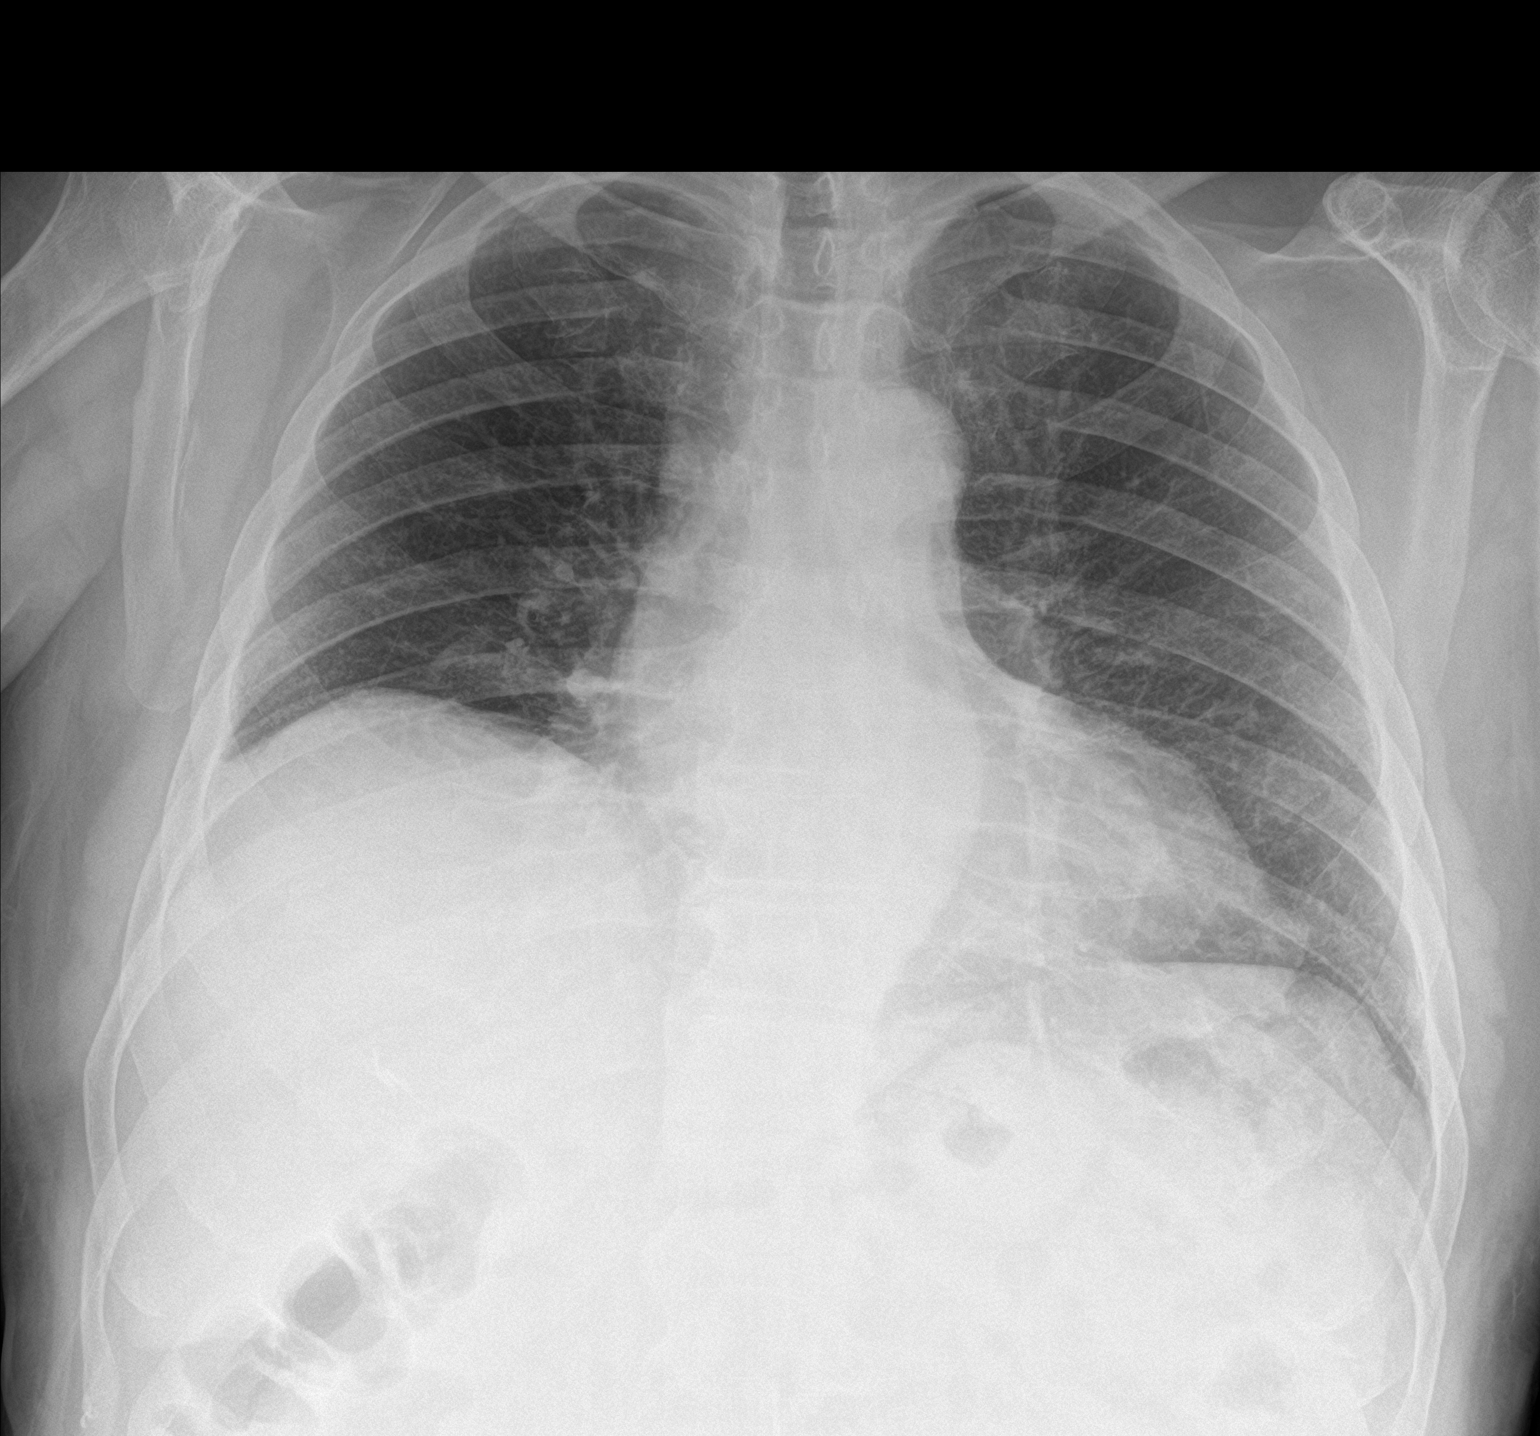

[chest lat]
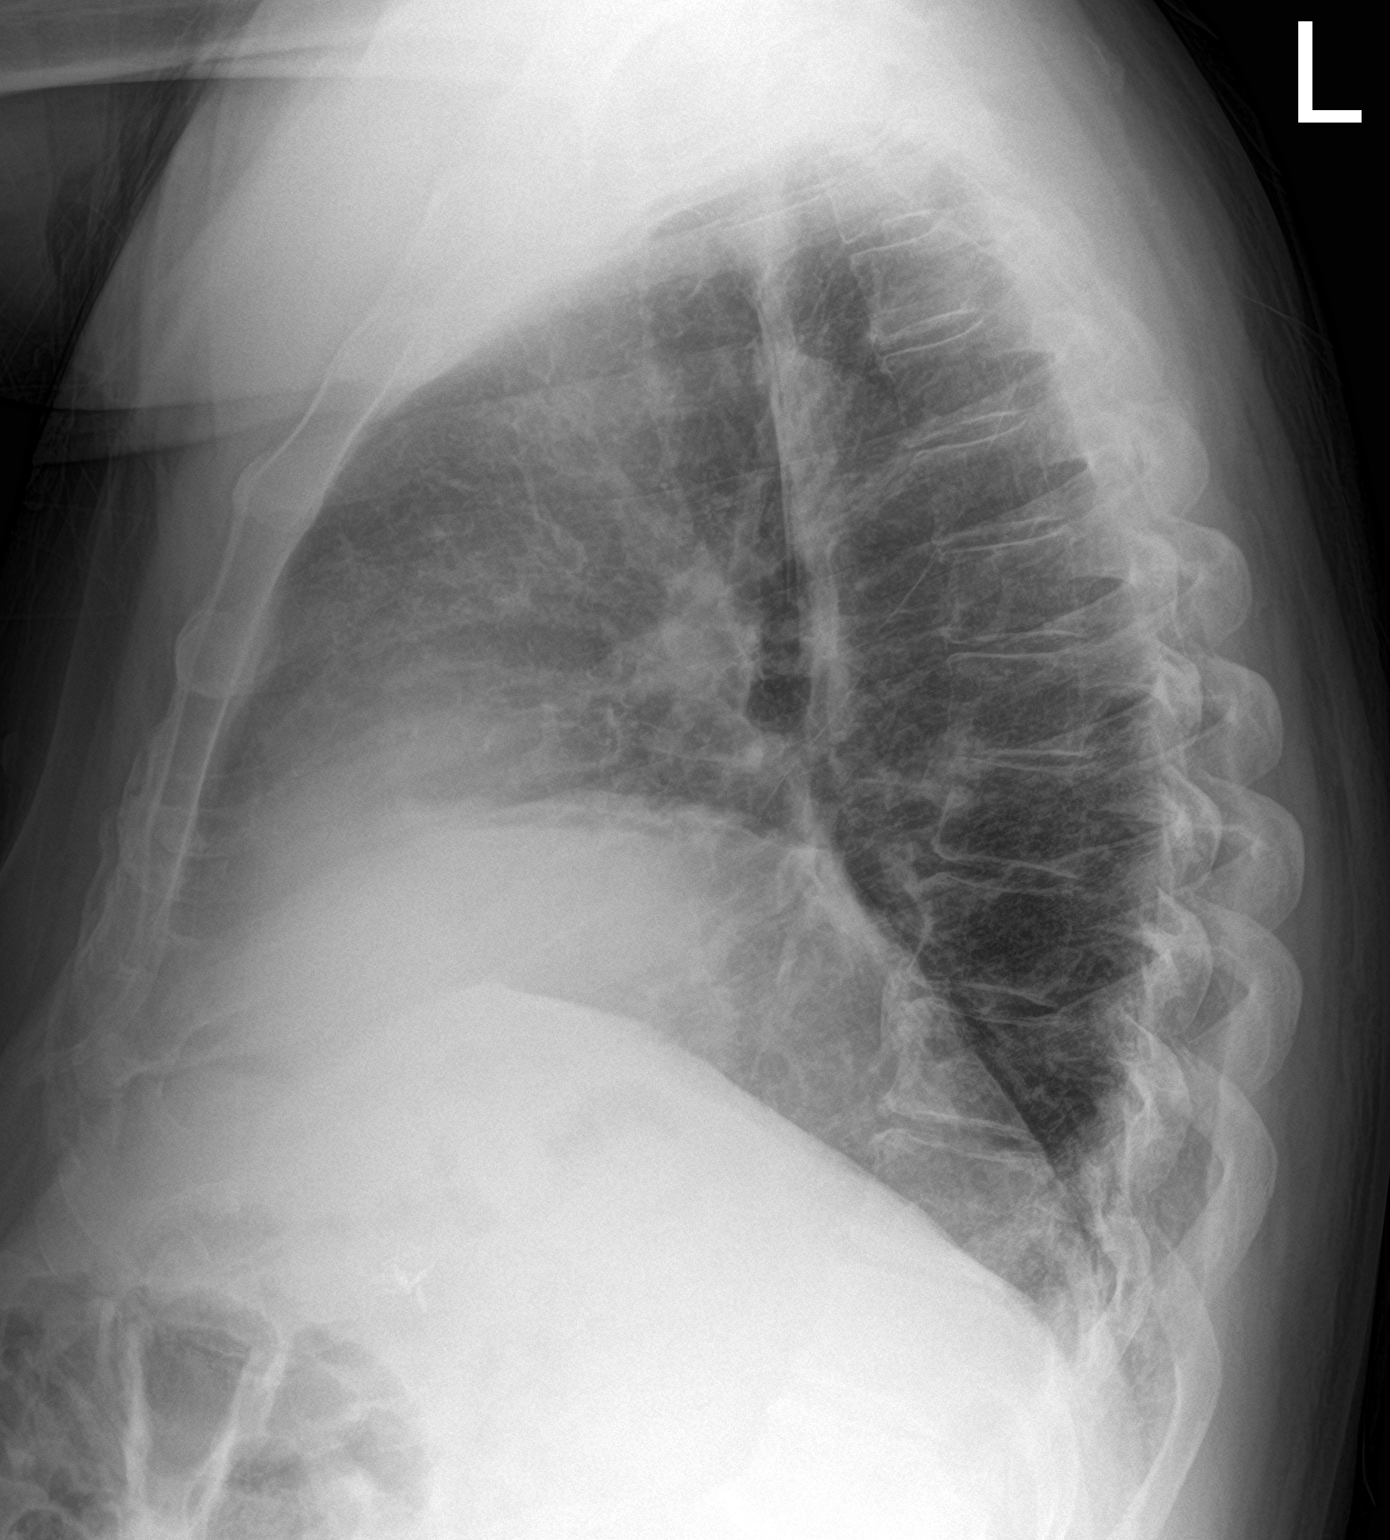

[2 of 2 positions shown; findings below may reference images not displayed]

FINDINGS: Mild eventration of the right hemidiaphragm. No focal consolidation,
pleural effusion, pneumothorax. Stable cardiac silhouette. No acute
osseous pathology.
IMPRESSION: No active cardiopulmonary disease.

## 2022-09-12 ENCOUNTER — Ambulatory Visit (INDEPENDENT_AMBULATORY_CARE_PROVIDER_SITE_OTHER): Payer: Medicare Other | Admitting: Internal Medicine

## 2022-09-12 ENCOUNTER — Telehealth: Payer: Self-pay | Admitting: *Deleted

## 2022-09-12 ENCOUNTER — Encounter: Payer: Self-pay | Admitting: Internal Medicine

## 2022-09-12 VITALS — BP 146/81 | HR 69 | Temp 97.2°F | Ht 72.0 in | Wt 239.4 lb

## 2022-09-12 DIAGNOSIS — K754 Autoimmune hepatitis: Secondary | ICD-10-CM

## 2022-09-12 DIAGNOSIS — K7460 Unspecified cirrhosis of liver: Secondary | ICD-10-CM

## 2022-09-12 DIAGNOSIS — R932 Abnormal findings on diagnostic imaging of liver and biliary tract: Secondary | ICD-10-CM | POA: Diagnosis not present

## 2022-09-12 DIAGNOSIS — K219 Gastro-esophageal reflux disease without esophagitis: Secondary | ICD-10-CM | POA: Diagnosis not present

## 2022-09-12 NOTE — Telephone Encounter (Signed)
[facility]  [place] RUQ w/elastography scheduled for 09/23/22 at 8:30 am, arrive at 8 am, nothing to eat or drink after midnight.

## 2022-09-12 NOTE — Progress Notes (Signed)
Referring Provider: Celene Squibb, MD Primary Care Physician:  Celene Squibb, MD Primary GI:  Dr. Abbey Chatters  Chief Complaint  Patient presents with   Follow-up    Pt here for 6 month follow up    HPI:   Douglas Mason is a 72 y.o. male  who presents to the clinic today for follow-up visit.  Previously referred for abnormal LFTs.  Subsequent work-up as follows:   Ultrasound with elastography 10/04/2020 showed coarsened hepatic echotexture with subtle contour nodularity suspicious for early mild hepatic cirrhosis, and median kPA 8.1.   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     Subsequent liver biopsy 10/12/2020 with patchy but prominent portal and lobular inflammation composed of lymphocytes and plasma cells with accompanying moderate to focally severe interface hepatitis and associated periportal hepatocyte necrosis.  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  Also noted patchy advanced fibrosis   On 10/20/2020, patient was placed on prednisone 60 mg daily for 1 week then 40 mg daily for 1 week then 30 mg daily for 1 week and has been on 20 mg daily since that time.     LFTs 11/13/2020 showed normalization of his AST 27, ALT 46, T bili 0.9 alk phos 49.  Patient ran out of prednisone for a few weeks and did not call our office.  I restarted him on prednisone 20 mg daily and LFTs 12/21/2020 showed normalization.  Continue PT status was checked, normal. Was then started on Azathioprine April 2022 and subsequently weaned completely off of prednisone.   No complaints for me today.  Currently doing well.  Continues azathioprine 50 mg daily.  Has had normal LFTs for over a year now.  Past Medical History:  Diagnosis Date   Bronchitis    Diabetes mellitus without complication (HCC)    GERD (gastroesophageal reflux disease)    High cholesterol    Hypertension    Pleurisy    Pneumonia    Sleep apnea    nurse in pre-op observed pt sats drop to 80-82% while sleeping on  RA,wake up would go to 87%    Past Surgical History:  Procedure Laterality Date   CHOLECYSTECTOMY N/A 01/13/2019   Procedure: LAPAROSCOPIC CHOLECYSTECTOMY;  Surgeon: Virl Cagey, MD;  Location: [facility];  Service: General;  Laterality: N/A;    Current Outpatient Medications  Medication Sig Dispense Refill   albuterol (PROVENTIL HFA;VENTOLIN HFA) 108 (90 BASE) MCG/ACT inhaler Inhale 2 puffs into the lungs every 4 (four) hours as needed for wheezing or shortness of breath. 1 Inhaler 1   albuterol (PROVENTIL) (2.5 MG/3ML) 0.083% nebulizer solution Take 3 mLs (2.5 mg total) by nebulization every 6 (six) hours as needed for wheezing or shortness of breath. 75 mL 12   ALPRAZolam (XANAX) 0.5 MG tablet Take 0.5 mg by mouth at bedtime as needed for anxiety.     aspirin EC 81 MG tablet Take 81 mg by mouth daily.     atorvastatin (LIPITOR) 40 MG tablet Take 40 mg by mouth daily.     azaTHIOprine (IMURAN) 50 MG tablet Take 1 tablet (50 mg total) by mouth daily. 30 tablet 11   bisoprolol (ZEBETA) 5 MG tablet TAKE ONE TABLET BY MOUTH ONCE DAILY 30 tablet 0   furosemide (LASIX) 40 MG tablet Take 40 mg by mouth daily.     glipiZIDE (GLUCOTROL XL) 10 MG 24 hr tablet Take 1 tablet by mouth 2 (two) times a day.  HYDROcodone-acetaminophen (NORCO/VICODIN) 5-325 MG tablet Take 1 tablet by mouth at bedtime as needed for pain.     JARDIANCE 25 MG TABS tablet Take 25 mg by mouth daily.     losartan (COZAAR) 50 MG tablet Take 50 mg by mouth daily.     metFORMIN (GLUCOPHAGE-XR) 500 MG 24 hr tablet Take 2 tablets (1,000 mg total) by mouth 2 (two) times a day.     Multiple Vitamins-Minerals (MULTIVITAMINS THER. W/MINERALS) TABS tablet Take 1 tablet by mouth daily.     pantoprazole (PROTONIX) 40 MG tablet Take 1 tablet (40 mg total) by mouth 2 (two) times daily before a meal. (Patient taking differently: Take 40 mg by mouth at bedtime.) 60 tablet 0   potassium chloride SA (KLOR-CON M) 20 MEQ tablet Take 20 mEq by  mouth daily.     pregabalin (LYRICA) 50 MG capsule Take 50 mg by mouth 3 (three) times daily.     TRESIBA FLEXTOUCH 100 UNIT/ML FlexTouch Pen Inject 10 Units into the skin daily.     No current facility-administered medications for this visit.    Allergies as of 09/12/2022 - Review Complete 09/12/2022  Allergen Reaction Noted   Lisinopril Cough 10/09/2020    Family History  Problem Relation Age of Onset   Asthma Mother    Leukemia Mother    Rheum arthritis Mother    Rheum arthritis Maternal Grandmother     Social History   Socioeconomic History   Marital status: Married    Spouse name: Not on file   Number of children: Not on file   Years of education: Not on file   Highest education level: Not on file  Occupational History    Employer: [employer]    Comment: [employer]  Tobacco Use   Smoking status: Former    Packs/day: 2.00    Years: 30.00    Total pack years: 60.00    Types: Cigarettes    Quit date: 09/02/1976    Years since quitting: 46.0   Smokeless tobacco: Former    Types: Chew    Quit date: 09/03/2003  Vaping Use   Vaping Use: Never used  Substance and Sexual Activity   Alcohol use: No   Drug use: No   Sexual activity: Yes  Other Topics Concern   Not on file  Social History Narrative   Not on file   Social Determinants of Health   Financial Resource Strain: Not on file  Food Insecurity: Not on file  Transportation Needs: Not on file  Physical Activity: Not on file  Stress: Not on file  Social Connections: Not on file    Subjective: Review of Systems  Constitutional:  Negative for chills and fever.  HENT:  Negative for congestion and hearing loss.   Eyes:  Negative for blurred vision and double vision.  Respiratory:  Negative for cough and shortness of breath.   Cardiovascular:  Negative for chest pain and palpitations.  Gastrointestinal:  Negative for abdominal pain, blood in stool, constipation, diarrhea, heartburn, melena and vomiting.   Genitourinary:  Negative for dysuria and urgency.  Musculoskeletal:  Negative for joint pain and myalgias.  Skin:  Negative for itching and rash.  Neurological:  Negative for dizziness and headaches.  Psychiatric/Behavioral:  Negative for depression. The patient is not nervous/anxious.      Objective: BP (!) 146/81   Pulse 69   Temp (!) 97.2 F (36.2 C)   Ht 6' (1.829 m)   Wt 239 lb 6.4 oz (  108.6 kg)   BMI 32.47 kg/m  Physical Exam Constitutional:      Appearance: Normal appearance.  HENT:     Head: Normocephalic and atraumatic.  Eyes:     Extraocular Movements: Extraocular movements intact.     Conjunctiva/sclera: Conjunctivae normal.  Cardiovascular:     Rate and Rhythm: Normal rate and regular rhythm.  Pulmonary:     Effort: Pulmonary effort is normal.     Breath sounds: Normal breath sounds.  Abdominal:     General: Bowel sounds are normal.     Palpations: Abdomen is soft.  Musculoskeletal:        General: Normal range of motion.     Cervical back: Normal range of motion and neck supple.  Skin:    General: Skin is warm.  Neurological:     General: No focal deficit present.     Mental Status: He is alert and oriented to person, place, and time.  Psychiatric:        Mood and Affect: Mood normal.        Behavior: Behavior normal.      Assessment: *Autoimmune hepatitis-remission *Chronic GERD-well-controlled on pantoprazole *Colon cancer screening  Plan: Discussed autoimmune hepatitis in depth with patient today.  Discussed long-term implications, risk of relapse, chronic therapy.  TPMT status normal.  LFTs, IgG, and GGT have normalized.   Tolerating azathioprine without any issues.  Continue on 50 mg daily.  Refilled today.  Recheck blood work today.    Ultrasound with elastography ordered today.  GERD well-controlled on pantoprazole.  We will continue.  Patient reports negative Cologuard last year.  Continue every 3 years.  Follow-up in 6 months  or sooner if needed.  09/12/2022 11:18 AM   Disclaimer: This note was dictated with voice recognition software. Similar sounding words can inadvertently be transcribed and may not be corrected upon review.

## 2022-09-12 NOTE — Patient Instructions (Signed)
I am going to order an ultrasound of your liver today.  I am also going to order blood work to be done at [facility].  We will call with these results.  Continue on azathioprine.  Continue on pantoprazole for your chronic reflux.  Follow-up in 6 months or sooner if needed.  Dr. Abbey Chatters

## 2022-09-23 ENCOUNTER — Ambulatory Visit (HOSPITAL_COMMUNITY): Payer: Medicare Other | Attending: Internal Medicine

## 2022-10-03 ENCOUNTER — Other Ambulatory Visit: Payer: Self-pay | Admitting: Internal Medicine

## 2022-11-07 ENCOUNTER — Ambulatory Visit: Payer: Medicare Other | Admitting: Nurse Practitioner

## 2022-11-12 ENCOUNTER — Ambulatory Visit (INDEPENDENT_AMBULATORY_CARE_PROVIDER_SITE_OTHER): Payer: Medicare Other | Admitting: Student

## 2022-11-12 ENCOUNTER — Encounter: Payer: Self-pay | Admitting: Student

## 2022-11-12 ENCOUNTER — Other Ambulatory Visit (HOSPITAL_COMMUNITY)
Admission: RE | Admit: 2022-11-12 | Discharge: 2022-11-12 | Disposition: A | Discharge status: Home / Self Care | Payer: Medicare Other | Source: Ambulatory Visit | Attending: Student | Admitting: Student

## 2022-11-12 ENCOUNTER — Telehealth: Payer: Self-pay

## 2022-11-12 VITALS — BP 132/74 | HR 97 | Ht 72.0 in | Wt 235.0 lb

## 2022-11-12 DIAGNOSIS — R0601 Orthopnea: Secondary | ICD-10-CM

## 2022-11-12 DIAGNOSIS — I10 Essential (primary) hypertension: Secondary | ICD-10-CM | POA: Diagnosis not present

## 2022-11-12 DIAGNOSIS — I491 Atrial premature depolarization: Secondary | ICD-10-CM | POA: Insufficient documentation

## 2022-11-12 DIAGNOSIS — E785 Hyperlipidemia, unspecified: Secondary | ICD-10-CM | POA: Diagnosis not present

## 2022-11-12 DIAGNOSIS — I2510 Atherosclerotic heart disease of native coronary artery without angina pectoris: Secondary | ICD-10-CM | POA: Diagnosis not present

## 2022-11-12 LAB — BASIC METABOLIC PANEL
Anion gap: 11 (ref 5–15)
BUN: 17 mg/dL (ref 8–23)
CO2: 27 mmol/L (ref 22–32)
Calcium: 8.9 mg/dL (ref 8.9–10.3)
Chloride: 101 mmol/L (ref 98–111)
Creatinine, Ser: 1.06 mg/dL (ref 0.61–1.24)
GFR, Estimated: >60 mL/min (ref >60)
Glucose, Bld: 169 mg/dL — ABNORMAL HIGH (ref 70–99)
Potassium: 4.5 mmol/L (ref 3.5–5.1)
Sodium: 139 mmol/L (ref 135–145)

## 2022-11-12 LAB — MAGNESIUM: Magnesium: 1.3 mg/dL — ABNORMAL LOW (ref 1.7–2.4)

## 2022-11-12 LAB — BRAIN NATRIURETIC PEPTIDE: B Natriuretic Peptide: 17 pg/mL (ref 0.0–100.0)

## 2022-11-12 MED ORDER — MAGNESIUM OXIDE 400 MG PO CAPS: 400.0000 mg | ORAL_CAPSULE | Freq: Every day | ORAL | 3 refills | Status: DC

## 2022-11-12 NOTE — Patient Instructions (Signed)
Medication Instructions:  Your physician recommends that you continue on your current medications as directed. Please refer to the Current Medication list given to you today.   Labwork: BNP BMP MAG  Testing/Procedures: None  Follow-Up: Follow up with Dr. Dellia Cloud or Bernerd Pho, PA-C in 6 months.   Any Other Special Instructions Will Be Listed Below (If Applicable).     If you need a refill on your cardiac medications before your next appointment, please call your pharmacy.

## 2022-11-12 NOTE — Telephone Encounter (Signed)
-----   Message from Erma Heritage, [place] sent at 11/12/2022  3:11 PM EDT ----- Please let the patient know that his fluid level is normal. Kidney function, sodium and potassium are within a normal range but magnesium is low at 1.3 which could be contributing to his extra beats and also leg cramps. Would recommend starting magnesium oxide 400 mg daily. Can recheck Mg when he sees his PCP in a few weeks or we can order. Please forward a copy of results to Celene Squibb, MD.

## 2022-11-12 NOTE — Progress Notes (Signed)
Cardiology Office Note    Date:  11/12/2022   ID:  Douglas Mason, DOB 11/22/1950, MRN [id number]  PCP:  Celene Squibb, MD  Cardiologist: Werner Lean, MD    Chief Complaint  Patient presents with   Follow-up    6 month visit    History of Present Illness:    Douglas Mason is a 72 y.o. male with past medical history of HTN, HLD, Type 2 DM and remote history of atrial flutter in 2015 who presents for 4-monthfollow-up.  He was last examined by myself in 05/2022 and denied any recent anginal symptoms at that time. He did report his lower extremity edema had been well-controlled with Lasix 40 mg daily and was continued on his current cardiac medications.   In talking with the patient and his sister today, he reports overall doing well since his last office visit. He does have baseline dyspnea on exertion but denies any acute changes in this and uses his inhalers with improvement in symptoms. He does report intermittent dyspnea at night which seems most consistent with orthopnea but denies any PND, abdominal distention or lower extremity edema. Reports his weight has overall been stable and reports good compliance with Lasix 40 mg daily. He denies any specific palpitations or exertional chest pain. Does experience occasional discomfort along his left shoulder which resolves with positional changes.  Past Medical History:  Diagnosis Date   Bronchitis    Diabetes mellitus without complication (HCC)    GERD (gastroesophageal reflux disease)    High cholesterol    Hypertension    Pleurisy    Pneumonia    Sleep apnea    nurse in pre-op observed pt sats drop to 80-82% while sleeping on RA,wake up would go to 87%    Past Surgical History:  Procedure Laterality Date   CHOLECYSTECTOMY N/A 01/13/2019   Procedure: LAPAROSCOPIC CHOLECYSTECTOMY;  Surgeon: BVirl Cagey MD;  Location: [facility];  Service: General;  Laterality: N/A;    Current Medications: Outpatient  Medications Prior to Visit  Medication Sig Dispense Refill   albuterol (PROVENTIL HFA;VENTOLIN HFA) 108 (90 BASE) MCG/ACT inhaler Inhale 2 puffs into the lungs every 4 (four) hours as needed for wheezing or shortness of breath. 1 Inhaler 1   albuterol (PROVENTIL) (2.5 MG/3ML) 0.083% nebulizer solution Take 3 mLs (2.5 mg total) by nebulization every 6 (six) hours as needed for wheezing or shortness of breath. 75 mL 12   ALPRAZolam (XANAX) 0.5 MG tablet Take 0.5 mg by mouth at bedtime as needed for anxiety.     aspirin EC 81 MG tablet Take 81 mg by mouth daily.     atorvastatin (LIPITOR) 40 MG tablet Take 40 mg by mouth daily.     azaTHIOprine (IMURAN) 50 MG tablet Take 1 tablet (50 mg total) by mouth daily. 30 tablet 11   bisoprolol (ZEBETA) 5 MG tablet TAKE ONE TABLET BY MOUTH ONCE DAILY 30 tablet 0   furosemide (LASIX) 40 MG tablet Take 40 mg by mouth daily.     glipiZIDE (GLUCOTROL XL) 10 MG 24 hr tablet Take 1 tablet by mouth 2 (two) times a day.     HYDROcodone-acetaminophen (NORCO/VICODIN) 5-325 MG tablet Take 1 tablet by mouth at bedtime as needed for pain.     JARDIANCE 25 MG TABS tablet Take 25 mg by mouth daily.     losartan (COZAAR) 50 MG tablet Take 50 mg by mouth daily.     metFORMIN (GLUCOPHAGE-XR) 500  MG 24 hr tablet Take 2 tablets (1,000 mg total) by mouth 2 (two) times a day.     Multiple Vitamins-Minerals (MULTIVITAMINS THER. W/MINERALS) TABS tablet Take 1 tablet by mouth daily.     pantoprazole (PROTONIX) 40 MG tablet Take 1 tablet (40 mg total) by mouth 2 (two) times daily before a meal. (Patient taking differently: Take 40 mg by mouth at bedtime.) 60 tablet 0   potassium chloride SA (KLOR-CON M) 20 MEQ tablet Take 20 mEq by mouth daily.     pregabalin (LYRICA) 50 MG capsule Take 50 mg by mouth 3 (three) times daily.     TRESIBA FLEXTOUCH 100 UNIT/ML FlexTouch Pen Inject 10 Units into the skin daily.     No facility-administered medications prior to visit.     Allergies:    Lisinopril   Social History   Socioeconomic History   Marital status: Married    Spouse name: Not on file   Number of children: Not on file   Years of education: Not on file   Highest education level: Not on file  Occupational History    Employer: [employer]    Comment: [employer]  Tobacco Use   Smoking status: Former    Packs/day: 2.00    Years: 30.00    Total pack years: 60.00    Types: Cigarettes    Quit date: 09/02/1976    Years since quitting: 46.2   Smokeless tobacco: Former    Types: Chew    Quit date: 09/03/2003  Vaping Use   Vaping Use: Never used  Substance and Sexual Activity   Alcohol use: No   Drug use: No   Sexual activity: Yes  Other Topics Concern   Not on file  Social History Narrative   Not on file   Social Determinants of Health   Financial Resource Strain: Not on file  Food Insecurity: Not on file  Transportation Needs: Not on file  Physical Activity: Not on file  Stress: Not on file  Social Connections: Not on file     Family History:  The patient's family history includes Asthma in his mother; Leukemia in his mother; Rheum arthritis in his maternal grandmother and mother.   Review of Systems:    Please see the history of present illness.     All other systems reviewed and are otherwise negative except as noted above.   Physical Exam:    VS:  BP 132/74   Pulse 97   Ht 6' (1.829 m)   Wt 235 lb (106.6 kg)   SpO2 97%   BMI 31.87 kg/m    General: Pleasant male appearing in no acute distress. Head: Normocephalic, atraumatic. Neck: No carotid bruits. JVD not elevated.  Lungs: Respirations regular and unlabored, without wheezes or rales.  Heart: Regular rate and rhythm with occasional ectopic beats. No S3 or S4.  No murmur, no rubs, or gallops appreciated. Abdomen: Appears non-distended. No obvious abdominal masses. Msk:  Strength and tone appear normal for age. No obvious joint deformities or effusions. Extremities: No clubbing or cyanosis.  No pitting edema.  Distal pedal pulses are 2+ bilaterally. Neuro: Alert and oriented X 3. Moves all extremities spontaneously. No focal deficits noted. Psych:  Responds to questions appropriately with a normal affect. Skin: No rashes or lesions noted  Wt Readings from Last 3 Encounters:  11/12/22 235 lb (106.6 kg)  09/12/22 239 lb 6.4 oz (108.6 kg)  05/03/22 229 lb (103.9 kg)     Studies/Labs Reviewed:  EKG:  EKG is ordered today.  The ekg ordered today demonstrates normal sinus rhythm, heart rate 97 with PAC's. No acute ST changes.  Recent Labs: 02/12/2022: ALT 21; BUN 19; Creat 1.08; Potassium 4.3; Sodium 141 09/05/2022: Hemoglobin 11.6; Platelets 299   Lipid Panel    Component Value Date/Time   CHOL 122 03/13/2014 1425   TRIG 68 03/13/2014 1425   HDL 34 (L) 03/13/2014 1425   CHOLHDL 3.6 03/13/2014 1425   VLDL 14 03/13/2014 1425   [place] 74 03/13/2014 1425    Additional studies/ records that were reviewed today include:   Echo: 10/2021 IMPRESSIONS     1. Left ventricular ejection fraction, by estimation, is 60 to 65%. The  left ventricle has normal function. The left ventricle has no regional  wall motion abnormalities. There is mild left ventricular hypertrophy.  Left ventricular diastolic parameters  were normal.   2. Right ventricular systolic function is normal. The right ventricular  size is mildly enlarged. There is normal pulmonary artery systolic  pressure. The estimated right ventricular systolic pressure is [id number] mmHg.   3. The mitral valve is normal in structure. Trivial mitral valve  regurgitation. No evidence of mitral stenosis.   4. The aortic valve is tricuspid. Aortic valve regurgitation is not  visualized. No aortic stenosis is present.   5. The inferior vena cava is normal in size with greater than 50%  respiratory variability, suggesting right atrial pressure of 3 mmHg.    Assessment:    1. Orthopnea   2. PAC (premature atrial contraction)    3. Coronary artery calcification seen on CT scan   4. Essential hypertension   5. Hyperlipidemia LDL goal <70      Plan:   In order of problems listed above:  1. Lower Extremity Edema/Orthopnea - As discussed above, intermittent dyspnea seems concerning for orthopnea but he does not appear volume overloaded on examination today and weight has been stable. Possibly secondary to known asthma. Will check a BNP. Continue Lasix 40 mg daily for now along with K-Dur 20 mEq daily.  2. PAC's - Noted on EKG today but he denies any associated symptoms. Will continue Bisoprolol 5 mg daily but can titrate in the future if needed. Will check a BMET and Mg.   3. Coronary Calcification on CT - He had a low-risk NST in 2015 and echocardiogram last year showed a preserved EF with no wall motion abnormalities. His left shoulder discomfort seems most consistent with musculoskeletal pain given it is associated with positional changes. If he developed more concerning symptoms, would recommend a repeat Lexiscan Myoview in the future. - Continue ASA '81mg'$  daily, Atorvastatin '40mg'$  daily and Bisoprolol '5mg'$  daily.   4. HTN - BP was initially elevated at 152/80, rechecked and improved to 132/74. Will continue current medical therapy with Bisoprolol 5 mg daily, Lasix 40 mg daily and Losartan 50 mg daily.  5. HLD - LDL was at 66 in 08/2022. Will continue with Atorvastatin '40mg'$  daily.      Medication Adjustments/Labs and Tests Ordered: Current medicines are reviewed at length with the patient today.  Concerns regarding medicines are outlined above.  Medication changes, Labs and Tests ordered today are listed in the Patient Instructions below. Patient Instructions  Medication Instructions:  Your physician recommends that you continue on your current medications as directed. Please refer to the Current Medication list given to you  today.   Labwork: BNP BMP MAG  Testing/Procedures: None  Follow-Up: Follow up with Dr.  Mallipeddi or [place], PA-C in 6 months.   Any Other Special Instructions Will Be Listed Below (If Applicable).     If you need a refill on your cardiac medications before your next appointment, please call your pharmacy.    Signed, Erma Heritage, PA-C  11/12/2022 2:16 PM    [facility] [address] [place], [place] [address] Phone: [phone] Fax: [phone]

## 2022-11-12 NOTE — Telephone Encounter (Signed)
Lab results discussed with patient, he agrees to start magnesium 400 mg daily and ask pcp to repeat magnesium level at next visit.

## 2022-12-09 DIAGNOSIS — E1169 Type 2 diabetes mellitus with other specified complication: Secondary | ICD-10-CM | POA: Diagnosis not present

## 2022-12-09 DIAGNOSIS — E782 Mixed hyperlipidemia: Secondary | ICD-10-CM | POA: Diagnosis not present

## 2022-12-10 LAB — LAB REPORT - SCANNED
A1c: 7.2
Albumin, Urine POC: 76.1
Creatinine, POC: 187 mg/dL
EGFR: 70
Microalb Creat Ratio: 41

## 2022-12-11 ENCOUNTER — Other Ambulatory Visit: Payer: Self-pay | Admitting: Internal Medicine

## 2022-12-11 ENCOUNTER — Telehealth: Payer: Self-pay | Admitting: Internal Medicine

## 2022-12-11 MED ORDER — BISOPROLOL FUMARATE 5 MG PO TABS: 5.0000 mg | ORAL_TABLET | Freq: Every day | ORAL | 1 refills | Status: DC

## 2022-12-11 NOTE — Telephone Encounter (Signed)
*  STAT* If patient is at the pharmacy, call can be transferred to refill team.   1. Which medications need to be refilled? (please list name of each medication and dose if known) bisoprolol (ZEBETA) 5 MG tablet   2. Which pharmacy/location (including street and city if local pharmacy) is medication to be sent to? [facility] - [place], [place] - [place] [address]   3. Do they need a 30 day or 90 day supply? 90

## 2022-12-11 NOTE — Telephone Encounter (Signed)
Completed.

## 2022-12-13 DIAGNOSIS — R600 Localized edema: Secondary | ICD-10-CM | POA: Diagnosis not present

## 2022-12-13 DIAGNOSIS — E782 Mixed hyperlipidemia: Secondary | ICD-10-CM | POA: Diagnosis not present

## 2022-12-13 DIAGNOSIS — D649 Anemia, unspecified: Secondary | ICD-10-CM | POA: Diagnosis not present

## 2022-12-13 DIAGNOSIS — E1169 Type 2 diabetes mellitus with other specified complication: Secondary | ICD-10-CM | POA: Diagnosis not present

## 2022-12-13 DIAGNOSIS — K754 Autoimmune hepatitis: Secondary | ICD-10-CM | POA: Diagnosis not present

## 2022-12-13 DIAGNOSIS — R251 Tremor, unspecified: Secondary | ICD-10-CM | POA: Diagnosis not present

## 2022-12-13 DIAGNOSIS — I10 Essential (primary) hypertension: Secondary | ICD-10-CM | POA: Diagnosis not present

## 2022-12-13 DIAGNOSIS — K219 Gastro-esophageal reflux disease without esophagitis: Secondary | ICD-10-CM | POA: Diagnosis not present

## 2022-12-13 DIAGNOSIS — E1142 Type 2 diabetes mellitus with diabetic polyneuropathy: Secondary | ICD-10-CM | POA: Diagnosis not present

## 2022-12-13 DIAGNOSIS — F419 Anxiety disorder, unspecified: Secondary | ICD-10-CM | POA: Diagnosis not present

## 2023-01-10 DIAGNOSIS — E669 Obesity, unspecified: Secondary | ICD-10-CM | POA: Diagnosis not present

## 2023-01-10 DIAGNOSIS — Z6832 Body mass index (BMI) 32.0-32.9, adult: Secondary | ICD-10-CM | POA: Diagnosis not present

## 2023-01-10 DIAGNOSIS — I10 Essential (primary) hypertension: Secondary | ICD-10-CM | POA: Diagnosis not present

## 2023-02-19 ENCOUNTER — Other Ambulatory Visit (HOSPITAL_COMMUNITY): Payer: Self-pay | Admitting: *Deleted

## 2023-02-19 ENCOUNTER — Encounter (HOSPITAL_COMMUNITY): Payer: Self-pay | Admitting: Emergency Medicine

## 2023-02-19 ENCOUNTER — Observation Stay (HOSPITAL_BASED_OUTPATIENT_CLINIC_OR_DEPARTMENT_OTHER): Payer: Medicare Other

## 2023-02-19 ENCOUNTER — Emergency Department (HOSPITAL_COMMUNITY): Payer: Medicare Other

## 2023-02-19 ENCOUNTER — Inpatient Hospital Stay (HOSPITAL_COMMUNITY)
Admission: EM | Admit: 2023-02-19 | Discharge: 2023-02-21 | DRG: 322 | Disposition: A | Discharge status: Home / Self Care | Payer: Medicare Other | Attending: Internal Medicine | Admitting: Internal Medicine

## 2023-02-19 ENCOUNTER — Other Ambulatory Visit: Payer: Self-pay

## 2023-02-19 DIAGNOSIS — I25110 Atherosclerotic heart disease of native coronary artery with unstable angina pectoris: Secondary | ICD-10-CM | POA: Diagnosis not present

## 2023-02-19 DIAGNOSIS — R079 Chest pain, unspecified: Principal | ICD-10-CM | POA: Diagnosis present

## 2023-02-19 DIAGNOSIS — Z79899 Other long term (current) drug therapy: Secondary | ICD-10-CM | POA: Diagnosis not present

## 2023-02-19 DIAGNOSIS — Z955 Presence of coronary angioplasty implant and graft: Secondary | ICD-10-CM

## 2023-02-19 DIAGNOSIS — E785 Hyperlipidemia, unspecified: Secondary | ICD-10-CM | POA: Diagnosis not present

## 2023-02-19 DIAGNOSIS — G4733 Obstructive sleep apnea (adult) (pediatric): Secondary | ICD-10-CM | POA: Diagnosis present

## 2023-02-19 DIAGNOSIS — Z87891 Personal history of nicotine dependence: Secondary | ICD-10-CM

## 2023-02-19 DIAGNOSIS — J45909 Unspecified asthma, uncomplicated: Secondary | ICD-10-CM | POA: Diagnosis present

## 2023-02-19 DIAGNOSIS — Z8261 Family history of arthritis: Secondary | ICD-10-CM | POA: Diagnosis not present

## 2023-02-19 DIAGNOSIS — Z825 Family history of asthma and other chronic lower respiratory diseases: Secondary | ICD-10-CM

## 2023-02-19 DIAGNOSIS — Z7982 Long term (current) use of aspirin: Secondary | ICD-10-CM

## 2023-02-19 DIAGNOSIS — E7800 Pure hypercholesterolemia, unspecified: Secondary | ICD-10-CM | POA: Diagnosis not present

## 2023-02-19 DIAGNOSIS — Z806 Family history of leukemia: Secondary | ICD-10-CM

## 2023-02-19 DIAGNOSIS — N179 Acute kidney failure, unspecified: Secondary | ICD-10-CM | POA: Diagnosis not present

## 2023-02-19 DIAGNOSIS — Z7984 Long term (current) use of oral hypoglycemic drugs: Secondary | ICD-10-CM | POA: Diagnosis not present

## 2023-02-19 DIAGNOSIS — I2510 Atherosclerotic heart disease of native coronary artery without angina pectoris: Secondary | ICD-10-CM | POA: Diagnosis not present

## 2023-02-19 DIAGNOSIS — E119 Type 2 diabetes mellitus without complications: Secondary | ICD-10-CM

## 2023-02-19 DIAGNOSIS — I10 Essential (primary) hypertension: Secondary | ICD-10-CM | POA: Diagnosis present

## 2023-02-19 DIAGNOSIS — Z794 Long term (current) use of insulin: Secondary | ICD-10-CM

## 2023-02-19 DIAGNOSIS — K219 Gastro-esophageal reflux disease without esophagitis: Secondary | ICD-10-CM | POA: Diagnosis present

## 2023-02-19 DIAGNOSIS — I440 Atrioventricular block, first degree: Secondary | ICD-10-CM | POA: Diagnosis present

## 2023-02-19 DIAGNOSIS — R0789 Other chest pain: Secondary | ICD-10-CM | POA: Diagnosis present

## 2023-02-19 DIAGNOSIS — F419 Anxiety disorder, unspecified: Secondary | ICD-10-CM | POA: Diagnosis not present

## 2023-02-19 DIAGNOSIS — I200 Unstable angina: Secondary | ICD-10-CM | POA: Diagnosis not present

## 2023-02-19 LAB — BASIC METABOLIC PANEL
Anion gap: 10 (ref 5–15)
BUN: 32 mg/dL — ABNORMAL HIGH (ref 8–23)
CO2: 25 mmol/L (ref 22–32)
Calcium: 8.6 mg/dL — ABNORMAL LOW (ref 8.9–10.3)
Chloride: 101 mmol/L (ref 98–111)
Creatinine, Ser: 1.6 mg/dL — ABNORMAL HIGH (ref 0.61–1.24)
GFR, Estimated: 45 mL/min — ABNORMAL LOW (ref >60)
Glucose, Bld: 165 mg/dL — ABNORMAL HIGH (ref 70–99)
Potassium: 4.4 mmol/L (ref 3.5–5.1)
Sodium: 136 mmol/L (ref 135–145)

## 2023-02-19 LAB — CBC
HCT: 33.8 % — ABNORMAL LOW (ref 39.0–52.0)
Hemoglobin: 11.5 g/dL — ABNORMAL LOW (ref 13.0–17.0)
MCH: 32.1 pg (ref 26.0–34.0)
MCHC: 34 g/dL (ref 30.0–36.0)
MCV: 94.4 fL (ref 80.0–100.0)
Platelets: 289 10*3/uL (ref 150–400)
RBC: 3.58 MIL/uL — ABNORMAL LOW (ref 4.22–5.81)
RDW: 15.8 % — ABNORMAL HIGH (ref 11.5–15.5)
WBC: 10.5 10*3/uL (ref 4.0–10.5)
nRBC: 0 % (ref 0.0–0.2)

## 2023-02-19 LAB — LIPID PANEL
Cholesterol: 115 mg/dL (ref 0–200)
HDL: 27 mg/dL — ABNORMAL LOW (ref >40)
LDL Cholesterol: 67 mg/dL (ref 0–99)
Total CHOL/HDL Ratio: 4.3 RATIO
Triglycerides: 107 mg/dL (ref <150)
VLDL: 21 mg/dL (ref 0–40)

## 2023-02-19 LAB — ECHOCARDIOGRAM COMPLETE
Area-P 1/2: 2.8 cm2
Height: 72 in
MV M vel: 5.81 m/s
MV Peak grad: 135 mmHg
S' Lateral: 2.8 cm
Weight: 3840 oz

## 2023-02-19 LAB — TROPONIN I (HIGH SENSITIVITY)
Troponin I (High Sensitivity): 3 ng/L (ref <18)
Troponin I (High Sensitivity): 3 ng/L (ref <18)
Troponin I (High Sensitivity): 3 ng/L (ref <18)
Troponin I (High Sensitivity): 3 ng/L (ref <18)

## 2023-02-19 LAB — GLUCOSE, CAPILLARY
Glucose-Capillary: 177 mg/dL — ABNORMAL HIGH (ref 70–99)
Glucose-Capillary: 229 mg/dL — ABNORMAL HIGH (ref 70–99)
Glucose-Capillary: 159 mg/dL — ABNORMAL HIGH (ref 70–99)

## 2023-02-19 LAB — CBG MONITORING, ED: Glucose-Capillary: 138 mg/dL — ABNORMAL HIGH (ref 70–99)

## 2023-02-19 LAB — HEMOGLOBIN A1C
Hgb A1c MFr Bld: 6 % — ABNORMAL HIGH (ref 4.8–5.6)
Mean Plasma Glucose: 125.5 mg/dL

## 2023-02-19 MED ORDER — MECLIZINE HCL 12.5 MG PO TABS
25.0000 mg | ORAL_TABLET | Freq: Once | ORAL | Status: AC
  Administered 2023-02-19: 25 mg via ORAL
  Filled 2023-02-19: qty 2

## 2023-02-19 MED ORDER — ATORVASTATIN CALCIUM 80 MG PO TABS
80.0000 mg | ORAL_TABLET | Freq: Every day | ORAL | Status: DC
  Administered 2023-02-19 – 2023-02-21 (×3): 80 mg via ORAL
  Filled 2023-02-19 (×2): qty 2
  Filled 2023-02-19: qty 1

## 2023-02-19 MED ORDER — ACETAMINOPHEN 325 MG PO TABS: 650.0000 mg | ORAL_TABLET | ORAL | Status: DC | PRN

## 2023-02-19 MED ORDER — ONDANSETRON HCL 4 MG/2ML IJ SOLN
4.0000 mg | Freq: Once | INTRAMUSCULAR | Status: AC
  Administered 2023-02-19: 4 mg via INTRAVENOUS
  Filled 2023-02-19: qty 2

## 2023-02-19 MED ORDER — SODIUM CHLORIDE 0.9 % IV BOLUS
1000.0000 mL | Freq: Once | INTRAVENOUS | Status: AC
  Administered 2023-02-19: 1000 mL via INTRAVENOUS

## 2023-02-19 MED ORDER — FUROSEMIDE 40 MG PO TABS: 40.0000 mg | ORAL_TABLET | Freq: Every day | ORAL | Status: DC

## 2023-02-19 MED ORDER — ALUM & MAG HYDROXIDE-SIMETH 200-200-20 MG/5ML PO SUSP
30.0000 mL | Freq: Once | ORAL | Status: AC
  Administered 2023-02-19: 30 mL via ORAL
  Filled 2023-02-19: qty 30

## 2023-02-19 MED ORDER — GLIPIZIDE ER 10 MG PO TB24
10.0000 mg | ORAL_TABLET | Freq: Every day | ORAL | Status: DC
  Administered 2023-02-19 – 2023-02-21 (×3): 10 mg via ORAL
  Filled 2023-02-19: qty 1
  Filled 2023-02-19 (×2): qty 2

## 2023-02-19 MED ORDER — ADULT MULTIVITAMIN W/MINERALS CH
1.0000 | ORAL_TABLET | Freq: Every day | ORAL | Status: DC
  Administered 2023-02-19 – 2023-02-21 (×3): 1 via ORAL
  Filled 2023-02-19 (×3): qty 1

## 2023-02-19 MED ORDER — MAGNESIUM OXIDE -MG SUPPLEMENT 400 (240 MG) MG PO TABS
400.0000 mg | ORAL_TABLET | Freq: Every day | ORAL | Status: DC
  Administered 2023-02-19 – 2023-02-21 (×3): 400 mg via ORAL
  Filled 2023-02-19 (×3): qty 1

## 2023-02-19 MED ORDER — ALPRAZOLAM 0.25 MG PO TABS
0.2500 mg | ORAL_TABLET | Freq: Two times a day (BID) | ORAL | Status: DC | PRN
  Administered 2023-02-19 (×2): 0.25 mg via ORAL
  Filled 2023-02-19 (×2): qty 1

## 2023-02-19 MED ORDER — HEPARIN SODIUM (PORCINE) 5000 UNIT/ML IJ SOLN
5000.0000 [IU] | Freq: Three times a day (TID) | INTRAMUSCULAR | Status: DC
  Administered 2023-02-19 – 2023-02-21 (×6): 5000 [IU] via SUBCUTANEOUS
  Filled 2023-02-19 (×6): qty 1

## 2023-02-19 MED ORDER — LOSARTAN POTASSIUM 25 MG PO TABS: 25.0000 mg | ORAL_TABLET | Freq: Every day | ORAL | Status: DC

## 2023-02-19 MED ORDER — PREGABALIN 50 MG PO CAPS
50.0000 mg | ORAL_CAPSULE | Freq: Three times a day (TID) | ORAL | Status: DC
  Administered 2023-02-19 – 2023-02-21 (×7): 50 mg via ORAL
  Filled 2023-02-19 (×7): qty 1

## 2023-02-19 MED ORDER — SODIUM CHLORIDE 0.9 % IV SOLN: INTRAVENOUS | Status: DC

## 2023-02-19 MED ORDER — INSULIN ASPART 100 UNIT/ML IJ SOLN
0.0000 [IU] | Freq: Three times a day (TID) | INTRAMUSCULAR | Status: DC
  Administered 2023-02-19: 2 [IU] via SUBCUTANEOUS
  Administered 2023-02-21: 1 [IU] via SUBCUTANEOUS

## 2023-02-19 MED ORDER — ONDANSETRON HCL 4 MG/2ML IJ SOLN: 4.0000 mg | Freq: Four times a day (QID) | INTRAMUSCULAR | Status: DC | PRN

## 2023-02-19 MED ORDER — ASPIRIN 81 MG PO TBEC
81.0000 mg | DELAYED_RELEASE_TABLET | Freq: Every day | ORAL | Status: DC
  Administered 2023-02-19 – 2023-02-21 (×3): 81 mg via ORAL
  Filled 2023-02-19 (×3): qty 1

## 2023-02-19 MED ORDER — PANTOPRAZOLE SODIUM 40 MG PO TBEC
40.0000 mg | DELAYED_RELEASE_TABLET | Freq: Every day | ORAL | Status: DC
  Administered 2023-02-19 – 2023-02-20 (×2): 40 mg via ORAL
  Filled 2023-02-19 (×2): qty 1

## 2023-02-19 MED ORDER — BISOPROLOL FUMARATE 5 MG PO TABS
5.0000 mg | ORAL_TABLET | Freq: Every day | ORAL | Status: DC
  Administered 2023-02-19 – 2023-02-21 (×3): 5 mg via ORAL
  Filled 2023-02-19 (×3): qty 1

## 2023-02-19 NOTE — ED Notes (Signed)
Patient transported to X-ray 

## 2023-02-19 NOTE — Progress Notes (Signed)
*  PRELIMINARY RESULTS* Echocardiogram 2D Echocardiogram has been performed.  Stacey Drain 02/19/2023, 9:34 AM

## 2023-02-19 NOTE — ED Notes (Signed)
ED Provider at bedside. 

## 2023-02-19 NOTE — H&P (Signed)
History and Physical   Patient: Douglas Mason                            PCP: Benita Stabile, MD                    DOB: February 15, 1951            DOA: 02/19/2023 ZDG:[id number]             DOS: 02/19/2023, 7:36 AM  Benita Stabile, MD  Patient coming from:   HOME  I have personally reviewed patient's medical records, in electronic medical records, including:  [place] link, and care everywhere.    Chief Complaint:   Chief Complaint  Patient presents with   Chest Pain    History of present illness:    Douglas Mason is a 72 year old male with past medical history of HTN, HLD, type II DM, OSA, anxiety, presenting with left-sided chest pain.  With radiation to neck and left arm.  Started last night before going to bed associate with some nausea.  Not associated with shortness of breath, cough or fever.   ED evaluation:  Vitals:   02/19/23 0530 02/19/23 0600  BP: (!) 149/85 (!) 159/79  Pulse: 63 64  Resp: 20 14  Temp:    SpO2: 98% 97%    Troponin>> 3 >>> 3 CBC hemoglobin 11.5 BMP within normal limits with exception of BUN 32/creatinine 1.60, calcium 8.6, glucose 165   Quested to be admitted for chest pain ruling out MI, with AKI    Patient Denies having: Fever, Chills, Cough, SOB,Abd pain, N/V/D, headache, dizziness, lightheadedness,  Dysuria, Joint pain, rash, open wounds    Review of Systems: As per HPI, otherwise 10 point review of systems were negative.   ----------------------------------------------------------------------------------------------------------------------  Allergies  Allergen Reactions   Lisinopril Cough    Home MEDs:  Prior to Admission medications   Medication Sig Start Date End Date Taking? Authorizing Provider  albuterol (PROVENTIL HFA;VENTOLIN HFA) 108 (90 BASE) MCG/ACT inhaler Inhale 2 puffs into the lungs every 4 (four) hours as needed for wheezing or shortness of breath. 06/28/14   Raeford Razor, MD  albuterol (PROVENTIL) (2.5  MG/3ML) 0.083% nebulizer solution Take 3 mLs (2.5 mg total) by nebulization every 6 (six) hours as needed for wheezing or shortness of breath. 08/18/14   Parrett, Virgel Bouquet, NP  ALPRAZolam Prudy Feeler) 0.5 MG tablet Take 0.5 mg by mouth at bedtime as needed for anxiety. 01/12/14   [provider]  aspirin EC 81 MG tablet Take 81 mg by mouth daily.    [provider]  atorvastatin (LIPITOR) 40 MG tablet Take 40 mg by mouth daily.    [provider]  bisoprolol (ZEBETA) 5 MG tablet Take 1 tablet (5 mg total) by mouth daily. 12/11/22   Chandrasekhar, Lafayette Dragon A, MD  furosemide (LASIX) 40 MG tablet Take 40 mg by mouth daily. 09/05/21   [provider]  glipiZIDE (GLUCOTROL XL) 10 MG 24 hr tablet Take 1 tablet by mouth 2 (two) times a day. 12/04/18   [provider]  HYDROcodone-acetaminophen (NORCO/VICODIN) 5-325 MG tablet Take 1 tablet by mouth at bedtime as needed for pain. 09/05/21   [provider]  JARDIANCE 25 MG TABS tablet Take 25 mg by mouth daily. 05/17/21   [provider]  losartan (COZAAR) 50 MG tablet Take 50 mg by mouth daily.    [provider]  Magnesium Oxide 400 MG CAPS Take 1 capsule (400 mg total) by mouth daily. 11/12/22   Strader, Lennart Pall, PA-C  metFORMIN (GLUCOPHAGE-XR) 500 MG 24 hr tablet Take 2 tablets (1,000 mg total) by mouth 2 (two) times a day. 01/16/19   Lucretia Roers, MD  Multiple Vitamins-Minerals (MULTIVITAMINS THER. W/MINERALS) TABS tablet Take 1 tablet by mouth daily.    [provider]  pantoprazole (PROTONIX) 40 MG tablet Take 1 tablet (40 mg total) by mouth 2 (two) times daily before a meal. Patient taking differently: Take 40 mg by mouth at bedtime. 03/17/14   Lorenda Hatchet, MD  potassium chloride SA (KLOR-CON M) 20 MEQ tablet Take 20 mEq by mouth daily. 09/05/21   [provider]  pregabalin (LYRICA) 50 MG capsule Take 50 mg by mouth 3 (three) times daily.    [provider]   TRESIBA FLEXTOUCH 100 UNIT/ML FlexTouch Pen Inject 10 Units into the skin daily. 08/10/21   [provider]    PRN MEDs: acetaminophen, ALPRAZolam, ondansetron (ZOFRAN) IV  Past Medical History:  Diagnosis Date   Bronchitis    Diabetes mellitus without complication (HCC)    GERD (gastroesophageal reflux disease)    High cholesterol    Hypertension    Pleurisy    Pneumonia    Sleep apnea    nurse in pre-op observed pt sats drop to 80-82% while sleeping on RA,wake up would go to 87%    Past Surgical History:  Procedure Laterality Date   CHOLECYSTECTOMY N/A 01/13/2019   Procedure: LAPAROSCOPIC CHOLECYSTECTOMY;  Surgeon: Lucretia Roers, MD;  Location: [facility];  Service: General;  Laterality: N/A;     reports that he quit smoking about 46 years ago. His smoking use included cigarettes. He has a 60.00 pack-year smoking history. He quit smokeless tobacco use about 19 years ago.  His smokeless tobacco use included chew. He reports that he does not drink alcohol and does not use drugs.   Family History  Problem Relation Age of Onset   Asthma Mother    Leukemia Mother    Rheum arthritis Mother    Rheum arthritis Maternal Grandmother     Physical Exam:   Vitals:   02/19/23 0333 02/19/23 0500 02/19/23 0530 02/19/23 0600  BP:  (!) 145/70 (!) 149/85 (!) 159/79  Pulse:  60 63 64  Resp:  16 20 14   Temp: 97.7 F (36.5 C)     TempSrc: Oral     SpO2:  98% 98% 97%  Weight:      Height:       Constitutional: NAD, calm, comfortable Eyes: PERRL, lids and conjunctivae normal ENMT: Mucous membranes are moist. Posterior pharynx clear of any exudate or lesions.Normal dentition.  Neck: normal, supple, no masses, no thyromegaly Respiratory: clear to auscultation bilaterally, no wheezing, no crackles. Normal respiratory effort. No accessory muscle use.  Cardiovascular: Regular rate and rhythm, no murmurs / rubs / gallops. No extremity edema. 2+ pedal pulses. No carotid bruits.   Abdomen: no tenderness, no masses palpated. No hepatosplenomegaly. Bowel sounds positive.  Musculoskeletal: no clubbing / cyanosis. No joint deformity upper and lower extremities. Good ROM, no contractures. Normal muscle tone.  Neurologic: CN II-XII grossly intact. Sensation intact, DTR normal. Strength 5/5 in all 4.  Psychiatric: Normal judgment and insight. Alert and oriented x 3. Normal mood.  Skin: no rashes, lesions, ulcers. No induration Decubitus/ulcers:  Wounds: per nursing documentation  Labs on admission:    I have personally reviewed following labs and imaging studies  CBC: Recent Labs  Lab 02/19/23 0337  WBC 10.5  HGB 11.5*  HCT 33.8*  MCV 94.4  PLT 289   Basic Metabolic Panel: Recent Labs  Lab 02/19/23 0337  NA 136  K 4.4  CL 101  CO2 25  GLUCOSE 165*  BUN 32*  CREATININE 1.60*  CALCIUM 8.6*   Urine analysis:    Component Value Date/Time   COLORURINE YELLOW 01/13/2019 0156   APPEARANCEUR CLEAR 01/13/2019 0156   LABSPEC >1.046 (H) 01/13/2019 0156   PHURINE 5.0 01/13/2019 0156   GLUCOSEU >=500 (A) 01/13/2019 0156   HGBUR NEGATIVE 01/13/2019 0156   BILIRUBINUR NEGATIVE 01/13/2019 0156   KETONESUR NEGATIVE 01/13/2019 0156   PROTEINUR NEGATIVE 01/13/2019 0156   UROBILINOGEN 4.0 (H) 03/13/2014 1115   NITRITE NEGATIVE 01/13/2019 0156   LEUKOCYTESUR NEGATIVE 01/13/2019 0156    Last A1C:  Lab Results  Component Value Date   HGBA1C 5.5 03/13/2014     Radiologic Exams on Admission:   DG Chest 2 View  Result Date: 02/19/2023 CLINICAL DATA:  Chest pain on the left EXAM: CHEST - 2 VIEW COMPARISON:  09/10/2021 FINDINGS: Cardiac shadow is mildly prominent but stable. Elevation of the right hemidiaphragm is noted. No focal infiltrate or effusion is seen. No bony abnormality is noted. IMPRESSION: No active cardiopulmonary disease. Electronically Signed   By: Alcide Clever M.D.   On: 02/19/2023 04:00    EKG:   Independently  reviewed.  Orders placed or performed during the hospital encounter of 02/19/23   EKG 12-Lead   EKG 12-Lead   EKG 12-Lead   EKG 12-Lead   ED EKG   ED EKG   EKG 12-Lead   EKG 12-Lead   EKG 12-Lead (at 6am)   ---------------------------------------------------------------------------------------------------------------------------------------    Assessment / Plan:   Principal Problem:   Atypical chest pain Active Problems:   Chest pain with moderate risk of acute coronary syndrome   AKI (acute kidney injury) (HCC)   Essential hypertension   GERD (gastroesophageal reflux disease)   OSA (obstructive sleep apnea)   Anxiety   Diabetes mellitus type II, non insulin dependent (HCC)   Assessment and Plan: * Atypical chest pain -Admitting to telemetry unit -Continue to recycle cardiac enzymes, -Currently troponin 3, 3 -NPO -2D echocardiogram -Consult cardiology for evaluation -Continue with high-dose statins, -Continue aspirin, as needed nitroglycerin, morphine, (Since first 2 troponins are negative, no changes in EKG, will not initiate heparin drip at this point)  AKI (acute kidney injury) (HCC) - Acute AKI Lab Results  Component Value Date   CREATININE 1.60 (H) 02/19/2023   CREATININE 1.06 11/12/2022   CREATININE 1.08 02/12/2022   -We will holding home medication of Lasix and losartan Will continue with gentle IV fluid hydration -Will hold her nephrotoxic including metformin and Lasix for now   Chest pain with moderate risk of acute coronary syndrome -Admitted for observation, monitoring closely -We will rule out ACS with cardiac workup  Anxiety - Currently stable, continuing home medication of Xanax as needed  OSA (obstructive sleep apnea) - Monitoring Continue home CPAP  GERD (gastroesophageal reflux disease) - Continue PPI, along with Maalox   Essential hypertension - Currently stable, resuming home medication -Will holding Lasix till a.m.,  losartan  Diabetes mellitus type II, non insulin dependent (HCC) We will holding home medication with exception of glipizide -N.p.o. checking CBG q. 4-6 hours, with SSI coverage -Once started p.o.,  will check CBG q. ACHS with SSI coverage -Check a hemoglobin A1c -We will holding home medication of metformin, Tresiba , Jardiance        Consults called:   Cardiology -------------------------------------------------------------------------------------------------------------------------------------------- DVT prophylaxis:  heparin injection 5,000 Units Start: 02/19/23 0730 SCDs Start: 02/19/23 0718   Code Status:   Code Status: Full Code   Admission status: Patient will be admitted as Observation, with a less than 2 midnight length of stay. Level of care: Telemetry   Family Communication:  none at bedside  (The above findings and plan of care has been discussed with patient in detail, the patient expressed understanding and agreement of above plan)  --------------------------------------------------------------------------------------------------------------------------------------------------  Disposition Plan:  Anticipated 1-2 days Status is: Observation The patient remains OBS appropriate and will d/c before 2 midnights.     ----------------------------------------------------------------------------------------------------------------------------------------------------  Time spent:  47  Min.  Was spent seeing and evaluating the patient, reviewing all medical records, drawn plan of care.  SIGNED: Kendell Bane, MD, FHM. FAAFP. [place] [facility], Pager  (Please use [email or web address] to page/ or secure chat through epic) If 7PM-7AM, please contact night-coverage [email or web address],  02/19/2023, 7:36 AM

## 2023-02-19 NOTE — ED Notes (Signed)
Patient getting Echo at this time. Unable to transfer to 303

## 2023-02-19 NOTE — Assessment & Plan Note (Signed)
-  Stable, complaining of no chest pain overnight -Currently troponin 3, 3, 3 -NPO -2D echocardiogram -Consult cardiology for evaluation: Recommending cardiac cath versus Lexiscan nuclear stress test today 02/20/2023 at [facility]   (Kidney function improved) -Continue with high-dose statins, -Continue aspirin, as needed nitroglycerin, morphine,  (Since first 2 troponins are negative, no changes in EKG, will not initiate heparin drip at this point)

## 2023-02-19 NOTE — Progress Notes (Signed)
Dietary notified of patient need of breakfast tray.

## 2023-02-19 NOTE — Assessment & Plan Note (Signed)
-   Currently stable, continuing home medication of Xanax as needed

## 2023-02-19 NOTE — Assessment & Plan Note (Signed)
-   Currently stable, resuming home medication -Will holding Lasix, and  losartan

## 2023-02-19 NOTE — Assessment & Plan Note (Addendum)
We will holding home medication with exception of glipizide -N.p.o. checking CBG q. 4-6 hours, with SSI coverage -Once started p.o., will check CBG q. ACHS with SSI coverage -Check a hemoglobin A1c -We will holding home medication of metformin, Douglas Mason , Jardiance

## 2023-02-19 NOTE — Hospital Course (Signed)
Douglas Mason is a 72 year old male with past medical history of HTN, HLD, type II DM, OSA, anxiety, presenting with left-sided chest pain.  With radiation to neck and left arm.  Started last night before going to bed associate with some nausea.  Not associated with shortness of breath, cough or fever.   ED evaluation:  Vitals:   02/19/23 0530 02/19/23 0600  BP: (!) 149/85 (!) 159/79  Pulse: 63 64  Resp: 20 14  Temp:    SpO2: 98% 97%    Troponin>> 3 >>> 3 CBC hemoglobin 11.5 BMP within normal limits with exception of BUN 32/creatinine 1.60, calcium 8.6, glucose 165   Quested to be admitted for chest pain ruling out MI, with AKI

## 2023-02-19 NOTE — Assessment & Plan Note (Signed)
-   Continue PPI, along with Maalox

## 2023-02-19 NOTE — ED Provider Notes (Signed)
[place] EMERGENCY DEPARTMENT AT [facility] Provider Note   CSN: [id number] Arrival date & time: 02/19/23  5284     History  Chief Complaint  Patient presents with   Chest Pain    Douglas Mason is a 72 y.o. male.  Patient is a 72 year old male with past medical history of diabetes, hypertension, hyperlipidemia, anxiety, obstructive sleep apnea.  Patient presenting with complaints of chest and neck discomfort.  This started this evening while he was lying in bed attempting to fall asleep.  He describes discomfort to the left upper chest and neck radiating down the left arm that made him feel nauseated..  This lasted for several hours, then his wife convinced him to come be evaluated.  He denies to me he is having any shortness of breath, cough, or fevers.  Symptoms have improved shortly after arrival to the ER.  Patient with prior stress testing in 2015, but no other cardiac history.  The history is provided by the patient.       Home Medications Prior to Admission medications   Medication Sig Start Date End Date Taking? Authorizing Provider  albuterol (PROVENTIL HFA;VENTOLIN HFA) 108 (90 BASE) MCG/ACT inhaler Inhale 2 puffs into the lungs every 4 (four) hours as needed for wheezing or shortness of breath. 06/28/14   Raeford Razor, MD  albuterol (PROVENTIL) (2.5 MG/3ML) 0.083% nebulizer solution Take 3 mLs (2.5 mg total) by nebulization every 6 (six) hours as needed for wheezing or shortness of breath. 08/18/14   Parrett, Virgel Bouquet, NP  ALPRAZolam Prudy Feeler) 0.5 MG tablet Take 0.5 mg by mouth at bedtime as needed for anxiety. 01/12/14   [provider]  aspirin EC 81 MG tablet Take 81 mg by mouth daily.    [provider]  atorvastatin (LIPITOR) 40 MG tablet Take 40 mg by mouth daily.    [provider]  bisoprolol (ZEBETA) 5 MG tablet Take 1 tablet (5 mg total) by mouth daily. 12/11/22   Chandrasekhar, Lafayette Dragon A, MD  furosemide (LASIX) 40 MG  tablet Take 40 mg by mouth daily. 09/05/21   [provider]  glipiZIDE (GLUCOTROL XL) 10 MG 24 hr tablet Take 1 tablet by mouth 2 (two) times a day. 12/04/18   [provider]  HYDROcodone-acetaminophen (NORCO/VICODIN) 5-325 MG tablet Take 1 tablet by mouth at bedtime as needed for pain. 09/05/21   [provider]  JARDIANCE 25 MG TABS tablet Take 25 mg by mouth daily. 05/17/21   [provider]  losartan (COZAAR) 50 MG tablet Take 50 mg by mouth daily.    [provider]  Magnesium Oxide 400 MG CAPS Take 1 capsule (400 mg total) by mouth daily. 11/12/22   Strader, Lennart Pall, PA-C  metFORMIN (GLUCOPHAGE-XR) 500 MG 24 hr tablet Take 2 tablets (1,000 mg total) by mouth 2 (two) times a day. 01/16/19   Lucretia Roers, MD  Multiple Vitamins-Minerals (MULTIVITAMINS THER. W/MINERALS) TABS tablet Take 1 tablet by mouth daily.    [provider]  pantoprazole (PROTONIX) 40 MG tablet Take 1 tablet (40 mg total) by mouth 2 (two) times daily before a meal. Patient taking differently: Take 40 mg by mouth at bedtime. 03/17/14   Lorenda Hatchet, MD  potassium chloride SA (KLOR-CON M) 20 MEQ tablet Take 20 mEq by mouth daily. 09/05/21   [provider]  pregabalin (LYRICA) 50 MG capsule Take 50 mg by mouth 3 (three) times daily.    [provider]  TRESIBA FLEXTOUCH 100 UNIT/ML FlexTouch Pen Inject 10 Units into the skin daily. 08/10/21   [provider]      Allergies    Lisinopril    Review of Systems   Review of Systems  All other systems reviewed and are negative.   Physical Exam Updated Vital Signs BP (!) 145/71   Pulse 65   Temp 97.7 F (36.5 C) (Oral)   Resp 11   Ht 6' (1.829 m)   Wt 108.9 kg   SpO2 97%   BMI 32.55 kg/m  Physical Exam Vitals and nursing note reviewed.  Constitutional:      General: He is not in acute distress.    Appearance: He is well-developed. He is not diaphoretic.  HENT:     Head:  Normocephalic and atraumatic.  Cardiovascular:     Rate and Rhythm: Normal rate and regular rhythm.     Heart sounds: No murmur heard.    No friction rub.  Pulmonary:     Effort: Pulmonary effort is normal. No respiratory distress.     Breath sounds: Normal breath sounds. No wheezing or rales.  Abdominal:     General: Bowel sounds are normal. There is no distension.     Palpations: Abdomen is soft.     Tenderness: There is no abdominal tenderness.  Musculoskeletal:        General: Normal range of motion.     Cervical back: Normal range of motion and neck supple.     Right lower leg: No tenderness. No edema.     Left lower leg: No tenderness. No edema.  Skin:    General: Skin is warm and dry.  Neurological:     Mental Status: He is alert and oriented to person, place, and time.     Coordination: Coordination normal.     ED Results / Procedures / Treatments   Labs (all labs ordered are listed, but only abnormal results are displayed) Labs Reviewed  BASIC METABOLIC PANEL - Abnormal; Notable for the following components:      Result Value   Glucose, Bld 165 (*)    BUN 32 (*)    Creatinine, Ser 1.60 (*)    Calcium 8.6 (*)    GFR, Estimated 45 (*)    All other components within normal limits  CBC - Abnormal; Notable for the following components:   RBC 3.58 (*)    Hemoglobin 11.5 (*)    HCT 33.8 (*)    RDW 15.8 (*)    All other components within normal limits  TROPONIN I (HIGH SENSITIVITY)    EKG EKG Interpretation  Date/Time:  Wednesday February 19 2023 03:29:12 EDT Ventricular Rate:  61 PR Interval:  204 QRS Duration: 96 QT Interval:  411 QTC Calculation: 414 R Axis:   -2 Text Interpretation: Sinus rhythm Low voltage, precordial leads Abnormal R-wave progression, early transition No significant change since 09/10/2021 Confirmed by Geoffery Lyons ([id number]) on 02/19/2023 4:31:28 AM  Radiology DG Chest 2 View  Result Date: 02/19/2023 CLINICAL DATA:  Chest pain on the left  EXAM: CHEST - 2 VIEW COMPARISON:  09/10/2021 FINDINGS: Cardiac shadow is mildly prominent but stable. Elevation of the right hemidiaphragm is noted. No focal infiltrate or effusion is seen. No bony abnormality is noted. IMPRESSION: No active cardiopulmonary disease. Electronically Signed   By: Alcide Clever M.D.   On: 02/19/2023 04:00    Procedures Procedures    Medications Ordered in ED Medications - No data to display  ED Course/ Medical Decision Making/ A&P  Patient is a 72 year old male with history of diabetes, hyperlipidemia, hypertension.  Patient presenting today with complaints of chest/neck discomfort as described in the HPI.  Patient arrives here with stable vital signs and physical examination which is basically unremarkable.  There is no fever and no hypoxia.  Workup initiated including CBC, basic metabolic panel, and troponin x 2.  CBC basically unremarkable, but does have a slight bump in his creatinine from prior studies.  Today's creatinine is 1.6 whereas his baseline is 1.1.  Troponin x 2 are negative.  Chest x-ray shows no acute process.  Patient has been hydrated with normal saline given his elevated creatinine.  He was also given Zofran for nausea and meclizine for dizziness.  Given the recurrent nature of his symptoms, I do feel as though patient requires further observation.  I have discussed with the hospitalist who agrees to admit.  Final Clinical Impression(s) / ED Diagnoses Final diagnoses:  None    Rx / DC Orders ED Discharge Orders     None         Geoffery Lyons, MD 02/19/23 [phone]

## 2023-02-19 NOTE — Assessment & Plan Note (Signed)
-   Monitoring Continue home CPAP

## 2023-02-19 NOTE — Consult Note (Addendum)
Cardiology Consultation   Patient ID: Douglas Mason MRN: [id number]; DOB: 23-Jan-1951  Admit date: 02/19/2023 Date of Consult: 02/19/2023  PCP:  Benita Stabile, MD   [facility] Providers Cardiologist:  Christell Constant, MD        Patient Profile:   Douglas Mason is a 72 y.o. male with a hx of coronary calcification by CT (low-risk NST in 2015), PAC's, HTN, HLD, Type 2 DM and remote history of atrial flutter (occurring in 2015 and no longer on anticoagulation) who is being seen 02/19/2023 for the evaluation of chest pain at the request of Dr. Thomes Dinning.  History of Present Illness:   Douglas Mason was last examined in clinic by myself in 11/2018 and reported having baseline dyspnea on exertion with no acute change in his symptoms. Dyspnea would improve with inhalers. Reported occasional discomfort along his left shoulder which would resolve with positional changes but no exertional symptoms. He was continued on his current medical therapy.  He presented to [facility] ED this morning for evaluation of chest pain which started yesterday evening. In talking with the patient and his sister today, he reports having progressive dyspnea on exertion for the past 2+ months which is typically most notable when walking up stairs in his home. He does have known asthma but says this feels different and does not improve with the use of his inhalers. Yesterday, he developed dizziness and diaphoresis while shopping in an air conditioned consignment store. Reports he rested and symptoms resolved. Yesterday evening, after going to bed, he developed a sternal chest pressure which radiated into his jaw and left shoulder. Reports symptoms did not resemble his known acid reflux and he has been compliant with Pantoprazole daily. Symptoms did not improve with positional changes. Reports the pain lasted for at least a few hours and spontaneously resolved. He is currently pain-free. He denies any  recent orthopnea, PND or pitting edema. He does take Lasix 40 mg daily as he develops swelling along his feet if he does not take this.  Initial labs showed WBC 10.5, Hgb 11.5, platelets 289, Na+ 136, K+ 4.4 and creatinine 1.60 (previously at 1.06 in 11/2022). Initial and repeat troponin values have been negative at 3. FLP and Hgb A1c pending. LDL was at 77 when checked by his PCP in 12/2022 with Hgb A1c at 7.2. CXR with no active cardiopulmonary abnormalities. EKG shows sinus bradycardia, HR 59 with slight TWI along Lead III.    Past Medical History:  Diagnosis Date   Bronchitis    Diabetes mellitus without complication (HCC)    GERD (gastroesophageal reflux disease)    High cholesterol    Hypertension    Pleurisy    Pneumonia    Sleep apnea    nurse in pre-op observed pt sats drop to 80-82% while sleeping on RA,wake up would go to 87%    Past Surgical History:  Procedure Laterality Date   CHOLECYSTECTOMY N/A 01/13/2019   Procedure: LAPAROSCOPIC CHOLECYSTECTOMY;  Surgeon: Lucretia Roers, MD;  Location: [facility];  Service: General;  Laterality: N/A;     Home Medications:  Prior to Admission medications   Medication Sig Start Date End Date Taking? Authorizing Provider  albuterol (PROVENTIL HFA;VENTOLIN HFA) 108 (90 BASE) MCG/ACT inhaler Inhale 2 puffs into the lungs every 4 (four) hours as needed for wheezing or shortness of breath. 06/28/14  Yes Raeford Razor, MD  albuterol (PROVENTIL) (2.5 MG/3ML) 0.083% nebulizer solution Take 3 mLs (2.5 mg total)  by nebulization every 6 (six) hours as needed for wheezing or shortness of breath. 08/18/14  Yes Parrett, Virgel Bouquet, NP  ALPRAZolam (XANAX) 0.5 MG tablet Take 0.5 mg by mouth at bedtime as needed for anxiety. 01/12/14  Yes [provider]  aspirin EC 81 MG tablet Take 81 mg by mouth daily.   Yes [provider]  atorvastatin (LIPITOR) 40 MG tablet Take 40 mg by mouth daily.   Yes [provider]  bisoprolol  (ZEBETA) 5 MG tablet Take 1 tablet (5 mg total) by mouth daily. 12/11/22  Yes Chandrasekhar, Mahesh A, MD  furosemide (LASIX) 40 MG tablet Take 40 mg by mouth daily. 09/05/21  Yes [provider]  glipiZIDE (GLUCOTROL XL) 10 MG 24 hr tablet Take 1 tablet by mouth 2 (two) times a day. 12/04/18  Yes [provider]  HYDROcodone-acetaminophen (NORCO/VICODIN) 5-325 MG tablet Take 1 tablet by mouth at bedtime as needed for pain. 09/05/21  Yes [provider]  losartan (COZAAR) 50 MG tablet Take 50 mg by mouth daily.   Yes [provider]  Magnesium Oxide 400 MG CAPS Take 1 capsule (400 mg total) by mouth daily. 11/12/22  Yes Strader, [place] M, PA-C  metFORMIN (GLUCOPHAGE-XR) 500 MG 24 hr tablet Take 2 tablets (1,000 mg total) by mouth 2 (two) times a day. 01/16/19  Yes Lucretia Roers, MD  Multiple Vitamins-Minerals (MULTIVITAMINS THER. W/MINERALS) TABS tablet Take 1 tablet by mouth daily.   Yes [provider]  pantoprazole (PROTONIX) 40 MG tablet Take 1 tablet (40 mg total) by mouth 2 (two) times daily before a meal. Patient taking differently: Take 40 mg by mouth at bedtime. 03/17/14  Yes Lorenda Hatchet, MD  potassium chloride SA (KLOR-CON M) 20 MEQ tablet Take 20 mEq by mouth daily. 09/05/21  Yes [provider]  pregabalin (LYRICA) 50 MG capsule Take 50 mg by mouth 3 (three) times daily.   Yes [provider]  TOUJEO SOLOSTAR 300 UNIT/ML Solostar Pen Inject 12 Units into the skin daily. 01/24/23  Yes [provider]  JARDIANCE 25 MG TABS tablet Take 25 mg by mouth daily. Patient not taking: Reported on 02/19/2023 05/17/21   [provider]    Inpatient Medications: Scheduled Meds:  alum & mag hydroxide-simeth  30 mL Oral Once   aspirin EC  81 mg Oral Daily   atorvastatin  80 mg Oral Daily   bisoprolol  5 mg Oral Daily   [START ON 02/20/2023] furosemide  40 mg Oral Daily   glipiZIDE  10 mg Oral Q breakfast   heparin  5,000  Units Subcutaneous Q8H   insulin aspart  0-6 Units Subcutaneous TID WC   [START ON 02/20/2023] losartan  25 mg Oral Daily   Magnesium Oxide  400 mg Oral Daily   multivitamins ther. w/minerals  1 tablet Oral Daily   pantoprazole  40 mg Oral QHS   pregabalin  50 mg Oral TID   Continuous Infusions:  sodium chloride     PRN Meds: acetaminophen, ALPRAZolam, ondansetron (ZOFRAN) IV  Allergies:    Allergies  Allergen Reactions   Lisinopril Cough    Social History:   Social History   Socioeconomic History   Marital status: Married    Spouse name: Not on file   Number of children: Not on file   Years of education: Not on file   Highest education level: Not on file  Occupational History    Employer: [employer]  Comment: [employer]  Tobacco Use   Smoking status: Former    Packs/day: 2.00    Years: 30.00    Additional pack years: 0.00    Total pack years: 60.00    Types: Cigarettes    Quit date: 09/02/1976    Years since quitting: 46.4   Smokeless tobacco: Former    Types: Chew    Quit date: 09/03/2003  Vaping Use   Vaping Use: Never used  Substance and Sexual Activity   Alcohol use: No   Drug use: No   Sexual activity: Yes  Other Topics Concern   Not on file  Social History Narrative   Not on file   Social Determinants of Health   Financial Resource Strain: Not on file  Food Insecurity: Not on file  Transportation Needs: Not on file  Physical Activity: Not on file  Stress: Not on file  Social Connections: Not on file  Intimate Partner Violence: Not on file    Family History:    Family History  Problem Relation Age of Onset   Asthma Mother    Leukemia Mother    Rheum arthritis Mother    Rheum arthritis Maternal Grandmother      ROS:  Please see the history of present illness.   All other ROS reviewed and negative.     Physical Exam/Data:   Vitals:   02/19/23 0333 02/19/23 0500 02/19/23 0530 02/19/23 0600  BP:  (!) 145/70 (!) 149/85 (!) 159/79  Pulse:   60 63 64  Resp:  16 20 14   Temp: 97.7 F (36.5 C)     TempSrc: Oral     SpO2:  98% 98% 97%  Weight:      Height:        Intake/Output Summary (Last 24 hours) at 02/19/2023 0750 Last data filed at 02/19/2023 1610 Gross per 24 hour  Intake 1000 ml  Output 425 ml  Net 575 ml      02/19/2023    3:28 AM 11/12/2022   12:45 PM 09/12/2022   11:07 AM  Last 3 Weights  Weight (lbs) 240 lb 235 lb 239 lb 6.4 oz  Weight (kg) 108.863 kg 106.595 kg 108.591 kg     Body mass index is 32.55 kg/m.  General:  Well nourished, well developed male appearing in no acute distress. HEENT: normal Neck: no JVD Vascular: No carotid bruits; Distal pulses 2+ bilaterally Cardiac:  normal S1, S2; RRR; no murmur  Lungs:  clear to auscultation bilaterally, no wheezing, rhonchi or rales  Abd: soft, nontender, no hepatomegaly  Ext: no pitting edema Musculoskeletal:  No deformities, BUE and BLE strength normal and equal Skin: warm and dry  Neuro:  CNs 2-12 intact, no focal abnormalities noted Psych:  Normal affect   EKG:  The EKG was personally reviewed and demonstrates: Sinus bradycardia, HR 59 with slight TWI along Lead III.  Telemetry:  Telemetry was personally reviewed and demonstrates: NSR, HR in 60's to 70's.   Relevant CV Studies:   NST: 03/2014 IMPRESSION:  1. No scintigraphic evidence of prior infarction or  pharmacologically induced ischemia.  2. Normal wall motion.  Ejection fraction - 78%   Echocardiogram: 10/2021 IMPRESSIONS     1. Left ventricular ejection fraction, by estimation, is 60 to 65%. The  left ventricle has normal function. The left ventricle has no regional  wall motion abnormalities. There is mild left ventricular hypertrophy.  Left ventricular diastolic parameters  were normal.   2. Right ventricular systolic  function is normal. The right ventricular  size is mildly enlarged. There is normal pulmonary artery systolic  pressure. The estimated right ventricular systolic  pressure is 35.3 mmHg.   3. The mitral valve is normal in structure. Trivial mitral valve  regurgitation. No evidence of mitral stenosis.   4. The aortic valve is tricuspid. Aortic valve regurgitation is not  visualized. No aortic stenosis is present.   5. The inferior vena cava is normal in size with greater than 50%  respiratory variability, suggesting right atrial pressure of 3 mmHg.   Laboratory Data:  High Sensitivity Troponin:   Recent Labs  Lab 02/19/23 0337 02/19/23 0533  TROPONINIHS 3 3     Chemistry Recent Labs  Lab 02/19/23 0337  NA 136  K 4.4  CL 101  CO2 25  GLUCOSE 165*  BUN 32*  CREATININE 1.60*  CALCIUM 8.6*  GFRNONAA 45*  ANIONGAP 10    No results for input(s): "PROT", "ALBUMIN", "AST", "ALT", "ALKPHOS", "BILITOT" in the last 168 hours. Lipids No results for input(s): "CHOL", "TRIG", "HDL", "LABVLDL", "LDLCALC", "CHOLHDL" in the last 168 hours.  Hematology Recent Labs  Lab 02/19/23 0337  WBC 10.5  RBC 3.58*  HGB 11.5*  HCT 33.8*  MCV 94.4  MCH 32.1  MCHC 34.0  RDW 15.8*  PLT 289   Thyroid No results for input(s): "TSH", "FREET4" in the last 168 hours.  BNPNo results for input(s): "BNP", "PROBNP" in the last 168 hours.  DDimer No results for input(s): "DDIMER" in the last 168 hours.   Radiology/Studies:  DG Chest 2 View  Result Date: 02/19/2023 CLINICAL DATA:  Chest pain on the left EXAM: CHEST - 2 VIEW COMPARISON:  09/10/2021 FINDINGS: Cardiac shadow is mildly prominent but stable. Elevation of the right hemidiaphragm is noted. No focal infiltrate or effusion is seen. No bony abnormality is noted. IMPRESSION: No active cardiopulmonary disease. Electronically Signed   By: Alcide Clever M.D.   On: 02/19/2023 04:00     Assessment and Plan:   1. Chest Pain/Dyspnea on Exertion concerning for Accelerating Angina  - He reports progressive dyspnea on exertion over the past several months which does not resemble his known asthma and has not  improved with use of inhalers. Presenting with chest pressure which radiated to his neck and arm along with intermittent episodes of diaphoresis. - Hs troponin values have been negative and EKG only shows slight TWI along Lead III. Echocardiogram is pending to assess for any structural abnormalities. - Reviewed options with the patient and also discussed with Dr. Diona Browner. Would anticipate a cardiac catheterization tomorrow for definitive evaluation pending improvement in his renal function. Will tentatively add to the board. He is receiving IV fluids today. If renal function does not improve, can pursue a [employer]. He can eat today but keep NPO after midnight.  - Continue ASA 81mg  daily, Atorvastatin 80 mg daily and Bisoprolol 5 mg daily.  2. HTN - BP overall well-controlled, at 138/78 on most recent check. Continue Bisoprolol 5mg  daily and Losartan 25mg  daily. Holding Lasix as discussed below given his AKI.   3. HLD - LDL at 77 in 12/2022 and repeat FLP pending. He was on Atorvastatin 40mg  daily PTA and this has been titrated to 80mg  daily. Repeat FLP and LFT's in 6-8 weeks.   4. Type 2 DM - Hgb A1c at 7.2 in 12/2022. Management per the admitting team.   5. History of Atrial Flutter - Documented in 2015. No known recurrence and maintaining  NSR this admission. No longer on anticoagulation given his isolated event.   6. AKI - Baseline creatinine 1.0 - 1.1. Elevated to 1.60 on admission. Received a 1 L fluid bolus and currently receiving IV fluids at 100 mL/hr. Will hold PO Lasix. Repeat BMET in AM.    For questions or updates, please contact [facility] Please consult [email or web address] for contact info under    Signed, Ellsworth Lennox, PA-C  02/19/2023 7:50 AM    Attending note:  Patient seen and examined, I reviewed his records and discussed the case with Ms. Patrick Jupiter, I agree with her above findings.  Douglas Mason presents with known history of coronary  calcification by CT imaging (LAD predominant), hypertension, hyperlipidemia, and type 2 diabetes mellitus.  He has a remote history of atrial flutter without recurrence since 2015 and has not been anticoagulated.  Last formal ischemic testing was in 2015 with low risk Myoview.  He presents now with worsening dyspnea on exertion over the last few months, more so than he would expect with his baseline asthma.  Yesterday developed exertional diaphoresis and lightheadedness while shopping and subsequently pressure in his chest radiating to the jaw and left shoulder that occurred early morning hours waking him up from sleep.  On examination he is currently chest pain-free in the ER.  Afebrile, heart rate in the 60s in sinus rhythm by telemetry, blood pressure 138/79.  Lungs with few scattered rhonchi, no wheezing.  Cardiac exam with RRR and no gallop, no significant murmur.  Pertinent lab work includes creatinine 1.6 which is over baseline (does use Lasix at home for intermittent leg swelling), normal high-sensitivity troponin I levels, LDL 67, hemoglobin 11.5, platelets 289.  Chest x-ray reports no acute process.  ECG shows sinus rhythm with no acute ST segment changes.  Patient presents with unstable angina, high-sensitivity troponin I levels are normal.  Also has acute renal insufficiency, possibly exacerbated by Lasix.  Plan is admission to the hospitalist service with gentle hydration.  Follow-up echocardiogram.  Tentatively scheduled for diagnostic cardiac catheterization tomorrow with [facility] pending review of his BMET tomorrow morning.  Continue aspirin, bisoprolol, and Lipitor.  Hold metformin, Lasix and losartan temporarily.  Jonelle Sidle, M.D., F.A.C.C.

## 2023-02-19 NOTE — Progress Notes (Signed)
Transition of Care [facility]) - Inpatient Brief Assessment   Patient Details  Name: Douglas Mason MRN: [id number] Date of Birth: 1950-10-12  Transition of [facility]) CM/SW Contact:    Leitha Bleak, RN Phone Number: 02/19/2023, 1:09 PM   Clinical Narrative:  Patient admitted with atypical chest pain. TOC following. Planning to transfer to cath lab.   Transition of Care Asessment: Insurance and Status: (P) Insurance coverage has been reviewed Patient has primary care physician: (P) Yes Home environment has been reviewed: (P) Home with spouse Prior level of function:: (P) independent Prior/Current Home Services: (P) No current home services   Readmission risk has been reviewed: (P) Yes Transition of care needs: (P) no transition of care needs at this time

## 2023-02-19 NOTE — Assessment & Plan Note (Signed)
-  Admitted for observation, monitoring closely -We will rule out ACS with cardiac workup  -Recommending transfer to [facility] for cardiac cath versus Lexiscan nuclear stress test

## 2023-02-19 NOTE — Assessment & Plan Note (Signed)
-   Acute AKI Lab Results  Component Value Date   CREATININE 1.60 (H) 02/19/2023   CREATININE 1.06 11/12/2022   CREATININE 1.08 02/12/2022   -We will holding home medication of Lasix and losartan Will continue with gentle IV fluid hydration -Will hold her nephrotoxic including metformin and Lasix for now

## 2023-02-19 NOTE — ED Triage Notes (Signed)
Pt to ed pov with c/o of CP that started at 2300 and progressively has gotten worse. Central CP that radiates to left neck/arm. States started as indigestion and became nauseated. Pt states is SOB

## 2023-02-19 NOTE — ED Notes (Signed)
Pt states they are getting dizzy and nauseated. Edp made aware. Pt given a urinal to use

## 2023-02-20 ENCOUNTER — Other Ambulatory Visit: Payer: Self-pay

## 2023-02-20 ENCOUNTER — Inpatient Hospital Stay (HOSPITAL_COMMUNITY)
Admission: EM | Disposition: A | Discharge status: Home / Self Care | Payer: Self-pay | Source: Home / Self Care | Attending: Family Medicine

## 2023-02-20 DIAGNOSIS — K219 Gastro-esophageal reflux disease without esophagitis: Secondary | ICD-10-CM | POA: Diagnosis not present

## 2023-02-20 DIAGNOSIS — Z87891 Personal history of nicotine dependence: Secondary | ICD-10-CM | POA: Diagnosis not present

## 2023-02-20 DIAGNOSIS — I10 Essential (primary) hypertension: Secondary | ICD-10-CM | POA: Diagnosis not present

## 2023-02-20 DIAGNOSIS — Z794 Long term (current) use of insulin: Secondary | ICD-10-CM | POA: Diagnosis not present

## 2023-02-20 DIAGNOSIS — Z7984 Long term (current) use of oral hypoglycemic drugs: Secondary | ICD-10-CM | POA: Diagnosis not present

## 2023-02-20 DIAGNOSIS — J45909 Unspecified asthma, uncomplicated: Secondary | ICD-10-CM | POA: Diagnosis not present

## 2023-02-20 DIAGNOSIS — I200 Unstable angina: Secondary | ICD-10-CM

## 2023-02-20 DIAGNOSIS — E785 Hyperlipidemia, unspecified: Secondary | ICD-10-CM | POA: Diagnosis not present

## 2023-02-20 DIAGNOSIS — I2510 Atherosclerotic heart disease of native coronary artery without angina pectoris: Secondary | ICD-10-CM

## 2023-02-20 DIAGNOSIS — N179 Acute kidney failure, unspecified: Secondary | ICD-10-CM | POA: Diagnosis not present

## 2023-02-20 DIAGNOSIS — E119 Type 2 diabetes mellitus without complications: Secondary | ICD-10-CM | POA: Diagnosis not present

## 2023-02-20 DIAGNOSIS — R0789 Other chest pain: Secondary | ICD-10-CM | POA: Diagnosis not present

## 2023-02-20 DIAGNOSIS — I25110 Atherosclerotic heart disease of native coronary artery with unstable angina pectoris: Secondary | ICD-10-CM | POA: Diagnosis not present

## 2023-02-20 DIAGNOSIS — F419 Anxiety disorder, unspecified: Secondary | ICD-10-CM | POA: Diagnosis not present

## 2023-02-20 DIAGNOSIS — Z79899 Other long term (current) drug therapy: Secondary | ICD-10-CM | POA: Diagnosis not present

## 2023-02-20 DIAGNOSIS — E7800 Pure hypercholesterolemia, unspecified: Secondary | ICD-10-CM | POA: Diagnosis not present

## 2023-02-20 HISTORY — PX: CORONARY IMAGING/OCT: CATH118326

## 2023-02-20 HISTORY — PX: CORONARY STENT INTERVENTION: CATH118234

## 2023-02-20 HISTORY — PX: LEFT HEART CATH AND CORONARY ANGIOGRAPHY: CATH118249

## 2023-02-20 LAB — BASIC METABOLIC PANEL
Anion gap: 9 (ref 5–15)
BUN: 19 mg/dL (ref 8–23)
CO2: 26 mmol/L (ref 22–32)
Calcium: 8.7 mg/dL — ABNORMAL LOW (ref 8.9–10.3)
Chloride: 103 mmol/L (ref 98–111)
Creatinine, Ser: 1.11 mg/dL (ref 0.61–1.24)
GFR, Estimated: >60 mL/min (ref >60)
Glucose, Bld: 133 mg/dL — ABNORMAL HIGH (ref 70–99)
Potassium: 4.5 mmol/L (ref 3.5–5.1)
Sodium: 138 mmol/L (ref 135–145)

## 2023-02-20 LAB — GLUCOSE, CAPILLARY
Glucose-Capillary: 146 mg/dL — ABNORMAL HIGH (ref 70–99)
Glucose-Capillary: 133 mg/dL — ABNORMAL HIGH (ref 70–99)
Glucose-Capillary: 125 mg/dL — ABNORMAL HIGH (ref 70–99)
Glucose-Capillary: 100 mg/dL — ABNORMAL HIGH (ref 70–99)
Glucose-Capillary: 116 mg/dL — ABNORMAL HIGH (ref 70–99)

## 2023-02-20 LAB — POCT ACTIVATED CLOTTING TIME
Activated Clotting Time: 238 seconds
Activated Clotting Time: 324 seconds

## 2023-02-20 SURGERY — LEFT HEART CATH AND CORONARY ANGIOGRAPHY
Anesthesia: LOCAL

## 2023-02-20 MED ORDER — ACETAMINOPHEN 325 MG PO TABS: 650.0000 mg | ORAL_TABLET | ORAL | Status: DC | PRN

## 2023-02-20 MED ORDER — SODIUM CHLORIDE 0.9% FLUSH: 3.0000 mL | INTRAVENOUS | Status: DC | PRN

## 2023-02-20 MED ORDER — FENTANYL CITRATE (PF) 100 MCG/2ML IJ SOLN
INTRAMUSCULAR | Status: AC
  Filled 2023-02-20: qty 2

## 2023-02-20 MED ORDER — SODIUM CHLORIDE 0.9 % IV SOLN: 250.0000 mL | INTRAVENOUS | Status: DC | PRN

## 2023-02-20 MED ORDER — FENTANYL CITRATE (PF) 100 MCG/2ML IJ SOLN
INTRAMUSCULAR | Status: DC | PRN
  Administered 2023-02-20: 25 ug via INTRAVENOUS

## 2023-02-20 MED ORDER — LIDOCAINE HCL (PF) 1 % IJ SOLN
INTRAMUSCULAR | Status: AC
  Filled 2023-02-20: qty 30

## 2023-02-20 MED ORDER — HEPARIN SODIUM (PORCINE) 1000 UNIT/ML IJ SOLN
INTRAMUSCULAR | Status: DC | PRN
  Administered 2023-02-20: 3000 [IU] via INTRAVENOUS
  Administered 2023-02-20: 5000 [IU] via INTRAVENOUS
  Administered 2023-02-20: 6000 [IU] via INTRAVENOUS

## 2023-02-20 MED ORDER — HEPARIN SODIUM (PORCINE) 1000 UNIT/ML IJ SOLN
INTRAMUSCULAR | Status: AC
  Filled 2023-02-20: qty 10

## 2023-02-20 MED ORDER — SODIUM CHLORIDE 0.9% FLUSH: 3.0000 mL | Freq: Two times a day (BID) | INTRAVENOUS | Status: DC

## 2023-02-20 MED ORDER — VERAPAMIL HCL 2.5 MG/ML IV SOLN
INTRAVENOUS | Status: DC | PRN
  Administered 2023-02-20: 10 mL via INTRA_ARTERIAL

## 2023-02-20 MED ORDER — NITROGLYCERIN 1 MG/10 ML FOR IR/CATH LAB
INTRA_ARTERIAL | Status: AC
  Filled 2023-02-20: qty 10

## 2023-02-20 MED ORDER — SODIUM CHLORIDE 0.9 % IV SOLN: INTRAVENOUS | Status: AC

## 2023-02-20 MED ORDER — ONDANSETRON HCL 4 MG/2ML IJ SOLN: 4.0000 mg | Freq: Four times a day (QID) | INTRAMUSCULAR | Status: DC | PRN

## 2023-02-20 MED ORDER — LIDOCAINE HCL (PF) 1 % IJ SOLN
INTRAMUSCULAR | Status: DC | PRN
  Administered 2023-02-20: 5 mL via INTRADERMAL

## 2023-02-20 MED ORDER — VERAPAMIL HCL 2.5 MG/ML IV SOLN
INTRAVENOUS | Status: AC
  Filled 2023-02-20: qty 2

## 2023-02-20 MED ORDER — MIDAZOLAM HCL 2 MG/2ML IJ SOLN
INTRAMUSCULAR | Status: AC
  Filled 2023-02-20: qty 2

## 2023-02-20 MED ORDER — MIDAZOLAM HCL 2 MG/2ML IJ SOLN
INTRAMUSCULAR | Status: DC | PRN
  Administered 2023-02-20: 1 mg via INTRAVENOUS

## 2023-02-20 MED ORDER — HEPARIN (PORCINE) IN NACL 1000-0.9 UT/500ML-% IV SOLN
INTRAVENOUS | Status: DC | PRN
  Administered 2023-02-20 (×2): 500 mL

## 2023-02-20 MED ORDER — HYDRALAZINE HCL 20 MG/ML IJ SOLN: 10.0000 mg | INTRAMUSCULAR | Status: AC | PRN

## 2023-02-20 MED ORDER — ASPIRIN 81 MG PO CHEW: 81.0000 mg | CHEWABLE_TABLET | ORAL | Status: DC

## 2023-02-20 MED ORDER — TICAGRELOR 90 MG PO TABS
90.0000 mg | ORAL_TABLET | Freq: Two times a day (BID) | ORAL | Status: DC
  Administered 2023-02-21: 90 mg via ORAL
  Filled 2023-02-20: qty 1

## 2023-02-20 MED ORDER — LABETALOL HCL 5 MG/ML IV SOLN: 10.0000 mg | INTRAVENOUS | Status: AC | PRN

## 2023-02-20 MED ORDER — IOHEXOL 350 MG/ML SOLN
INTRAVENOUS | Status: DC | PRN
  Administered 2023-02-20: 125 mL

## 2023-02-20 MED ORDER — SODIUM CHLORIDE 0.9 % IV SOLN: INTRAVENOUS | Status: DC

## 2023-02-20 MED ORDER — ASPIRIN 81 MG PO CHEW: 81.0000 mg | CHEWABLE_TABLET | Freq: Every day | ORAL | Status: DC

## 2023-02-20 MED ORDER — TICAGRELOR 90 MG PO TABS
ORAL_TABLET | ORAL | Status: DC | PRN
  Administered 2023-02-20: 180 mg via ORAL

## 2023-02-20 MED ORDER — SODIUM CHLORIDE 0.9% FLUSH
3.0000 mL | Freq: Two times a day (BID) | INTRAVENOUS | Status: DC
  Administered 2023-02-21: 3 mL via INTRAVENOUS

## 2023-02-20 SURGICAL SUPPLY — 22 items
BALLN EMERGE MR 2.5X12 (BALLOONS) ×1
BALLOON EMERGE MR 2.5X12 (BALLOONS) IMPLANT
CATH DRAGONFLY OPSTAR (CATHETERS) IMPLANT
CATH GUIDELINER COAST (CATHETERS) IMPLANT
CATH INFINITI 5FR ANG PIGTAIL (CATHETERS) IMPLANT
CATH LAUNCHER 6FR EBU3.5 (CATHETERS) IMPLANT
CATH OPTITORQUE TIG 4.0 6F (CATHETERS) IMPLANT
DEVICE RAD COMP TR BAND LRG (VASCULAR PRODUCTS) IMPLANT
GLIDESHEATH SLEND SS 6F .021 (SHEATH) IMPLANT
GUIDEWIRE VAS SION BLUE 190 (WIRE) IMPLANT
KIT ENCORE 26 ADVANTAGE (KITS) IMPLANT
KIT ESSENTIALS PG (KITS) IMPLANT
KIT HEART LEFT (KITS) ×2 IMPLANT
PACK CARDIAC CATHETERIZATION (CUSTOM PROCEDURE TRAY) ×2 IMPLANT
STENT SYNERGY XD 3.0X38 (Permanent Stent) IMPLANT
STENT SYNERGY XD 3.0X48 (Permanent Stent) IMPLANT
SYNERGY XD 3.0X38 (Permanent Stent) ×1 IMPLANT
SYNERGY XD 3.0X48 (Permanent Stent) ×1 IMPLANT
TRANSDUCER W/STOPCOCK (MISCELLANEOUS) ×2 IMPLANT
TUBING CIL FLEX 10 FLL-RA (TUBING) ×2 IMPLANT
WIRE EMERALD 3MM-J .025X260CM (WIRE) IMPLANT
WIRE EMERALD 3MM-J .035X260CM (WIRE) IMPLANT

## 2023-02-20 NOTE — Progress Notes (Signed)
PROGRESS NOTE    Patient: Douglas Mason                            PCP: Benita Stabile, MD                    DOB: [birth year]            DOA: 02/19/2023 ZOX:[id number]             DOS: 02/20/2023, 8:03 AM   LOS: 0 days   Date of Service: The patient was seen and examined on 02/20/2023  Subjective:   The patient was seen and examined this morning. Hemodynamically stable. No issues overnight . NPO -anticipation of cardiac cath versus nuclear stress test to [facility]  Brief Narrative:   DOTSON GRASER is a 72 year old male with past medical history of HTN, HLD, type II DM, OSA, anxiety, presenting with left-sided chest pain.  With radiation to neck and left arm.  Started last night before going to bed associate with some nausea.  Not associated with shortness of breath, cough or fever.   ED evaluation:  Vitals-stable Troponin>> 3 >>> 3 CBC hemoglobin 11.5 BMP within normal limits with exception of BUN 32/creatinine 1.60, calcium 8.6, glucose 165   Quested to be admitted for chest pain ruling out MI, with AKI    Assessment & Plan:   Principal Problem:   Atypical chest pain Active Problems:   Chest pain with moderate risk of acute coronary syndrome   AKI (acute kidney injury) (HCC)   Essential hypertension   GERD (gastroesophageal reflux disease)   OSA (obstructive sleep apnea)   Anxiety   Diabetes mellitus type II, non insulin dependent (HCC)     Assessment and Plan: * Atypical chest pain -Stable, complaining of no chest pain overnight -Currently troponin 3, 3, 3 -NPO -2D echocardiogram -Consult cardiology for evaluation: Recommending cardiac cath versus Lexiscan nuclear stress test today 02/20/2023 at [facility]   (Kidney function improved) -Continue with high-dose statins, -Continue aspirin, as needed nitroglycerin, morphine,  (Since first 2 troponins are negative, no changes in EKG, will not initiate heparin drip at this point)  AKI (acute kidney  injury) (HCC) - Acute AKI Lab Results  Component Value Date   CREATININE 1.11 02/20/2023   CREATININE 1.60 (H) 02/19/2023   CREATININE 1.06 11/12/2022   -We will holding home medication of Lasix and losartan -Status post gentle IV fluid hydration -Will hold her nephrotoxic including metformin and Lasix for now   Chest pain with moderate risk of acute coronary syndrome -Admitted for observation, monitoring closely -We will rule out ACS with cardiac workup  -Recommending transfer to [facility] for cardiac cath versus Lexiscan nuclear stress test  Anxiety - Currently stable, continuing home medication of Xanax as needed  OSA (obstructive sleep apnea) - Monitoring Continue home CPAP  GERD (gastroesophageal reflux disease) - Continue PPI, along with Maalox   Essential hypertension - Currently stable, resuming home medication -Will holding Lasix, and  losartan  Diabetes mellitus type II, non insulin dependent (HCC) We will holding home medication with exception of glipizide -N.p.o. checking CBG q. 4-6 hours, with SSI coverage -Once started p.o., will check CBG q. ACHS with SSI coverage -A1c -We will holding home medication of metformin, Tresiba , Jardiance            ----------------------------------------------------------------------------------------------------------------------------------------------- Nutritional status:  The patient's BMI is: Body mass index is 32.05  kg/m. I agree with the assessment and plan as outlined below: Nutrition Status:      ------------------------------------------------------------------------------------------------------------------------------------  DVT prophylaxis:  heparin injection 5,000 Units Start: 02/19/23 0730 SCDs Start: 02/19/23 0718   Code Status:   Code Status: Full Code  Family Communication: Wife present at bedside-updated The above findings and plan of care has been discussed with patient (and  family)  in detail,  they expressed understanding and agreement of above. -Advance care planning has been discussed.   Admission status:   Status is: Observation The patient remains OBS appropriate and will d/c before 2 midnights.   Disposition: From  - home             Planning for discharge in 1-2 days: to   Procedures:   No admission procedures for hospital encounter.   Antimicrobials:  Anti-infectives (From admission, onward)    None        Medication:   aspirin EC  81 mg Oral Daily   atorvastatin  80 mg Oral Daily   bisoprolol  5 mg Oral Daily   glipiZIDE  10 mg Oral Q breakfast   heparin  5,000 Units Subcutaneous Q8H   insulin aspart  0-6 Units Subcutaneous TID WC   magnesium oxide  400 mg Oral Daily   multivitamin with minerals  1 tablet Oral Daily   pantoprazole  40 mg Oral QHS   pregabalin  50 mg Oral TID    acetaminophen, ALPRAZolam, ondansetron (ZOFRAN) IV   Objective:   Vitals:   02/19/23 0802 02/19/23 0957 02/20/23 0326 02/20/23 0447  BP: 138/79 (!) 147/74 (!) 103/53 120/65  Pulse: 65 65 (!) 59 65  Resp: 14 18 20    Temp: 98.2 F (36.8 C) 97.6 F (36.4 C) 97.7 F (36.5 C) 98.2 F (36.8 C)  TempSrc: Oral Oral Oral Oral  SpO2: 97% 99% 99% 96%  Weight:  107.2 kg    Height:        Intake/Output Summary (Last 24 hours) at 02/20/2023 9147 Last data filed at 02/19/2023 2001 Gross per 24 hour  Intake 1061.67 ml  Output 550 ml  Net 511.67 ml   Filed Weights   02/19/23 0328 02/19/23 0957  Weight: 108.9 kg 107.2 kg     Physical examination:   Constitution:  Alert, cooperative, no distress,  Appears calm and comfortable  Psychiatric:   Normal and stable mood and affect, cognition intact,   HEENT:        Normocephalic, PERRL, otherwise with in Normal limits  Chest:         Chest symmetric Cardio vascular:  S1/S2, RRR, No murmure, No Rubs or Gallops  pulmonary: Clear to auscultation bilaterally, respirations unlabored, negative wheezes /  crackles Abdomen: Soft, non-tender, non-distended, bowel sounds,no masses, no organomegaly Muscular skeletal: Limited exam - in bed, able to move all 4 extremities,   Neuro: CNII-XII intact. , normal motor and sensation, reflexes intact  Extremities: No pitting edema lower extremities, +2 pulses  Skin: Dry, warm to touch, negative for any Rashes, No open wounds Wounds: per nursing documentation   ------------------------------------------------------------------------------------------------------------------------------------------    LABs:     Latest Ref Rng & Units 02/19/2023    3:37 AM 09/05/2022    7:03 AM 04/08/2022    7:22 AM  CBC  WBC 4.0 - 10.5 K/uL 10.5  7.7  8.7   Hemoglobin 13.0 - 17.0 g/dL 82.9  56.2  13.0   Hematocrit 39.0 - 52.0 % 33.8  33.4  34.6  Platelets 150 - 400 K/uL 289  299  324       Latest Ref Rng & Units 02/20/2023    4:51 AM 02/19/2023    3:37 AM 11/12/2022    2:02 PM  CMP  Glucose 70 - 99 mg/dL 409  811  914   BUN 8 - 23 mg/dL 19  32  17   Creatinine 0.61 - 1.24 mg/dL 7.82  9.56  2.13   Sodium 135 - 145 mmol/L 138  136  139   Potassium 3.5 - 5.1 mmol/L 4.5  4.4  4.5   Chloride 98 - 111 mmol/L 103  101  101   CO2 22 - 32 mmol/L 26  25  27    Calcium 8.9 - 10.3 mg/dL 8.7  8.6  8.9        Micro Results No results found for this or any previous visit (from the past 240 hour(s)).  Radiology Reports ECHOCARDIOGRAM COMPLETE  Result Date: 02/19/2023    ECHOCARDIOGRAM REPORT   Patient Name:   JOEVON CATALA Date of Exam: 02/19/2023 Medical Rec #:  [id number]         Height:       72.0 in Accession #:    [id number]        Weight:       240.0 lb Date of Birth:  04-15-1951        BSA:          2.302 m Patient Age:    71 years          BP:           138/79 mmHg Patient Gender: M                 HR:           65 bpm. [facility] Procedure: 2D Echo, Cardiac Doppler and Color Doppler Indications:    Chest Pain R07.9  History:        Patient has  prior history of Echocardiogram examinations, most                 recent 11/23/2021. Arrythmias:Atrial Flutter; Risk                 Factors:Hypertension, Diabetes and Dyslipidemia.  Sonographer:    Celesta Gentile RCS Referring Phys: [phone] Alexi Geibel A Scarleth Brame IMPRESSIONS  1. Left ventricular ejection fraction, by estimation, is 55 to 60%. The left ventricle has normal function. The left ventricle has no regional wall motion abnormalities. There is mild concentric left ventricular hypertrophy. Left ventricular diastolic parameters were normal.  2. Right ventricular systolic function is normal. The right ventricular size is normal. There is mildly elevated pulmonary artery systolic pressure. The estimated right ventricular systolic pressure is 39.5 mmHg.  3. The mitral valve is grossly normal. Trivial mitral valve regurgitation.  4. The aortic valve is tricuspid. Aortic valve regurgitation is not visualized.  5. The inferior vena cava is normal in size with greater than 50% respiratory variability, suggesting right atrial pressure of 3 mmHg. Comparison(s): Prior images reviewed side by side. LVEF remains normal range at 55-60%. FINDINGS  Left Ventricle: Left ventricular ejection fraction, by estimation, is 55 to 60%. The left ventricle has normal function. The left ventricle has no regional wall motion abnormalities. The left ventricular internal cavity size was normal in size. There is  mild concentric left ventricular hypertrophy. Left ventricular diastolic parameters were normal. Right Ventricle: The right ventricular size  is normal. No increase in right ventricular wall thickness. Right ventricular systolic function is normal. There is mildly elevated pulmonary artery systolic pressure. The tricuspid regurgitant velocity is 3.02  m/s, and with an assumed right atrial pressure of 3 mmHg, the estimated right ventricular systolic pressure is 39.5 mmHg. Left Atrium: Left atrial size was normal in size. Right Atrium:  Right atrial size was normal in size. Pericardium: There is no evidence of pericardial effusion. Presence of epicardial fat layer. Mitral Valve: The mitral valve is grossly normal. Trivial mitral valve regurgitation. Tricuspid Valve: The tricuspid valve is grossly normal. Tricuspid valve regurgitation is mild. Aortic Valve: The aortic valve is tricuspid. There is mild aortic valve annular calcification. Aortic valve regurgitation is not visualized. Pulmonic Valve: The pulmonic valve was grossly normal. Pulmonic valve regurgitation is trivial. Aorta: The aortic root is normal in size and structure. Venous: The inferior vena cava is normal in size with greater than 50% respiratory variability, suggesting right atrial pressure of 3 mmHg. IAS/Shunts: No atrial level shunt detected by color flow Doppler.  LEFT VENTRICLE PLAX 2D LVIDd:         5.20 cm   Diastology LVIDs:         2.80 cm   LV e' medial:    9.03 cm/s LV PW:         1.30 cm   LV E/e' medial:  11.8 LV IVS:        1.20 cm   LV e' lateral:   12.00 cm/s LVOT diam:     2.20 cm   LV E/e' lateral: 8.9 LV SV:         93 LV SV Index:   40 LVOT Area:     3.80 cm  RIGHT VENTRICLE RV S prime:     13.90 cm/s TAPSE (M-mode): 2.4 cm LEFT ATRIUM             Index        RIGHT ATRIUM           Index LA diam:        4.20 cm 1.82 cm/m   RA Area:     18.30 cm LA Vol (A2C):   49.1 ml 21.33 ml/m  RA Volume:   50.70 ml  22.02 ml/m LA Vol (A4C):   55.4 ml 24.06 ml/m LA Biplane Vol: 58.1 ml 25.24 ml/m  AORTIC VALVE LVOT Vmax:   121.00 cm/s LVOT Vmean:  78.100 cm/s LVOT VTI:    0.244 m  AORTA Ao Root diam: 3.80 cm MITRAL VALVE                TRICUSPID VALVE MV Area (PHT): 2.80 cm     TR Peak grad:   36.5 mmHg MV Decel Time: 271 msec     TR Vmax:        302.00 cm/s MR Peak grad: 135.0 mmHg MR Mean grad: 86.0 mmHg     SHUNTS MR Vmax:      581.00 cm/s   Systemic VTI:  0.24 m MR Vmean:     426.0 cm/s    Systemic Diam: 2.20 cm MV E velocity: 106.50 cm/s MV A velocity: 83.55 cm/s  MV E/A ratio:  1.27 Nona Dell MD Electronically signed by Nona Dell MD Signature Date/Time: 02/19/2023/10:51:52 AM    Final     SIGNED: Kendell Bane, MD, FHM. FAAFP. [facility] - [facility] Time spent - 35 min.  In seeing, evaluating and examining the patient.  Reviewing medical records, labs, drawn plan of care. [facility],  Pager (please use [email or web address] to page/ text) Please use Epic Secure Chat for non-urgent communication (7AM-7PM)  If 7PM-7AM, please contact night-coverage [email or web address], 02/20/2023, 8:03 AM

## 2023-02-20 NOTE — Progress Notes (Addendum)
Rounding Note    Patient Name: Douglas Mason Date of Encounter: 02/20/2023  [facility] Cardiologist: Christell Constant, MD   Subjective   NPO since midnight. Requesting food. No recurrent pain. Breathing at baseline and no lower extremity edema.   Inpatient Medications    Scheduled Meds:  aspirin EC  81 mg Oral Daily   atorvastatin  80 mg Oral Daily   bisoprolol  5 mg Oral Daily   glipiZIDE  10 mg Oral Q breakfast   heparin  5,000 Units Subcutaneous Q8H   insulin aspart  0-6 Units Subcutaneous TID WC   magnesium oxide  400 mg Oral Daily   multivitamin with minerals  1 tablet Oral Daily   pantoprazole  40 mg Oral QHS   pregabalin  50 mg Oral TID   Continuous Infusions:  sodium chloride 100 mL/hr at 02/19/23 0759   sodium chloride     PRN Meds: acetaminophen, ALPRAZolam, ondansetron (ZOFRAN) IV   Vital Signs    Vitals:   02/19/23 0802 02/19/23 0957 02/20/23 0326 02/20/23 0447  BP: 138/79 (!) 147/74 (!) 103/53 120/65  Pulse: 65 65 (!) 59 65  Resp: 14 18 20    Temp: 98.2 F (36.8 C) 97.6 F (36.4 C) 97.7 F (36.5 C) 98.2 F (36.8 C)  TempSrc: Oral Oral Oral Oral  SpO2: 97% 99% 99% 96%  Weight:  107.2 kg    Height:        Intake/Output Summary (Last 24 hours) at 02/20/2023 0747 Last data filed at 02/19/2023 2001 Gross per 24 hour  Intake 1061.67 ml  Output 550 ml  Net 511.67 ml      02/19/2023    9:57 AM 02/19/2023    3:28 AM 11/12/2022   12:45 PM  Last 3 Weights  Weight (lbs) 236 lb 5.3 oz 240 lb 235 lb  Weight (kg) 107.2 kg 108.863 kg 106.595 kg      Telemetry    NSR, HR in 60's to 70's.  - Personally Reviewed  ECG    NSR, HR 60 with 1st degree AV block. No acute ST changes.  - Personally Reviewed  Physical Exam   GEN: No acute distress.   Neck: No JVD Cardiac: RRR, no murmurs, rubs, or gallops.  Respiratory: Clear to auscultation bilaterally. GI: Soft, nontender, non-distended  MS: No pitting edema; No  deformity. Neuro:  Nonfocal  Psych: Normal affect   Labs    High Sensitivity Troponin:   Recent Labs  Lab 02/19/23 0337 02/19/23 0533 02/19/23 0732 02/19/23 0924  TROPONINIHS 3 3 3 3      Chemistry Recent Labs  Lab 02/19/23 0337 02/20/23 0451  NA 136 138  K 4.4 4.5  CL 101 103  CO2 25 26  GLUCOSE 165* 133*  BUN 32* 19  CREATININE 1.60* 1.11  CALCIUM 8.6* 8.7*  GFRNONAA 45* >60  ANIONGAP 10 9    Lipids  Recent Labs  Lab 02/19/23 0732  CHOL 115  TRIG 107  HDL 27*  LDLCALC 67  CHOLHDL 4.3    Hematology Recent Labs  Lab 02/19/23 0337  WBC 10.5  RBC 3.58*  HGB 11.5*  HCT 33.8*  MCV 94.4  MCH 32.1  MCHC 34.0  RDW 15.8*  PLT 289   Thyroid No results for input(s): "TSH", "FREET4" in the last 168 hours.  BNPNo results for input(s): "BNP", "PROBNP" in the last 168 hours.  DDimer No results for input(s): "DDIMER" in the last 168 hours.   Radiology  DG Chest 2 View  Result Date: 02/19/2023 CLINICAL DATA:  Chest pain on the left EXAM: CHEST - 2 VIEW COMPARISON:  09/10/2021 FINDINGS: Cardiac shadow is mildly prominent but stable. Elevation of the right hemidiaphragm is noted. No focal infiltrate or effusion is seen. No bony abnormality is noted. IMPRESSION: No active cardiopulmonary disease. Electronically Signed   By: Alcide Clever M.D.   On: 02/19/2023 04:00    Cardiac Studies   Echocardiogram: 02/19/2023 IMPRESSIONS     1. Left ventricular ejection fraction, by estimation, is 55 to 60%. The  left ventricle has normal function. The left ventricle has no regional  wall motion abnormalities. There is mild concentric left ventricular  hypertrophy. Left ventricular diastolic  parameters were normal.   2. Right ventricular systolic function is normal. The right ventricular  size is normal. There is mildly elevated pulmonary artery systolic  pressure. The estimated right ventricular systolic pressure is 39.5 mmHg.   3. The mitral valve is grossly normal.  Trivial mitral valve  regurgitation.   4. The aortic valve is tricuspid. Aortic valve regurgitation is not  visualized.   5. The inferior vena cava is normal in size with greater than 50%  respiratory variability, suggesting right atrial pressure of 3 mmHg.   Comparison(s): Prior images reviewed side by side. LVEF remains normal  range at 55-60%.   Patient Profile     72 y.o. male w/ PMH of coronary calcification by CT (low-risk NST in 2015), PAC's, HTN, HLD, Type 2 DM and remote history of atrial flutter (occurring in 2015 and no longer on anticoagulation) who is currently admitted for evaluation of chest pain and progressive dyspnea on exertion.   Assessment & Plan    1. Chest Pain/Dyspnea on Exertion concerning for Accelerating Angina  - Presented with progressive dyspnea on exertion over the past several months which does not resemble his known asthma and has not improved with use of inhalers. Also reported chest pressure the evening of admission which radiated into his neck and left arm.  - Hs Troponin values have been negative and EKG shows slight TWI along Lead III. Echocardiogram is pending to assess for any structural abnormalities. Echo shows a preserved EF of 55-60% with no regional WMA and normal RV function. He does have mildly elevated PASP and only trivial MR.  - Given improvement in his renal function, will plan to proceed with a cardiac catheterization today. The patient understands that risks include but are not limited to stroke (1 in 1000), death (1 in 1000), kidney failure [usually temporary] (1 in 500), bleeding (1 in 200), allergic reaction [possibly serious] (1 in 200).   - Continue ASA 81mg  daily, Atorvastatin 80 mg daily and Bisoprolol 5 mg daily.   2. HTN - BP has been stable this admission, at 120/65 on most recent check. Continue Bisoprolol 5mg  daily. Losartan and Lasix were held on admission given his AKI. Can likely restart Losartan following his cath. Would  reduce Lasix from 40mg  daily to 20mg  daily at discharge given his AKI on admission.    3. HLD - LDL at 67 this admission. He was on Atorvastatin 40mg  daily PTA and this has been titrated to 80mg  daily. Repeat FLP and LFT's in 6-8 weeks.    4. Type 2 DM - Hgb A1c at 6.0 this admission. Management per the admitting team.    5. History of Atrial Flutter - Documented in 2015 and no longer on anticoagulation. No known recurrence and maintaining NSR.  6. AKI - Baseline creatinine 1.0 - 1.1. Elevated to 1.60 on admission and improved to 1.11 today after administration of IV fluids. Continue to hold Lasix until after his catheterization. He was on 40mg  daily prior to admission and would at least reduce to 20mg  daily at the time of discharge.    For questions or updates, please contact [facility] Please consult [email or web address] for contact info under        Signed, Ellsworth Lennox, PA-C  02/20/2023, 7:47 AM     Attending note:  Patient seen and examined.  I reviewed the interval chart and discussed the case with Ms. Patrick Jupiter, I agree with her above findings.  No chest pain this morning.  With hydration renal function has improved to baseline.  He is afebrile, heart rate in the 60s in sinus rhythm by telemetry.  Blood pressure 120/65.  Lungs are clear.  Cardiac exam with RRR and no gallop or significant murmur.  Pertinent lab work includes potassium 4.5, BUN 19, creatinine down to 1.11, normal high-sensitivity troponin I levels, HDL 67.  Plan is for transfer to [facility] today for a diagnostic cardiac catheterization.  Risks and benefits have been discussed and he is in agreement to proceed.  Holding metformin, losartan and Lasix, most likely will need a lower diuretic dose as an outpatient.  Otherwise continue aspirin, Lipitor, and bisoprolol.  Jonelle Sidle, M.D., F.A.C.C.

## 2023-02-20 NOTE — H&P (View-Only) (Signed)
 Rounding Note    Patient Name: Douglas Mason Date of Encounter: 02/20/2023  [facility] Cardiologist: Mahesh A Chandrasekhar, MD   Subjective   NPO since midnight. Requesting food. No recurrent pain. Breathing at baseline and no lower extremity edema.   Inpatient Medications    Scheduled Meds:  aspirin EC  81 mg Oral Daily   atorvastatin  80 mg Oral Daily   bisoprolol  5 mg Oral Daily   glipiZIDE  10 mg Oral Q breakfast   heparin  5,000 Units Subcutaneous Q8H   insulin aspart  0-6 Units Subcutaneous TID WC   magnesium oxide  400 mg Oral Daily   multivitamin with minerals  1 tablet Oral Daily   pantoprazole  40 mg Oral QHS   pregabalin  50 mg Oral TID   Continuous Infusions:  sodium chloride 100 mL/hr at 02/19/23 0759   sodium chloride     PRN Meds: acetaminophen, ALPRAZolam, ondansetron (ZOFRAN) IV   Vital Signs    Vitals:   02/19/23 0802 02/19/23 0957 02/20/23 0326 02/20/23 0447  BP: 138/79 (!) 147/74 (!) 103/53 120/65  Pulse: 65 65 (!) 59 65  Resp: 14 18 20   Temp: 98.2 F (36.8 C) 97.6 F (36.4 C) 97.7 F (36.5 C) 98.2 F (36.8 C)  TempSrc: Oral Oral Oral Oral  SpO2: 97% 99% 99% 96%  Weight:  107.2 kg    Height:        Intake/Output Summary (Last 24 hours) at 02/20/2023 0747 Last data filed at 02/19/2023 2001 Gross per 24 hour  Intake 1061.67 ml  Output 550 ml  Net 511.67 ml      02/19/2023    9:57 AM 02/19/2023    3:28 AM 11/12/2022   12:45 PM  Last 3 Weights  Weight (lbs) 236 lb 5.3 oz 240 lb 235 lb  Weight (kg) 107.2 kg 108.863 kg 106.595 kg      Telemetry    NSR, HR in 60's to 70's.  - Personally Reviewed  ECG    NSR, HR 60 with 1st degree AV block. No acute ST changes.  - Personally Reviewed  Physical Exam   GEN: No acute distress.   Neck: No JVD Cardiac: RRR, no murmurs, rubs, or gallops.  Respiratory: Clear to auscultation bilaterally. GI: Soft, nontender, non-distended  MS: No pitting edema; No  deformity. Neuro:  Nonfocal  Psych: Normal affect   Labs    High Sensitivity Troponin:   Recent Labs  Lab 02/19/23 0337 02/19/23 0533 02/19/23 0732 02/19/23 0924  TROPONINIHS 3 3 3 3     Chemistry Recent Labs  Lab 02/19/23 0337 02/20/23 0451  NA 136 138  K 4.4 4.5  CL 101 103  CO2 25 26  GLUCOSE 165* 133*  BUN 32* 19  CREATININE 1.60* 1.11  CALCIUM 8.6* 8.7*  GFRNONAA 45* >60  ANIONGAP 10 9    Lipids  Recent Labs  Lab 02/19/23 0732  CHOL 115  TRIG 107  HDL 27*  LDLCALC 67  CHOLHDL 4.3    Hematology Recent Labs  Lab 02/19/23 0337  WBC 10.5  RBC 3.58*  HGB 11.5*  HCT 33.8*  MCV 94.4  MCH 32.1  MCHC 34.0  RDW 15.8*  PLT 289   Thyroid No results for input(s): "TSH", "FREET4" in the last 168 hours.  BNPNo results for input(s): "BNP", "PROBNP" in the last 168 hours.  DDimer No results for input(s): "DDIMER" in the last 168 hours.   Radiology      DG Chest 2 View  Result Date: 02/19/2023 CLINICAL DATA:  Chest pain on the left EXAM: CHEST - 2 VIEW COMPARISON:  09/10/2021 FINDINGS: Cardiac shadow is mildly prominent but stable. Elevation of the right hemidiaphragm is noted. No focal infiltrate or effusion is seen. No bony abnormality is noted. IMPRESSION: No active cardiopulmonary disease. Electronically Signed   By: Mark  Lukens M.D.   On: 02/19/2023 04:00    Cardiac Studies   Echocardiogram: 02/19/2023 IMPRESSIONS     1. Left ventricular ejection fraction, by estimation, is 55 to 60%. The  left ventricle has normal function. The left ventricle has no regional  wall motion abnormalities. There is mild concentric left ventricular  hypertrophy. Left ventricular diastolic  parameters were normal.   2. Right ventricular systolic function is normal. The right ventricular  size is normal. There is mildly elevated pulmonary artery systolic  pressure. The estimated right ventricular systolic pressure is 39.5 mmHg.   3. The mitral valve is grossly normal.  Trivial mitral valve  regurgitation.   4. The aortic valve is tricuspid. Aortic valve regurgitation is not  visualized.   5. The inferior vena cava is normal in size with greater than 50%  respiratory variability, suggesting right atrial pressure of 3 mmHg.   Comparison(s): Prior images reviewed side by side. LVEF remains normal  range at 55-60%.   Patient Profile     71 y.o. male w/ PMH of coronary calcification by CT (low-risk NST in 2015), PAC's, HTN, HLD, Type 2 DM and remote history of atrial flutter (occurring in 2015 and no longer on anticoagulation) who is currently admitted for evaluation of chest pain and progressive dyspnea on exertion.   Assessment & Plan    1. Chest Pain/Dyspnea on Exertion concerning for Accelerating Angina  - Presented with progressive dyspnea on exertion over the past several months which does not resemble his known asthma and has not improved with use of inhalers. Also reported chest pressure the evening of admission which radiated into his neck and left arm.  - Hs Troponin values have been negative and EKG shows slight TWI along Lead III. Echocardiogram is pending to assess for any structural abnormalities. Echo shows a preserved EF of 55-60% with no regional WMA and normal RV function. He does have mildly elevated PASP and only trivial MR.  - Given improvement in his renal function, will plan to proceed with a cardiac catheterization today. The patient understands that risks include but are not limited to stroke (1 in 1000), death (1 in 1000), kidney failure [usually temporary] (1 in 500), bleeding (1 in 200), allergic reaction [possibly serious] (1 in 200).   - Continue ASA 81mg daily, Atorvastatin 80 mg daily and Bisoprolol 5 mg daily.   2. HTN - BP has been stable this admission, at 120/65 on most recent check. Continue Bisoprolol 5mg daily. Losartan and Lasix were held on admission given his AKI. Can likely restart Losartan following his cath. Would  reduce Lasix from 40mg daily to 20mg daily at discharge given his AKI on admission.    3. HLD - LDL at 67 this admission. He was on Atorvastatin 40mg daily PTA and this has been titrated to 80mg daily. Repeat FLP and LFT's in 6-8 weeks.    4. Type 2 DM - Hgb A1c at 6.0 this admission. Management per the admitting team.    5. History of Atrial Flutter - Documented in 2015 and no longer on anticoagulation. No known recurrence and maintaining NSR.      6. AKI - Baseline creatinine 1.0 - 1.1. Elevated to 1.60 on admission and improved to 1.11 today after administration of IV fluids. Continue to hold Lasix until after his catheterization. He was on 40mg daily prior to admission and would at least reduce to 20mg daily at the time of discharge.    For questions or updates, please contact [facility] Please consult [email or web address] for contact info under        Signed, Brittany M Strader, PA-C  02/20/2023, 7:47 AM     Attending note:  Patient seen and examined.  I reviewed the interval chart and discussed the case with Ms. Strader PA-C, I agree with her above findings.  No chest pain this morning.  With hydration renal function has improved to baseline.  He is afebrile, heart rate in the 60s in sinus rhythm by telemetry.  Blood pressure 120/65.  Lungs are clear.  Cardiac exam with RRR and no gallop or significant murmur.  Pertinent lab work includes potassium 4.5, BUN 19, creatinine down to 1.11, normal high-sensitivity troponin I levels, HDL 67.  Plan is for transfer to [facility] today for a diagnostic cardiac catheterization.  Risks and benefits have been discussed and he is in agreement to proceed.  Holding metformin, losartan and Lasix, most likely will need a lower diuretic dose as an outpatient.  Otherwise continue aspirin, Lipitor, and bisoprolol.  Madelein Mahadeo G. Llewellyn Choplin, M.D., F.A.C.C.   

## 2023-02-20 NOTE — Interval H&P Note (Signed)
History and Physical Interval Note:  02/20/2023 1:20 PM  Douglas Mason  has presented today for surgery, with the diagnosis of angina.  The various methods of treatment have been discussed with the patient and family. After consideration of risks, benefits and other options for treatment, the patient has consented to  Procedure(s): LEFT HEART CATH AND CORONARY ANGIOGRAPHY (N/A) as a surgical intervention.  The patient's history has been reviewed, patient examined, no change in status, stable for surgery.  I have reviewed the patient's chart and labs.  Questions were answered to the patient's satisfaction.         Orbie Pyo

## 2023-02-20 NOTE — Plan of Care (Signed)
  Problem: Education: Goal: Knowledge of General Education information will improve Description: Including pain rating scale, medication(s)/side effects and non-pharmacologic comfort measures Outcome: Progressing   Problem: Health Behavior/Discharge Planning: Goal: Ability to manage health-related needs will improve Outcome: Progressing   Problem: Clinical Measurements: Goal: Ability to maintain clinical measurements within normal limits will improve Outcome: Progressing Goal: Will remain free from infection Outcome: Progressing Goal: Diagnostic test results will improve Outcome: Progressing Goal: Respiratory complications will improve Outcome: Progressing Goal: Cardiovascular complication will be avoided Outcome: Progressing   Problem: Activity: Goal: Risk for activity intolerance will decrease Outcome: Progressing   Problem: Nutrition: Goal: Adequate nutrition will be maintained Outcome: Progressing   Problem: Coping: Goal: Level of anxiety will decrease Outcome: Progressing   Problem: Elimination: Goal: Will not experience complications related to bowel motility Outcome: Progressing Goal: Will not experience complications related to urinary retention Outcome: Progressing   Problem: Pain Managment: Goal: General experience of comfort will improve Outcome: Progressing   Problem: Safety: Goal: Ability to remain free from injury will improve Outcome: Progressing   Problem: Skin Integrity: Goal: Risk for impaired skin integrity will decrease Outcome: Progressing   Problem: Education: Goal: Understanding of cardiac disease, CV risk reduction, and recovery process will improve Outcome: Progressing Goal: Individualized Educational Video(s) Outcome: Progressing   Problem: Activity: Goal: Ability to tolerate increased activity will improve Outcome: Progressing   Problem: Cardiac: Goal: Ability to achieve and maintain adequate cardiovascular perfusion will  improve Outcome: Progressing   Problem: Health Behavior/Discharge Planning: Goal: Ability to safely manage health-related needs after discharge will improve Outcome: Progressing   Problem: Education: Goal: Ability to describe self-care measures that may prevent or decrease complications (Diabetes Survival Skills Education) will improve Outcome: Progressing Goal: Individualized Educational Video(s) Outcome: Progressing   Problem: Coping: Goal: Ability to adjust to condition or change in health will improve Outcome: Progressing   Problem: Fluid Volume: Goal: Ability to maintain a balanced intake and output will improve Outcome: Progressing   Problem: Health Behavior/Discharge Planning: Goal: Ability to identify and utilize available resources and services will improve Outcome: Progressing Goal: Ability to manage health-related needs will improve Outcome: Progressing   Problem: Metabolic: Goal: Ability to maintain appropriate glucose levels will improve Outcome: Progressing   Problem: Nutritional: Goal: Maintenance of adequate nutrition will improve Outcome: Progressing Goal: Progress toward achieving an optimal weight will improve Outcome: Progressing   Problem: Skin Integrity: Goal: Risk for impaired skin integrity will decrease Outcome: Progressing   Problem: Tissue Perfusion: Goal: Adequacy of tissue perfusion will improve Outcome: Progressing   Problem: Education: Goal: Understanding of CV disease, CV risk reduction, and recovery process will improve Outcome: Progressing Goal: Individualized Educational Video(s) Outcome: Progressing   Problem: Activity: Goal: Ability to return to baseline activity level will improve Outcome: Progressing   Problem: Cardiovascular: Goal: Ability to achieve and maintain adequate cardiovascular perfusion will improve Outcome: Progressing Goal: Vascular access site(s) Level 0-1 will be maintained Outcome: Progressing    Problem: Health Behavior/Discharge Planning: Goal: Ability to safely manage health-related needs after discharge will improve Outcome: Progressing   

## 2023-02-21 ENCOUNTER — Other Ambulatory Visit (HOSPITAL_COMMUNITY): Payer: Self-pay

## 2023-02-21 ENCOUNTER — Encounter (HOSPITAL_COMMUNITY): Payer: Self-pay | Admitting: Internal Medicine

## 2023-02-21 DIAGNOSIS — Z79899 Other long term (current) drug therapy: Secondary | ICD-10-CM | POA: Diagnosis not present

## 2023-02-21 DIAGNOSIS — I200 Unstable angina: Secondary | ICD-10-CM | POA: Diagnosis not present

## 2023-02-21 DIAGNOSIS — Z7984 Long term (current) use of oral hypoglycemic drugs: Secondary | ICD-10-CM | POA: Diagnosis not present

## 2023-02-21 DIAGNOSIS — E119 Type 2 diabetes mellitus without complications: Secondary | ICD-10-CM | POA: Diagnosis present

## 2023-02-21 DIAGNOSIS — I25110 Atherosclerotic heart disease of native coronary artery with unstable angina pectoris: Secondary | ICD-10-CM | POA: Diagnosis present

## 2023-02-21 DIAGNOSIS — R0789 Other chest pain: Secondary | ICD-10-CM | POA: Diagnosis present

## 2023-02-21 DIAGNOSIS — Z806 Family history of leukemia: Secondary | ICD-10-CM | POA: Diagnosis not present

## 2023-02-21 DIAGNOSIS — K219 Gastro-esophageal reflux disease without esophagitis: Secondary | ICD-10-CM | POA: Diagnosis present

## 2023-02-21 DIAGNOSIS — E7800 Pure hypercholesterolemia, unspecified: Secondary | ICD-10-CM | POA: Diagnosis present

## 2023-02-21 DIAGNOSIS — G4733 Obstructive sleep apnea (adult) (pediatric): Secondary | ICD-10-CM | POA: Diagnosis present

## 2023-02-21 DIAGNOSIS — F419 Anxiety disorder, unspecified: Secondary | ICD-10-CM | POA: Diagnosis present

## 2023-02-21 DIAGNOSIS — Z87891 Personal history of nicotine dependence: Secondary | ICD-10-CM | POA: Diagnosis not present

## 2023-02-21 DIAGNOSIS — Z7982 Long term (current) use of aspirin: Secondary | ICD-10-CM | POA: Diagnosis not present

## 2023-02-21 DIAGNOSIS — R079 Chest pain, unspecified: Secondary | ICD-10-CM | POA: Diagnosis present

## 2023-02-21 DIAGNOSIS — I440 Atrioventricular block, first degree: Secondary | ICD-10-CM | POA: Diagnosis present

## 2023-02-21 DIAGNOSIS — Z825 Family history of asthma and other chronic lower respiratory diseases: Secondary | ICD-10-CM | POA: Diagnosis not present

## 2023-02-21 DIAGNOSIS — I10 Essential (primary) hypertension: Secondary | ICD-10-CM | POA: Diagnosis present

## 2023-02-21 DIAGNOSIS — N179 Acute kidney failure, unspecified: Secondary | ICD-10-CM | POA: Diagnosis present

## 2023-02-21 DIAGNOSIS — J45909 Unspecified asthma, uncomplicated: Secondary | ICD-10-CM | POA: Diagnosis present

## 2023-02-21 DIAGNOSIS — Z8261 Family history of arthritis: Secondary | ICD-10-CM | POA: Diagnosis not present

## 2023-02-21 DIAGNOSIS — Z794 Long term (current) use of insulin: Secondary | ICD-10-CM | POA: Diagnosis not present

## 2023-02-21 LAB — CBC
HCT: 33.3 % — ABNORMAL LOW (ref 39.0–52.0)
Hemoglobin: 11.2 g/dL — ABNORMAL LOW (ref 13.0–17.0)
MCH: 31.8 pg (ref 26.0–34.0)
MCHC: 33.6 g/dL (ref 30.0–36.0)
MCV: 94.6 fL (ref 80.0–100.0)
Platelets: 300 10*3/uL (ref 150–400)
RBC: 3.52 MIL/uL — ABNORMAL LOW (ref 4.22–5.81)
RDW: 15.7 % — ABNORMAL HIGH (ref 11.5–15.5)
WBC: 9.2 10*3/uL (ref 4.0–10.5)
nRBC: 0 % (ref 0.0–0.2)

## 2023-02-21 LAB — BASIC METABOLIC PANEL
Anion gap: 9 (ref 5–15)
BUN: 13 mg/dL (ref 8–23)
CO2: 25 mmol/L (ref 22–32)
Calcium: 9.1 mg/dL (ref 8.9–10.3)
Chloride: 106 mmol/L (ref 98–111)
Creatinine, Ser: 1.03 mg/dL (ref 0.61–1.24)
GFR, Estimated: >60 mL/min (ref >60)
Glucose, Bld: 132 mg/dL — ABNORMAL HIGH (ref 70–99)
Potassium: 4.5 mmol/L (ref 3.5–5.1)
Sodium: 140 mmol/L (ref 135–145)

## 2023-02-21 LAB — GLUCOSE, CAPILLARY: Glucose-Capillary: 153 mg/dL — ABNORMAL HIGH (ref 70–99)

## 2023-02-21 MED ORDER — ASPIRIN 81 MG PO TBEC
81.0000 mg | DELAYED_RELEASE_TABLET | Freq: Every day | ORAL | 0 refills | Status: AC
  Filled 2023-02-21: qty 120, 120d supply, fill #0

## 2023-02-21 MED ORDER — BISOPROLOL FUMARATE 5 MG PO TABS
5.0000 mg | ORAL_TABLET | Freq: Every day | ORAL | 1 refills | Status: DC
  Filled 2023-02-21: qty 30, 30d supply, fill #0

## 2023-02-21 MED ORDER — ATORVASTATIN CALCIUM 40 MG PO TABS
40.0000 mg | ORAL_TABLET | Freq: Every day | ORAL | 1 refills | Status: DC
  Filled 2023-02-21: qty 30, 30d supply, fill #0

## 2023-02-21 MED ORDER — TICAGRELOR 90 MG PO TABS
90.0000 mg | ORAL_TABLET | Freq: Two times a day (BID) | ORAL | 1 refills | Status: DC
  Filled 2023-02-21: qty 60, 30d supply, fill #0

## 2023-02-21 MED ORDER — LOSARTAN POTASSIUM 25 MG PO TABS
25.0000 mg | ORAL_TABLET | Freq: Every day | ORAL | 1 refills | Status: DC
  Filled 2023-02-21: qty 30, 30d supply, fill #0

## 2023-02-21 MED ORDER — ATORVASTATIN CALCIUM 80 MG PO TABS
80.0000 mg | ORAL_TABLET | Freq: Every day | ORAL | 11 refills | Status: AC
  Filled 2023-02-21: qty 30, 30d supply, fill #0

## 2023-02-21 MED FILL — Nitroglycerin IV Soln 100 MCG/ML in D5W: INTRA_ARTERIAL | Qty: 10 | Status: AC

## 2023-02-21 NOTE — Plan of Care (Signed)
  Problem: Education: Goal: Knowledge of General Education information will improve Description: Including pain rating scale, medication(s)/side effects and non-pharmacologic comfort measures Outcome: Progressing   Problem: Health Behavior/Discharge Planning: Goal: Ability to manage health-related needs will improve Outcome: Progressing   Problem: Clinical Measurements: Goal: Ability to maintain clinical measurements within normal limits will improve Outcome: Progressing Goal: Will remain free from infection Outcome: Progressing Goal: Diagnostic test results will improve Outcome: Progressing Goal: Respiratory complications will improve Outcome: Progressing Goal: Cardiovascular complication will be avoided Outcome: Progressing   Problem: Activity: Goal: Risk for activity intolerance will decrease Outcome: Progressing   Problem: Nutrition: Goal: Adequate nutrition will be maintained Outcome: Progressing   Problem: Coping: Goal: Level of anxiety will decrease Outcome: Progressing   Problem: Elimination: Goal: Will not experience complications related to bowel motility Outcome: Progressing Goal: Will not experience complications related to urinary retention Outcome: Progressing   Problem: Pain Managment: Goal: General experience of comfort will improve Outcome: Progressing   Problem: Safety: Goal: Ability to remain free from injury will improve Outcome: Progressing   Problem: Skin Integrity: Goal: Risk for impaired skin integrity will decrease Outcome: Progressing   Problem: Education: Goal: Understanding of cardiac disease, CV risk reduction, and recovery process will improve Outcome: Progressing Goal: Individualized Educational Video(s) Outcome: Progressing   Problem: Activity: Goal: Ability to tolerate increased activity will improve Outcome: Progressing   Problem: Cardiac: Goal: Ability to achieve and maintain adequate cardiovascular perfusion will  improve Outcome: Progressing   Problem: Health Behavior/Discharge Planning: Goal: Ability to safely manage health-related needs after discharge will improve Outcome: Progressing   Problem: Education: Goal: Ability to describe self-care measures that may prevent or decrease complications (Diabetes Survival Skills Education) will improve Outcome: Progressing Goal: Individualized Educational Video(s) Outcome: Progressing   Problem: Coping: Goal: Ability to adjust to condition or change in health will improve Outcome: Progressing   Problem: Fluid Volume: Goal: Ability to maintain a balanced intake and output will improve Outcome: Progressing   Problem: Health Behavior/Discharge Planning: Goal: Ability to identify and utilize available resources and services will improve Outcome: Progressing Goal: Ability to manage health-related needs will improve Outcome: Progressing   Problem: Metabolic: Goal: Ability to maintain appropriate glucose levels will improve Outcome: Progressing   Problem: Nutritional: Goal: Maintenance of adequate nutrition will improve Outcome: Progressing Goal: Progress toward achieving an optimal weight will improve Outcome: Progressing   Problem: Skin Integrity: Goal: Risk for impaired skin integrity will decrease Outcome: Progressing   Problem: Tissue Perfusion: Goal: Adequacy of tissue perfusion will improve Outcome: Progressing   Problem: Education: Goal: Understanding of CV disease, CV risk reduction, and recovery process will improve Outcome: Progressing Goal: Individualized Educational Video(s) Outcome: Progressing   Problem: Activity: Goal: Ability to return to baseline activity level will improve Outcome: Progressing   Problem: Cardiovascular: Goal: Ability to achieve and maintain adequate cardiovascular perfusion will improve Outcome: Progressing Goal: Vascular access site(s) Level 0-1 will be maintained Outcome: Progressing    Problem: Health Behavior/Discharge Planning: Goal: Ability to safely manage health-related needs after discharge will improve Outcome: Progressing   

## 2023-02-21 NOTE — TOC Transition Note (Signed)
Transition of Care [facility]) - CM/SW Discharge Note   Patient Details  Name: Douglas Mason MRN: [id number] Date of Birth: Nov 07, 1950  Transition of Care [facility]) CM/SW Contact:  Lawerance Sabal, RN Phone Number: 02/21/2023, 10:32 AM   Clinical Narrative:     Sherron Monday w patient at bedside reviewed Brilinta with him, meds through [facility] pharmacy.  No other TOC needs identified at this time for DC   Final next level of care: Home/Self Care Barriers to Discharge: No Barriers Identified   Patient Goals and CMS Choice      Discharge Placement                         Discharge Plan and Services Additional resources added to the After Visit Summary for                  DME Arranged: N/A           HH Agency: NA        Social Determinants of Health (SDOH) Interventions SDOH Screenings   Food Insecurity: No Food Insecurity (02/19/2023)  Housing: Low Risk  (02/19/2023)  Transportation Needs: No Transportation Needs (02/19/2023)  Utilities: Not At Risk (02/19/2023)  Tobacco Use: Medium Risk (02/21/2023)     Readmission Risk Interventions     No data to display

## 2023-02-21 NOTE — Progress Notes (Signed)
   Patient Name: Douglas Mason Date of Encounter: 02/21/2023 [facility] Cardiologist: Christell Constant, MD   Interval Summary  .    No chest pain. No events overnight.   Vital Signs .    Vitals:   02/20/23 1635 02/20/23 1930 02/20/23 2314 02/21/23 0519  BP: (!) 144/0 (!) 142/69 (!) 142/80 (!) 144/71  Pulse: 66 69 63 60  Resp: 20 18 20  (!) 21  Temp: (!) 97.5 F (36.4 C) 98 F (36.7 C) 97.7 F (36.5 C) 97.6 F (36.4 C)  TempSrc: Oral Oral Oral Oral  SpO2: 99% 97% 97% 98%  Weight:      Height:        Intake/Output Summary (Last 24 hours) at 02/21/2023 0921 Last data filed at 02/21/2023 0521 Gross per 24 hour  Intake 1909.53 ml  Output 1050 ml  Net 859.53 ml      02/20/2023   12:19 PM 02/19/2023    9:57 AM 02/19/2023    3:28 AM  Last 3 Weights  Weight (lbs) 236 lb 5.3 oz 236 lb 5.3 oz 240 lb  Weight (kg) 107.2 kg 107.2 kg 108.863 kg      Telemetry/ECG    Sinus - Personally Reviewed  Physical Exam .   GEN: No acute distress.   Neck: No JVD Cardiac: RRR, no murmurs, rubs, or gallops.  Respiratory: Clear to auscultation bilaterally. GI: Soft, nontender, non-distended  MS: No edema  Assessment & Plan .     72 yo male with history of CAD, HTN, HLD, DM, remote atrial flutter who was admitted to [facility] with chest pain. Troponin negative. Cardiac cath 02/20/23 with severe mid LAD stenosis treated with a drug eluting stent.   CAD/Unstable angina: He is doing well today post PCI of the LAD with placement of a single drug eluting stent. Will plan to continue DAPT with ASA/Brilinta for at least six months but up to one year if he tolerates. Continue statin and beta blocker. OK to discharge home today.   We will arrange follow up in our office. ([place] office)   For questions or updates, please contact [facility] Please consult [email or web address] for contact info under        Signed, Verne Carrow, MD

## 2023-02-21 NOTE — Plan of Care (Signed)
Problem: Education: Goal: Knowledge of General Education information will improve Description: Including pain rating scale, medication(s)/side effects and non-pharmacologic comfort measures 02/21/2023 1035 by Herma Carson, RN Outcome: Adequate for Discharge 02/21/2023 0818 by Herma Carson, RN Outcome: Progressing   Problem: Health Behavior/Discharge Planning: Goal: Ability to manage health-related needs will improve 02/21/2023 1035 by Herma Carson, RN Outcome: Adequate for Discharge 02/21/2023 0818 by Herma Carson, RN Outcome: Progressing   Problem: Clinical Measurements: Goal: Ability to maintain clinical measurements within normal limits will improve 02/21/2023 1035 by Herma Carson, RN Outcome: Adequate for Discharge 02/21/2023 0818 by Herma Carson, RN Outcome: Progressing Goal: Will remain free from infection 02/21/2023 1035 by Herma Carson, RN Outcome: Adequate for Discharge 02/21/2023 0818 by Herma Carson, RN Outcome: Progressing Goal: Diagnostic test results will improve 02/21/2023 1035 by Herma Carson, RN Outcome: Adequate for Discharge 02/21/2023 0818 by Herma Carson, RN Outcome: Progressing Goal: Respiratory complications will improve 02/21/2023 1035 by Herma Carson, RN Outcome: Adequate for Discharge 02/21/2023 0818 by Herma Carson, RN Outcome: Progressing Goal: Cardiovascular complication will be avoided 02/21/2023 1035 by Herma Carson, RN Outcome: Adequate for Discharge 02/21/2023 0818 by Herma Carson, RN Outcome: Progressing   Problem: Activity: Goal: Risk for activity intolerance will decrease 02/21/2023 1035 by Herma Carson, RN Outcome: Adequate for Discharge 02/21/2023 0818 by Herma Carson, RN Outcome: Progressing   Problem: Nutrition: Goal: Adequate nutrition will be maintained 02/21/2023 1035 by Herma Carson, RN Outcome: Adequate for Discharge 02/21/2023 0818 by Herma Carson, RN Outcome: Progressing    Problem: Coping: Goal: Level of anxiety will decrease 02/21/2023 1035 by Herma Carson, RN Outcome: Adequate for Discharge 02/21/2023 0818 by Herma Carson, RN Outcome: Progressing   Problem: Elimination: Goal: Will not experience complications related to bowel motility 02/21/2023 1035 by Herma Carson, RN Outcome: Adequate for Discharge 02/21/2023 0818 by Herma Carson, RN Outcome: Progressing Goal: Will not experience complications related to urinary retention 02/21/2023 1035 by Herma Carson, RN Outcome: Adequate for Discharge 02/21/2023 0818 by Herma Carson, RN Outcome: Progressing   Problem: Pain Managment: Goal: General experience of comfort will improve 02/21/2023 1035 by Herma Carson, RN Outcome: Adequate for Discharge 02/21/2023 0818 by Herma Carson, RN Outcome: Progressing   Problem: Safety: Goal: Ability to remain free from injury will improve 02/21/2023 1035 by Herma Carson, RN Outcome: Adequate for Discharge 02/21/2023 0818 by Herma Carson, RN Outcome: Progressing   Problem: Skin Integrity: Goal: Risk for impaired skin integrity will decrease 02/21/2023 1035 by Herma Carson, RN Outcome: Adequate for Discharge 02/21/2023 0818 by Herma Carson, RN Outcome: Progressing   Problem: Education: Goal: Understanding of cardiac disease, CV risk reduction, and recovery process will improve 02/21/2023 1035 by Herma Carson, RN Outcome: Adequate for Discharge 02/21/2023 0818 by Herma Carson, RN Outcome: Progressing Goal: Individualized Educational Video(s) 02/21/2023 1035 by Herma Carson, RN Outcome: Adequate for Discharge 02/21/2023 0818 by Herma Carson, RN Outcome: Progressing   Problem: Activity: Goal: Ability to tolerate increased activity will improve 02/21/2023 1035 by Herma Carson, RN Outcome: Adequate for Discharge 02/21/2023 0818 by Herma Carson, RN Outcome: Progressing   Problem: Cardiac: Goal: Ability to achieve and  maintain adequate cardiovascular perfusion will improve 02/21/2023 1035 by Herma Carson, RN Outcome: Adequate for Discharge 02/21/2023 0818 by Herma Carson, RN Outcome: Progressing   Problem: Health Behavior/Discharge  Planning: Goal: Ability to safely manage health-related needs after discharge will improve 02/21/2023 1035 by Herma Carson, RN Outcome: Adequate for Discharge 02/21/2023 0818 by Herma Carson, RN Outcome: Progressing   Problem: Education: Goal: Ability to describe self-care measures that may prevent or decrease complications (Diabetes Survival Skills Education) will improve 02/21/2023 1035 by Herma Carson, RN Outcome: Adequate for Discharge 02/21/2023 0818 by Herma Carson, RN Outcome: Progressing Goal: Individualized Educational Video(s) 02/21/2023 1035 by Herma Carson, RN Outcome: Adequate for Discharge 02/21/2023 0818 by Herma Carson, RN Outcome: Progressing   Problem: Coping: Goal: Ability to adjust to condition or change in health will improve 02/21/2023 1035 by Herma Carson, RN Outcome: Adequate for Discharge 02/21/2023 0818 by Herma Carson, RN Outcome: Progressing   Problem: Fluid Volume: Goal: Ability to maintain a balanced intake and output will improve 02/21/2023 1035 by Herma Carson, RN Outcome: Adequate for Discharge 02/21/2023 0818 by Herma Carson, RN Outcome: Progressing   Problem: Health Behavior/Discharge Planning: Goal: Ability to identify and utilize available resources and services will improve 02/21/2023 1035 by Herma Carson, RN Outcome: Adequate for Discharge 02/21/2023 0818 by Herma Carson, RN Outcome: Progressing Goal: Ability to manage health-related needs will improve 02/21/2023 1035 by Herma Carson, RN Outcome: Adequate for Discharge 02/21/2023 0818 by Herma Carson, RN Outcome: Progressing   Problem: Metabolic: Goal: Ability to maintain appropriate glucose levels will improve 02/21/2023 1035 by  Herma Carson, RN Outcome: Adequate for Discharge 02/21/2023 0818 by Herma Carson, RN Outcome: Progressing   Problem: Nutritional: Goal: Maintenance of adequate nutrition will improve 02/21/2023 1035 by Herma Carson, RN Outcome: Adequate for Discharge 02/21/2023 0818 by Herma Carson, RN Outcome: Progressing Goal: Progress toward achieving an optimal weight will improve 02/21/2023 1035 by Herma Carson, RN Outcome: Adequate for Discharge 02/21/2023 0818 by Herma Carson, RN Outcome: Progressing   Problem: Skin Integrity: Goal: Risk for impaired skin integrity will decrease 02/21/2023 1035 by Herma Carson, RN Outcome: Adequate for Discharge 02/21/2023 0818 by Herma Carson, RN Outcome: Progressing   Problem: Tissue Perfusion: Goal: Adequacy of tissue perfusion will improve 02/21/2023 1035 by Herma Carson, RN Outcome: Adequate for Discharge 02/21/2023 0818 by Herma Carson, RN Outcome: Progressing   Problem: Education: Goal: Understanding of CV disease, CV risk reduction, and recovery process will improve 02/21/2023 1035 by Herma Carson, RN Outcome: Adequate for Discharge 02/21/2023 0818 by Herma Carson, RN Outcome: Progressing Goal: Individualized Educational Video(s) 02/21/2023 1035 by Herma Carson, RN Outcome: Adequate for Discharge 02/21/2023 0818 by Herma Carson, RN Outcome: Progressing   Problem: Activity: Goal: Ability to return to baseline activity level will improve 02/21/2023 1035 by Herma Carson, RN Outcome: Adequate for Discharge 02/21/2023 0818 by Herma Carson, RN Outcome: Progressing   Problem: Cardiovascular: Goal: Ability to achieve and maintain adequate cardiovascular perfusion will improve 02/21/2023 1035 by Herma Carson, RN Outcome: Adequate for Discharge 02/21/2023 0818 by Herma Carson, RN Outcome: Progressing Goal: Vascular access site(s) Level 0-1 will be maintained 02/21/2023 1035 by Herma Carson,  RN Outcome: Adequate for Discharge 02/21/2023 0818 by Herma Carson, RN Outcome: Progressing   Problem: Health Behavior/Discharge Planning: Goal: Ability to safely manage health-related needs after discharge will improve 02/21/2023 1035 by Herma Carson, RN Outcome: Adequate for Discharge 02/21/2023 0818 by Herma Carson, RN Outcome: Progressing

## 2023-02-21 NOTE — Discharge Summary (Addendum)
Physician Discharge Summary  Douglas Mason GUY:[id number] DOB: 07/27/1951 DOA: 02/19/2023  PCP: Benita Stabile, MD  Admit date: 02/19/2023 Discharge date: 02/21/2023  Admitted From: Home Disposition:  Home  Discharge Condition:Stable CODE STATUS:FULL Diet recommendation: Heart Healthy  Brief/Interim Summary: Patient is a 72 year old male with past medical history of HTN, HLD, type II DM, OSA, anxiety, presenting with left-sided chest pain. With radiation to neck and left arm. Started last night before going to bed associate with some nausea. Not associated with shortness of breath, cough or fever .  He was sent to [facility] for cardiac cath which showed 85% stenosis at mid LAD, status post drug-eluting stent placement by cardiology.  Recommendation is to continue DAPT for 6 months to 1 year.  He is chest pain free.  Cardiology cleared for discharge today.  Following problems were addressed during the hospitalization:  Atypical chest pain -Plan as above -He will follow-up with cardiology as an outpatient.  AKI -resolved  -Lasix discontinued.  Will resume losartan at low-dose.   Anxiety - Currently stable, continuing home medication of Xanax as needed   OSA Continue home CPAP   GERD  - Continue PPI  Essential hypertension - Currently stable, resuming home medication -Continue bisoprolol, losartan   Diabetes mellitus type II, non insulin dependent Continue home medication at discharge.  Blood sugars need to be monitored at home   Discharge Diagnoses:  Principal Problem:   Atypical chest pain Active Problems:   Chest pain with moderate risk of acute coronary syndrome   AKI (acute kidney injury) (HCC)   Essential hypertension   GERD (gastroesophageal reflux disease)   OSA (obstructive sleep apnea)   Anxiety   Diabetes mellitus type II, non insulin dependent (HCC)   Unstable angina (HCC)   Chest pain    Discharge Instructions  Discharge Instructions     Amb  Referral to Cardiac Rehabilitation   Complete by: As directed    Diagnosis: Coronary Stents   After initial evaluation and assessments completed: Virtual Based Care may be provided alone or in conjunction with Phase 2 Cardiac Rehab based on patient barriers.: Yes   Intensive Cardiac Rehabilitation (ICR) [facility] location only OR Traditional Cardiac Rehabilitation (TCR) *If criteria for ICR are not met will enroll in TCR [facility] only): Yes   Diet - low sodium heart healthy   Complete by: As directed    Discharge instructions   Complete by: As directed    1)Please take prescribed medications as instructed 2)Follow up with your PCP in a week 3)You will be called by cardiology for follow up appointment   Increase activity slowly   Complete by: As directed       Allergies as of 02/21/2023       Reactions   Lisinopril Cough        Medication List     STOP taking these medications    furosemide 40 MG tablet Commonly known as: LASIX   potassium chloride SA 20 MEQ tablet Commonly known as: KLOR-CON M       TAKE these medications    albuterol 108 (90 Base) MCG/ACT inhaler Commonly known as: VENTOLIN HFA Inhale 2 puffs into the lungs every 4 (four) hours as needed for wheezing or shortness of breath.   albuterol (2.5 MG/3ML) 0.083% nebulizer solution Commonly known as: PROVENTIL Take 3 mLs (2.5 mg total) by nebulization every 6 (six) hours as needed for wheezing or shortness of breath.   ALPRAZolam 0.5 MG tablet Commonly known  as: XANAX Take 0.5 mg by mouth at bedtime as needed for anxiety.   aspirin EC 81 MG tablet Take 1 tablet (81 mg total) by mouth daily.   atorvastatin 80 MG tablet Commonly known as: LIPITOR Take 1 tablet (80 mg total) by mouth daily. Start taking on: February 22, 2023 What changed:  medication strength how much to take   bisoprolol 5 MG tablet Commonly known as: ZEBETA Take 1 tablet (5 mg total) by mouth daily.   glipiZIDE 10 MG 24 hr  tablet Commonly known as: GLUCOTROL XL Take 1 tablet by mouth 2 (two) times a day.   HYDROcodone-acetaminophen 5-325 MG tablet Commonly known as: NORCO/VICODIN Take 1 tablet by mouth at bedtime as needed for pain.   Jardiance 25 MG Tabs tablet Generic drug: empagliflozin Take 25 mg by mouth daily.   losartan 25 MG tablet Commonly known as: COZAAR Take 1 tablet (25 mg total) by mouth daily. What changed:  medication strength how much to take   Magnesium Oxide 400 MG Caps Take 1 capsule (400 mg total) by mouth daily.   metFORMIN 500 MG 24 hr tablet Commonly known as: GLUCOPHAGE-XR Take 2 tablets (1,000 mg total) by mouth 2 (two) times a day.   multivitamins ther. w/minerals Tabs tablet Take 1 tablet by mouth daily.   pantoprazole 40 MG tablet Commonly known as: PROTONIX Take 1 tablet (40 mg total) by mouth 2 (two) times daily before a meal. What changed: when to take this   pregabalin 50 MG capsule Commonly known as: LYRICA Take 50 mg by mouth 3 (three) times daily.   ticagrelor 90 MG Tabs tablet Commonly known as: BRILINTA Take 1 tablet (90 mg total) by mouth 2 (two) times daily.   Toujeo SoloStar 300 UNIT/ML Solostar Pen Generic drug: insulin glargine (1 Unit Dial) Inject 12 Units into the skin daily.        Follow-up Information     Benita Stabile, MD. Schedule an appointment as soon as possible for a visit in 1 week(s).   Specialty: Internal Medicine Contact information: [address] [place] [address] [phone]                Allergies  Allergen Reactions   Lisinopril Cough    Consultations: Cardiology   Procedures/Studies: CARDIAC CATHETERIZATION  Result Date: 02/20/2023   Lat 1st Mrg lesion is 50% stenosed.   Mid LAD lesion is 85% stenosed.   A stent was successfully placed.   Post intervention, there is a 0% residual stenosis. 1.  High-grade mid LAD lesion treated with OCT guided and optimized PCI with 1 drug-eluting  stent. 2.  LVEDP of 19 mmHg. Recommendation: Overnight observation, dual antiplatelet therapy at least 6 months and preferably 1 year with aggressive cardiovascular risk factor modification.   ECHOCARDIOGRAM COMPLETE  Result Date: 02/19/2023    ECHOCARDIOGRAM REPORT   Patient Name:   Douglas Mason Date of Exam: 02/19/2023 Medical Rec #:  [id number]         Height:       72.0 in Accession #:    [id number]        Weight:       240.0 lb Date of Birth:  1950-12-29        BSA:          2.302 m Patient Age:    71 years          BP:  138/79 mmHg Patient Gender: M                 HR:           65 bpm. [facility] Procedure: 2D Echo, Cardiac Doppler and Color Doppler Indications:    Chest Pain R07.9  History:        Patient has prior history of Echocardiogram examinations, most                 recent 11/23/2021. Arrythmias:Atrial Flutter; Risk                 Factors:Hypertension, Diabetes and Dyslipidemia.  Sonographer:    Celesta Gentile RCS Referring Phys: [phone] SEYED A SHAHMEHDI IMPRESSIONS  1. Left ventricular ejection fraction, by estimation, is 55 to 60%. The left ventricle has normal function. The left ventricle has no regional wall motion abnormalities. There is mild concentric left ventricular hypertrophy. Left ventricular diastolic parameters were normal.  2. Right ventricular systolic function is normal. The right ventricular size is normal. There is mildly elevated pulmonary artery systolic pressure. The estimated right ventricular systolic pressure is 39.5 mmHg.  3. The mitral valve is grossly normal. Trivial mitral valve regurgitation.  4. The aortic valve is tricuspid. Aortic valve regurgitation is not visualized.  5. The inferior vena cava is normal in size with greater than 50% respiratory variability, suggesting right atrial pressure of 3 mmHg. Comparison(s): Prior images reviewed side by side. LVEF remains normal range at 55-60%. FINDINGS  Left Ventricle: Left ventricular  ejection fraction, by estimation, is 55 to 60%. The left ventricle has normal function. The left ventricle has no regional wall motion abnormalities. The left ventricular internal cavity size was normal in size. There is  mild concentric left ventricular hypertrophy. Left ventricular diastolic parameters were normal. Right Ventricle: The right ventricular size is normal. No increase in right ventricular wall thickness. Right ventricular systolic function is normal. There is mildly elevated pulmonary artery systolic pressure. The tricuspid regurgitant velocity is 3.02  m/s, and with an assumed right atrial pressure of 3 mmHg, the estimated right ventricular systolic pressure is 39.5 mmHg. Left Atrium: Left atrial size was normal in size. Right Atrium: Right atrial size was normal in size. Pericardium: There is no evidence of pericardial effusion. Presence of epicardial fat layer. Mitral Valve: The mitral valve is grossly normal. Trivial mitral valve regurgitation. Tricuspid Valve: The tricuspid valve is grossly normal. Tricuspid valve regurgitation is mild. Aortic Valve: The aortic valve is tricuspid. There is mild aortic valve annular calcification. Aortic valve regurgitation is not visualized. Pulmonic Valve: The pulmonic valve was grossly normal. Pulmonic valve regurgitation is trivial. Aorta: The aortic root is normal in size and structure. Venous: The inferior vena cava is normal in size with greater than 50% respiratory variability, suggesting right atrial pressure of 3 mmHg. IAS/Shunts: No atrial level shunt detected by color flow Doppler.  LEFT VENTRICLE PLAX 2D LVIDd:         5.20 cm   Diastology LVIDs:         2.80 cm   LV e' medial:    9.03 cm/s LV PW:         1.30 cm   LV E/e' medial:  11.8 LV IVS:        1.20 cm   LV e' lateral:   12.00 cm/s LVOT diam:     2.20 cm   LV E/e' lateral: 8.9 LV SV:  93 LV SV Index:   40 LVOT Area:     3.80 cm  RIGHT VENTRICLE RV S prime:     13.90 cm/s TAPSE (M-mode):  2.4 cm LEFT ATRIUM             Index        RIGHT ATRIUM           Index LA diam:        4.20 cm 1.82 cm/m   RA Area:     18.30 cm LA Vol (A2C):   49.1 ml 21.33 ml/m  RA Volume:   50.70 ml  22.02 ml/m LA Vol (A4C):   55.4 ml 24.06 ml/m LA Biplane Vol: 58.1 ml 25.24 ml/m  AORTIC VALVE LVOT Vmax:   121.00 cm/s LVOT Vmean:  78.100 cm/s LVOT VTI:    0.244 m  AORTA Ao Root diam: 3.80 cm MITRAL VALVE                TRICUSPID VALVE MV Area (PHT): 2.80 cm     TR Peak grad:   36.5 mmHg MV Decel Time: 271 msec     TR Vmax:        302.00 cm/s MR Peak grad: 135.0 mmHg MR Mean grad: 86.0 mmHg     SHUNTS MR Vmax:      581.00 cm/s   Systemic VTI:  0.24 m MR Vmean:     426.0 cm/s    Systemic Diam: 2.20 cm MV E velocity: 106.50 cm/s MV A velocity: 83.55 cm/s MV E/A ratio:  1.27 Nona Dell MD Electronically signed by Nona Dell MD Signature Date/Time: 02/19/2023/10:51:52 AM    Final    DG Chest 2 View  Result Date: 02/19/2023 CLINICAL DATA:  Chest pain on the left EXAM: CHEST - 2 VIEW COMPARISON:  09/10/2021 FINDINGS: Cardiac shadow is mildly prominent but stable. Elevation of the right hemidiaphragm is noted. No focal infiltrate or effusion is seen. No bony abnormality is noted. IMPRESSION: No active cardiopulmonary disease. Electronically Signed   By: Alcide Clever M.D.   On: 02/19/2023 04:00      Subjective: Patient seen and examined at bedside today.  Hemodynamically stable for discharge.  Comfortable, denies chest pain.  Very eager to go home  Discharge Exam: Vitals:   02/20/23 2314 02/21/23 0519  BP: (!) 142/80 (!) 144/71  Pulse: 63 60  Resp: 20 (!) 21  Temp: 97.7 F (36.5 C) 97.6 F (36.4 C)  SpO2: 97% 98%   Vitals:   02/20/23 1635 02/20/23 1930 02/20/23 2314 02/21/23 0519  BP: (!) 144/0 (!) 142/69 (!) 142/80 (!) 144/71  Pulse: 66 69 63 60  Resp: 20 18 20  (!) 21  Temp: (!) 97.5 F (36.4 C) 98 F (36.7 C) 97.7 F (36.5 C) 97.6 F (36.4 C)  TempSrc: Oral Oral Oral Oral  SpO2: 99%  97% 97% 98%  Weight:      Height:        General: Pt is alert, awake, not in acute distress Cardiovascular: RRR, S1/S2 +, no rubs, no gallops Respiratory: CTA bilaterally, no wheezing, no rhonchi Abdominal: Soft, NT, ND, bowel sounds + Extremities: no edema, no cyanosis    The results of significant diagnostics from this hospitalization (including imaging, microbiology, ancillary and laboratory) are listed below for reference.     Microbiology: No results found for this or any previous visit (from the past 240 hour(s)).   Labs: BNP (last 3 results) Recent Labs    11/12/22  1403  BNP 17.0   Basic Metabolic Panel: Recent Labs  Lab 02/19/23 0337 02/20/23 0451 02/21/23 0255  NA 136 138 140  K 4.4 4.5 4.5  CL 101 103 106  CO2 25 26 25   GLUCOSE 165* 133* 132*  BUN 32* 19 13  CREATININE 1.60* 1.11 1.03  CALCIUM 8.6* 8.7* 9.1   Liver Function Tests: No results for input(s): "AST", "ALT", "ALKPHOS", "BILITOT", "PROT", "ALBUMIN" in the last 168 hours. No results for input(s): "LIPASE", "AMYLASE" in the last 168 hours. No results for input(s): "AMMONIA" in the last 168 hours. CBC: Recent Labs  Lab 02/19/23 0337 02/21/23 0255  WBC 10.5 9.2  HGB 11.5* 11.2*  HCT 33.8* 33.3*  MCV 94.4 94.6  PLT 289 300   Cardiac Enzymes: No results for input(s): "CKTOTAL", "CKMB", "CKMBINDEX", "TROPONINI" in the last 168 hours. BNP: Invalid input(s): "POCBNP" CBG: Recent Labs  Lab 02/20/23 1102 02/20/23 1222 02/20/23 1628 02/20/23 2125 02/21/23 0846  GLUCAP 133* 125* 100* 116* 153*   D-Dimer No results for input(s): "DDIMER" in the last 72 hours. Hgb A1c Recent Labs    02/19/23 0732  HGBA1C 6.0*   Lipid Profile Recent Labs    02/19/23 0732  CHOL 115  HDL 27*  LDLCALC 67  TRIG 914  CHOLHDL 4.3   Thyroid function studies No results for input(s): "TSH", "T4TOTAL", "T3FREE", "THYROIDAB" in the last 72 hours.  Invalid input(s): "FREET3" Anemia work up No  results for input(s): "VITAMINB12", "FOLATE", "FERRITIN", "TIBC", "IRON", "RETICCTPCT" in the last 72 hours. Urinalysis    Component Value Date/Time   COLORURINE YELLOW 01/13/2019 0156   APPEARANCEUR CLEAR 01/13/2019 0156   LABSPEC >1.046 (H) 01/13/2019 0156   PHURINE 5.0 01/13/2019 0156   GLUCOSEU >=500 (A) 01/13/2019 0156   HGBUR NEGATIVE 01/13/2019 0156   BILIRUBINUR NEGATIVE 01/13/2019 0156   KETONESUR NEGATIVE 01/13/2019 0156   PROTEINUR NEGATIVE 01/13/2019 0156   UROBILINOGEN 4.0 (H) 03/13/2014 1115   NITRITE NEGATIVE 01/13/2019 0156   LEUKOCYTESUR NEGATIVE 01/13/2019 0156   Sepsis Labs Recent Labs  Lab 02/19/23 0337 02/21/23 0255  WBC 10.5 9.2   Microbiology No results found for this or any previous visit (from the past 240 hour(s)).  Please note: You were cared for by a hospitalist during your hospital stay. Once you are discharged, your primary care physician will handle any further medical issues. Please note that NO REFILLS for any discharge medications will be authorized once you are discharged, as it is imperative that you return to your primary care physician (or establish a relationship with a primary care physician if you do not have one) for your post hospital discharge needs so that they can reassess your need for medications and monitor your lab values.    Time coordinating discharge: 40 minutes  SIGNED:   Burnadette Pop, MD  [facility] 02/21/2023, 11:23 AM Pager [phone]  If 7PM-7AM, please contact night-coverage [email or web address] [facility] Physician Discharge Summary  Douglas Mason QMV:[id number] DOB: Aug 27, 1951 DOA: 02/19/2023  PCP: Benita Stab[facility], MD  Admit date: 02/19/2023 Discharge date: 02/21/2023  Admitted From: Home Disposition:  Home  Discharge Condition:Stable CODE STATUS:FULL, DNR, Comfort Care Diet recommendation: Heart Healthy / Carb Modified / Regular / Dysphagia   Brief/Interim Summary:   Following problems were  addressed during the hospitalization:   Discharge Diagnoses:  Principal Problem:   Atypical chest pain Active Problems:   Chest pain with moderate risk of acute coronary syndrome   AKI (acute kidney injury) (HCC)  Essential hypertension   GERD (gastroesophageal reflux disease)   OSA (obstructive sleep apnea)   Anxiety   Diabetes mellitus type II, non insulin dependent (HCC)   Unstable angina (HCC)   Chest pain    Discharge Instructions  Discharge Instructions     Amb Referral to Cardiac Rehabilitation   Complete by: As directed    Diagnosis: Coronary Stents   After initial evaluation and assessments completed: Virtual Based Care may be provided alone or in conjunction with Phase 2 Cardiac Rehab based on patient barriers.: Yes   Intensive Cardiac Rehabilitation (ICR) [facility] location only OR Traditional Cardiac Rehabilitation (TCR) *If criteria for ICR are not met will enroll in TCR [facility] only): Yes   Diet - low sodium heart healthy   Complete by: As directed    Discharge instructions   Complete by: As directed    1)Please take prescribed medications as instructed 2)Follow up with your PCP in a week 3)You will be called by cardiology for follow up appointment   Increase activity slowly   Complete by: As directed       Allergies as of 02/21/2023       Reactions   Lisinopril Cough        Medication List     STOP taking these medications    furosemide 40 MG tablet Commonly known as: LASIX   potassium chloride SA 20 MEQ tablet Commonly known as: KLOR-CON M       TAKE these medications    albuterol 108 (90 Base) MCG/ACT inhaler Commonly known as: VENTOLIN HFA Inhale 2 puffs into the lungs every 4 (four) hours as needed for wheezing or shortness of breath.   albuterol (2.5 MG/3ML) 0.083% nebulizer solution Commonly known as: PROVENTIL Take 3 mLs (2.5 mg total) by nebulization every 6 (six) hours as needed for wheezing or shortness of breath.   ALPRAZolam  0.5 MG tablet Commonly known as: XANAX Take 0.5 mg by mouth at bedtime as needed for anxiety.   aspirin EC 81 MG tablet Take 1 tablet (81 mg total) by mouth daily.   atorvastatin 80 MG tablet Commonly known as: LIPITOR Take 1 tablet (80 mg total) by mouth daily. Start taking on: February 22, 2023 What changed:  medication strength how much to take   bisoprolol 5 MG tablet Commonly known as: ZEBETA Take 1 tablet (5 mg total) by mouth daily.   glipiZIDE 10 MG 24 hr tablet Commonly known as: GLUCOTROL XL Take 1 tablet by mouth 2 (two) times a day.   HYDROcodone-acetaminophen 5-325 MG tablet Commonly known as: NORCO/VICODIN Take 1 tablet by mouth at bedtime as needed for pain.   Jardiance 25 MG Tabs tablet Generic drug: empagliflozin Take 25 mg by mouth daily.   losartan 25 MG tablet Commonly known as: COZAAR Take 1 tablet (25 mg total) by mouth daily. What changed:  medication strength how much to take   Magnesium Oxide 400 MG Caps Take 1 capsule (400 mg total) by mouth daily.   metFORMIN 500 MG 24 hr tablet Commonly known as: GLUCOPHAGE-XR Take 2 tablets (1,000 mg total) by mouth 2 (two) times a day.   multivitamins ther. w/minerals Tabs tablet Take 1 tablet by mouth daily.   pantoprazole 40 MG tablet Commonly known as: PROTONIX Take 1 tablet (40 mg total) by mouth 2 (two) times daily before a meal. What changed: when to take this   pregabalin 50 MG capsule Commonly known as: LYRICA Take 50 mg by mouth 3 (three)  times daily.   ticagrelor 90 MG Tabs tablet Commonly known as: BRILINTA Take 1 tablet (90 mg total) by mouth 2 (two) times daily.   Toujeo SoloStar 300 UNIT/ML Solostar Pen Generic drug: insulin glargine (1 Unit Dial) Inject 12 Units into the skin daily.        Follow-up Information     Benita Stabile, MD. Schedule an appointment as soon as possible for a visit in 1 week(s).   Specialty: Internal Medicine Contact information: [address] [place] [address] [phone]                Allergies  Allergen Reactions   Lisinopril Cough    Consultations:    Procedures/Studies: CARDIAC CATHETERIZATION  Result Date: 02/20/2023   Lat 1st Mrg lesion is 50% stenosed.   Mid LAD lesion is 85% stenosed.   A stent was successfully placed.   Post intervention, there is a 0% residual stenosis. 1.  High-grade mid LAD lesion treated with OCT guided and optimized PCI with 1 drug-eluting stent. 2.  LVEDP of 19 mmHg. Recommendation: Overnight observation, dual antiplatelet therapy at least 6 months and preferably 1 year with aggressive cardiovascular risk factor modification.   ECHOCARDIOGRAM COMPLETE  Result Date: 02/19/2023    ECHOCARDIOGRAM REPORT   Patient Name:   Douglas Mason Date of Exam: 02/19/2023 Medical Rec #:  [id number]         Height:       72.0 in Accession #:    [id number]        Weight:       240.0 lb Date of Birth:  19-Jul-1951        BSA:          2.302 m Patient Age:    71 years          BP:           138/79 mmHg Patient Gender: M                 HR:           65 bpm. [facility] Procedure: 2D Echo, Cardiac Doppler and Color Doppler Indications:    Chest Pain R07.9  History:        Patient has prior history of Echocardiogram examinations, most                 recent 11/23/2021. Arrythmias:Atrial Flutter; Risk                 Factors:Hypertension, Diabetes and Dyslipidemia.  Sonographer:    Celesta Gentile RCS Referring Phys: [phone] SEYED A SHAHMEHDI IMPRESSIONS  1. Left ventricular ejection fraction, by estimation, is 55 to 60%. The left ventricle has normal function. The left ventricle has no regional wall motion abnormalities. There is mild concentric left ventricular hypertrophy. Left ventricular diastolic parameters were normal.  2. Right ventricular systolic function is normal. The right ventricular size is normal. There is mildly elevated pulmonary artery systolic pressure. The estimated right  ventricular systolic pressure is 39.5 mmHg.  3. The mitral valve is grossly normal. Trivial mitral valve regurgitation.  4. The aortic valve is tricuspid. Aortic valve regurgitation is not visualized.  5. The inferior vena cava is normal in size with greater than 50% respiratory variability, suggesting right atrial pressure of 3 mmHg. Comparison(s): Prior images reviewed side by side. LVEF remains normal range at 55-60%. FINDINGS  Left Ventricle: Left ventricular ejection fraction, by estimation, is  55 to 60%. The left ventricle has normal function. The left ventricle has no regional wall motion abnormalities. The left ventricular internal cavity size was normal in size. There is  mild concentric left ventricular hypertrophy. Left ventricular diastolic parameters were normal. Right Ventricle: The right ventricular size is normal. No increase in right ventricular wall thickness. Right ventricular systolic function is normal. There is mildly elevated pulmonary artery systolic pressure. The tricuspid regurgitant velocity is 3.02  m/s, and with an assumed right atrial pressure of 3 mmHg, the estimated right ventricular systolic pressure is 39.5 mmHg. Left Atrium: Left atrial size was normal in size. Right Atrium: Right atrial size was normal in size. Pericardium: There is no evidence of pericardial effusion. Presence of epicardial fat layer. Mitral Valve: The mitral valve is grossly normal. Trivial mitral valve regurgitation. Tricuspid Valve: The tricuspid valve is grossly normal. Tricuspid valve regurgitation is mild. Aortic Valve: The aortic valve is tricuspid. There is mild aortic valve annular calcification. Aortic valve regurgitation is not visualized. Pulmonic Valve: The pulmonic valve was grossly normal. Pulmonic valve regurgitation is trivial. Aorta: The aortic root is normal in size and structure. Venous: The inferior vena cava is normal in size with greater than 50% respiratory variability, suggesting right  atrial pressure of 3 mmHg. IAS/Shunts: No atrial level shunt detected by color flow Doppler.  LEFT VENTRICLE PLAX 2D LVIDd:         5.20 cm   Diastology LVIDs:         2.80 cm   LV e' medial:    9.03 cm/s LV PW:         1.30 cm   LV E/e' medial:  11.8 LV IVS:        1.20 cm   LV e' lateral:   12.00 cm/s LVOT diam:     2.20 cm   LV E/e' lateral: 8.9 LV SV:         93 LV SV Index:   40 LVOT Area:     3.80 cm  RIGHT VENTRICLE RV S prime:     13.90 cm/s TAPSE (M-mode): 2.4 cm LEFT ATRIUM             Index        RIGHT ATRIUM           Index LA diam:        4.20 cm 1.82 cm/m   RA Area:     18.30 cm LA Vol (A2C):   49.1 ml 21.33 ml/m  RA Volume:   50.70 ml  22.02 ml/m LA Vol (A4C):   55.4 ml 24.06 ml/m LA Biplane Vol: 58.1 ml 25.24 ml/m  AORTIC VALVE LVOT Vmax:   121.00 cm/s LVOT Vmean:  78.100 cm/s LVOT VTI:    0.244 m  AORTA Ao Root diam: 3.80 cm MITRAL VALVE                TRICUSPID VALVE MV Area (PHT): 2.80 cm     TR Peak grad:   36.5 mmHg MV Decel Time: 271 msec     TR Vmax:        302.00 cm/s MR Peak grad: 135.0 mmHg MR Mean grad: 86.0 mmHg     SHUNTS MR Vmax:      581.00 cm/s   Systemic VTI:  0.24 m MR Vmean:     426.0 cm/s    Systemic Diam: 2.20 cm MV E velocity: 106.50 cm/s MV A velocity: 83.55 cm/s MV E/A ratio:  1.27 Nona Dell MD Electronically signed by Nona Dell MD Signature Date/Time: 02/19/2023/10:51:52 AM    Final    DG Chest 2 View  Result Date: 02/19/2023 CLINICAL DATA:  Chest pain on the left EXAM: CHEST - 2 VIEW COMPARISON:  09/10/2021 FINDINGS: Cardiac shadow is mildly prominent but stable. Elevation of the right hemidiaphragm is noted. No focal infiltrate or effusion is seen. No bony abnormality is noted. IMPRESSION: No active cardiopulmonary disease. Electronically Signed   By: Alcide Clever M.D.   On: 02/19/2023 04:00      Subjective:   Discharge Exam: Vitals:   02/20/23 2314 02/21/23 0519  BP: (!) 142/80 (!) 144/71  Pulse: 63 60  Resp: 20 (!) 21  Temp: 97.7 F  (36.5 C) 97.6 F (36.4 C)  SpO2: 97% 98%   Vitals:   02/20/23 1635 02/20/23 1930 02/20/23 2314 02/21/23 0519  BP: (!) 144/0 (!) 142/69 (!) 142/80 (!) 144/71  Pulse: 66 69 63 60  Resp: 20 18 20  (!) 21  Temp: (!) 97.5 F (36.4 C) 98 F (36.7 C) 97.7 F (36.5 C) 97.6 F (36.4 C)  TempSrc: Oral Oral Oral Oral  SpO2: 99% 97% 97% 98%  Weight:      Height:        General: Pt is alert, awake, not in acute distress Cardiovascular: RRR, S1/S2 +, no rubs, no gallops Respiratory: CTA bilaterally, no wheezing, no rhonchi Abdominal: Soft, NT, ND, bowel sounds + Extremities: no edema, no cyanosis    The results of significant diagnostics from this hospitalization (including imaging, microbiology, ancillary and laboratory) are listed below for reference.     Microbiology: No results found for this or any previous visit (from the past 240 hour(s)).   Labs: BNP (last 3 results) Recent Labs    11/12/22 1403  BNP 17.0   Basic Metabolic Panel: Recent Labs  Lab 02/19/23 0337 02/20/23 0451 02/21/23 0255  NA 136 138 140  K 4.4 4.5 4.5  CL 101 103 106  CO2 25 26 25   GLUCOSE 165* 133* 132*  BUN 32* 19 13  CREATININE 1.60* 1.11 1.03  CALCIUM 8.6* 8.7* 9.1   Liver Function Tests: No results for input(s): "AST", "ALT", "ALKPHOS", "BILITOT", "PROT", "ALBUMIN" in the last 168 hours. No results for input(s): "LIPASE", "AMYLASE" in the last 168 hours. No results for input(s): "AMMONIA" in the last 168 hours. CBC: Recent Labs  Lab 02/19/23 0337 02/21/23 0255  WBC 10.5 9.2  HGB 11.5* 11.2*  HCT 33.8* 33.3*  MCV 94.4 94.6  PLT 289 300   Cardiac Enzymes: No results for input(s): "CKTOTAL", "CKMB", "CKMBINDEX", "TROPONINI" in the last 168 hours. BNP: Invalid input(s): "POCBNP" CBG: Recent Labs  Lab 02/20/23 1102 02/20/23 1222 02/20/23 1628 02/20/23 2125 02/21/23 0846  GLUCAP 133* 125* 100* 116* 153*   D-Dimer No results for input(s): "DDIMER" in the last 72  hours. Hgb A1c Recent Labs    02/19/23 0732  HGBA1C 6.0*   Lipid Profile Recent Labs    02/19/23 0732  CHOL 115  HDL 27*  LDLCALC 67  TRIG 409  CHOLHDL 4.3   Thyroid function studies No results for input(s): "TSH", "T4TOTAL", "T3FREE", "THYROIDAB" in the last 72 hours.  Invalid input(s): "FREET3" Anemia work up No results for input(s): "VITAMINB12", "FOLATE", "FERRITIN", "TIBC", "IRON", "RETICCTPCT" in the last 72 hours. Urinalysis    Component Value Date/Time   COLORURINE YELLOW 01/13/2019 0156   APPEARANCEUR CLEAR 01/13/2019 0156   LABSPEC >1.046 (H) 01/13/2019 8119  PHURINE 5.0 01/13/2019 0156   GLUCOSEU >=500 (A) 01/13/2019 0156   HGBUR NEGATIVE 01/13/2019 0156   BILIRUBINUR NEGATIVE 01/13/2019 0156   KETONESUR NEGATIVE 01/13/2019 0156   PROTEINUR NEGATIVE 01/13/2019 0156   UROBILINOGEN 4.0 (H) 03/13/2014 1115   NITRITE NEGATIVE 01/13/2019 0156   LEUKOCYTESUR NEGATIVE 01/13/2019 0156   Sepsis Labs Recent Labs  Lab 02/19/23 0337 02/21/23 0255  WBC 10.5 9.2   Microbiology No results found for this or any previous visit (from the past 240 hour(s)).  Please note: You were cared for by a hospitalist during your hospital stay. Once you are discharged, your primary care physician will handle any further medical issues. Please note that NO REFILLS for any discharge medications will be authorized once you are discharged, as it is imperative that you return to your primary care physician (or establish a relationship with a primary care physician if you do not have one) for your post hospital discharge needs so that they can reassess your need for medications and monitor your lab values.    Time coordinating discharge: 40 minutes  SIGNED:   Burnadette Pop, MD  [facility] 02/21/2023, 11:23 AM Pager [phone]  If 7PM-7AM, please contact night-coverage [email or web address] Password [email or web address]

## 2023-02-21 NOTE — Progress Notes (Signed)
CARDIAC REHAB PHASE I   PRE:  Rate/Rhythm: 78 SR  BP:  Sitting: 144/71      SaO2: 98 RA  MODE:  Ambulation: 470 ft   POST:  Rate/Rhythm: 82 SR  BP:  Sitting: 148/80      SaO2: 97 RA  Pt ambulated independently in hall, tolerated well with no CP,SOB or dizziness. Returned to bed with call bell and bedside table in reach. Post stent education including site care, restrictions, risk factors, exercise guidelines, NTG use, antiplatelet therapy importance, heart healthy diabetic diet and CRP2 reviewed. All questions and concerns addressed. Will refer to [facility] for CRP2. Plan for home later today.   [id number]  Woodroe Chen, RN BSN 02/21/2023 9:25 AM

## 2023-02-23 LAB — LIPOPROTEIN A (LPA): Lipoprotein (a): 8.5 nmol/L (ref <75.0)

## 2023-02-26 ENCOUNTER — Telehealth: Payer: Self-pay | Admitting: Internal Medicine

## 2023-02-26 DIAGNOSIS — I219 Acute myocardial infarction, unspecified: Secondary | ICD-10-CM | POA: Diagnosis not present

## 2023-02-26 DIAGNOSIS — Z79899 Other long term (current) drug therapy: Secondary | ICD-10-CM | POA: Diagnosis not present

## 2023-02-26 DIAGNOSIS — Z87891 Personal history of nicotine dependence: Secondary | ICD-10-CM | POA: Diagnosis not present

## 2023-02-26 DIAGNOSIS — R600 Localized edema: Secondary | ICD-10-CM | POA: Diagnosis not present

## 2023-02-26 DIAGNOSIS — Z955 Presence of coronary angioplasty implant and graft: Secondary | ICD-10-CM | POA: Diagnosis not present

## 2023-02-26 DIAGNOSIS — F419 Anxiety disorder, unspecified: Secondary | ICD-10-CM | POA: Diagnosis not present

## 2023-02-26 NOTE — Telephone Encounter (Signed)
Spoke to patient who stated that he feels great at this time and would like to keep his original appointment with Sharlene Dory on 7/5. Patient had no concerns or complaints at this time.

## 2023-02-26 NOTE — Telephone Encounter (Signed)
Patient has follow up with Sharlene Dory, NP on 7/5. Does this need to be moved to see you on a hospital day?

## 2023-02-26 NOTE — Telephone Encounter (Signed)
New Message:     Dr Scharlene Gloss nurse called. She wanted  to notify Dr Mikal Plane that he had a MI and stent was put in [facility], He was admitted to [facility] on 02-19-23 and discharged on 02-21-23.> Dr Margo Aye wants patient to have a follow up appointment.

## 2023-03-01 ENCOUNTER — Encounter (HOSPITAL_COMMUNITY)
Admission: EM | Disposition: A | Discharge status: Home / Self Care | Payer: Self-pay | Source: Home / Self Care | Attending: Interventional Cardiology

## 2023-03-01 ENCOUNTER — Inpatient Hospital Stay (HOSPITAL_COMMUNITY)
Admission: EM | Admit: 2023-03-01 | Discharge: 2023-03-04 | DRG: 250 | Disposition: A | Discharge status: Home / Self Care | Payer: Medicare Other | Attending: Cardiovascular Disease | Admitting: Cardiovascular Disease

## 2023-03-01 DIAGNOSIS — Z6831 Body mass index (BMI) 31.0-31.9, adult: Secondary | ICD-10-CM | POA: Diagnosis not present

## 2023-03-01 DIAGNOSIS — T82867A Thrombosis of cardiac prosthetic devices, implants and grafts, initial encounter: Secondary | ICD-10-CM | POA: Diagnosis not present

## 2023-03-01 DIAGNOSIS — E119 Type 2 diabetes mellitus without complications: Secondary | ICD-10-CM

## 2023-03-01 DIAGNOSIS — I255 Ischemic cardiomyopathy: Secondary | ICD-10-CM | POA: Diagnosis not present

## 2023-03-01 DIAGNOSIS — I219 Acute myocardial infarction, unspecified: Secondary | ICD-10-CM | POA: Diagnosis not present

## 2023-03-01 DIAGNOSIS — E7800 Pure hypercholesterolemia, unspecified: Secondary | ICD-10-CM | POA: Diagnosis present

## 2023-03-01 DIAGNOSIS — I2109 ST elevation (STEMI) myocardial infarction involving other coronary artery of anterior wall: Secondary | ICD-10-CM | POA: Diagnosis present

## 2023-03-01 DIAGNOSIS — Z7902 Long term (current) use of antithrombotics/antiplatelets: Secondary | ICD-10-CM

## 2023-03-01 DIAGNOSIS — I21A9 Other myocardial infarction type: Secondary | ICD-10-CM | POA: Diagnosis not present

## 2023-03-01 DIAGNOSIS — Z888 Allergy status to other drugs, medicaments and biological substances status: Secondary | ICD-10-CM

## 2023-03-01 DIAGNOSIS — G4733 Obstructive sleep apnea (adult) (pediatric): Secondary | ICD-10-CM | POA: Diagnosis present

## 2023-03-01 DIAGNOSIS — Z825 Family history of asthma and other chronic lower respiratory diseases: Secondary | ICD-10-CM

## 2023-03-01 DIAGNOSIS — Z91120 Patient's intentional underdosing of medication regimen due to financial hardship: Secondary | ICD-10-CM

## 2023-03-01 DIAGNOSIS — F32A Depression, unspecified: Secondary | ICD-10-CM | POA: Diagnosis not present

## 2023-03-01 DIAGNOSIS — I2510 Atherosclerotic heart disease of native coronary artery without angina pectoris: Secondary | ICD-10-CM | POA: Diagnosis not present

## 2023-03-01 DIAGNOSIS — I110 Hypertensive heart disease with heart failure: Secondary | ICD-10-CM | POA: Diagnosis present

## 2023-03-01 DIAGNOSIS — I10 Essential (primary) hypertension: Secondary | ICD-10-CM | POA: Diagnosis present

## 2023-03-01 DIAGNOSIS — Z806 Family history of leukemia: Secondary | ICD-10-CM

## 2023-03-01 DIAGNOSIS — E669 Obesity, unspecified: Secondary | ICD-10-CM | POA: Diagnosis not present

## 2023-03-01 DIAGNOSIS — Z794 Long term (current) use of insulin: Secondary | ICD-10-CM | POA: Diagnosis not present

## 2023-03-01 DIAGNOSIS — Z79899 Other long term (current) drug therapy: Secondary | ICD-10-CM

## 2023-03-01 DIAGNOSIS — Z7982 Long term (current) use of aspirin: Secondary | ICD-10-CM

## 2023-03-01 DIAGNOSIS — R0789 Other chest pain: Secondary | ICD-10-CM | POA: Diagnosis not present

## 2023-03-01 DIAGNOSIS — I25110 Atherosclerotic heart disease of native coronary artery with unstable angina pectoris: Secondary | ICD-10-CM

## 2023-03-01 DIAGNOSIS — I2102 ST elevation (STEMI) myocardial infarction involving left anterior descending coronary artery: Secondary | ICD-10-CM | POA: Diagnosis not present

## 2023-03-01 DIAGNOSIS — F419 Anxiety disorder, unspecified: Secondary | ICD-10-CM | POA: Diagnosis present

## 2023-03-01 DIAGNOSIS — R0689 Other abnormalities of breathing: Secondary | ICD-10-CM | POA: Diagnosis not present

## 2023-03-01 DIAGNOSIS — Y831 Surgical operation with implant of artificial internal device as the cause of abnormal reaction of the patient, or of later complication, without mention of misadventure at the time of the procedure: Secondary | ICD-10-CM | POA: Diagnosis present

## 2023-03-01 DIAGNOSIS — Z7984 Long term (current) use of oral hypoglycemic drugs: Secondary | ICD-10-CM | POA: Diagnosis not present

## 2023-03-01 DIAGNOSIS — R61 Generalized hyperhidrosis: Secondary | ICD-10-CM | POA: Diagnosis not present

## 2023-03-01 DIAGNOSIS — I213 ST elevation (STEMI) myocardial infarction of unspecified site: Principal | ICD-10-CM

## 2023-03-01 DIAGNOSIS — T45526A Underdosing of antithrombotic drugs, initial encounter: Secondary | ICD-10-CM | POA: Diagnosis present

## 2023-03-01 DIAGNOSIS — R918 Other nonspecific abnormal finding of lung field: Secondary | ICD-10-CM | POA: Diagnosis not present

## 2023-03-01 DIAGNOSIS — Z87891 Personal history of nicotine dependence: Secondary | ICD-10-CM | POA: Diagnosis not present

## 2023-03-01 DIAGNOSIS — Z9049 Acquired absence of other specified parts of digestive tract: Secondary | ICD-10-CM

## 2023-03-01 DIAGNOSIS — E1165 Type 2 diabetes mellitus with hyperglycemia: Secondary | ICD-10-CM | POA: Diagnosis present

## 2023-03-01 DIAGNOSIS — K219 Gastro-esophageal reflux disease without esophagitis: Secondary | ICD-10-CM | POA: Diagnosis present

## 2023-03-01 DIAGNOSIS — N179 Acute kidney failure, unspecified: Secondary | ICD-10-CM

## 2023-03-01 DIAGNOSIS — I97190 Other postprocedural cardiac functional disturbances following cardiac surgery: Principal | ICD-10-CM | POA: Diagnosis present

## 2023-03-01 DIAGNOSIS — I499 Cardiac arrhythmia, unspecified: Secondary | ICD-10-CM | POA: Diagnosis not present

## 2023-03-01 DIAGNOSIS — I5021 Acute systolic (congestive) heart failure: Secondary | ICD-10-CM | POA: Diagnosis not present

## 2023-03-01 DIAGNOSIS — E118 Type 2 diabetes mellitus with unspecified complications: Secondary | ICD-10-CM | POA: Diagnosis not present

## 2023-03-01 DIAGNOSIS — R0602 Shortness of breath: Secondary | ICD-10-CM | POA: Diagnosis not present

## 2023-03-01 HISTORY — PX: CORONARY/GRAFT ACUTE MI REVASCULARIZATION: CATH118305

## 2023-03-01 HISTORY — PX: LEFT HEART CATH AND CORONARY ANGIOGRAPHY: CATH118249

## 2023-03-01 LAB — CBC WITH DIFFERENTIAL/PLATELET
Abs Immature Granulocytes: 0.04 10*3/uL (ref 0.00–0.07)
Basophils Absolute: 0.1 10*3/uL (ref 0.0–0.1)
Basophils Relative: 1 %
Eosinophils Absolute: 0.3 10*3/uL (ref 0.0–0.5)
Eosinophils Relative: 3 %
HCT: 32.2 % — ABNORMAL LOW (ref 39.0–52.0)
Hemoglobin: 10.5 g/dL — ABNORMAL LOW (ref 13.0–17.0)
Immature Granulocytes: 0 %
Lymphocytes Relative: 21 %
Lymphs Abs: 2.1 10*3/uL (ref 0.7–4.0)
MCH: 32.3 pg (ref 26.0–34.0)
MCHC: 32.6 g/dL (ref 30.0–36.0)
MCV: 99.1 fL (ref 80.0–100.0)
Monocytes Absolute: 1 10*3/uL (ref 0.1–1.0)
Monocytes Relative: 10 %
Neutro Abs: 6.7 10*3/uL (ref 1.7–7.7)
Neutrophils Relative %: 65 %
Platelets: 340 10*3/uL (ref 150–400)
RBC: 3.25 MIL/uL — ABNORMAL LOW (ref 4.22–5.81)
RDW: 15.9 % — ABNORMAL HIGH (ref 11.5–15.5)
WBC: 10.3 10*3/uL (ref 4.0–10.5)
nRBC: 0 % (ref 0.0–0.2)

## 2023-03-01 LAB — COMPREHENSIVE METABOLIC PANEL
ALT: 39 U/L (ref 0–44)
AST: 31 U/L (ref 15–41)
Albumin: 3.7 g/dL (ref 3.5–5.0)
Alkaline Phosphatase: 81 U/L (ref 38–126)
Anion gap: 10 (ref 5–15)
BUN: 16 mg/dL (ref 8–23)
CO2: 21 mmol/L — ABNORMAL LOW (ref 22–32)
Calcium: 8.7 mg/dL — ABNORMAL LOW (ref 8.9–10.3)
Chloride: 107 mmol/L (ref 98–111)
Creatinine, Ser: 1.28 mg/dL — ABNORMAL HIGH (ref 0.61–1.24)
GFR, Estimated: 59 mL/min — ABNORMAL LOW (ref >60)
Glucose, Bld: 263 mg/dL — ABNORMAL HIGH (ref 70–99)
Potassium: 4.5 mmol/L (ref 3.5–5.1)
Sodium: 138 mmol/L (ref 135–145)
Total Bilirubin: 0.7 mg/dL (ref 0.3–1.2)
Total Protein: 6.5 g/dL (ref 6.5–8.1)

## 2023-03-01 LAB — GLUCOSE, CAPILLARY: Glucose-Capillary: 216 mg/dL — ABNORMAL HIGH (ref 70–99)

## 2023-03-01 LAB — POCT I-STAT, CHEM 8
BUN: 17 mg/dL (ref 8–23)
Calcium, Ion: 1.21 mmol/L (ref 1.15–1.40)
Chloride: 105 mmol/L (ref 98–111)
Creatinine, Ser: 1.1 mg/dL (ref 0.61–1.24)
Glucose, Bld: 262 mg/dL — ABNORMAL HIGH (ref 70–99)
HCT: 28 % — ABNORMAL LOW (ref 39.0–52.0)
Hemoglobin: 9.5 g/dL — ABNORMAL LOW (ref 13.0–17.0)
Potassium: 4.5 mmol/L (ref 3.5–5.1)
Sodium: 140 mmol/L (ref 135–145)
TCO2: 23 mmol/L (ref 22–32)

## 2023-03-01 LAB — TROPONIN I (HIGH SENSITIVITY)
Troponin I (High Sensitivity): 37 ng/L — ABNORMAL HIGH (ref <18)
Troponin I (High Sensitivity): >24000 ng/L (ref <18)

## 2023-03-01 LAB — MRSA NEXT GEN BY PCR, NASAL: MRSA by PCR Next Gen: NOT DETECTED

## 2023-03-01 SURGERY — CORONARY/GRAFT ACUTE MI REVASCULARIZATION
Anesthesia: LOCAL

## 2023-03-01 MED ORDER — LIDOCAINE HCL (PF) 1 % IJ SOLN
INTRAMUSCULAR | Status: AC
  Filled 2023-03-01: qty 30

## 2023-03-01 MED ORDER — ALBUTEROL SULFATE (2.5 MG/3ML) 0.083% IN NEBU: 2.5000 mg | INHALATION_SOLUTION | Freq: Four times a day (QID) | RESPIRATORY_TRACT | Status: DC | PRN

## 2023-03-01 MED ORDER — ONDANSETRON HCL 4 MG/2ML IJ SOLN
4.0000 mg | Freq: Four times a day (QID) | INTRAMUSCULAR | Status: DC | PRN
  Administered 2023-03-02: 4 mg via INTRAVENOUS
  Filled 2023-03-01: qty 2

## 2023-03-01 MED ORDER — LABETALOL HCL 5 MG/ML IV SOLN: 10.0000 mg | INTRAVENOUS | Status: AC | PRN

## 2023-03-01 MED ORDER — HYDRALAZINE HCL 20 MG/ML IJ SOLN: 10.0000 mg | INTRAMUSCULAR | Status: AC | PRN

## 2023-03-01 MED ORDER — HEPARIN SODIUM (PORCINE) 1000 UNIT/ML IJ SOLN
INTRAMUSCULAR | Status: AC
  Filled 2023-03-01: qty 10

## 2023-03-01 MED ORDER — HEPARIN SODIUM (PORCINE) 1000 UNIT/ML IJ SOLN
INTRAMUSCULAR | Status: DC | PRN
  Administered 2023-03-01: 8000 [IU] via INTRAVENOUS

## 2023-03-01 MED ORDER — SODIUM CHLORIDE 0.9 % IV SOLN
INTRAVENOUS | Status: AC | PRN
  Administered 2023-03-01: 10 mL/h via INTRAVENOUS

## 2023-03-01 MED ORDER — TICAGRELOR 90 MG PO TABS
ORAL_TABLET | ORAL | Status: AC
  Filled 2023-03-01: qty 2

## 2023-03-01 MED ORDER — ASPIRIN 81 MG PO TBEC: 81.0000 mg | DELAYED_RELEASE_TABLET | Freq: Every day | ORAL | Status: DC

## 2023-03-01 MED ORDER — TICAGRELOR 90 MG PO TABS
90.0000 mg | ORAL_TABLET | Freq: Two times a day (BID) | ORAL | Status: AC
  Administered 2023-03-02 – 2023-03-03 (×4): 90 mg via ORAL
  Filled 2023-03-01 (×4): qty 1

## 2023-03-01 MED ORDER — LOSARTAN POTASSIUM 25 MG PO TABS
25.0000 mg | ORAL_TABLET | Freq: Every day | ORAL | Status: DC
  Administered 2023-03-02 – 2023-03-04 (×3): 25 mg via ORAL
  Filled 2023-03-01 (×3): qty 1

## 2023-03-01 MED ORDER — ALBUTEROL SULFATE HFA 108 (90 BASE) MCG/ACT IN AERS: 2.0000 | INHALATION_SPRAY | RESPIRATORY_TRACT | Status: DC | PRN

## 2023-03-01 MED ORDER — TIROFIBAN HCL IN NACL 5-0.9 MG/100ML-% IV SOLN
INTRAVENOUS | Status: AC | PRN
  Administered 2023-03-01: .15 ug/kg/min via INTRAVENOUS

## 2023-03-01 MED ORDER — VERAPAMIL HCL 2.5 MG/ML IV SOLN
INTRAVENOUS | Status: DC | PRN
  Administered 2023-03-01 (×2): 10 mL via INTRA_ARTERIAL

## 2023-03-01 MED ORDER — TICAGRELOR 90 MG PO TABS: 90.0000 mg | ORAL_TABLET | Freq: Two times a day (BID) | ORAL | Status: DC

## 2023-03-01 MED ORDER — VERAPAMIL HCL 2.5 MG/ML IV SOLN
INTRAVENOUS | Status: AC
  Filled 2023-03-01: qty 2

## 2023-03-01 MED ORDER — FUROSEMIDE 10 MG/ML IJ SOLN
INTRAMUSCULAR | Status: DC | PRN
  Administered 2023-03-01: 40 mg via INTRAVENOUS

## 2023-03-01 MED ORDER — CHLORHEXIDINE GLUCONATE CLOTH 2 % EX PADS
6.0000 | MEDICATED_PAD | Freq: Every day | CUTANEOUS | Status: DC
  Administered 2023-03-01 – 2023-03-03 (×3): 6 via TOPICAL

## 2023-03-01 MED ORDER — INSULIN GLARGINE-YFGN 100 UNIT/ML ~~LOC~~ SOLN
12.0000 [IU] | Freq: Every day | SUBCUTANEOUS | Status: DC
  Administered 2023-03-02 – 2023-03-04 (×3): 12 [IU] via SUBCUTANEOUS
  Filled 2023-03-01 (×3): qty 0.12

## 2023-03-01 MED ORDER — PANTOPRAZOLE SODIUM 40 MG PO TBEC
40.0000 mg | DELAYED_RELEASE_TABLET | Freq: Every day | ORAL | Status: DC
  Administered 2023-03-01 – 2023-03-03 (×3): 40 mg via ORAL
  Filled 2023-03-01 (×3): qty 1

## 2023-03-01 MED ORDER — ATORVASTATIN CALCIUM 80 MG PO TABS
80.0000 mg | ORAL_TABLET | Freq: Every day | ORAL | Status: DC
  Administered 2023-03-02 – 2023-03-04 (×3): 80 mg via ORAL
  Filled 2023-03-01 (×3): qty 1

## 2023-03-01 MED ORDER — BISOPROLOL FUMARATE 5 MG PO TABS
5.0000 mg | ORAL_TABLET | Freq: Every day | ORAL | Status: DC
  Administered 2023-03-02 – 2023-03-04 (×3): 5 mg via ORAL
  Filled 2023-03-01 (×3): qty 1

## 2023-03-01 MED ORDER — HYDROCODONE-ACETAMINOPHEN 5-325 MG PO TABS
1.0000 | ORAL_TABLET | Freq: Four times a day (QID) | ORAL | Status: DC | PRN
  Administered 2023-03-02: 1 via ORAL
  Filled 2023-03-01: qty 1

## 2023-03-01 MED ORDER — SODIUM CHLORIDE 0.9% FLUSH
3.0000 mL | Freq: Two times a day (BID) | INTRAVENOUS | Status: DC
  Administered 2023-03-01 – 2023-03-04 (×5): 3 mL via INTRAVENOUS

## 2023-03-01 MED ORDER — TIROFIBAN HCL IN NACL 5-0.9 MG/100ML-% IV SOLN
INTRAVENOUS | Status: AC
  Filled 2023-03-01: qty 100

## 2023-03-01 MED ORDER — MAGNESIUM OXIDE -MG SUPPLEMENT 400 (240 MG) MG PO TABS
400.0000 mg | ORAL_TABLET | Freq: Every day | ORAL | Status: DC
  Administered 2023-03-02 – 2023-03-04 (×3): 400 mg via ORAL
  Filled 2023-03-01 (×3): qty 1

## 2023-03-01 MED ORDER — IOHEXOL 350 MG/ML SOLN
INTRAVENOUS | Status: DC | PRN
  Administered 2023-03-01: 70 mL via INTRA_ARTERIAL

## 2023-03-01 MED ORDER — TICAGRELOR 90 MG PO TABS
ORAL_TABLET | ORAL | Status: DC | PRN
  Administered 2023-03-01: 180 mg via ORAL

## 2023-03-01 MED ORDER — ASPIRIN 81 MG PO CHEW
81.0000 mg | CHEWABLE_TABLET | Freq: Every day | ORAL | Status: DC
  Administered 2023-03-02 – 2023-03-04 (×3): 81 mg via ORAL
  Filled 2023-03-01 (×3): qty 1

## 2023-03-01 MED ORDER — ALPRAZOLAM 0.5 MG PO TABS
0.5000 mg | ORAL_TABLET | Freq: Every evening | ORAL | Status: DC | PRN
  Administered 2023-03-02: 0.5 mg via ORAL
  Filled 2023-03-01: qty 1

## 2023-03-01 MED ORDER — ACETAMINOPHEN 325 MG PO TABS: 650.0000 mg | ORAL_TABLET | ORAL | Status: DC | PRN

## 2023-03-01 MED ORDER — INSULIN GLARGINE (1 UNIT DIAL) 300 UNIT/ML ~~LOC~~ SOPN: 12.0000 [IU] | PEN_INJECTOR | Freq: Every day | SUBCUTANEOUS | Status: DC

## 2023-03-01 MED ORDER — SODIUM CHLORIDE 0.9% FLUSH: 3.0000 mL | INTRAVENOUS | Status: DC | PRN

## 2023-03-01 MED ORDER — TIROFIBAN HCL IN NACL 5-0.9 MG/100ML-% IV SOLN
0.1500 ug/kg/min | INTRAVENOUS | Status: DC
  Administered 2023-03-02 (×2): 0.15 ug/kg/min via INTRAVENOUS
  Filled 2023-03-01 (×2): qty 100

## 2023-03-01 MED ORDER — FUROSEMIDE 10 MG/ML IJ SOLN
INTRAMUSCULAR | Status: AC
  Filled 2023-03-01: qty 4

## 2023-03-01 MED ORDER — HEPARIN (PORCINE) IN NACL 1000-0.9 UT/500ML-% IV SOLN
INTRAVENOUS | Status: DC | PRN
  Administered 2023-03-01 (×2): 500 mL

## 2023-03-01 MED ORDER — TIROFIBAN (AGGRASTAT) BOLUS VIA INFUSION
INTRAVENOUS | Status: DC | PRN
  Administered 2023-03-01: 2675 ug via INTRAVENOUS

## 2023-03-01 MED ORDER — ADULT MULTIVITAMIN W/MINERALS CH
1.0000 | ORAL_TABLET | Freq: Every day | ORAL | Status: DC
  Administered 2023-03-02 – 2023-03-04 (×3): 1 via ORAL
  Filled 2023-03-01 (×3): qty 1

## 2023-03-01 MED ORDER — SODIUM CHLORIDE 0.9 % IV SOLN: 250.0000 mL | INTRAVENOUS | Status: DC | PRN

## 2023-03-01 MED ORDER — EMPAGLIFLOZIN 25 MG PO TABS
25.0000 mg | ORAL_TABLET | Freq: Every day | ORAL | Status: DC
  Administered 2023-03-02 – 2023-03-04 (×3): 25 mg via ORAL
  Filled 2023-03-01 (×3): qty 1

## 2023-03-01 MED ORDER — PREGABALIN 50 MG PO CAPS
50.0000 mg | ORAL_CAPSULE | Freq: Three times a day (TID) | ORAL | Status: DC
  Administered 2023-03-01 – 2023-03-04 (×8): 50 mg via ORAL
  Filled 2023-03-01 (×8): qty 1

## 2023-03-01 MED ORDER — LIDOCAINE HCL (PF) 1 % IJ SOLN
INTRAMUSCULAR | Status: DC | PRN
  Administered 2023-03-01: 2 mL

## 2023-03-01 SURGICAL SUPPLY — 21 items
BALL SAPPHIRE NC24 3.25X18 (BALLOONS) ×1
BALLN EMERGE MR 2.0X12 (BALLOONS) ×1
BALLN EMERGE MR 2.75X15 (BALLOONS) ×1
BALLOON EMERGE MR 2.0X12 (BALLOONS) IMPLANT
BALLOON EMERGE MR 2.75X15 (BALLOONS) IMPLANT
BALLOON SAPPHIRE NC24 3.25X18 (BALLOONS) IMPLANT
CATH 5FR JL3.5 JR4 ANG PIG MP (CATHETERS) IMPLANT
CATH LAUNCHER 6FR EBU 3.75 (CATHETERS) IMPLANT
DEVICE RAD COMP TR BAND LRG (VASCULAR PRODUCTS) IMPLANT
GLIDESHEATH SLEND SS 6F .021 (SHEATH) IMPLANT
GUIDEWIRE INQWIRE 1.5J.035X260 (WIRE) IMPLANT
INQWIRE 1.5J .035X260CM (WIRE) ×1
KIT ENCORE 26 ADVANTAGE (KITS) IMPLANT
KIT HEART LEFT (KITS) ×2 IMPLANT
KIT HEMO VALVE WATCHDOG (MISCELLANEOUS) IMPLANT
PACK CARDIAC CATHETERIZATION (CUSTOM PROCEDURE TRAY) ×2 IMPLANT
SHEATH PROBE COVER 6X72 (BAG) IMPLANT
TRANSDUCER W/STOPCOCK (MISCELLANEOUS) ×2 IMPLANT
TUBING CIL FLEX 10 FLL-RA (TUBING) ×2 IMPLANT
WIRE ASAHI PROWATER 180CM (WIRE) IMPLANT
WIRE RUNTHROUGH IZANAI 014 180 (WIRE) IMPLANT

## 2023-03-01 NOTE — ED Triage Notes (Signed)
Pt transported to cath lab at this time.

## 2023-03-01 NOTE — H&P (Addendum)
Cardiology Admission History and Physical   Patient ID: Douglas Mason MRN: [id number]; DOB: 05-20-1951   Admission date: 03/01/2023  PCP:  Benita Stabile, MD   [facility] Providers Cardiologist:  Christell Constant, MD        Chief Complaint:  Chest pain  Patient Profile:   Douglas Mason is a 72 y.o. male with  past medical history of HTN, HLD, type II DM, OSA, anxiety and CAD s/p LAD stent 2 weeks ago who is being seen 03/01/2023 for the evaluation of acute STEMI.  History of Present Illness:   Douglas Mason is a 72 y.o. male with  past medical history of HTN, HLD, type II DM, OSA, anxiety and CAD s/p LAD stent 2 weeks ago who is being seen 03/01/2023 for the evaluation of acute STEMI. Since discharge, he has not taken brilinta. Started having severe crushing CP at 3:30 pm associated with SOB and N/V and radiating to left shoulder. Hence called EMS. EKG showed anterior and lateral ST elevations hence code STEMI was activated. He received 4K heparin in the ED, 2 doses of nitroglycerin and 2 morphine and 324 mg Aspirin. His pain remained at 10/10. He was taken emergently to the [facility].    Past Medical History:  Diagnosis Date   Bronchitis    Diabetes mellitus without complication (HCC)    GERD (gastroesophageal reflux disease)    High cholesterol    Hypertension    Pleurisy    Pneumonia    Sleep apnea    nurse in pre-op observed pt sats drop to 80-82% while sleeping on RA,wake up would go to 87%    Past Surgical History:  Procedure Laterality Date   CHOLECYSTECTOMY N/A 01/13/2019   Procedure: LAPAROSCOPIC CHOLECYSTECTOMY;  Surgeon: Lucretia Roers, MD;  Location: [facility] ORS;  Service: General;  Laterality: N/A;   CORONARY IMAGING/OCT N/A 02/20/2023   Procedure: CORONARY IMAGING/OCT;  Surgeon: Orbie Pyo, MD;  Location: [facility] INVASIVE CV LAB;  Service: Cardiovascular;  Laterality: N/A;   CORONARY STENT INTERVENTION N/A 02/20/2023   Procedure: CORONARY STENT  INTERVENTION;  Surgeon: Orbie Pyo, MD;  Location: [facility] INVASIVE CV LAB;  Service: Cardiovascular;  Laterality: N/A;   LEFT HEART CATH AND CORONARY ANGIOGRAPHY N/A 02/20/2023   Procedure: LEFT HEART CATH AND CORONARY ANGIOGRAPHY;  Surgeon: Orbie Pyo, MD;  Location: [facility] INVASIVE CV LAB;  Service: Cardiovascular;  Laterality: N/A;     Medications Prior to Admission: Prior to Admission medications   Medication Sig Start Date End Date Taking? Authorizing Provider  albuterol (PROVENTIL HFA;VENTOLIN HFA) 108 (90 BASE) MCG/ACT inhaler Inhale 2 puffs into the lungs every 4 (four) hours as needed for wheezing or shortness of breath. 06/28/14   Raeford Razor, MD  albuterol (PROVENTIL) (2.5 MG/3ML) 0.083% nebulizer solution Take 3 mLs (2.5 mg total) by nebulization every 6 (six) hours as needed for wheezing or shortness of breath. 08/18/14   Parrett, Virgel Bouquet, NP  ALPRAZolam Prudy Feeler) 0.5 MG tablet Take 0.5 mg by mouth at bedtime as needed for anxiety. 01/12/14   [provider]  aspirin EC 81 MG tablet Take 1 tablet (81 mg total) by mouth daily. 02/21/23   Burnadette Pop, MD  atorvastatin (LIPITOR) 80 MG tablet Take 1 tablet (80 mg total) by mouth daily. 02/22/23   Burnadette Pop, MD  bisoprolol (ZEBETA) 5 MG tablet Take 1 tablet (5 mg total) by mouth daily. 02/21/23   Burnadette Pop, MD  glipiZIDE (GLUCOTROL XL)  10 MG 24 hr tablet Take 1 tablet by mouth 2 (two) times a day. 12/04/18   [provider]  HYDROcodone-acetaminophen (NORCO/VICODIN) 5-325 MG tablet Take 1 tablet by mouth at bedtime as needed for pain. 09/05/21   [provider]  JARDIANCE 25 MG TABS tablet Take 25 mg by mouth daily. Patient not taking: Reported on 02/19/2023 05/17/21   [provider]  losartan (COZAAR) 25 MG tablet Take 1 tablet (25 mg total) by mouth daily. 02/21/23   Burnadette Pop, MD  Magnesium Oxide 400 MG CAPS Take 1 capsule (400 mg total) by mouth daily. 11/12/22   Strader, Lennart Pall,  PA-C  metFORMIN (GLUCOPHAGE-XR) 500 MG 24 hr tablet Take 2 tablets (1,000 mg total) by mouth 2 (two) times a day. 01/16/19   Lucretia Roers, MD  Multiple Vitamins-Minerals (MULTIVITAMINS THER. W/MINERALS) TABS tablet Take 1 tablet by mouth daily.    [provider]  pantoprazole (PROTONIX) 40 MG tablet Take 1 tablet (40 mg total) by mouth 2 (two) times daily before a meal. Patient taking differently: Take 40 mg by mouth at bedtime. 03/17/14   Lorenda Hatchet, MD  pregabalin (LYRICA) 50 MG capsule Take 50 mg by mouth 3 (three) times daily.    [provider]  ticagrelor (BRILINTA) 90 MG TABS tablet Take 1 tablet (90 mg total) by mouth 2 (two) times daily. 02/21/23   Burnadette Pop, MD  TOUJEO SOLOSTAR 300 UNIT/ML Solostar Pen Inject 12 Units into the skin daily. 01/24/23   [provider]     Allergies:    Allergies  Allergen Reactions   Lisinopril Cough    Social History:   Social History   Socioeconomic History   Marital status: Married    Spouse name: Not on file   Number of children: Not on file   Years of education: Not on file   Highest education level: Not on file  Occupational History    Employer: [employer]    Comment: [employer]  Tobacco Use   Smoking status: Former    Packs/day: 2.00    Years: 30.00    Additional pack years: 0.00    Total pack years: 60.00    Types: Cigarettes    Quit date: 09/02/1976    Years since quitting: 46.5   Smokeless tobacco: Former    Types: Chew    Quit date: 09/03/2003  Vaping Use   Vaping Use: Never used  Substance and Sexual Activity   Alcohol use: No   Drug use: No   Sexual activity: Yes  Other Topics Concern   Not on file  Social History Narrative   Not on file   Social Determinants of Health   Financial Resource Strain: Not on file  Food Insecurity: No Food Insecurity (02/19/2023)   Hunger Vital Sign    Worried About Running Out of Food in the Last Year: Never true    Ran Out of Food in the Last  Year: Never true  Transportation Needs: No Transportation Needs (02/19/2023)   PRAPARE - Administrator, Civil Service (Medical): No    Lack of Transportation (Non-Medical): No  Physical Activity: Not on file  Stress: Not on file  Social Connections: Not on file  Intimate Partner Violence: Not At Risk (02/19/2023)   Humiliation, Afraid, Rape, and Kick questionnaire    Fear of Current or Ex-Partner: No    Emotionally Abused: No    Physically Abused: No    Sexually Abused: No  Family History:  The patient's family history includes Asthma in his mother; Leukemia in his mother; Rheum arthritis in his maternal grandmother and mother.    ROS:  Please see the history of present illness.  All other ROS reviewed and negative.     Physical Exam/Data:   Vitals:   03/01/23 1932 03/01/23 1933 03/01/23 1934 03/01/23 1948  BP:   127/77   Pulse:      Resp:      Temp:      TempSrc:      SpO2:  100%  98%  Weight: 107 kg       Intake/Output Summary (Last 24 hours) at 03/01/2023 1959 Last data filed at 03/01/2023 1928 Gross per 24 hour  Intake 150 ml  Output --  Net 150 ml      03/01/2023    7:32 PM 02/20/2023   12:19 PM 02/19/2023    9:57 AM  Last 3 Weights  Weight (lbs) 235 lb 14.3 oz 236 lb 5.3 oz 236 lb 5.3 oz  Weight (kg) 107 kg 107.2 kg 107.2 kg     Body mass index is 31.99 kg/m.  General:  Well nourished, well developed. Ill-appearing HEENT: normal Neck: no JVD Vascular: No carotid bruits; Distal pulses 2+ bilaterally   Cardiac:  normal S1, S2; RRR; no murmur  Lungs: Poor airflow Abd: soft, nontender, no hepatomegaly  Ext: no edema Musculoskeletal:  No deformities, BUE and BLE strength normal and equal Skin: warm and dry  Neuro:  CNs 2-12 intact, no focal abnormalities noted Psych:  Normal affect    EKG:  The ECG that was done  was personally reviewed and demonstrates ST elevation in lateral and anterior leads.   Relevant CV Studies:  Echocardiogram:  02/19/2023 IMPRESSIONS     1. Left ventricular ejection fraction, by estimation, is 55 to 60%. The  left ventricle has normal function. The left ventricle has no regional  wall motion abnormalities. There is mild concentric left ventricular  hypertrophy. Left ventricular diastolic  parameters were normal.   2. Right ventricular systolic function is normal. The right ventricular  size is normal. There is mildly elevated pulmonary artery systolic  pressure. The estimated right ventricular systolic pressure is 39.5 mmHg.   3. The mitral valve is grossly normal. Trivial mitral valve  regurgitation.   4. The aortic valve is tricuspid. Aortic valve regurgitation is not  visualized.   5. The inferior vena cava is normal in size with greater than 50%  respiratory variability, suggesting right atrial pressure of 3 mmHg.   Comparison(s): Prior images reviewed side by side. LVEF remains normal  range at 55-60%.   Laboratory Data:  High Sensitivity Troponin:   Recent Labs  Lab 02/19/23 0337 02/19/23 0533 02/19/23 0732 02/19/23 0924  TROPONINIHS 3 3 3 3       Chemistry Recent Labs  Lab 03/01/23 1952  NA 140  K 4.5  CL 105  GLUCOSE 262*  BUN 17  CREATININE 1.10    No results for input(s): "PROT", "ALBUMIN", "AST", "ALT", "ALKPHOS", "BILITOT" in the last 168 hours. Lipids No results for input(s): "CHOL", "TRIG", "HDL", "LABVLDL", "LDLCALC", "CHOLHDL" in the last 168 hours. Hematology Recent Labs  Lab 03/01/23 1934 03/01/23 1952  WBC 10.3  --   RBC 3.25*  --   HGB 10.5* 9.5*  HCT 32.2* 28.0*  MCV 99.1  --   MCH 32.3  --   MCHC 32.6  --   RDW 15.9*  --  PLT 340  --    Thyroid No results for input(s): "TSH", "FREET4" in the last 168 hours. BNPNo results for input(s): "BNP", "PROBNP" in the last 168 hours.  DDimer No results for input(s): "DDIMER" in the last 168 hours.   Radiology/Studies:  No results found.   Assessment and Plan:   # Acute Anterolateral STEMI #  HTN # HLD # Type 2 DM  -Had LAD stent 2 weeks ago placed due to atypical CP -Now coming with acute STEMI. Has not taken Brilinta since 2 weeks. Concern for stent thrombosis -Emergent cath lab -Echo in AM -IV Heparin given in ED -Resume Aspirin and Brilinta post Cath -Atorvastatin 80 mg -Bisoprolol 5 mg daily.  -Losartan -Insulin per protocol   For questions or updates, please contact [facility] Please consult [email or web address] for contact info under     Signed, Hermelinda Dellen, MD  03/01/2023 7:59 PM    I have examined the patient and reviewed assessment and plan and discussed with patient.  Agree with above as stated.    I personally reviewed the ECG and made the decision for emergency cardiac cath.   Cath showed:  Lat 1st Mrg lesion is 50% stenosed.   Mid LAD lesion is 100% stenosed.  Balloon angioplasty was performed using a BALL SAPPHIRE NC24 3.25X18.   Post intervention, there is a 0% residual stenosis.   1st Diag lesion is 80% stenosed.  Balloon angioplasty was performed using a BALLN EMERGE MR 2.0X12.   Post intervention, there is a 20% residual stenosis.   There is severe left ventricular systolic dysfunction.   LV end diastolic pressure is moderately elevated.   The left ventricular ejection fraction is less than 25% by visual estimate.   There is no aortic valve stenosis.   In the absence of any other complications or medical issues, we expect the patient to be ready for discharge from an interventional cardiology perspective on 03/04/2023.   Recommend uninterrupted dual antiplatelet therapy with Aspirin 81mg  daily and Prasugrel 10mg  daily for a minimum of 12 months (ACS-Class I recommendation).   Tortuous right subclavian.  Longer guide needed for the left.  EBU 3.75 was sent for.   Successful PTCA of the acute stent thrombosis in the mid LAD stent.  Successful PTCA of the ostial diagonal branch.   It appears that the patient was unable to afford Brilinta  at the time of hospital discharge.  Another prescription has been sent to his home pharmacy.  They did not have the medicine in stock and it was going to take them 30 days to get it according to the patient.  He has been without his Brilinta.  Going forward, he will need to be discharged on either clopidogrel or prasugrel since both options should be less expensive.  We did load him with Brilinta in the Cath Lab today prior to knowing this history.  Tomorrow, would verify cost and if possible, treat with Effient 60 mg x 1.  After that, he will need Effient 10 mg daily.  He will need aggressive diuresis as well.  40 mg of Lasix IV was given after the case in the Cath Lab.  He will need medical therapy for LV dysfunction as well.   In addition, he had severe right forearm bruising from the prior catheterization.  There is no hematoma but bruising spreading across the medial forearm.  He will be transferred to the CCU.       Lance Muss

## 2023-03-01 NOTE — ED Provider Notes (Signed)
[place] EMERGENCY DEPARTMENT AT [facility] Provider Note   CSN: [id number] Arrival date & time: 03/01/23  1927     History  Chief Complaint  Patient presents with   Code STEMI    Douglas Mason is a 72 y.o. male.  The history is provided by the patient and medical records. No language interpreter was used.  Chest Pain Pain location:  L chest Pain quality: aching, crushing, dull and pressure   Pain radiates to:  L arm Pain severity:  Severe Onset quality:  Sudden Duration:  3 hours Timing:  Constant Progression:  Worsening Chronicity:  New Relieved by:  Nothing Worsened by:  Nothing Associated symptoms: diaphoresis, nausea, shortness of breath and vomiting   Associated symptoms: no abdominal pain and no back pain        Home Medications Prior to Admission medications   Medication Sig Start Date End Date Taking? Authorizing Provider  albuterol (PROVENTIL HFA;VENTOLIN HFA) 108 (90 BASE) MCG/ACT inhaler Inhale 2 puffs into the lungs every 4 (four) hours as needed for wheezing or shortness of breath. 06/28/14   Raeford Razor, MD  albuterol (PROVENTIL) (2.5 MG/3ML) 0.083% nebulizer solution Take 3 mLs (2.5 mg total) by nebulization every 6 (six) hours as needed for wheezing or shortness of breath. 08/18/14   Parrett, Virgel Bouquet, NP  ALPRAZolam Prudy Feeler) 0.5 MG tablet Take 0.5 mg by mouth at bedtime as needed for anxiety. 01/12/14   [provider]  aspirin EC 81 MG tablet Take 1 tablet (81 mg total) by mouth daily. 02/21/23   Burnadette Pop, MD  atorvastatin (LIPITOR) 80 MG tablet Take 1 tablet (80 mg total) by mouth daily. 02/22/23   Burnadette Pop, MD  bisoprolol (ZEBETA) 5 MG tablet Take 1 tablet (5 mg total) by mouth daily. 02/21/23   Burnadette Pop, MD  glipiZIDE (GLUCOTROL XL) 10 MG 24 hr tablet Take 1 tablet by mouth 2 (two) times a day. 12/04/18   [provider]  HYDROcodone-acetaminophen (NORCO/VICODIN) 5-325 MG tablet Take 1 tablet by  mouth at bedtime as needed for pain. 09/05/21   [provider]  JARDIANCE 25 MG TABS tablet Take 25 mg by mouth daily. Patient not taking: Reported on 02/19/2023 05/17/21   [provider]  losartan (COZAAR) 25 MG tablet Take 1 tablet (25 mg total) by mouth daily. 02/21/23   Burnadette Pop, MD  Magnesium Oxide 400 MG CAPS Take 1 capsule (400 mg total) by mouth daily. 11/12/22   Strader, Lennart Pall, PA-C  metFORMIN (GLUCOPHAGE-XR) 500 MG 24 hr tablet Take 2 tablets (1,000 mg total) by mouth 2 (two) times a day. 01/16/19   Lucretia Roers, MD  Multiple Vitamins-Minerals (MULTIVITAMINS THER. W/MINERALS) TABS tablet Take 1 tablet by mouth daily.    [provider]  pantoprazole (PROTONIX) 40 MG tablet Take 1 tablet (40 mg total) by mouth 2 (two) times daily before a meal. Patient taking differently: Take 40 mg by mouth at bedtime. 03/17/14   Lorenda Hatchet, MD  pregabalin (LYRICA) 50 MG capsule Take 50 mg by mouth 3 (three) times daily.    [provider]  ticagrelor (BRILINTA) 90 MG TABS tablet Take 1 tablet (90 mg total) by mouth 2 (two) times daily. 02/21/23   Burnadette Pop, MD  TOUJEO SOLOSTAR 300 UNIT/ML Solostar Pen Inject 12 Units into the skin daily. 01/24/23   [provider]      Allergies    Lisinopril    Review of Systems  Review of Systems  Constitutional:  Positive for diaphoresis.  Respiratory:  Positive for chest tightness and shortness of breath.   Cardiovascular:  Positive for chest pain.  Gastrointestinal:  Positive for nausea and vomiting. Negative for abdominal pain.  Musculoskeletal:  Negative for back pain.  Neurological:  Positive for light-headedness. Negative for syncope.  Psychiatric/Behavioral:  Negative for agitation.   All other systems reviewed and are negative.   Physical Exam Updated Vital Signs Pulse 68   Temp 98.3 F (36.8 C) (Oral)   Resp 18   Wt 107 kg   BMI 31.99 kg/m  Physical Exam Vitals and nursing  note reviewed.  Constitutional:      General: He is in acute distress.     Appearance: He is well-developed. He is ill-appearing and diaphoretic.  HENT:     Head: Normocephalic and atraumatic.     Nose: No congestion or rhinorrhea.     Mouth/Throat:     Mouth: Mucous membranes are moist.  Eyes:     Conjunctiva/sclera: Conjunctivae normal.     Pupils: Pupils are equal, round, and reactive to light.  Cardiovascular:     Rate and Rhythm: Normal rate and regular rhythm.     Heart sounds: No murmur heard. Pulmonary:     Effort: Pulmonary effort is normal. No respiratory distress.     Breath sounds: Normal breath sounds. No wheezing, rhonchi or rales.  Chest:     Chest wall: No tenderness.  Abdominal:     General: Abdomen is flat.     Palpations: Abdomen is soft.     Tenderness: There is no abdominal tenderness. There is no guarding or rebound.  Musculoskeletal:        General: No swelling or tenderness.     Cervical back: Neck supple.  Skin:    General: Skin is warm.     Capillary Refill: Capillary refill takes less than 2 seconds.     Findings: No erythema.  Neurological:     Mental Status: He is alert.  Psychiatric:        Mood and Affect: Mood normal.     ED Results / Procedures / Treatments   Labs (all labs ordered are listed, but only abnormal results are displayed) Labs Reviewed  CBC WITH DIFFERENTIAL/PLATELET  COMPREHENSIVE METABOLIC PANEL  TROPONIN I (HIGH SENSITIVITY)    EKG EKG Interpretation Date/Time:  Saturday March 01 2023 19:29:24 EDT Ventricular Rate:  67 PR Interval:  194 QRS Duration:  100 QT Interval:  388 QTC Calculation: 410 R Axis:   -17  Text Interpretation: Sinus rhythm Borderline left axis deviation Low voltage, precordial leads Abnormal R-wave progression, early transition Borderline ST elevation, anterior leads Baseline wander in lead(s) I aVL V2 V3 when compared top rior, some ST changes present. Confirmed by Theda Belfast ([id number]) on  03/01/2023 7:40:30 PM  Radiology No results found.  Procedures Procedures    Medications Ordered in ED Medications - No data to display  ED Course/ Medical Decision Making/ A&P                             Medical Decision Making   TEREZ MILLARD is a 72 y.o. male with a past medical history significant for hypertension, diabetes, hypercholesterolemia, GERD, and recent MI with LAD PCI who presents with chest pain concerning for STEMI.  According to the EMS report, patient had onset of chest pain about 3:30 PM that felt like  his previous MI.  It is crushing pain in his left chest that goes down his left arm into his left shoulder.  He had associated diaphoresis, shortness of breath, nausea, vomiting, and lightheadedness.  Patient transmitted EKG and it was subsequently activated as a STEMI.  Patient was given nitroglycerin and aspirin and Zofran.  On arrival, cardiology is already the bedside.  They feel this is a STEMI and will take him directly to the Cath Lab.  I briefly examined patient and his lungs had clear breath sounds and his chest did not appear to be tender to palpation.  Abdomen was nontender.  He had bruising in extremities but had intact pulses in upper extremities.  Legs not focally tender on initial exam.  Patient mentating well and answering questions appropriately.  Will order screening labs but patient will be taken to Cath Lab for cardiac intervention.  Patient will be admitted by cardiology following.           Final Clinical Impression(s) / ED Diagnoses Final diagnoses:  ST elevation myocardial infarction (STEMI), unspecified artery (HCC)    Clinical Impression: 1. ST elevation myocardial infarction (STEMI), unspecified artery (HCC)     Disposition: Admit  This note was prepared with assistance of Dragon voice recognition software. Occasional wrong-word or sound-a-like substitutions may have occurred due to the inherent limitations of voice  recognition software.     Zayli Villafuerte, Canary Brim, MD 03/01/23 [phone]

## 2023-03-01 NOTE — ED Triage Notes (Signed)
Pt had stent placed 2 weeks ago

## 2023-03-01 NOTE — ED Triage Notes (Signed)
Pt BIB EMS for Code Stemi. Pt chest pain started at 330pm. EMS states pt started vomiting enroute to [facility] did EKG at that time and showed stemi. Pt states pain radiated to neck. 324 asa, 2 nitroglycerin and 4mg  zofran given.

## 2023-03-02 ENCOUNTER — Inpatient Hospital Stay (HOSPITAL_COMMUNITY): Payer: Medicare Other

## 2023-03-02 DIAGNOSIS — I10 Essential (primary) hypertension: Secondary | ICD-10-CM

## 2023-03-02 DIAGNOSIS — E118 Type 2 diabetes mellitus with unspecified complications: Secondary | ICD-10-CM

## 2023-03-02 DIAGNOSIS — I2109 ST elevation (STEMI) myocardial infarction involving other coronary artery of anterior wall: Secondary | ICD-10-CM | POA: Diagnosis not present

## 2023-03-02 DIAGNOSIS — I5021 Acute systolic (congestive) heart failure: Secondary | ICD-10-CM | POA: Diagnosis not present

## 2023-03-02 DIAGNOSIS — I219 Acute myocardial infarction, unspecified: Secondary | ICD-10-CM | POA: Diagnosis not present

## 2023-03-02 LAB — ECHOCARDIOGRAM COMPLETE
Area-P 1/2: 2.01 cm2
Height: 72 in
S' Lateral: 4.1 cm
Weight: 3813.08 oz

## 2023-03-02 LAB — BASIC METABOLIC PANEL
Anion gap: 9 (ref 5–15)
BUN: 16 mg/dL (ref 8–23)
CO2: 24 mmol/L (ref 22–32)
Calcium: 8.8 mg/dL — ABNORMAL LOW (ref 8.9–10.3)
Chloride: 106 mmol/L (ref 98–111)
Creatinine, Ser: 1.26 mg/dL — ABNORMAL HIGH (ref 0.61–1.24)
GFR, Estimated: >60 mL/min (ref >60)
Glucose, Bld: 146 mg/dL — ABNORMAL HIGH (ref 70–99)
Potassium: 4.7 mmol/L (ref 3.5–5.1)
Sodium: 139 mmol/L (ref 135–145)

## 2023-03-02 LAB — CBC
HCT: 31.8 % — ABNORMAL LOW (ref 39.0–52.0)
Hemoglobin: 10.5 g/dL — ABNORMAL LOW (ref 13.0–17.0)
MCH: 31.3 pg (ref 26.0–34.0)
MCHC: 33 g/dL (ref 30.0–36.0)
MCV: 94.6 fL (ref 80.0–100.0)
Platelets: 339 10*3/uL (ref 150–400)
RBC: 3.36 MIL/uL — ABNORMAL LOW (ref 4.22–5.81)
RDW: 16 % — ABNORMAL HIGH (ref 11.5–15.5)
WBC: 10.7 10*3/uL — ABNORMAL HIGH (ref 4.0–10.5)
nRBC: 0 % (ref 0.0–0.2)

## 2023-03-02 LAB — GLUCOSE, CAPILLARY
Glucose-Capillary: 197 mg/dL — ABNORMAL HIGH (ref 70–99)
Glucose-Capillary: 246 mg/dL — ABNORMAL HIGH (ref 70–99)
Glucose-Capillary: 185 mg/dL — ABNORMAL HIGH (ref 70–99)

## 2023-03-02 LAB — BRAIN NATRIURETIC PEPTIDE: B Natriuretic Peptide: 142 pg/mL — ABNORMAL HIGH (ref 0.0–100.0)

## 2023-03-02 MED ORDER — FUROSEMIDE 10 MG/ML IJ SOLN
40.0000 mg | Freq: Once | INTRAMUSCULAR | Status: AC
  Administered 2023-03-02: 40 mg via INTRAVENOUS
  Filled 2023-03-02: qty 4

## 2023-03-02 MED ORDER — INSULIN ASPART 100 UNIT/ML IJ SOLN: 0.0000 [IU] | Freq: Every day | INTRAMUSCULAR | Status: DC

## 2023-03-02 MED ORDER — MORPHINE SULFATE (PF) 2 MG/ML IV SOLN: 1.0000 mg | Freq: Four times a day (QID) | INTRAVENOUS | Status: DC | PRN

## 2023-03-02 MED ORDER — INSULIN ASPART 100 UNIT/ML IJ SOLN
0.0000 [IU] | Freq: Three times a day (TID) | INTRAMUSCULAR | Status: DC
  Administered 2023-03-02: 3 [IU] via SUBCUTANEOUS
  Administered 2023-03-02: 5 [IU] via SUBCUTANEOUS
  Administered 2023-03-03 (×2): 2 [IU] via SUBCUTANEOUS
  Administered 2023-03-03: 3 [IU] via SUBCUTANEOUS
  Administered 2023-03-04 (×2): 2 [IU] via SUBCUTANEOUS

## 2023-03-02 MED ORDER — HEPARIN SODIUM (PORCINE) 5000 UNIT/ML IJ SOLN
5000.0000 [IU] | Freq: Three times a day (TID) | INTRAMUSCULAR | Status: DC
  Administered 2023-03-02 – 2023-03-03 (×4): 5000 [IU] via SUBCUTANEOUS
  Filled 2023-03-02 (×3): qty 1

## 2023-03-02 MED ORDER — PERFLUTREN LIPID MICROSPHERE
1.0000 mL | INTRAVENOUS | Status: AC | PRN
  Administered 2023-03-02: 10 mL via INTRAVENOUS

## 2023-03-02 MED ORDER — ISOSORBIDE MONONITRATE ER 30 MG PO TB24
30.0000 mg | ORAL_TABLET | Freq: Every day | ORAL | Status: DC
  Administered 2023-03-02 – 2023-03-04 (×3): 30 mg via ORAL
  Filled 2023-03-02 (×3): qty 1

## 2023-03-02 MED ORDER — HEPARIN SODIUM (PORCINE) 5000 UNIT/ML IJ SOLN
5000.0000 [IU] | Freq: Three times a day (TID) | INTRAMUSCULAR | Status: DC
  Administered 2023-03-02: 5000 [IU] via SUBCUTANEOUS
  Filled 2023-03-02 (×2): qty 1

## 2023-03-02 MED ORDER — SPIRONOLACTONE 12.5 MG HALF TABLET
12.5000 mg | ORAL_TABLET | Freq: Every day | ORAL | Status: DC
  Administered 2023-03-02 – 2023-03-04 (×3): 12.5 mg via ORAL
  Filled 2023-03-02 (×3): qty 1

## 2023-03-02 MED ORDER — NITROGLYCERIN 0.4 MG SL SUBL: 0.4000 mg | SUBLINGUAL_TABLET | SUBLINGUAL | Status: DC | PRN

## 2023-03-02 NOTE — Progress Notes (Signed)
Echocardiogram 2D Echocardiogram has been performed.  Warren Lacy Careli Luzader RDCS 03/02/2023, 10:22 AM

## 2023-03-02 NOTE — Progress Notes (Signed)
Cardiology Progress Note  Patient ID: Douglas Mason MRN: [id number] DOB: August 25, 1951 Date of Encounter: 03/02/2023  Primary Cardiologist: Christell Constant, MD  Subjective   Chief Complaint: CP  HPI: Complaints of chest pain this morning.  EKG shows his ST elevation has resolved.  Orders for nitroglycerin and morphine placed.  Reports shortness of breath with laying flat.  Echo pending.  BNP ordered.  ROS:  All other ROS reviewed and negative. Pertinent positives noted in the HPI.     Inpatient Medications  Scheduled Meds:  aspirin  81 mg Oral Daily   atorvastatin  80 mg Oral Daily   bisoprolol  5 mg Oral Daily   Chlorhexidine Gluconate Cloth  6 each Topical Daily   empagliflozin  25 mg Oral Daily   insulin aspart  0-15 Units Subcutaneous TID WC   insulin aspart  0-5 Units Subcutaneous QHS   insulin glargine-yfgn  12 Units Subcutaneous Daily   isosorbide mononitrate  30 mg Oral Daily   losartan  25 mg Oral Daily   magnesium oxide  400 mg Oral Daily   multivitamin with minerals  1 tablet Oral Daily   pantoprazole  40 mg Oral QHS   pregabalin  50 mg Oral TID   sodium chloride flush  3 mL Intravenous Q12H   spironolactone  12.5 mg Oral Daily   ticagrelor  90 mg Oral BID   Continuous Infusions:  sodium chloride     tirofiban 0.15 mcg/kg/min (03/02/23 0600)   PRN Meds: sodium chloride, acetaminophen, albuterol, ALPRAZolam, HYDROcodone-acetaminophen, morphine injection, nitroGLYCERIN, ondansetron (ZOFRAN) IV, sodium chloride flush   Vital Signs   Vitals:   03/02/23 0500 03/02/23 0600 03/02/23 0700 03/02/23 0715  BP: 137/73 (!) 140/81 133/82   Pulse: 69 76 74 80  Resp: 20 (!) 21 (!) 22 (!) 22  Temp:    98.1 F (36.7 C)  TempSrc:    Oral  SpO2: 97% 96% 96% 95%  Weight: 108.1 kg     Height:        Intake/Output Summary (Last 24 hours) at 03/02/2023 0751 Last data filed at 03/02/2023 0600 Gross per 24 hour  Intake 436.11 ml  Output 1200 ml  Net -763.89 ml       03/02/2023    5:00 AM 03/01/2023    9:00 PM 03/01/2023    7:32 PM  Last 3 Weights  Weight (lbs) 238 lb 5.1 oz 238 lb 8.6 oz 235 lb 14.3 oz  Weight (kg) 108.1 kg 108.2 kg 107 kg      Telemetry  Overnight telemetry shows SR 90s, which I personally reviewed.   ECG  The most recent ECG shows SR 78, inferior infarct, poor R wave progression, which I personally reviewed.   Physical Exam   Vitals:   03/02/23 0500 03/02/23 0600 03/02/23 0700 03/02/23 0715  BP: 137/73 (!) 140/81 133/82   Pulse: 69 76 74 80  Resp: 20 (!) 21 (!) 22 (!) 22  Temp:    98.1 F (36.7 C)  TempSrc:    Oral  SpO2: 97% 96% 96% 95%  Weight: 108.1 kg     Height:        Intake/Output Summary (Last 24 hours) at 03/02/2023 0751 Last data filed at 03/02/2023 0600 Gross per 24 hour  Intake 436.11 ml  Output 1200 ml  Net -763.89 ml       03/02/2023    5:00 AM 03/01/2023    9:00 PM 03/01/2023    7:32 PM  Last 3 Weights  Weight (lbs) 238 lb 5.1 oz 238 lb 8.6 oz 235 lb 14.3 oz  Weight (kg) 108.1 kg 108.2 kg 107 kg    Body mass index is 32.32 kg/m.  General: Well nourished, well developed, in no acute distress Head: Atraumatic, normal size  Eyes: PEERLA, EOMI  Neck: Supple, no JVD Endocrine: No thryomegaly Cardiac: Normal S1, S2; RRR; no murmurs, rubs, or gallops Lungs: Clear to auscultation bilaterally, no wheezing, rhonchi or rales  Abd: Soft, nontender, no hepatomegaly  Ext: No edema, pulses 2+ Musculoskeletal: No deformities, BUE and BLE strength normal and equal Skin: Warm and dry, no rashes   Neuro: Alert and oriented to person, place, time, and situation, CNII-XII grossly intact, no focal deficits  Psych: Normal mood and affect   Labs  High Sensitivity Troponin:   Recent Labs  Lab 02/19/23 0533 02/19/23 0732 02/19/23 0924 03/01/23 1934 03/01/23 2124  TROPONINIHS 3 3 3  37* >24,000*     Cardiac EnzymesNo results for input(s): "TROPONINI" in the last 168 hours. No results for input(s):  "TROPIPOC" in the last 168 hours.  Chemistry Recent Labs  Lab 03/01/23 1934 03/01/23 1952 03/02/23 0107  NA 138 140 139  K 4.5 4.5 4.7  CL 107 105 106  CO2 21*  --  24  GLUCOSE 263* 262* 146*  BUN 16 17 16   CREATININE 1.28* 1.10 1.26*  CALCIUM 8.7*  --  8.8*  PROT 6.5  --   --   ALBUMIN 3.7  --   --   AST 31  --   --   ALT 39  --   --   ALKPHOS 81  --   --   BILITOT 0.7  --   --   GFRNONAA 59*  --  >60  ANIONGAP 10  --  9    Hematology Recent Labs  Lab 03/01/23 1934 03/01/23 1952 03/02/23 0107  WBC 10.3  --  10.7*  RBC 3.25*  --  3.36*  HGB 10.5* 9.5* 10.5*  HCT 32.2* 28.0* 31.8*  MCV 99.1  --  94.6  MCH 32.3  --  31.3  MCHC 32.6  --  33.0  RDW 15.9*  --  16.0*  PLT 340  --  339   BNPNo results for input(s): "BNP", "PROBNP" in the last 168 hours.  DDimer No results for input(s): "DDIMER" in the last 168 hours.   Radiology  CARDIAC CATHETERIZATION  Result Date: 03/01/2023   Lat 1st Mrg lesion is 50% stenosed.   Mid LAD lesion is 100% stenosed.  Balloon angioplasty was performed using a BALL SAPPHIRE NC24 3.25X18.   Post intervention, there is a 0% residual stenosis.   1st Diag lesion is 80% stenosed.  Balloon angioplasty was performed using a BALLN EMERGE MR 2.0X12.   Post intervention, there is a 20% residual stenosis.   There is severe left ventricular systolic dysfunction.   LV end diastolic pressure is moderately elevated.   The left ventricular ejection fraction is less than 25% by visual estimate.   There is no aortic valve stenosis.   In the absence of any other complications or medical issues, we expect the patient to be ready for discharge from an interventional cardiology perspective on 03/04/2023.   Recommend uninterrupted dual antiplatelet therapy with Aspirin 81mg  daily and Prasugrel 10mg  daily for a minimum of 12 months (ACS-Class I recommendation).   Tortuous right subclavian.  Longer guide needed for the left.  EBU 3.75 was sent for. Successful  PTCA of the  acute stent thrombosis in the mid LAD stent.  Successful PTCA of the ostial diagonal branch. It appears that the patient was unable to afford Brilinta at the time of hospital discharge.  Another prescription has been sent to his home pharmacy.  They did not have the medicine in stock and it was going to take them 30 days to get it according to the patient.  He has been without his Brilinta.  Going forward, he will need to be discharged on either clopidogrel or prasugrel since both options should be less expensive.  We did load him with Brilinta in the Cath Lab today prior to knowing this history.  Tomorrow, would verify cost and if possible, treat with Effient 60 mg x 1.  After that, he will need Effient 10 mg daily.  He will need aggressive diuresis as well.  40 mg of Lasix IV was given after the case in the Cath Lab.  He will need medical therapy for LV dysfunction as well. In addition, he had severe right forearm bruising from the prior catheterization.  There is no hematoma but bruising spreading across the medial forearm.  He will be transferred to the CCU.    Cardiac Studies  LHC 03/01/2023   Lat 1st Mrg lesion is 50% stenosed.   Mid LAD lesion is 100% stenosed.  Balloon angioplasty was performed using a BALL SAPPHIRE NC24 3.25X18.   Post intervention, there is a 0% residual stenosis.   1st Diag lesion is 80% stenosed.  Balloon angioplasty was performed using a BALLN EMERGE MR 2.0X12.   Post intervention, there is a 20% residual stenosis.   There is severe left ventricular systolic dysfunction.   LV end diastolic pressure is moderately elevated.   The left ventricular ejection fraction is less than 25% by visual estimate.   There is no aortic valve stenosis.   In the absence of any other complications or medical issues, we expect the patient to be ready for discharge from an interventional cardiology perspective on 03/04/2023.   Recommend uninterrupted dual antiplatelet therapy with Aspirin 81mg   daily and Prasugrel 10mg  daily for a minimum of 12 months (ACS-Class I recommendation).   Tortuous right subclavian.  Longer guide needed for the left.  EBU 3.75 was sent for.  Patient Profile  Douglas Mason is a 72 y.o. male with diabetes, hypertension, OSA, CAD (recent PCI to mid LAD 02/20/2023) who was admitted on 03/01/2023 for anterior STEMI.  He had been out of Brilinta.  Assessment & Plan   # Anterior STEMI # Acute stent thrombosis # Acute systolic heart failure, EF 25% -Patient was just discharged from the hospital with PCI to the mid LAD and balloon angioplasty to the first diagonal.  He was unable to get Brilinta.  Now back with acute stent thrombosis and anterior STEMI.  Status post PCI to the proximal to mid LAD yesterday. -Reports chest pain this morning.  EKG shows ST elevation has resolved.  Suspect this is just due to residual thrombus distally.  Plan is to continue Aggrastat for 18 hours.  This will stop later today. -Interventional cardiology recommended Brilinta.  We will then price Plavix and prasugrel tomorrow.  He will then be switched to 1 of those agents which hopefully are more affordable. -Will give an additional dose of 40 mg IV Lasix.  He is on bisoprolol 5 mg daily, Jardiance 25 mg daily, losartan 25 mg daily.  Add Aldactone 12.5 mg daily.  Likely transition  to [facility] tomorrow. -Recheck echo.  Continue high intensity statin.  He is on aspirin in addition to Brilinta currently.  See discussion above. -I will also add Imdur for his chest discomfort.  Morphine was also ordered.  # DM -Sliding scale insulin ordered.  A1c 6.0.  # HTN -as above   FEN -no IVF -code full -dvt ppx: subcutaneous heparin -diet: heart heatlhy -disposition: remain in ICU today   For questions or updates, please contact [facility] Please consult [email or web address] for contact info under   CRITICAL CARE Performed by: Gerri Spore T O'Neal  Total critical care time: 45 minutes.  Critical care time was exclusive of separately billable procedures and treating other patients. Critical care was necessary to treat or prevent imminent or life-threatening deterioration. Critical care was time spent personally by me on the following activities: development of treatment plan with patient and/or surrogate as well as nursing, discussions with consultants, evaluation of patient's response to treatment, examination of patient, obtaining history from patient or surrogate, ordering and performing treatments and interventions, ordering and review of laboratory studies, ordering and review of radiographic studies, pulse oximetry and re-evaluation of patient's condition.   Signed, Lenna Gilford. Flora Lipps, MD, [facility]  03/02/2023 8:01 AM

## 2023-03-02 NOTE — Progress Notes (Signed)
Pt complaining of worsening CP with radiation to L arm- MD and on call PA Dayna made aware.EKG performed, antinausea meds given.  Dr Scharlene Gloss at bedside, made aware/ see new orders.

## 2023-03-02 NOTE — Progress Notes (Signed)
eLink Physician-Brief Progress Note Patient Name: Douglas Mason DOB: December 21, 1950 MRN: [id number]   Date of Service  03/02/2023  HPI/Events of Note  Acute STEMI from in-stent thrombosis in patient who did not take her Brilinta post PCI.  eICU Interventions  Admission reviewed.     Intervention Category Evaluation Type: New Patient Evaluation  Carilyn Goodpasture 03/02/2023, 3:46 AM

## 2023-03-03 ENCOUNTER — Encounter (HOSPITAL_COMMUNITY): Payer: Self-pay | Admitting: Interventional Cardiology

## 2023-03-03 ENCOUNTER — Other Ambulatory Visit (HOSPITAL_COMMUNITY): Payer: Self-pay

## 2023-03-03 DIAGNOSIS — I2109 ST elevation (STEMI) myocardial infarction involving other coronary artery of anterior wall: Secondary | ICD-10-CM | POA: Diagnosis not present

## 2023-03-03 DIAGNOSIS — I255 Ischemic cardiomyopathy: Secondary | ICD-10-CM | POA: Diagnosis not present

## 2023-03-03 LAB — CBC
HCT: 30.7 % — ABNORMAL LOW (ref 39.0–52.0)
Hemoglobin: 10.1 g/dL — ABNORMAL LOW (ref 13.0–17.0)
MCH: 31.5 pg (ref 26.0–34.0)
MCHC: 32.9 g/dL (ref 30.0–36.0)
MCV: 95.6 fL (ref 80.0–100.0)
Platelets: 308 10*3/uL (ref 150–400)
RBC: 3.21 MIL/uL — ABNORMAL LOW (ref 4.22–5.81)
RDW: 16.3 % — ABNORMAL HIGH (ref 11.5–15.5)
WBC: 15.4 10*3/uL — ABNORMAL HIGH (ref 4.0–10.5)
nRBC: 0 % (ref 0.0–0.2)

## 2023-03-03 LAB — BASIC METABOLIC PANEL
Anion gap: 10 (ref 5–15)
BUN: 20 mg/dL (ref 8–23)
CO2: 28 mmol/L (ref 22–32)
Calcium: 8.6 mg/dL — ABNORMAL LOW (ref 8.9–10.3)
Chloride: 98 mmol/L (ref 98–111)
Creatinine, Ser: 1.44 mg/dL — ABNORMAL HIGH (ref 0.61–1.24)
GFR, Estimated: 51 mL/min — ABNORMAL LOW (ref >60)
Glucose, Bld: 144 mg/dL — ABNORMAL HIGH (ref 70–99)
Potassium: 4.5 mmol/L (ref 3.5–5.1)
Sodium: 136 mmol/L (ref 135–145)

## 2023-03-03 LAB — GLUCOSE, CAPILLARY
Glucose-Capillary: 141 mg/dL — ABNORMAL HIGH (ref 70–99)
Glucose-Capillary: 126 mg/dL — ABNORMAL HIGH (ref 70–99)
Glucose-Capillary: 168 mg/dL — ABNORMAL HIGH (ref 70–99)
Glucose-Capillary: 147 mg/dL — ABNORMAL HIGH (ref 70–99)
Glucose-Capillary: 182 mg/dL — ABNORMAL HIGH (ref 70–99)

## 2023-03-03 LAB — POCT ACTIVATED CLOTTING TIME: Activated Clotting Time: 330 seconds

## 2023-03-03 MED ORDER — PRASUGREL HCL 10 MG PO TABS: 10.0000 mg | ORAL_TABLET | Freq: Every day | ORAL | Status: DC

## 2023-03-03 MED ORDER — PRASUGREL HCL 10 MG PO TABS
60.0000 mg | ORAL_TABLET | Freq: Once | ORAL | Status: AC
  Administered 2023-03-04: 60 mg via ORAL
  Filled 2023-03-03: qty 6

## 2023-03-03 NOTE — Progress Notes (Signed)
Patient transferred to 6East floor in stable condition, report was given to receiving RN.

## 2023-03-03 NOTE — TOC CM/SW Note (Addendum)
Transition of Care [facility]) - Inpatient Brief Assessment   Patient Details  Name: TIMATHY MIXELL MRN: [id number] Date of Birth: 1951/03/08  Transition of Care [facility]) CM/SW Contact:    Gala Lewandowsky, RN Phone Number: 03/03/2023, 10:19 AM   Clinical Narrative: Patient presented for chest pain- anterior stemi. Patient plans to return home with a Life Vest. Case Manager has submitted orders and is awaiting insurance authorization. Patient will need to be fit prior to d/c home. Case Manager will continue to follow for transition of care needs as the patient progresses.     Insurance approved the [employer]- patient will be fit later this evening.   Transition of Care Asessment: Insurance and Status: Insurance coverage has been reviewed Patient has primary care physician: Yes Home environment has been reviewed: reviewed Prior level of function:: independent Prior/Current Home Services: No current home services Social Determinants of Health Reivew: SDOH reviewed no interventions necessary Readmission risk has been reviewed: Yes Transition of care needs: transition of care needs identified, TOC will continue to follow

## 2023-03-03 NOTE — Progress Notes (Signed)
   Heart Failure Stewardship Pharmacist Progress Note   PCP: Benita Stabile, MD PCP-Cardiologist: Christell Constant, MD    HPI:  Douglas Mason with PMH of HTN, T2DM, HLD, GERD, and CAD.   Recently admitted from 6/19-6/21 with chest pain. Taken to cath lab and found to have 85% stenosis at mid LAD s/p DES.   Presented to the ED on 6/29 with chest pain, shortness of breath, and diaphoresis. EKG with ST elevations. Code STEMI called.   Reports he was not taking Brilinta since discharge. It was filled at [facility] pharmacy but copay was >$500 so patient refused. Prescription was then sent to [facility] and copay was $203. Rx was never filled.   Taken to cath and found to have acute stent thrombosis and anterior STEMI. S/p PCI to prox to mid LAD. EF estimated <25%. Formal ECHO done 6/30 and LVEF 30-35% (was 60-65% 2 weeks ago), regional wall motion abnormalities, G1DD, RV normal, trivial MR.   Current HF Medications: Beta Blocker: bisoprolol 5 mg daily ACE/ARB/ARNI: losartan 25 mg daily MRA: spironolactone 12.5 mg daily SGLT2i: Jardiance 25 mg daily  Prior to admission HF Medications: Beta blocker: bisoprolol 5 mg daily ACE/ARB/ARNI: losartan 25 mg daily  Pertinent Lab Values: Serum creatinine 1.44, BUN 20, Potassium 4.5, Sodium 136, BNP 142,  A1c 6.0   Vital Signs: Weight: 230 lbs (admission weight: 238 lbs) Blood pressure: 100/60s  Heart rate: 80s  I/O: -1L yesterday; net -1.2L  Medication Assistance / Insurance Benefits Check: Does the patient have prescription insurance?  Yes Type of insurance plan: Medicare  Outpatient Pharmacy:  Prior to admission outpatient pharmacy: [facility] Is the patient willing to use [facility] TOC pharmacy at discharge? Yes Is the patient willing to transition their outpatient pharmacy to utilize a [facility] outpatient pharmacy?   Yes - transitioned to [facility] mail order    Assessment: 1. Acute systolic CHF (LVEF 30-35%), due to ICM. NYHA class II  symptoms. - Not volume overloaded on exam. No indications for diuretics.  - Continue bisoprolol 5 mg daily - Continue losartan 25 mg daily - Continue spironolactone 12.5 mg daily  - Continue Jardiance 25 mg daily  Plan: 1) Medication changes recommended at this time: - Consider switching from Brilinta to prasugrel  2) Patient assistance: - Has $56 deductible remaining on insurance  - Jardiance copay $103 initially; $47 once deductible is met - Entresto copay $103 initially; $47 once deductible is met - Brilinta copay $200 initially; $143 once deductible is met - Prasugrel copay $6.94 - Clopidogrel copay $2.12 - Given multiple issues with access from [facility] pharmacy, discussed with patient, wife, and daughter and decision was made to get discharge medications from [facility] pharmacy at discharge and then to have medications mailed to his house moving forward. Instructed for them to call the number listed on the medication bottle about a week before he is out of medications and then request to have refill be mailed to his home.   3)  Education  - Patient has been educated on current HF medications and potential additions to HF medication regimen - Patient verbalizes understanding that over the next few months, these medication doses may change and more medications may be added to optimize HF regimen - Patient has been educated on basic disease state pathophysiology and goals of therapy   Sharen Hones, PharmD, BCPS Heart Failure Stewardship Pharmacist Phone [phone]

## 2023-03-03 NOTE — Progress Notes (Signed)
Heart Failure Navigator Progress Note  Assessed for Heart & Vascular TOC clinic readiness.  Patient declined Hf TOC appointment at this time. Lives in [place] and has a hospital follow up on 03/07/2023.  Education was done on heart failure sign and symptoms, daily weights, when to call his doctor or go to the ED, Diet/ fluid restrictions, taking all medications as prescribed and attending all medical appointments.    Navigator will sign off at this time.    Rhae Hammock, BSN, Scientist, clinical (histocompatibility and immunogenetics) Only

## 2023-03-03 NOTE — Progress Notes (Signed)
CARDIAC REHAB PHASE I   PRE:  Rate/Rhythm: 78 SR  BP:  Sitting: 94/67      SaO2: 99 RA  MODE:  Ambulation: 150 ft   POST:  Rate/Rhythm: 82 SR  BP:  Sitting: 95/57      SaO2: 97 RA   Pt ambulated in hall, tolerated well with no CP, SOB or dizziness. Returned to bed with call bell and bedside table in reach. Post MI/angioplasty education including site care, restrictions, risk factors, exercise guidelines, NTG use, heart healthy diabetic diet, antiplatelet therapy importance, MI booklet and CRP2 reviewed. All questions and concerns addressed. Will refer to AP for CRP2. Will continue to follow.   1000-1050  Woodroe Chen, RN BSN 03/03/2023 10:46 AM

## 2023-03-03 NOTE — Progress Notes (Signed)
72 yo male who came in with anterior STEMI and has low EF of 30-35%. At risk for VT and VF. Discussed with Dr. Clifton James who recommended lifevest prior to DC tomorrow

## 2023-03-03 NOTE — Progress Notes (Addendum)
Cardiology Progress Note  Patient ID: Douglas Mason MRN: [id number] DOB: 07-Aug-1951 Date of Encounter: 03/02/2023  Primary Cardiologist: Christell Constant, MD  Subjective   No chest pain or dyspnea. He says he feels great.   Inpatient Medications  Scheduled Meds:  aspirin  81 mg Oral Daily   atorvastatin  80 mg Oral Daily   bisoprolol  5 mg Oral Daily   Chlorhexidine Gluconate Cloth  6 each Topical Daily   empagliflozin  25 mg Oral Daily   insulin aspart  0-15 Units Subcutaneous TID WC   insulin aspart  0-5 Units Subcutaneous QHS   insulin glargine-yfgn  12 Units Subcutaneous Daily   isosorbide mononitrate  30 mg Oral Daily   losartan  25 mg Oral Daily   magnesium oxide  400 mg Oral Daily   multivitamin with minerals  1 tablet Oral Daily   pantoprazole  40 mg Oral QHS   pregabalin  50 mg Oral TID   sodium chloride flush  3 mL Intravenous Q12H   spironolactone  12.5 mg Oral Daily   ticagrelor  90 mg Oral BID   Continuous Infusions:  sodium chloride     tirofiban 0.15 mcg/kg/min (03/02/23 0600)   PRN Meds: sodium chloride, acetaminophen, albuterol, ALPRAZolam, HYDROcodone-acetaminophen, morphine injection, nitroGLYCERIN, ondansetron (ZOFRAN) IV, sodium chloride flush   Vital Signs   Vitals:   03/02/23 0500 03/02/23 0600 03/02/23 0700 03/02/23 0715  BP: 137/73 (!) 140/81 133/82   Pulse: 69 76 74 80  Resp: 20 (!) 21 (!) 22 (!) 22  Temp:    98.1 F (36.7 C)  TempSrc:    Oral  SpO2: 97% 96% 96% 95%  Weight: 108.1 kg     Height:        Intake/Output Summary (Last 24 hours) at 03/02/2023 0751 Last data filed at 03/02/2023 0600 Gross per 24 hour  Intake 436.11 ml  Output 1200 ml  Net -763.89 ml      03/02/2023    5:00 AM 03/01/2023    9:00 PM 03/01/2023    7:32 PM  Last 3 Weights  Weight (lbs) 238 lb 5.1 oz 238 lb 8.6 oz 235 lb 14.3 oz  Weight (kg) 108.1 kg 108.2 kg 107 kg      Telemetry   Sinus -personally reviewed  ECG   No am EKG  Physical  Exam   General: Well developed, well nourished, NAD  HEENT: OP clear, mucus membranes moist  SKIN: warm, dry. No rashes. Neuro: No focal deficits  Musculoskeletal: Muscle strength 5/5 all ext  Psychiatric: Mood and affect normal  Neck: No JVD, no carotid bruits, no thyromegaly, no lymphadenopathy.  Lungs:Clear bilaterally, no wheezes, rhonci, crackles Cardiovascular: Regular rate and rhythm. No murmurs, gallops or rubs. Abdomen:Soft. Bowel sounds present. Non-tender.  Extremities: No lower extremity edema. Pulses are 2 + in the bilateral DP/PT.    Labs  High Sensitivity Troponin:   Recent Labs  Lab 02/19/23 0533 02/19/23 0732 02/19/23 0924 03/01/23 1934 03/01/23 2124  TROPONINIHS 3 3 3  37* >24,000*     Cardiac EnzymesNo results for input(s): "TROPONINI" in the last 168 hours. No results for input(s): "TROPIPOC" in the last 168 hours.  Chemistry Recent Labs  Lab 03/01/23 1934 03/01/23 1952 03/02/23 0107  NA 138 140 139  K 4.5 4.5 4.7  CL 107 105 106  CO2 21*  --  24  GLUCOSE 263* 262* 146*  BUN 16 17 16   CREATININE 1.28* 1.10 1.26*  CALCIUM 8.7*  --  8.8*  PROT 6.5  --   --   ALBUMIN 3.7  --   --   AST 31  --   --   ALT 39  --   --   ALKPHOS 81  --   --   BILITOT 0.7  --   --   GFRNONAA 59*  --  >60  ANIONGAP 10  --  9    Hematology Recent Labs  Lab 03/01/23 1934 03/01/23 1952 03/02/23 0107  WBC 10.3  --  10.7*  RBC 3.25*  --  3.36*  HGB 10.5* 9.5* 10.5*  HCT 32.2* 28.0* 31.8*  MCV 99.1  --  94.6  MCH 32.3  --  31.3  MCHC 32.6  --  33.0  RDW 15.9*  --  16.0*  PLT 340  --  339   BNPNo results for input(s): "BNP", "PROBNP" in the last 168 hours.  DDimer No results for input(s): "DDIMER" in the last 168 hours.   Radiology  CARDIAC CATHETERIZATION  Result Date: 03/01/2023   Lat 1st Mrg lesion is 50% stenosed.   Mid LAD lesion is 100% stenosed.  Balloon angioplasty was performed using a BALL SAPPHIRE NC24 3.25X18.   Post intervention, there is a 0%  residual stenosis.   1st Diag lesion is 80% stenosed.  Balloon angioplasty was performed using a BALLN EMERGE MR 2.0X12.   Post intervention, there is a 20% residual stenosis.   There is severe left ventricular systolic dysfunction.   LV end diastolic pressure is moderately elevated.   The left ventricular ejection fraction is less than 25% by visual estimate.   There is no aortic valve stenosis.   In the absence of any other complications or medical issues, we expect the patient to be ready for discharge from an interventional cardiology perspective on 03/04/2023.   Recommend uninterrupted dual antiplatelet therapy with Aspirin 81mg  daily and Prasugrel 10mg  daily for a minimum of 12 months (ACS-Class I recommendation).   Tortuous right subclavian.  Longer guide needed for the left.  EBU 3.75 was sent for. Successful PTCA of the acute stent thrombosis in the mid LAD stent.  Successful PTCA of the ostial diagonal branch. It appears that the patient was unable to afford Brilinta at the time of hospital discharge.  Another prescription has been sent to his home pharmacy.  They did not have the medicine in stock and it was going to take them 30 days to get it according to the patient.  He has been without his Brilinta.  Going forward, he will need to be discharged on either clopidogrel or prasugrel since both options should be less expensive.  We did load him with Brilinta in the Cath Lab today prior to knowing this history.  Tomorrow, would verify cost and if possible, treat with Effient 60 mg x 1.  After that, he will need Effient 10 mg daily.  He will need aggressive diuresis as well.  40 mg of Lasix IV was given after the case in the Cath Lab.  He will need medical therapy for LV dysfunction as well. In addition, he had severe right forearm bruising from the prior catheterization.  There is no hematoma but bruising spreading across the medial forearm.  He will be transferred to the CCU.    Cardiac Studies  LHC  03/01/2023   Lat 1st Mrg lesion is 50% stenosed.   Mid LAD lesion is 100% stenosed.  Balloon angioplasty was performed using a BALL SAPPHIRE NC24 3.25X18.  Post intervention, there is a 0% residual stenosis.   1st Diag lesion is 80% stenosed.  Balloon angioplasty was performed using a BALLN EMERGE MR 2.0X12.   Post intervention, there is a 20% residual stenosis.   There is severe left ventricular systolic dysfunction.   LV end diastolic pressure is moderately elevated.   The left ventricular ejection fraction is less than 25% by visual estimate.   There is no aortic valve stenosis.   In the absence of any other complications or medical issues, we expect the patient to be ready for discharge from an interventional cardiology perspective on 03/04/2023.   Recommend uninterrupted dual antiplatelet therapy with Aspirin 81mg  daily and Prasugrel 10mg  daily for a minimum of 12 months (ACS-Class I recommendation).   Tortuous right subclavian.  Longer guide needed for the left.  EBU 3.75 was sent for.  Patient Profile  Douglas Mason is a 72 y.o. male with diabetes, hypertension, OSA, CAD (recent PCI to mid LAD 02/20/2023) who was admitted on 03/01/2023 for anterior STEMI.  He had been out of Brilinta.  Assessment & Plan   CAD/Anterior STEMI: Pt had a mid LAD stent placed on 02/20/23 and ran out of Brilinta. He was admitted on 03/01/23 with an anterior STEMI secondary to stent thrombosis. This was treated with balloon angioplasty. He is doing well today with no chest pain. He has completed 18 hour infusion of Aggrastat and has been restarted on ASA and Brilinta. Will review cost of Brilinta with pharmacy today and consider changing to Effient or Plavix if the Brilinta is cost prohibitive.  Continue beta blocker and statin  Continue Imdur  Ischemic cardiomyopathy/Acute systolic CHF: He was given IV Lasix yesterday and does not appear to be volume overloaded today. LVEF now 30-35% post MI. His LV function  was normal on the echo two weeks ago.  Will continue beta blocker, Jardiance, and aldactone.  Will change Losartan to [facility] today if cost is not prohibitive.  Will discuss Lifevest before discharge given new LV dysfunction post MI.   DM: Sliding scale insulin ordered.  A1c 6.0.  HTN: BP is well controlled. Continue current therapy.   Transfer to telemetry today. Likely d/c home tomorrow  50 minutes critical care time  Verne Carrow, MD, [facility] 03/03/2023 8:09 AM

## 2023-03-04 ENCOUNTER — Other Ambulatory Visit (HOSPITAL_COMMUNITY): Payer: Self-pay

## 2023-03-04 DIAGNOSIS — I255 Ischemic cardiomyopathy: Secondary | ICD-10-CM | POA: Insufficient documentation

## 2023-03-04 LAB — BASIC METABOLIC PANEL
Anion gap: 11 (ref 5–15)
BUN: 29 mg/dL — ABNORMAL HIGH (ref 8–23)
CO2: 24 mmol/L (ref 22–32)
Calcium: 8.6 mg/dL — ABNORMAL LOW (ref 8.9–10.3)
Chloride: 99 mmol/L (ref 98–111)
Creatinine, Ser: 1.55 mg/dL — ABNORMAL HIGH (ref 0.61–1.24)
GFR, Estimated: 47 mL/min — ABNORMAL LOW (ref >60)
Glucose, Bld: 147 mg/dL — ABNORMAL HIGH (ref 70–99)
Potassium: 3.9 mmol/L (ref 3.5–5.1)
Sodium: 134 mmol/L — ABNORMAL LOW (ref 135–145)

## 2023-03-04 LAB — CBC
HCT: 30.1 % — ABNORMAL LOW (ref 39.0–52.0)
Hemoglobin: 10 g/dL — ABNORMAL LOW (ref 13.0–17.0)
MCH: 31.8 pg (ref 26.0–34.0)
MCHC: 33.2 g/dL (ref 30.0–36.0)
MCV: 95.9 fL (ref 80.0–100.0)
Platelets: 310 10*3/uL (ref 150–400)
RBC: 3.14 MIL/uL — ABNORMAL LOW (ref 4.22–5.81)
RDW: 16.2 % — ABNORMAL HIGH (ref 11.5–15.5)
WBC: 14.7 10*3/uL — ABNORMAL HIGH (ref 4.0–10.5)
nRBC: 0 % (ref 0.0–0.2)

## 2023-03-04 LAB — GLUCOSE, CAPILLARY
Glucose-Capillary: 149 mg/dL — ABNORMAL HIGH (ref 70–99)
Glucose-Capillary: 148 mg/dL — ABNORMAL HIGH (ref 70–99)

## 2023-03-04 MED ORDER — ISOSORBIDE MONONITRATE ER 30 MG PO TB24
30.0000 mg | ORAL_TABLET | Freq: Every day | ORAL | 3 refills | Status: DC
  Filled 2023-03-04 – 2023-05-28 (×4): qty 90, 90d supply, fill #0
  Filled 2023-08-25 – 2023-08-26 (×3): qty 90, 90d supply, fill #1
  Filled 2023-11-24 – 2023-11-25 (×2): qty 90, 90d supply, fill #2

## 2023-03-04 MED ORDER — PRASUGREL HCL 10 MG PO TABS
10.0000 mg | ORAL_TABLET | Freq: Every day | ORAL | 11 refills | Status: DC
  Filled 2023-03-04 – 2023-03-24 (×3): qty 30, 30d supply, fill #0
  Filled 2023-04-21: qty 30, 30d supply, fill #1

## 2023-03-04 MED ORDER — NITROGLYCERIN 0.4 MG SL SUBL
0.4000 mg | SUBLINGUAL_TABLET | SUBLINGUAL | 1 refills | Status: AC | PRN
  Filled 2023-03-04: qty 25, 7d supply, fill #0

## 2023-03-04 MED ORDER — EMPAGLIFLOZIN 25 MG PO TABS
25.0000 mg | ORAL_TABLET | Freq: Every day | ORAL | 11 refills | Status: DC
  Filled 2023-03-04 – 2023-03-24 (×3): qty 30, 30d supply, fill #0
  Filled 2023-04-21: qty 30, 30d supply, fill #1
  Filled 2023-05-28 (×2): qty 30, 30d supply, fill #2
  Filled 2023-07-04 – 2023-07-24 (×2): qty 30, 30d supply, fill #3
  Filled 2023-08-25 – 2023-08-26 (×3): qty 30, 30d supply, fill #4
  Filled 2023-09-30: qty 30, 30d supply, fill #5
  Filled 2023-11-01: qty 30, 30d supply, fill #6
  Filled 2023-12-07: qty 30, 30d supply, fill #7
  Filled 2024-01-04: qty 30, 30d supply, fill #8
  Filled 2024-02-14: qty 30, 30d supply, fill #9

## 2023-03-04 MED ORDER — SPIRONOLACTONE 25 MG PO TABS
12.5000 mg | ORAL_TABLET | Freq: Every day | ORAL | 3 refills | Status: DC
  Filled 2023-03-04 – 2023-04-30 (×4): qty 45, 90d supply, fill #0
  Filled 2023-07-04 – 2023-07-07 (×2): qty 45, 90d supply, fill #1
  Filled 2023-11-24 – 2023-11-25 (×2): qty 45, 90d supply, fill #2

## 2023-03-04 NOTE — Discharge Summary (Signed)
Discharge Summary    Patient ID: Douglas Mason MRN: [id number]; DOB: [birth year]  Admit date: 03/01/2023 Discharge date: 03/04/2023  PCP:  Benita Stabile, MD   [facility] Providers Cardiologist:  Christell Constant, MD      Discharge Diagnoses    Principal Problem:   Acute anterior wall MI [facility]) Active Problems:   Essential hypertension   Diabetes mellitus type II, non insulin dependent [facility])   Ischemic cardiomyopathy    Diagnostic Studies/Procedures    LHC 03/01/23   Lat 1st Mrg lesion is 50% stenosed.   Mid LAD lesion is 100% stenosed.  Balloon angioplasty was performed using a BALL SAPPHIRE NC24 3.25X18.   Post intervention, there is a 0% residual stenosis.   1st Diag lesion is 80% stenosed.  Balloon angioplasty was performed using a BALLN EMERGE MR 2.0X12.   Post intervention, there is a 20% residual stenosis.   There is severe left ventricular systolic dysfunction.   LV end diastolic pressure is moderately elevated.   The left ventricular ejection fraction is less than 25% by visual estimate.   There is no aortic valve stenosis.   In the absence of any other complications or medical issues, we expect the patient to be ready for discharge from an interventional cardiology perspective on 03/04/2023.   Recommend uninterrupted dual antiplatelet therapy with Aspirin 81mg  daily and Prasugrel 10mg  daily for a minimum of 12 months (ACS-Class I recommendation).   Tortuous right subclavian.  Longer guide needed for the left.  EBU 3.75 was sent for.   Successful PTCA of the acute stent thrombosis in the mid LAD stent.  Successful PTCA of the ostial diagonal branch.   It appears that the patient was unable to afford Brilinta at the time of hospital discharge.  Another prescription has been sent to his home pharmacy.  They did not have the medicine in stock and it was going to take them 30 days to get it according to the patient.  He has been without his Brilinta.   Going forward, he will need to be discharged on either clopidogrel or prasugrel since both options should be less expensive.  We did load him with Brilinta in the Cath Lab today prior to knowing this history.  Tomorrow, would verify cost and if possible, treat with Effient 60 mg x 1.  After that, he will need Effient 10 mg daily.  He will need aggressive diuresis as well.  40 mg of Lasix IV was given after the case in the Cath Lab.  He will need medical therapy for LV dysfunction as well.   In addition, he had severe right forearm bruising from the prior catheterization.  There is no hematoma but bruising spreading across the medial forearm.  He will be transferred to the CCU.  Diagnostic Dominance: Right  Intervention    Echocardiogram 03/02/23  1. Left ventricular ejection fraction, by estimation, is 30 to 35%. The  left ventricle has moderately decreased function. The left ventricle  demonstrates regional wall motion abnormalities (see scoring  diagram/findings for description). Left ventricular   diastolic parameters are consistent with Grade I diastolic dysfunction  (impaired relaxation).   2. Right ventricular systolic function is normal. The right ventricular  size is normal. Tricuspid regurgitation signal is inadequate for assessing  PA pressure.   3. The mitral valve is grossly normal. Trivial mitral valve  regurgitation. No evidence of mitral stenosis.   4. The aortic valve is tricuspid. Aortic valve regurgitation is  not  visualized. Aortic valve sclerosis is present, with no evidence of aortic  valve stenosis.   5. There is mild dilatation of the ascending aorta, measuring 43 mm.   6. The inferior vena cava is dilated in size with <50% respiratory  variability, suggesting right atrial pressure of 15 mmHg.   Comparison(s): Changes from prior study are noted. The left ventricular  function is significantly worse.   Conclusion(s)/Recommendation(s): Findings consistent with  ischemic  cardiomyopathy.  _____________   History of Present Illness     Douglas Mason is a 72 y.o. male with a past medical history of HTN, HLD, type 2 DM, OSA, anxiety, and CAD s/p LAD stent 2 weeks ago. After being discharged after LAD stent, he had not been taking brilinta. He presented on 6/29 as an acute STEMI. At the time of cardiology evaluation, patient reported that he started having severe crushing chest pain at 3:30 PM. Radiated to left shoulder. Chest pain was associated with shortness of breath, nausea, vomiting. He called EMS and EKG showed anterior and lateral ST elevations. Code STEMI was activated. In the ED, patient was given 4K heparin, 2 doses of SL nitroglycerin, 2 doses of morphine, and 324 mg ASA. He was taken emergently for left heart catheterization   Hospital Course     Consultants: None   Patient presented on 6/29 as an acute STEMI and was taken emergently to the cath lab with Dr. Eldridge Dace. Cardiac catheterization showed acute stent thrombosis in the mid LAD stent that was treated with PTCA/balloon angioplasty. Also found to have 80% stenosis in the 1st diagonal that was treated with PTCA/balloon angioplasty. Completed an 18 hour infusion of Aggrastat after cath. He did have some chest pain on 6/30, and he was started on Imdur 30 mg daily. He was loaded with Brilinta in the cath lab and was started on Brilinta 90 mg BID. However, there were concerns about the cost of brilinta, and patient was transitioned to effient on 7/2.  Echocardiogram this admission on 6/30 showed EF 30-35% with regional wall motoin abnormalities, grade I DD, normal RV systolic function, mild dilation of the ascending aorta measuring 43 mm. Patient remained on home losartan 25 mg daily and bisoprolol 5 mg daily, and was started on imdur 30 mg daily, spironolactone 12.5 mg daily and jardiance. As patient had new LV dysfunction post MI (EF 30-35% with wall motion abnormalities), there was concern  that patient would be at risk for VT and VF. He was fitted for a lifevest prior to discharge   Anterior STEMI  CAD  - Patient had a LAD stent placed on 02/20/23 and he was unable to pick up brilinta. He was admitted on 6/29 with an anterior STEMI secondary to stent thrombosis. Was treated with balloon angioplasty. Also underwent balloon angioplasty to the first diagonal that was 80% stenosed  - Patient initially loaded with brilinta, but he was transitioned to effient due to cost. Received one dose of Effient 60 mg on 7/3, and is being discharged on effient 10 mg daily  - Continue ASA 81 mg daily - Continue lipitor 80 mg daily  - Continue bisoprolol 5 mg daily  - Continue imdur 30 mg daily  - At discharge, sent medications to [facility] pharmacy to ensure that patient had medications in hand prior to leaving the hospital  - Right radial cath site was stable prior to DC. Discussed radial site care and provided written instructions in DC Paperwork  - He was seen  by Cardiac rehab and tolerated ambulation well prior to DC. Referred to [facility] - Patient has a follow up appointment in [place] on 7/5. Needs BMP at that appointment   Ischemic Cardiomyopathy  - Echocardiogram this admission showed EF 30-35% with regional wall motion abnormalities. He did receive one dose of IV lasix on 6/30  - Patient euvolemic on exam prior to DC  - Continue bisoprolol 5 mg daily, jardiance, losartan 25 mg daily, imdur 30 mg daily, spironolactone 12.5 mg daily  - Patient was not transitioned to entresto due to low BP and rising creatinine (up to 1.55 prior to DC). Can consider transitioning at OP follow up  - Patient was educated on/fitted with a lifevest on 7/1. He understands that he should wear the lifevest at all times - Patient needs BMP at follow up appointment to assess renal function and electrolytes on GDMT    DM  - Patient was on SSI this admission  - Resume home glipizide, metformin, Toujeo solostar at [employer] this admission   HTN  - BP well controlled on current medications   Did the patient have an acute coronary syndrome (MI, NSTEMI, STEMI, etc) this admission?:  Yes                               AHA/ACC Clinical Performance & Quality Measures: Aspirin prescribed? - Yes ADP Receptor Inhibitor (Plavix/Clopidogrel, Brilinta/Ticagrelor or Effient/Prasugrel) prescribed (includes medically managed patients)? - Yes Beta Blocker prescribed? - Yes High Intensity Statin (Lipitor 40-80mg  or Crestor 20-40mg ) prescribed? - Yes EF assessed during THIS hospitalization? - Yes For EF <40%, was ACEI/ARB prescribed? - Yes For EF <40%, Aldosterone Antagonist (Spironolactone or Eplerenone) prescribed? - Yes Cardiac Rehab Phase II ordered (including medically managed patients)? - Yes   _____________  Discharge Vitals Blood pressure 111/75, pulse 90, temperature 97.9 F (36.6 C), temperature source Oral, resp. rate 15, height 6' (1.829 m), weight 104.7 kg, SpO2 95 %.  Filed Weights   03/01/23 2100 03/02/23 0500 03/03/23 1010  Weight: 108.2 kg 108.1 kg 104.7 kg    Labs & Radiologic Studies    CBC Recent Labs    03/01/23 1934 03/01/23 1952 03/03/23 0248 03/04/23 0223  WBC 10.3   < > 15.4* 14.7*  NEUTROABS 6.7  --   --   --   HGB 10.5*   < > 10.1* 10.0*  HCT 32.2*   < > 30.7* 30.1*  MCV 99.1   < > 95.6 95.9  PLT 340   < > 308 310   < > = values in this interval not displayed.   Basic Metabolic Panel Recent Labs    78/29/56 0248 03/04/23 0223  NA 136 134*  K 4.5 3.9  CL 98 99  CO2 28 24  GLUCOSE 144* 147*  BUN 20 29*  CREATININE 1.44* 1.55*  CALCIUM 8.6* 8.6*   Liver Function Tests Recent Labs    03/01/23 1934  AST 31  ALT 39  ALKPHOS 81  BILITOT 0.7  PROT 6.5  ALBUMIN 3.7   No results for input(s): "LIPASE", "AMYLASE" in the last 72 hours. High Sensitivity Troponin:   Recent Labs  Lab 02/19/23 0533 02/19/23 0732 02/19/23 0924 03/01/23 1934 03/01/23 2124   TROPONINIHS 3 3 3  37* >24,000*    BNP Invalid input(s): "POCBNP" D-Dimer No results for input(s): "DDIMER" in the last 72 hours. Hemoglobin A1C No results for input(s): "HGBA1C"  in the last 72 hours. Fasting Lipid Panel No results for input(s): "CHOL", "HDL", "LDLCALC", "TRIG", "CHOLHDL", "LDLDIRECT" in the last 72 hours. Thyroid Function Tests No results for input(s): "TSH", "T4TOTAL", "T3FREE", "THYROIDAB" in the last 72 hours.  Invalid input(s): "FREET3" _____________  ECHOCARDIOGRAM COMPLETE  Result Date: 03/02/2023    ECHOCARDIOGRAM REPORT   Patient Name:   Douglas Mason Date of Exam: 03/02/2023 Medical Rec #:  [id number]         Height:       72.0 in Accession #:    [id number]        Weight:       238.3 lb Date of Birth:  [birth year]        BSA:          2.295 m Patient Age:    71 years          BP:           147/85 mmHg Patient Gender: M                 HR:           85 bpm. Exam Location:  Inpatient Procedure: 2D Echo, Color Doppler, Cardiac Doppler and Intracardiac            Opacification Agent Indications:    Acute MI i21.9  History:        Patient has prior history of Echocardiogram examinations, most                 recent 02/19/2023. Arrythmias:Atrial Flutter; Risk                 Factors:Hypertension, Diabetes, Dyslipidemia and Sleep Apnea.  Sonographer:    Irving Burton Senior RDCS Referring Phys: [id number] Ronnald Ramp O'NEAL  Sonographer Comments: Technically difficult study, patient scanned sitting upright due to dyspnea. IMPRESSIONS  1. Left ventricular ejection fraction, by estimation, is 30 to 35%. The left ventricle has moderately decreased function. The left ventricle demonstrates regional wall motion abnormalities (see scoring diagram/findings for description). Left ventricular  diastolic parameters are consistent with Grade I diastolic dysfunction (impaired relaxation).  2. Right ventricular systolic function is normal. The right ventricular size is normal. Tricuspid  regurgitation signal is inadequate for assessing PA pressure.  3. The mitral valve is grossly normal. Trivial mitral valve regurgitation. No evidence of mitral stenosis.  4. The aortic valve is tricuspid. Aortic valve regurgitation is not visualized. Aortic valve sclerosis is present, with no evidence of aortic valve stenosis.  5. There is mild dilatation of the ascending aorta, measuring 43 mm.  6. The inferior vena cava is dilated in size with <50% respiratory variability, suggesting right atrial pressure of 15 mmHg. Comparison(s): Changes from prior study are noted. The left ventricular function is significantly worse. Conclusion(s)/Recommendation(s): Findings consistent with ischemic cardiomyopathy. FINDINGS  Left Ventricle: Left ventricular ejection fraction, by estimation, is 30 to 35%. The left ventricle has moderately decreased function. The left ventricle demonstrates regional wall motion abnormalities. Definity contrast agent was given IV to delineate the left ventricular endocardial borders. The left ventricular internal cavity size was normal in size. There is no left ventricular hypertrophy of the septal segment. Left ventricular diastolic parameters are consistent with Grade I diastolic dysfunction (impaired relaxation).  LV Wall Scoring: The mid and distal anterior septum, entire apex, and mid inferoseptal segment are akinetic. Right Ventricle: The right ventricular size is normal. No increase in right ventricular wall thickness. Right ventricular systolic function is normal. Tricuspid  regurgitation signal is inadequate for assessing PA pressure. Left Atrium: Left atrial size was normal in size. Right Atrium: Right atrial size was normal in size. Pericardium: Trivial pericardial effusion is present. The pericardial effusion is surrounding the apex. Presence of epicardial fat layer. Mitral Valve: The mitral valve is grossly normal. Trivial mitral valve regurgitation. No evidence of mitral valve  stenosis. Tricuspid Valve: The tricuspid valve is grossly normal. Tricuspid valve regurgitation is trivial. No evidence of tricuspid stenosis. Aortic Valve: The aortic valve is tricuspid. Aortic valve regurgitation is not visualized. Aortic valve sclerosis is present, with no evidence of aortic valve stenosis. Pulmonic Valve: The pulmonic valve was grossly normal. Pulmonic valve regurgitation is trivial. No evidence of pulmonic stenosis. Aorta: The aortic root is normal in size and structure. There is mild dilatation of the ascending aorta, measuring 43 mm. Venous: The inferior vena cava is dilated in size with less than 50% respiratory variability, suggesting right atrial pressure of 15 mmHg. IAS/Shunts: The atrial septum is grossly normal.  LEFT VENTRICLE PLAX 2D LVIDd:         4.80 cm   Diastology LVIDs:         4.10 cm   LV e' medial:    6.20 cm/s LV PW:         0.90 cm   LV E/e' medial:  7.7 LV IVS:        1.10 cm   LV e' lateral:   6.42 cm/s LVOT diam:     2.20 cm   LV E/e' lateral: 7.4 LV SV:         59 LV SV Index:   26 LVOT Area:     3.80 cm  RIGHT VENTRICLE RV S prime:     11.90 cm/s TAPSE (M-mode): 1.9 cm LEFT ATRIUM             Index        RIGHT ATRIUM           Index LA diam:        3.20 cm 1.39 cm/m   RA Area:     12.00 cm LA Vol (A2C):   67.8 ml 29.54 ml/m  RA Volume:   25.70 ml  11.20 ml/m LA Vol (A4C):   41.5 ml 18.08 ml/m LA Biplane Vol: 53.2 ml 23.18 ml/m  AORTIC VALVE LVOT Vmax:   91.80 cm/s LVOT Vmean:  66.000 cm/s LVOT VTI:    0.155 m  AORTA Ao Root diam: 3.70 cm Ao Asc diam:  4.30 cm MITRAL VALVE MV Area (PHT): 2.01 cm    SHUNTS MV Decel Time: 377 msec    Systemic VTI:  0.16 m MV E velocity: 47.50 cm/s  Systemic Diam: 2.20 cm MV A velocity: 67.40 cm/s MV E/A ratio:  0.70 Lennie Odor MD Electronically signed by Lennie Odor MD Signature Date/Time: 03/02/2023/11:24:41 AM    Final    DG CHEST PORT 1 VIEW  Result Date: 03/02/2023 CLINICAL DATA:  Shortness of breath. EXAM: PORTABLE  CHEST 1 VIEW COMPARISON:  Chest radiograph 02/19/2023 CT angio chest 09/11/2021 FINDINGS: Streaky airspace opacities in the left mid and lower lung, new compared to 02/19/2023. Right lung is clear. No pleural effusion or pneumothorax. IMPRESSION: Streaky airspace opacities in the left mid and lower lung, new compared to 02/19/2023, may represent atelectasis or pneumonia in the appropriate clinical context. Electronically Signed   By: Sherron Ales M.D.   On: 03/02/2023 11:01   CARDIAC CATHETERIZATION  Result Date: 03/01/2023  Lat 1st Mrg lesion is 50% stenosed.   Mid LAD lesion is 100% stenosed.  Balloon angioplasty was performed using a BALL SAPPHIRE NC24 3.25X18.   Post intervention, there is a 0% residual stenosis.   1st Diag lesion is 80% stenosed.  Balloon angioplasty was performed using a BALLN EMERGE MR 2.0X12.   Post intervention, there is a 20% residual stenosis.   There is severe left ventricular systolic dysfunction.   LV end diastolic pressure is moderately elevated.   The left ventricular ejection fraction is less than 25% by visual estimate.   There is no aortic valve stenosis.   In the absence of any other complications or medical issues, we expect the patient to be ready for discharge from an interventional cardiology perspective on 03/04/2023.   Recommend uninterrupted dual antiplatelet therapy with Aspirin 81mg  daily and Prasugrel 10mg  daily for a minimum of 12 months (ACS-Class I recommendation).   Tortuous right subclavian.  Longer guide needed for the left.  EBU 3.75 was sent for. Successful PTCA of the acute stent thrombosis in the mid LAD stent.  Successful PTCA of the ostial diagonal branch. It appears that the patient was unable to afford Brilinta at the time of hospital discharge.  Another prescription has been sent to his home pharmacy.  They did not have the medicine in stock and it was going to take them 30 days to get it according to the patient.  He has been without his Brilinta.   Going forward, he will need to be discharged on either clopidogrel or prasugrel since both options should be less expensive.  We did load him with Brilinta in the Cath Lab today prior to knowing this history.  Tomorrow, would verify cost and if possible, treat with Effient 60 mg x 1.  After that, he will need Effient 10 mg daily.  He will need aggressive diuresis as well.  40 mg of Lasix IV was given after the case in the Cath Lab.  He will need medical therapy for LV dysfunction as well. In addition, he had severe right forearm bruising from the prior catheterization.  There is no hematoma but bruising spreading across the medial forearm.  He will be transferred to the CCU.   CARDIAC CATHETERIZATION  Result Date: 02/20/2023   Lat 1st Mrg lesion is 50% stenosed.   Mid LAD lesion is 85% stenosed.   A stent was successfully placed.   Post intervention, there is a 0% residual stenosis. 1.  High-grade mid LAD lesion treated with OCT guided and optimized PCI with 1 drug-eluting stent. 2.  LVEDP of 19 mmHg. Recommendation: Overnight observation, dual antiplatelet therapy at least 6 months and preferably 1 year with aggressive cardiovascular risk factor modification.   ECHOCARDIOGRAM COMPLETE  Result Date: 02/19/2023    ECHOCARDIOGRAM REPORT   Patient Name:   Douglas Mason Date of Exam: 02/19/2023 Medical Rec #:  [id number]         Height:       72.0 in Accession #:    [id number]        Weight:       240.0 lb Date of Birth:  1950-09-19        BSA:          2.302 m Patient Age:    71 years          BP:           138/79 mmHg Patient Gender: M  HR:           65 bpm. [facility] Procedure: 2D Echo, Cardiac Doppler and Color Doppler Indications:    Chest Pain R07.9  History:        Patient has prior history of Echocardiogram examinations, most                 recent 11/23/2021. Arrythmias:Atrial Flutter; Risk                 Factors:Hypertension, Diabetes and Dyslipidemia.  Sonographer:     Celesta Gentile RCS Referring Phys: [phone] SEYED A SHAHMEHDI IMPRESSIONS  1. Left ventricular ejection fraction, by estimation, is 55 to 60%. The left ventricle has normal function. The left ventricle has no regional wall motion abnormalities. There is mild concentric left ventricular hypertrophy. Left ventricular diastolic parameters were normal.  2. Right ventricular systolic function is normal. The right ventricular size is normal. There is mildly elevated pulmonary artery systolic pressure. The estimated right ventricular systolic pressure is 39.5 mmHg.  3. The mitral valve is grossly normal. Trivial mitral valve regurgitation.  4. The aortic valve is tricuspid. Aortic valve regurgitation is not visualized.  5. The inferior vena cava is normal in size with greater than 50% respiratory variability, suggesting right atrial pressure of 3 mmHg. Comparison(s): Prior images reviewed side by side. LVEF remains normal range at 55-60%. FINDINGS  Left Ventricle: Left ventricular ejection fraction, by estimation, is 55 to 60%. The left ventricle has normal function. The left ventricle has no regional wall motion abnormalities. The left ventricular internal cavity size was normal in size. There is  mild concentric left ventricular hypertrophy. Left ventricular diastolic parameters were normal. Right Ventricle: The right ventricular size is normal. No increase in right ventricular wall thickness. Right ventricular systolic function is normal. There is mildly elevated pulmonary artery systolic pressure. The tricuspid regurgitant velocity is 3.02  m/s, and with an assumed right atrial pressure of 3 mmHg, the estimated right ventricular systolic pressure is 39.5 mmHg. Left Atrium: Left atrial size was normal in size. Right Atrium: Right atrial size was normal in size. Pericardium: There is no evidence of pericardial effusion. Presence of epicardial fat layer. Mitral Valve: The mitral valve is grossly normal. Trivial mitral valve  regurgitation. Tricuspid Valve: The tricuspid valve is grossly normal. Tricuspid valve regurgitation is mild. Aortic Valve: The aortic valve is tricuspid. There is mild aortic valve annular calcification. Aortic valve regurgitation is not visualized. Pulmonic Valve: The pulmonic valve was grossly normal. Pulmonic valve regurgitation is trivial. Aorta: The aortic root is normal in size and structure. Venous: The inferior vena cava is normal in size with greater than 50% respiratory variability, suggesting right atrial pressure of 3 mmHg. IAS/Shunts: No atrial level shunt detected by color flow Doppler.  LEFT VENTRICLE PLAX 2D LVIDd:         5.20 cm   Diastology LVIDs:         2.80 cm   LV e' medial:    9.03 cm/s LV PW:         1.30 cm   LV E/e' medial:  11.8 LV IVS:        1.20 cm   LV e' lateral:   12.00 cm/s LVOT diam:     2.20 cm   LV E/e' lateral: 8.9 LV SV:         93 LV SV Index:   40 LVOT Area:     3.80 cm  RIGHT  VENTRICLE RV S prime:     13.90 cm/s TAPSE (M-mode): 2.4 cm LEFT ATRIUM             Index        RIGHT ATRIUM           Index LA diam:        4.20 cm 1.82 cm/m   RA Area:     18.30 cm LA Vol (A2C):   49.1 ml 21.33 ml/m  RA Volume:   50.70 ml  22.02 ml/m LA Vol (A4C):   55.4 ml 24.06 ml/m LA Biplane Vol: 58.1 ml 25.24 ml/m  AORTIC VALVE LVOT Vmax:   121.00 cm/s LVOT Vmean:  78.100 cm/s LVOT VTI:    0.244 m  AORTA Ao Root diam: 3.80 cm MITRAL VALVE                TRICUSPID VALVE MV Area (PHT): 2.80 cm     TR Peak grad:   36.5 mmHg MV Decel Time: 271 msec     TR Vmax:        302.00 cm/s MR Peak grad: 135.0 mmHg MR Mean grad: 86.0 mmHg     SHUNTS MR Vmax:      581.00 cm/s   Systemic VTI:  0.24 m MR Vmean:     426.0 cm/s    Systemic Diam: 2.20 cm MV E velocity: 106.50 cm/s MV A velocity: 83.55 cm/s MV E/A ratio:  1.27 Nona Dell MD Electronically signed by Nona Dell MD Signature Date/Time: 02/19/2023/10:51:52 AM    Final    DG Chest 2 View  Result Date: 02/19/2023 CLINICAL DATA:   Chest pain on the left EXAM: CHEST - 2 VIEW COMPARISON:  09/10/2021 FINDINGS: Cardiac shadow is mildly prominent but stable. Elevation of the right hemidiaphragm is noted. No focal infiltrate or effusion is seen. No bony abnormality is noted. IMPRESSION: No active cardiopulmonary disease. Electronically Signed   By: Alcide Clever M.D.   On: 02/19/2023 04:00   Disposition   Pt is being discharged home today in good condition.  Follow-up Plans & Appointments     Discharge Instructions     Amb Referral to Cardiac Rehabilitation   Complete by: As directed    Diagnosis:  STEMI PTCA     After initial evaluation and assessments completed: Virtual Based Care may be provided alone or in conjunction with Phase 2 Cardiac Rehab based on patient barriers.: Yes   Intensive Cardiac Rehabilitation (ICR) [facility] location only OR Traditional Cardiac Rehabilitation (TCR) *If criteria for ICR are not met will enroll in TCR [facility] only): Yes   Diet - low sodium heart healthy   Complete by: As directed    Discharge instructions   Complete by: As directed    Radial Site Care Refer to this sheet in the next few weeks. These instructions provide you with information on caring for yourself after your procedure. Your caregiver may also give you more specific instructions. Your treatment has been planned according to current medical practices, but problems sometimes occur. Call your caregiver if you have any problems or questions after your procedure. HOME CARE INSTRUCTIONS You may shower the day after the procedure. Remove the bandage (dressing) and gently wash the site with plain soap and water. Gently pat the site dry.  Do not apply powder or lotion to the site.  Do not submerge the affected site in water for 3 to 5 days.  Inspect the site at least twice daily.  Do  not flex or bend the affected arm for 24 hours.  No lifting over 5 pounds (2.3 kg) for 5 days after your procedure.  Do not drive home if you are  discharged the same day of the procedure. Have someone else drive you.  You may drive 24 hours after the procedure unless otherwise instructed by your caregiver.  What to expect: Any bruising will usually fade within 1 to 2 weeks.  Blood that collects in the tissue (hematoma) may be painful to the touch. It should usually decrease in size and tenderness within 1 to 2 weeks.  SEEK IMMEDIATE MEDICAL CARE IF: You have unusual pain at the radial site.  You have redness, warmth, swelling, or pain at the radial site.  You have drainage (other than a small amount of blood on the dressing).  You have chills.  You have a fever or persistent symptoms for more than 72 hours.  You have a fever and your symptoms suddenly get worse.  Your arm becomes pale, cool, tingly, or numb.  You have heavy bleeding from the site. Hold pressure on the site.   PLEASE DO NOT MISS ANY DOSES OF YOUR EFFIENT!!!!! Also keep a log of you blood pressures and bring back to your follow up appt. Please call the office with any questions.   Patients taking blood thinners should generally stay away from medicines like ibuprofen, Advil, Motrin, naproxen, and Aleve due to risk of stomach bleeding. You may take Tylenol as directed or talk to your primary doctor about alternatives.  PLEASE ENSURE THAT YOU DO NOT RUN OUT OF YOUR EFFIENT. This medication is very important to remain on for at least one year. IF you have issues obtaining this medication due to cost please CALL the office 3-5 business days prior to running out in order to prevent missing doses of this medication.   Increase activity slowly   Complete by: As directed         Discharge Medications   Allergies as of 03/04/2023       Reactions   Lisinopril Cough        Medication List     STOP taking these medications    potassium chloride SA 20 MEQ tablet Commonly known as: KLOR-CON M   ticagrelor 90 MG Tabs tablet Commonly known as: BRILINTA        TAKE these medications    albuterol 108 (90 Base) MCG/ACT inhaler Commonly known as: VENTOLIN HFA Inhale 2 puffs into the lungs every 4 (four) hours as needed for wheezing or shortness of breath.   albuterol (2.5 MG/3ML) 0.083% nebulizer solution Commonly known as: PROVENTIL Take 3 mLs (2.5 mg total) by nebulization every 6 (six) hours as needed for wheezing or shortness of breath.   ALPRAZolam 0.5 MG tablet Commonly known as: XANAX Take 0.5 mg by mouth at bedtime as needed for anxiety.   aspirin EC 81 MG tablet Take 1 tablet (81 mg total) by mouth daily.   atorvastatin 80 MG tablet Commonly known as: LIPITOR Take 1 tablet (80 mg total) by mouth daily.   azaTHIOprine 50 MG tablet Commonly known as: IMURAN Take 50 mg by mouth daily.   bisoprolol 5 MG tablet Commonly known as: ZEBETA Take 1 tablet (5 mg total) by mouth daily.   empagliflozin 25 MG Tabs tablet Commonly known as: Jardiance Take 1 tablet (25 mg total) by mouth daily. Start taking on: March 05, 2023   glipiZIDE 10 MG 24 hr tablet Commonly known  as: GLUCOTROL XL Take 1 tablet by mouth 2 (two) times a day.   HYDROcodone-acetaminophen 5-325 MG tablet Commonly known as: NORCO/VICODIN Take 1 tablet by mouth at bedtime as needed for pain.   isosorbide mononitrate 30 MG 24 hr tablet Commonly known as: IMDUR Take 1 tablet (30 mg total) by mouth daily. Start taking on: March 05, 2023   losartan 25 MG tablet Commonly known as: COZAAR Take 1 tablet (25 mg total) by mouth daily.   Magnesium 400 MG Tabs Take 1 tablet by mouth daily.   metFORMIN 500 MG 24 hr tablet Commonly known as: GLUCOPHAGE-XR Take 2 tablets (1,000 mg total) by mouth 2 (two) times a day.   nitroGLYCERIN 0.4 MG SL tablet Commonly known as: NITROSTAT Place 1 tablet (0.4 mg total) under the tongue every 5 (five) minutes as needed for chest pain.   prasugrel 10 MG Tabs tablet Commonly known as: EFFIENT Take 1 tablet (10 mg total) by mouth  daily. Start taking on: March 05, 2023   spironolactone 25 MG tablet Commonly known as: ALDACTONE Take 0.5 tablets (12.5 mg total) by mouth daily. Start taking on: March 05, 2023   Toujeo SoloStar 300 UNIT/ML Solostar Pen Generic drug: insulin glargine (1 Unit Dial) Inject 12 Units into the skin daily.               Durable Medical Equipment  (From admission, onward)           Start     Ordered   03/03/23 1010  For home use only DME Vest life vest  Once       Comments: Anterior STEMI, EF 30-35%. Need lifevest for 3 month to prevent VT/VF   03/03/23 1009               Outstanding Labs/Studies    Duration of Discharge Encounter   Greater than 30 minutes including physician time.  Signed, Jonita Albee, PA-C 03/04/2023, 12:11 PM   Patient seen, examined. Available data reviewed. Agree with findings, assessment, and plan as outlined by Marijean Niemann, PA-C.  The patient is independently seen and examined.  He is alert, oriented, no distress.  HEENT is normal, JVP is normal, lungs are clear bilaterally, heart is regular rate and rhythm abdomen abdomen is soft and nontender, extremities have no edema.  The patient's LifeVest is here and he has received instructions.  His medications were reviewed as outlined above.  The importance of medication adherence is emphasized with him and he understands.  Further medicine titration will be done as an outpatient.  The patient is on a good early GDMT regimen.  Regimen includes aspirin, atorvastatin, bisoprolol, Jardiance, losartan, spironolactone, and prasugrel.  Plans to transition losartan to [facility] as an outpatient if his blood pressure will allow.  Medically stable for discharge.  Tonny Bollman, M.D. 03/04/2023 12:11 PM

## 2023-03-04 NOTE — Progress Notes (Signed)
Heart Failure Stewardship Pharmacist Progress Note   PCP: Benita Stabile, MD PCP-Cardiologist: Christell Constant, MD    HPI:  72 yo M with PMH of HTN, T2DM, HLD, GERD, and CAD.   Recently admitted from 6/19-6/21 with chest pain. Taken to cath lab and found to have 85% stenosis at mid LAD s/p DES.   Presented to the ED on 6/29 with chest pain, shortness of breath, and diaphoresis. EKG with ST elevations. Code STEMI called.   Reports he was not taking Brilinta since discharge. It was filled at [facility] pharmacy but copay was >$500 so patient refused. Prescription was then sent to [facility] and copay was $203. Rx was never filled.   Taken to cath and found to have acute stent thrombosis and anterior STEMI. S/p PCI to prox to mid LAD. EF estimated <25%. Formal ECHO done 6/30 and LVEF 30-35% (was 60-65% 2 weeks ago), regional wall motion abnormalities, G1DD, RV normal, trivial MR.   Current HF Medications: Beta Blocker: bisoprolol 5 mg daily ACE/ARB/ARNI: losartan 25 mg daily MRA: spironolactone 12.5 mg daily SGLT2i: Jardiance 25 mg daily  Prior to admission HF Medications: Beta blocker: bisoprolol 5 mg daily ACE/ARB/ARNI: losartan 25 mg daily  Pertinent Lab Values: Serum creatinine 1.55, BUN 29, Potassium 3.9, Sodium 134, BNP 142,  A1c 6.0   Vital Signs: Weight: 230 lbs (admission weight: 238 lbs) Blood pressure: 110/70s  Heart rate: 80-90s  I/O: net -1.2L  Medication Assistance / Insurance Benefits Check: Does the patient have prescription insurance?  Yes Type of insurance plan: Medicare  Outpatient Pharmacy:  Prior to admission outpatient pharmacy: [facility] Is the patient willing to use [facility] TOC pharmacy at discharge? Yes Is the patient willing to transition their outpatient pharmacy to utilize a [facility] outpatient pharmacy?   Yes - transitioned to [facility] mail order    Assessment: 1. Acute systolic CHF (LVEF 30-35%), due to ICM. NYHA class II symptoms. -  Not volume overloaded on exam. No indications for diuretics.  - Continue bisoprolol 5 mg daily - Continue losartan 25 mg daily. Consider transitioning to [facility] as outpatient.  - Continue spironolactone 12.5 mg daily. If unable to transition to [facility] at follow up, consider increasing to 25 mg daily.  - Continue Jardiance 25 mg daily  Plan: 1) Medication changes recommended at this time: - Continue current regimen; transition to Entresto vs increase spironolactone at follow up  2) Patient assistance: - Has $56 deductible remaining on insurance  - Jardiance copay $103 initially; $47 once deductible is met - Entresto copay $103 initially; $47 once deductible is met - Brilinta copay $200 initially; $143 once deductible is met - Prasugrel copay $6.94 - Clopidogrel copay $2.12 - Given multiple issues with access from [facility] pharmacy, discussed with patient, wife, and daughter and decision was made to get discharge medications from [facility] pharmacy at discharge and then to have medications mailed to his house moving forward. Instructed for them to call the number listed on the medication bottle about a week before he is out of medications and then request to have refill be mailed to his home.   3)  Education  - Patient has been educated on current HF medications and potential additions to HF medication regimen - Patient verbalizes understanding that over the next few months, these medication doses may change and more medications may be added to optimize HF regimen - Patient has been educated on basic disease state pathophysiology and goals of therapy  Kerby Nora, PharmD, BCPS Heart Failure Stewardship Pharmacist Phone [phone]

## 2023-03-04 NOTE — Care Management (Signed)
03-04-23 1332 Case Manager received call to see if patient could utilize [facility] for medications. Unit & HF Pharmacist assisted with setting up the mail order pharmacy moving forward. No further needs identified.

## 2023-03-04 NOTE — Progress Notes (Signed)
CARDIAC REHAB PHASE I   PRE:  Rate/Rhythm: 95 SR   BP:  Sitting: 117/78      SaO2: 94 RA  MODE:  Ambulation: 400 ft   POST:  Rate/Rhythm: 98 RA   BP:  Sitting: 117/70      SaO2: 96 RA   Pt ambulated independently in hall. Tolerated well with no CP, SOB or dizziness. Returned to bed with call bell and bedside table in reach. Pt is hopeful for discharge home today. Referral for CRP2 sent to [facility].  [id number]  Woodroe Chen, RN BSN 03/04/2023 9:06 AM

## 2023-03-04 NOTE — Consult Note (Signed)
   [facility] Inpatient Consult   03/04/2023  Douglas Mason [birth year] [id number]  [facility] [[facility]]  Accountable Care Organization [ACO] Patient: Medicare ACO REACH  Primary Care Provider:  Benita Stabile, MD   Patient screened for less than 30 days readmission hospitalization with noted high risk score for unplanned readmission risk and to assess for potential [facility]  [[facility]] Care Management service needs for post hospital transition for care coordination.  Review of patient's electronic medical record reveals patient is currently for home.   Met with patient at the bedside, patient to go home with Life Vest and had medication issues in prior admission with pharmacy not having the medication {Brilinta].  Patient states he would like follow up to ensure, "that I am doing everything right." Patient was given an appointment reminder card with a 24 hour nurse advise line number on it.  Plan:    Referral request for community care coordination: yes, will make referral for closer follow up in community  Of note, [facility] Care Management/Population Health does not replace or interfere with any arrangements made by the Inpatient Transition of Care team.  For questions contact:   Charlesetta Shanks, RN BSN CCM [facility]  [phone] business mobile phone Toll free office [phone]  *Concierge Line  [phone] Fax number: [phone] [email or web address]

## 2023-03-04 NOTE — Care Management Important Message (Signed)
Important Message  Patient Details  Name: Douglas Mason MRN: [id number] Date of Birth: February 01, 1951   Medicare Important Message Given:  Yes     Renie Ora 03/04/2023, 9:04 AM

## 2023-03-05 ENCOUNTER — Telehealth: Payer: Self-pay | Admitting: *Deleted

## 2023-03-05 NOTE — Progress Notes (Signed)
  Care Coordination   Note   03/05/2023 Name: NYZIER REMMICK MRN: [id number] DOB: 1951-03-24  JOSHA DENOBLE is a 72 y.o. year old male who sees Margo Aye, Kathleene Hazel, MD for primary care. I reached out to [name] by phone today to offer care coordination services.  Mr. Manivong was given information about Care Coordination services today including:   The Care Coordination services include support from the care team which includes your Nurse Coordinator, Clinical Social Worker, or Pharmacist.  The Care Coordination team is here to help remove barriers to the health concerns and goals most important to you. Care Coordination services are voluntary, and the patient may decline or stop services at any time by request to their care team member.   Care Coordination Consent Status: Patient agreed to services and verbal consent obtained.   Follow up plan:  Telephone appointment with care coordination team member scheduled for:  03/12/2023  Encounter Outcome:  Pt. Scheduled from referral   Burman Nieves, [facility] Care Coordination Care Guide Direct Dial: [phone]

## 2023-03-07 ENCOUNTER — Encounter: Payer: Self-pay | Admitting: Nurse Practitioner

## 2023-03-07 ENCOUNTER — Ambulatory Visit: Payer: Medicare Other | Attending: Nurse Practitioner | Admitting: Nurse Practitioner

## 2023-03-07 VITALS — BP 118/64 | HR 80 | Ht 72.0 in | Wt 236.4 lb

## 2023-03-07 DIAGNOSIS — I10 Essential (primary) hypertension: Secondary | ICD-10-CM

## 2023-03-07 DIAGNOSIS — R609 Edema, unspecified: Secondary | ICD-10-CM

## 2023-03-07 DIAGNOSIS — T148XXA Other injury of unspecified body region, initial encounter: Secondary | ICD-10-CM | POA: Diagnosis not present

## 2023-03-07 DIAGNOSIS — I255 Ischemic cardiomyopathy: Secondary | ICD-10-CM

## 2023-03-07 DIAGNOSIS — I5020 Unspecified systolic (congestive) heart failure: Secondary | ICD-10-CM | POA: Diagnosis not present

## 2023-03-07 DIAGNOSIS — I2510 Atherosclerotic heart disease of native coronary artery without angina pectoris: Secondary | ICD-10-CM

## 2023-03-07 DIAGNOSIS — I252 Old myocardial infarction: Secondary | ICD-10-CM | POA: Diagnosis not present

## 2023-03-07 DIAGNOSIS — Z79899 Other long term (current) drug therapy: Secondary | ICD-10-CM | POA: Diagnosis not present

## 2023-03-07 NOTE — Progress Notes (Unsigned)
Cardiology Office Note:  .   Date: 03/07/2023 ID:  Douglas Mason, DOB [birth year], MRN [id number] PCP: Benita Stabile, MD  [facility] Providers Cardiologist:  Christell Constant, MD    History of Present Illness: .   Douglas Mason is a 72 y.o. male with a PMH of CAD, s/p anterior wall MI, HTN, T2DM, OSA, anxiety, and ischemic cardiomyopathy, who presents today for hospital follow-up.   Received DES to LAD mid 02/2023. Was unable to pick up Brilinta. Re-admitted on 6/29 for acute STEMI d/t stent thrombosis. Treated with balloon angioplasty, also underwent balloon angioplasty to first diagonal that was found to be 80% stenosed. Echo revealed EF 30-35%. Life Vest arranged prior to discharge.   Today he presents for outpatient follow-up. He states he is doing well. Currently wearing Life Vest. Denies any chest pain, shortness of breath, palpitations, syncope, presyncope, dizziness, orthopnea, PND, swelling or significant weight changes, acute bleeding, or claudication. Compliant with his medications. Does endorse healing bruising along right forearm and small "knot" that is healing along cath site.   Studies Reviewed: .    Echo 03/02/2023:  1. Left ventricular ejection fraction, by estimation, is 30 to 35%. The  left ventricle has moderately decreased function. The left ventricle  demonstrates regional wall motion abnormalities (see scoring  diagram/findings for description). Left ventricular   diastolic parameters are consistent with Grade I diastolic dysfunction  (impaired relaxation).   2. Right ventricular systolic function is normal. The right ventricular  size is normal. Tricuspid regurgitation signal is inadequate for assessing  PA pressure.   3. The mitral valve is grossly normal. Trivial mitral valve  regurgitation. No evidence of mitral stenosis.   4. The aortic valve is tricuspid. Aortic valve regurgitation is not  visualized. Aortic valve sclerosis is present,  with no evidence of aortic  valve stenosis.   5. There is mild dilatation of the ascending aorta, measuring 43 mm.   6. The inferior vena cava is dilated in size with <50% respiratory  variability, suggesting right atrial pressure of 15 mmHg.   Comparison(s): Changes from prior study are noted. The left ventricular  function is significantly worse.   Conclusion(s)/Recommendation(s): Findings consistent with ischemic  cardiomyopathy.  LHC 03/01/2023:    Lat 1st Mrg lesion is 50% stenosed.   Mid LAD lesion is 100% stenosed.  Balloon angioplasty was performed using a BALL SAPPHIRE NC24 3.25X18.   Post intervention, there is a 0% residual stenosis.   1st Diag lesion is 80% stenosed.  Balloon angioplasty was performed using a BALLN EMERGE MR 2.0X12.   Post intervention, there is a 20% residual stenosis.   There is severe left ventricular systolic dysfunction.   LV end diastolic pressure is moderately elevated.   The left ventricular ejection fraction is less than 25% by visual estimate.   There is no aortic valve stenosis.   In the absence of any other complications or medical issues, we expect the patient to be ready for discharge from an interventional cardiology perspective on 03/04/2023.   Recommend uninterrupted dual antiplatelet therapy with Aspirin 81mg  daily and Prasugrel 10mg  daily for a minimum of 12 months (ACS-Class I recommendation).   Tortuous right subclavian.  Longer guide needed for the left.  EBU 3.75 was sent for.   Successful PTCA of the acute stent thrombosis in the mid LAD stent.  Successful PTCA of the ostial diagonal branch.   It appears that the patient was unable to afford Brilinta at the  time of hospital discharge.  Another prescription has been sent to his home pharmacy.  They did not have the medicine in stock and it was going to take them 30 days to get it according to the patient.  He has been without his Brilinta.  Going forward, he will need to be discharged on  either clopidogrel or prasugrel since both options should be less expensive.  We did load him with Brilinta in the Cath Lab today prior to knowing this history.  Tomorrow, would verify cost and if possible, treat with Effient 60 mg x 1.  After that, he will need Effient 10 mg daily.  He will need aggressive diuresis as well.  40 mg of Lasix IV was given after the case in the Cath Lab.  He will need medical therapy for LV dysfunction as well.   In addition, he had severe right forearm bruising from the prior catheterization.  There is no hematoma but bruising spreading across the medial forearm.  He will be transferred to the CCU.   LHC 02/20/2023:    Lat 1st Mrg lesion is 50% stenosed.   Mid LAD lesion is 85% stenosed.   A stent was successfully placed.   Post intervention, there is a 0% residual stenosis.   1.  High-grade mid LAD lesion treated with OCT guided and optimized PCI with 1 drug-eluting stent. 2.  LVEDP of 19 mmHg.   Recommendation: Overnight observation, dual antiplatelet therapy at least 6 months and preferably 1 year with aggressive cardiovascular risk factor modification.  Echo 02/19/2023:  1. Left ventricular ejection fraction, by estimation, is 55 to 60%. The  left ventricle has normal function. The left ventricle has no regional  wall motion abnormalities. There is mild concentric left ventricular  hypertrophy. Left ventricular diastolic  parameters were normal.   2. Right ventricular systolic function is normal. The right ventricular  size is normal. There is mildly elevated pulmonary artery systolic  pressure. The estimated right ventricular systolic pressure is 39.5 mmHg.   3. The mitral valve is grossly normal. Trivial mitral valve  regurgitation.   4. The aortic valve is tricuspid. Aortic valve regurgitation is not  visualized.   5. The inferior vena cava is normal in size with greater than 50%  respiratory variability, suggesting right atrial pressure of 3 mmHg.    Comparison(s): Prior images reviewed side by side. LVEF remains normal  range at 55-60%.  Physical Exam:   VS:  BP 118/64   Pulse 80   Ht 6' (1.829 m)   Wt 236 lb 6.4 oz (107.2 kg)   SpO2 95%   BMI 32.06 kg/m    Wt Readings from Last 3 Encounters:  03/07/23 236 lb 6.4 oz (107.2 kg)  03/03/23 230 lb 12.8 oz (104.7 kg)  02/20/23 236 lb 5.3 oz (107.2 kg)    GEN: Well nourished, well developed in no acute distress NECK: No JVD; No carotid bruits CARDIAC: S1/S2, RRR, no murmurs, rubs, gallops RESPIRATORY:  Clear to auscultation without rales, wheezing or rhonchi  ABDOMEN: Soft, non-tender, non-distended EXTREMITIES:  Focal, minimal edema along right cath site, significant bruising along right forearm; No deformity   ASSESSMENT AND PLAN: .    CAD, s/p STEMI, bruising and focal right hematoma, medication management Stable with no anginal symptoms. No indication for ischemic evaluation. Continue Aspirin, atorvastatin, bisoprolol, losartan, NTG PRN, and Effient. Will obtain CBC and BMET in 1 week. Discussed post cath precautions and care instructions regarding hematoma. Heart healthy diet  and regular cardiovascular exercise encouraged.     Cardiac Rehabilitation Eligibility Assessment  The patient is ready to start cardiac rehabilitation from a cardiac standpoint.   HFrEF, ICM Stage C, NYHA class I symptoms. Echo 03/02/2023 revealed EF 30-35%. Euvolemic and well compensated on exam. Continue current GDMT. Unable to further titrate with current BP. At next visit will consider switching losartan to [facility]. Low sodium diet, fluid restriction <2L, and daily weights encouraged. Educated to contact our office for weight gain of 2 lbs overnight or 5 lbs in one week. Continue wearing Life Vest for 3 months to to prevent VF/VT.   HTN BP stable. Discussed to monitor BP at home at least 2 hours after medications and sitting for 5-10 minutes. No medication changes at this time. Heart healthy  diet and regular cardiovascular exercise encouraged.   Dispo: Follow-up in 8 weeks with me or APP or sooner if anything changes.   Signed, Sharlene Dory, NP

## 2023-03-07 NOTE — Patient Instructions (Addendum)
Medication Instructions:  Your physician recommends that you continue on your current medications as directed. Please refer to the Current Medication list given to you today.  Labwork: CBC, BMET in 1 week at [facility]  Testing/Procedures: none  Follow-Up: Your physician recommends that you schedule a follow-up appointment in: 8 Weeks with Philis Nettle  Any Other Special Instructions Will Be Listed Below (If Applicable).  If you need a refill on your cardiac medications before your next appointment, please call your pharmacy.

## 2023-03-11 ENCOUNTER — Other Ambulatory Visit (HOSPITAL_COMMUNITY)
Admission: RE | Admit: 2023-03-11 | Discharge: 2023-03-11 | Disposition: A | Discharge status: Home / Self Care | Payer: Medicare Other | Source: Ambulatory Visit | Attending: Nurse Practitioner | Admitting: Nurse Practitioner

## 2023-03-11 DIAGNOSIS — Z79899 Other long term (current) drug therapy: Secondary | ICD-10-CM | POA: Insufficient documentation

## 2023-03-11 LAB — CBC
HCT: 30.8 % — ABNORMAL LOW (ref 39.0–52.0)
Hemoglobin: 10.1 g/dL — ABNORMAL LOW (ref 13.0–17.0)
MCH: 31.6 pg (ref 26.0–34.0)
MCHC: 32.8 g/dL (ref 30.0–36.0)
MCV: 96.3 fL (ref 80.0–100.0)
Platelets: 485 10*3/uL — ABNORMAL HIGH (ref 150–400)
RBC: 3.2 MIL/uL — ABNORMAL LOW (ref 4.22–5.81)
RDW: 16.9 % — ABNORMAL HIGH (ref 11.5–15.5)
WBC: 9.4 10*3/uL (ref 4.0–10.5)
nRBC: 0 % (ref 0.0–0.2)

## 2023-03-11 LAB — BASIC METABOLIC PANEL
Anion gap: 11 (ref 5–15)
BUN: 21 mg/dL (ref 8–23)
CO2: 21 mmol/L — ABNORMAL LOW (ref 22–32)
Calcium: 8.9 mg/dL (ref 8.9–10.3)
Chloride: 105 mmol/L (ref 98–111)
Creatinine, Ser: 1.24 mg/dL (ref 0.61–1.24)
GFR, Estimated: >60 mL/min (ref >60)
Glucose, Bld: 143 mg/dL — ABNORMAL HIGH (ref 70–99)
Potassium: 4.4 mmol/L (ref 3.5–5.1)
Sodium: 137 mmol/L (ref 135–145)

## 2023-03-12 ENCOUNTER — Encounter: Payer: Self-pay | Admitting: *Deleted

## 2023-03-16 ENCOUNTER — Other Ambulatory Visit: Payer: Self-pay | Admitting: Internal Medicine

## 2023-03-19 DIAGNOSIS — R197 Diarrhea, unspecified: Secondary | ICD-10-CM | POA: Diagnosis not present

## 2023-03-19 DIAGNOSIS — I10 Essential (primary) hypertension: Secondary | ICD-10-CM | POA: Diagnosis not present

## 2023-03-19 DIAGNOSIS — E1159 Type 2 diabetes mellitus with other circulatory complications: Secondary | ICD-10-CM | POA: Diagnosis not present

## 2023-03-19 DIAGNOSIS — I509 Heart failure, unspecified: Secondary | ICD-10-CM | POA: Diagnosis not present

## 2023-03-19 DIAGNOSIS — E785 Hyperlipidemia, unspecified: Secondary | ICD-10-CM | POA: Diagnosis not present

## 2023-03-19 DIAGNOSIS — I5020 Unspecified systolic (congestive) heart failure: Secondary | ICD-10-CM | POA: Diagnosis not present

## 2023-03-19 DIAGNOSIS — E1140 Type 2 diabetes mellitus with diabetic neuropathy, unspecified: Secondary | ICD-10-CM | POA: Diagnosis not present

## 2023-03-19 DIAGNOSIS — Z7984 Long term (current) use of oral hypoglycemic drugs: Secondary | ICD-10-CM | POA: Diagnosis not present

## 2023-03-19 DIAGNOSIS — I110 Hypertensive heart disease with heart failure: Secondary | ICD-10-CM | POA: Diagnosis not present

## 2023-03-19 DIAGNOSIS — E1169 Type 2 diabetes mellitus with other specified complication: Secondary | ICD-10-CM | POA: Diagnosis not present

## 2023-03-19 DIAGNOSIS — E669 Obesity, unspecified: Secondary | ICD-10-CM | POA: Diagnosis not present

## 2023-03-19 DIAGNOSIS — I219 Acute myocardial infarction, unspecified: Secondary | ICD-10-CM | POA: Diagnosis not present

## 2023-03-20 ENCOUNTER — Encounter (HOSPITAL_COMMUNITY): Payer: Self-pay

## 2023-03-20 ENCOUNTER — Encounter (HOSPITAL_COMMUNITY)
Admission: RE | Admit: 2023-03-20 | Discharge: 2023-03-20 | Disposition: A | Discharge status: Home / Self Care | Payer: Medicare Other | Source: Ambulatory Visit | Attending: Internal Medicine | Admitting: Internal Medicine

## 2023-03-20 VITALS — BP 104/58 | HR 71 | Ht 71.0 in | Wt 228.9 lb

## 2023-03-20 DIAGNOSIS — Z955 Presence of coronary angioplasty implant and graft: Secondary | ICD-10-CM | POA: Diagnosis not present

## 2023-03-20 DIAGNOSIS — I213 ST elevation (STEMI) myocardial infarction of unspecified site: Secondary | ICD-10-CM | POA: Diagnosis not present

## 2023-03-20 DIAGNOSIS — Z9861 Coronary angioplasty status: Secondary | ICD-10-CM | POA: Diagnosis not present

## 2023-03-20 NOTE — Progress Notes (Addendum)
Cardiac Individual Treatment Plan  Patient Details  Name: Douglas Mason MRN: [id number] Date of Birth: 1950-09-16 Referring Provider:   Flowsheet Row CARDIAC REHAB PHASE II ORIENTATION from 03/20/2023 in [facility]  Referring Provider [name] MD       Initial Encounter Date:  Flowsheet Row CARDIAC REHAB PHASE II ORIENTATION from 03/20/2023 in [place] [facility]  Date 03/20/23       Visit Diagnosis: ST elevation myocardial infarction (STEMI), unspecified artery (HCC)  Status post coronary artery stent placement  Postsurgical percutaneous transluminal coronary angioplasty (PTCA) status  Patient's Home Medications on Admission:  Current Outpatient Medications:    albuterol (PROVENTIL HFA;VENTOLIN HFA) 108 (90 BASE) MCG/ACT inhaler, Inhale 2 puffs into the lungs every 4 (four) hours as needed for wheezing or shortness of breath., Disp: 1 Inhaler, Rfl: 1   albuterol (PROVENTIL) (2.5 MG/3ML) 0.083% nebulizer solution, Take 3 mLs (2.5 mg total) by nebulization every 6 (six) hours as needed for wheezing or shortness of breath., Disp: 75 mL, Rfl: 12   ALPRAZolam (XANAX) 0.5 MG tablet, Take 0.5 mg by mouth at bedtime as needed for anxiety., Disp: , Rfl:    aspirin EC 81 MG tablet, Take 1 tablet (81 mg total) by mouth daily., Disp: 120 tablet, Rfl: 0   atorvastatin (LIPITOR) 80 MG tablet, Take 1 tablet (80 mg total) by mouth daily., Disp: 30 tablet, Rfl: 11   azaTHIOprine (IMURAN) 50 MG tablet, Take 50 mg by mouth daily., Disp: , Rfl:    bisoprolol (ZEBETA) 5 MG tablet, Take 1 tablet (5 mg total) by mouth daily., Disp: 30 tablet, Rfl: 1   empagliflozin (JARDIANCE) 25 MG TABS tablet, Take 1 tablet (25 mg total) by mouth daily., Disp: 30 tablet, Rfl: 11   glipiZIDE (GLUCOTROL XL) 10 MG 24 hr tablet, Take 1 tablet by mouth 2 (two) times a day., Disp: , Rfl:    HYDROcodone-acetaminophen (NORCO/VICODIN) 5-325 MG tablet, Take 1 tablet by mouth  at bedtime as needed for pain., Disp: , Rfl:    isosorbide mononitrate (IMDUR) 30 MG 24 hr tablet, Take 1 tablet (30 mg total) by mouth daily., Disp: 90 tablet, Rfl: 3   losartan (COZAAR) 25 MG tablet, Take 1 tablet (25 mg total) by mouth daily., Disp: 30 tablet, Rfl: 1   Magnesium 400 MG TABS, Take 1 tablet by mouth daily., Disp: , Rfl:    metFORMIN (GLUCOPHAGE-XR) 500 MG 24 hr tablet, Take 2 tablets (1,000 mg total) by mouth 2 (two) times a day., Disp: , Rfl:    nitroGLYCERIN (NITROSTAT) 0.4 MG SL tablet, Place 1 tablet (0.4 mg total) under the tongue every 5 (five) minutes as needed for chest pain., Disp: 25 tablet, Rfl: 1   prasugrel (EFFIENT) 10 MG TABS tablet, Take 1 tablet (10 mg total) by mouth daily., Disp: 30 tablet, Rfl: 11   spironolactone (ALDACTONE) 25 MG tablet, Take 0.5 tablets (12.5 mg total) by mouth daily., Disp: 45 tablet, Rfl: 3   TOUJEO SOLOSTAR 300 UNIT/ML Solostar Pen, Inject 12 Units into the skin daily., Disp: , Rfl:   Past Medical History: Past Medical History:  Diagnosis Date   Bronchitis    Diabetes mellitus without complication (HCC)    GERD (gastroesophageal reflux disease)    High cholesterol    Hypertension    Pleurisy    Pneumonia    Sleep apnea    nurse in pre-op observed pt sats drop to 80-82% while sleeping on RA,wake up would go to  87%    Tobacco Use: Social History   Tobacco Use  Smoking Status Former   Current packs/day: 0.00   Types: Cigarettes   Quit date: 09/02/1976   Years since quitting: 46.5  Smokeless Tobacco Former   Types: Chew   Quit date: 09/03/2003    Labs: Review Flowsheet       Latest Ref Rng & Units 03/13/2014 02/19/2023 03/01/2023  Labs for ITP Cardiac and Pulmonary Rehab  Cholestrol 0 - 200 mg/dL 865  784  -  LDL (calc) 0 - 99 mg/dL 74  67  -  HDL-C >69 mg/dL 34  27  -  Trlycerides <150 mg/dL 68  629  -  Hemoglobin A1c 4.8 - 5.6 % 5.5  6.0  -  TCO2 22 - 32 mmol/L - - 23     Details            Capillary Blood  Glucose: Lab Results  Component Value Date   GLUCAP 148 (H) 03/04/2023   GLUCAP 149 (H) 03/04/2023   GLUCAP 182 (H) 03/03/2023   GLUCAP 147 (H) 03/03/2023   GLUCAP 168 (H) 03/03/2023     Exercise Target Goals: Exercise Program Goal: Individual exercise prescription set using results from initial 6 min walk test and THRR while considering  patient's activity barriers and safety.   Exercise Prescription Goal: Starting with aerobic activity 30 plus minutes a day, 3 days per week for initial exercise prescription. Provide home exercise prescription and guidelines that participant acknowledges understanding prior to discharge.  Activity Barriers & Risk Stratification:  Activity Barriers & Cardiac Risk Stratification - 03/20/23 0820       Activity Barriers & Cardiac Risk Stratification   Activity Barriers Arthritis;Shortness of Breath;Deconditioning;Decreased Ventricular Function;History of Falls;Assistive Device;Balance Concerns;Muscular Weakness    Cardiac Risk Stratification High             6 Minute Walk:  6 Minute Walk     Row Name 03/20/23 0945         6 Minute Walk   Phase Initial     Distance 1348 feet     Walk Time 6 minutes     # of Rest Breaks 0     MPH 2.55     METS 2.67     RPE 11     Perceived Dyspnea  2     VO2 Peak 9.33     Symptoms Yes (comment)     Comments SOB     Resting HR 71 bpm     Resting BP 104/58     Resting Oxygen Saturation  96 %     Exercise Oxygen Saturation  during 6 min walk 93 %     Max Ex. HR 105 bpm     Max Ex. BP 134/72     2 Minute Post BP 128/62              Oxygen Initial Assessment:   Oxygen Re-Evaluation:   Oxygen Discharge (Final Oxygen Re-Evaluation):   Initial Exercise Prescription:  Initial Exercise Prescription - 03/20/23 0900       Date of Initial Exercise RX and Referring Provider   Date 03/20/23    Referring Provider [name] MD      Oxygen   Maintain Oxygen Saturation 88% or  higher      Treadmill   MPH 2    Grade 0.5    Minutes 15    METs 2.59      NuStep  Level 3    SPM 80    Minutes 15    METs 2.5      Prescription Details   Frequency (times per week) 3    Duration Progress to 30 minutes of continuous aerobic without signs/symptoms of physical distress      Intensity   THRR 40-80% of Max Heartrate 101-132    Ratings of Perceived Exertion 11-13    Perceived Dyspnea 0-4      Progression   Progression Continue to progress workloads to maintain intensity without signs/symptoms of physical distress.      Resistance Training   Training Prescription Yes    Weight 4 lb    Reps 10-15             Perform Capillary Blood Glucose checks as needed.  Exercise Prescription Changes:   Exercise Prescription Changes     Row Name 03/20/23 0900             Response to Exercise   Blood Pressure (Admit) 104/58       Blood Pressure (Exercise) 134/72       Blood Pressure (Exit) 128/62       Heart Rate (Admit) 71 bpm       Heart Rate (Exercise) 105 bpm       Heart Rate (Exit) 77 bpm       Oxygen Saturation (Admit) 96 %       Oxygen Saturation (Exercise) 93 %       Rating of Perceived Exertion (Exercise) 11       Perceived Dyspnea (Exercise) 2       Symptoms SOB       Comments walk test results                Exercise Comments:   Exercise Goals and Review:   Exercise Goals     Row Name 03/20/23 1006             Exercise Goals   Increase Physical Activity Yes       Intervention Provide advice, education, support and counseling about physical activity/exercise needs.;Develop an individualized exercise prescription for aerobic and resistive training based on initial evaluation findings, risk stratification, comorbidities and participant's personal goals.       Expected Outcomes Short Term: Attend rehab on a regular basis to increase amount of physical activity.;Long Term: Exercising regularly at least 3-5 days a week.;Long Term:  Add in home exercise to make exercise part of routine and to increase amount of physical activity.       Increase Strength and Stamina Yes       Intervention Provide advice, education, support and counseling about physical activity/exercise needs.;Develop an individualized exercise prescription for aerobic and resistive training based on initial evaluation findings, risk stratification, comorbidities and participant's personal goals.       Expected Outcomes Short Term: Increase workloads from initial exercise prescription for resistance, speed, and METs.;Short Term: Perform resistance training exercises routinely during rehab and add in resistance training at home;Long Term: Improve cardiorespiratory fitness, muscular endurance and strength as measured by increased METs and functional capacity ( )       Able to understand and use rate of perceived exertion (RPE) scale Yes       Intervention Provide education and explanation on how to use RPE scale       Expected Outcomes Short Term: Able to use RPE daily in rehab to express subjective intensity level;Long Term:  Able to use RPE to  guide intensity level when exercising independently       Able to understand and use Dyspnea scale Yes       Intervention Provide education and explanation on how to use Dyspnea scale       Expected Outcomes Short Term: Able to use Dyspnea scale daily in rehab to express subjective sense of shortness of breath during exertion;Long Term: Able to use Dyspnea scale to guide intensity level when exercising independently       Knowledge and understanding of Target Heart Rate Range (THRR) Yes       Intervention Provide education and explanation of THRR including how the numbers were predicted and where they are located for reference       Expected Outcomes Short Term: Able to state/look up THRR;Short Term: Able to use daily as guideline for intensity in rehab;Long Term: Able to use THRR to govern intensity when exercising  independently       Able to check pulse independently Yes       Intervention Provide education and demonstration on how to check pulse in carotid and radial arteries.;Review the importance of being able to check your own pulse for safety during independent exercise       Expected Outcomes Short Term: Able to explain why pulse checking is important during independent exercise;Long Term: Able to check pulse independently and accurately       Understanding of Exercise Prescription Yes       Intervention Provide education, explanation, and written materials on patient's individual exercise prescription       Expected Outcomes Short Term: Able to explain program exercise prescription;Long Term: Able to explain home exercise prescription to exercise independently                Exercise Goals Re-Evaluation :  Exercise Goals Re-Evaluation     Row Name 03/20/23 1006             Exercise Goal Re-Evaluation   Exercise Goals Review Able to understand and use Dyspnea scale;Understanding of Exercise Prescription;Able to understand and use rate of perceived exertion (RPE) scale       Comments Reviewed RPE and dyspnea scale and program prescription with pt today. Pt voiced understanding and was given a copy of goals to take home.       Expected Outcomes Short: Use RPE daily to regulate intensity.  Long: Follow program prescription                 Discharge Exercise Prescription (Final Exercise Prescription Changes):  Exercise Prescription Changes - 03/20/23 0900       Response to Exercise   Blood Pressure (Admit) 104/58    Blood Pressure (Exercise) 134/72    Blood Pressure (Exit) 128/62    Heart Rate (Admit) 71 bpm    Heart Rate (Exercise) 105 bpm    Heart Rate (Exit) 77 bpm    Oxygen Saturation (Admit) 96 %    Oxygen Saturation (Exercise) 93 %    Rating of Perceived Exertion (Exercise) 11    Perceived Dyspnea (Exercise) 2    Symptoms SOB    Comments walk test results              Nutrition:  Target Goals: Understanding of nutrition guidelines, daily intake of sodium 1500mg , cholesterol 200mg , calories 30% from fat and 7% or less from saturated fats, daily to have 5 or more servings of fruits and vegetables.  Biometrics:  Pre Biometrics - 03/20/23 1008  Pre Biometrics   Height 5\' 11"  (1.803 m)    Weight 103.8 kg    Waist Circumference 44 inches    Hip Circumference 39 inches    Waist to Hip Ratio 1.13 %    BMI (Calculated) 31.94    Triceps Skinfold 10 mm    % Body Fat 29.2 %    Grip Strength 10.9 kg    Single Leg Stand 0.8 seconds              Nutrition Therapy Plan and Nutrition Goals:  Nutrition Therapy & Goals - 03/20/23 0853       Personal Nutrition Goals   Comments Handout provided and explained regarding healthier choices.      Intervention Plan   Intervention Nutrition handout(s) given to patient.    Expected Outcomes Short Term Goal: Understand basic principles of dietary content, such as calories, fat, sodium, cholesterol and nutrients.             Nutrition Assessments:  Nutrition Assessments - 03/20/23 0853       MEDFICTS Scores   Pre Score 21            MEDIFICTS Score Key: ?70 Need to make dietary changes  40-70 Heart Healthy Diet ? 40 Therapeutic Level Cholesterol Diet   Picture Your Plate Scores: <16 Unhealthy dietary pattern with much room for improvement. 41-50 Dietary pattern unlikely to meet recommendations for good health and room for improvement. 51-60 More healthful dietary pattern, with some room for improvement.  >60 Healthy dietary pattern, although there may be some specific behaviors that could be improved.    Nutrition Goals Re-Evaluation:   Nutrition Goals Discharge (Final Nutrition Goals Re-Evaluation):   Psychosocial: Target Goals: Acknowledge presence or absence of significant depression and/or stress, maximize coping skills, provide positive support system. Participant  is able to verbalize types and ability to use techniques and skills needed for reducing stress and depression.  Initial Review & Psychosocial Screening:  Initial Psych Review & Screening - 03/20/23 0855       Initial Review   Current issues with Current Anxiety/Panic      Family Dynamics   Good Support System? Yes    Comments Patient's wife and sister are his support people.      Barriers   Psychosocial barriers to participate in program The patient should benefit from training in stress management and relaxation.;There are no identifiable barriers or psychosocial needs.      Screening Interventions   Interventions Encouraged to exercise;To provide support and resources with identified psychosocial needs;Provide feedback about the scores to participant    Expected Outcomes Short Term goal: Utilizing psychosocial counselor, staff and physician to assist with identification of specific Stressors or current issues interfering with healing process. Setting desired goal for each stressor or current issue identified.;Long Term Goal: Stressors or current issues are controlled or eliminated.;Short Term goal: Identification and review with participant of any Quality of Life or Depression concerns found by scoring the questionnaire.;Long Term goal: The participant improves quality of Life and PHQ9 Scores as seen by post scores and/or verbalization of changes             Quality of Life Scores:  Quality of Life - 03/20/23 1012       Quality of Life   Select Quality of Life      Quality of Life Scores   Health/Function Pre 16 %    Socioeconomic Pre 21.93 %    Psych/Spiritual Pre 16.64 %  Family Pre 20.6 %    GLOBAL Pre 18.03 %            Scores of 19 and below usually indicate a poorer quality of life in these areas.  A difference of  2-3 points is a clinically meaningful difference.  A difference of 2-3 points in the total score of the Quality of Life Index has been associated  with significant improvement in overall quality of life, self-image, physical symptoms, and general health in studies assessing change in quality of life.  PHQ-9: Review Flowsheet       03/20/2023  Depression screen PHQ 2/9  Decreased Interest 1  Down, Depressed, Hopeless 1  PHQ - 2 Score 2  Altered sleeping 0  Tired, decreased energy 2  Change in appetite 0  Feeling bad or failure about yourself  0  Trouble concentrating 0  Moving slowly or fidgety/restless 0  Suicidal thoughts 0  PHQ-9 Score 4  Difficult doing work/chores Somewhat difficult    Details           Interpretation of Total Score  Total Score Depression Severity:  1-4 = Minimal depression, 5-9 = Mild depression, 10-14 = Moderate depression, 15-19 = Moderately severe depression, 20-27 = Severe depression   Psychosocial Evaluation and Intervention:  Psychosocial Evaluation - 03/20/23 0917       Psychosocial Evaluation & Interventions   Interventions Stress management education;Relaxation education;Encouraged to exercise with the program and follow exercise prescription    Comments Patient was referred to [facility] with STEMI/Stent placement and PTCA. 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 He is wearing a life vest for VT/VF. Patient is not aware why he is having to wear the vest. It has alarmed one time since he has been wearing. His PHQ-9 score was 4 due to feeling down and frustrutated with not being able to do the things he wants to do. He does have anxiety treated with Xanax at bedtime as needed. He feels his anxiety is managed. He is retired from the [employer]. He lives with his wife who does support him but she is in poor health. He says his main support person is his sister. He has a daughter and a grandchild and is expecting his first great grandchild soon. He has no barriers  identified to participate in the program. His main goals are to build his strength and stamina; to feel better again overall and get back to doing his normal activities.    Expected Outcomes Patient will continue to have no barriers identified and his anxiety will continue to be managed.    Continue Psychosocial Services  Follow up required by staff             Psychosocial Re-Evaluation:   Psychosocial Discharge (Final Psychosocial Re-Evaluation):   Vocational Rehabilitation: Provide vocational rehab assistance to qualifying candidates.   Vocational Rehab Evaluation & Intervention:  Vocational Rehab - 03/20/23 0854       Initial Vocational Rehab Evaluation & Intervention   Assessment shows need for Vocational Rehabilitation No      Vocational Rehab Re-Evaulation   Comments Patient is retired.             Education: Education Goals: Education classes will be provided on a weekly basis, covering required topics. Participant will state understanding/return demonstration of topics presented.  Learning Barriers/Preferences:  Learning Barriers/Preferences -  03/20/23 0854       Learning Barriers/Preferences   Learning Barriers None    Learning Preferences Skilled Demonstration;Audio             Education Topics: Hypertension, Hypertension Reduction -Define heart disease and high blood pressure. Discus how high blood pressure affects the body and ways to reduce high blood pressure.   Exercise and Your Heart -Discuss why it is important to exercise, the FITT principles of exercise, normal and abnormal responses to exercise, and how to exercise safely.   Angina -Discuss definition of angina, causes of angina, treatment of angina, and how to decrease risk of having angina.   Cardiac Medications -Review what the following cardiac medications are used for, how they affect the body, and side effects that may occur when taking the medications.  Medications include  Aspirin, Beta blockers, calcium channel blockers, ACE Inhibitors, angiotensin receptor blockers, diuretics, digoxin, and antihyperlipidemics.   Congestive Heart Failure -Discuss the definition of CHF, how to live with CHF, the signs and symptoms of CHF, and how keep track of weight and sodium intake.   Heart Disease and Intimacy -Discus the effect sexual activity has on the heart, how changes occur during intimacy as we age, and safety during sexual activity.   Smoking Cessation / COPD -Discuss different methods to quit smoking, the health benefits of quitting smoking, and the definition of COPD.   Nutrition I: Fats -Discuss the types of cholesterol, what cholesterol does to the heart, and how cholesterol levels can be controlled.   Nutrition II: Labels -Discuss the different components of food labels and how to read food label   Heart Parts/Heart Disease and PAD -Discuss the anatomy of the heart, the pathway of blood circulation through the heart, and these are affected by heart disease.   Stress I: Signs and Symptoms -Discuss the causes of stress, how stress may lead to anxiety and depression, and ways to limit stress.   Stress II: Relaxation -Discuss different types of relaxation techniques to limit stress.   Warning Signs of Stroke / TIA -Discuss definition of a stroke, what the signs and symptoms are of a stroke, and how to identify when someone is having stroke.   Knowledge Questionnaire Score:  Knowledge Questionnaire Score - 03/20/23 0854       Knowledge Questionnaire Score   Pre Score 19/24             Core Components/Risk Factors/Patient Goals at Admission:  Personal Goals and Risk Factors at Admission - 03/20/23 0854       Core Components/Risk Factors/Patient Goals on Admission    Weight Management Yes;Weight Loss;Obesity    Intervention Weight Management: Develop a combined nutrition and exercise program designed to reach desired caloric intake,  while maintaining appropriate intake of nutrient and fiber, sodium and fats, and appropriate energy expenditure required for the weight goal.;Weight Management: Provide education and appropriate resources to help participant work on and attain dietary goals.;Weight Management/Obesity: Establish reasonable short term and long term weight goals.;Obesity: Provide education and appropriate resources to help participant work on and attain dietary goals.    Admit Weight 228 lb 14.4 oz (103.8 kg)    Goal Weight: Short Term 223 lb (101.2 kg)    Goal Weight: Long Term 218 lb (98.9 kg)    Expected Outcomes Short Term: Continue to assess and modify interventions until short term weight is achieved;Long Term: Adherence to nutrition and physical activity/exercise program aimed toward attainment of established weight goal;Weight Loss: Understanding  of general recommendations for a balanced deficit meal plan, which promotes 1-2 lb weight loss per week and includes a negative energy balance of [phone] kcal/d;Understanding recommendations for meals to include 15-35% energy as protein, 25-35% energy from fat, 35-60% energy from carbohydrates, less than 200mg  of dietary cholesterol, 20-35 gm of total fiber daily;Understanding of distribution of calorie intake throughout the day with the consumption of 4-5 meals/snacks    Improve shortness of breath with ADL's Yes    Intervention Provide education, individualized exercise plan and daily activity instruction to help decrease symptoms of SOB with activities of daily living.    Expected Outcomes Short Term: Improve cardiorespiratory fitness to achieve a reduction of symptoms when performing ADLs;Long Term: Be able to perform more ADLs without symptoms or delay the onset of symptoms    Diabetes Yes    Intervention Provide education about signs/symptoms and action to take for hypo/hyperglycemia.;Provide education about proper nutrition, including hydration, and aerobic/resistive  exercise prescription along with prescribed medications to achieve blood glucose in normal ranges: Fasting glucose 65-99 mg/dL    Expected Outcomes Short Term: Participant verbalizes understanding of the signs/symptoms and immediate care of hyper/hypoglycemia, proper foot care and importance of medication, aerobic/resistive exercise and nutrition plan for blood glucose control.;Long Term: Attainment of HbA1C < 7%.    Heart Failure Yes    Intervention Provide a combined exercise and nutrition program that is supplemented with education, support and counseling about heart failure. Directed toward relieving symptoms such as shortness of breath, decreased exercise tolerance, and extremity edema.    Expected Outcomes Short term: Attendance in program 2-3 days a week with increased exercise capacity. Reported lower sodium intake. Reported increased fruit and vegetable intake. Reports medication compliance.;Short term: Daily weights obtained and reported for increase. Utilizing diuretic protocols set by physician.;Long term: Adoption of self-care skills and reduction of barriers for early signs and symptoms recognition and intervention leading to self-care maintenance.    Hypertension Yes    Intervention Provide education on lifestyle modifcations including regular physical activity/exercise, weight management, moderate sodium restriction and increased consumption of fresh fruit, vegetables, and low fat dairy, alcohol moderation, and smoking cessation.;Monitor prescription use compliance.    Expected Outcomes Short Term: Continued assessment and intervention until BP is < 140/1mm HG in hypertensive participants. < 130/50mm HG in hypertensive participants with diabetes, heart failure or chronic kidney disease.;Long Term: Maintenance of blood pressure at goal levels.    Lipids Yes    Intervention Provide education and support for participant on nutrition & aerobic/resistive exercise along with prescribed  medications to achieve LDL 70mg , HDL >40mg .    Expected Outcomes Short Term: Participant states understanding of desired cholesterol values and is compliant with medications prescribed. Participant is following exercise prescription and nutrition guidelines.;Long Term: Cholesterol controlled with medications as prescribed, with individualized exercise RX and with personalized nutrition plan. Value goals: LDL < 70mg , HDL > 40 mg.             Core Components/Risk Factors/Patient Goals Review:    Core Components/Risk Factors/Patient Goals at Discharge (Final Review):    ITP Comments:  ITP Comments     Row Name 03/20/23 0938           ITP Comments Patient arrived for 1st visit/orientation/education at 0800. Patient was referred to CR by Dr. Izora Ribas due to STEMI/Stent Placement/PTCA. During orientation advised patient on arrival and appointment times what to wear, what to do before, during and after exercise. Reviewed attendance and class policy.  Pt is scheduled to return Cardiac Rehab on 03/24/23 at 0815. Pt was advised to come to class 15 minutes before class starts.  Discussed RPE/Dpysnea scales. Patient participated in warm up stretches. Patient was able to complete 6 minute walk test.  Telemetry Ventricular paced rhythm. Patient was measured for the equipment. Discussed equipment safety with patient. Took patient pre-anthropometric measurements. Patient finished visit at 0920. Marland Kitchen                Comments: Patient arrived for 1st visit/orientation/education at 0800. Patient was referred to CR by Dr. Izora Ribas due to STEMI/Stent Placement/PTCA. During orientation advised patient on arrival and appointment times what to wear, what to do before, during and after exercise. Reviewed attendance and class policy.  Pt is scheduled to return Cardiac Rehab on 03/24/23 at 0815. Pt was advised to come to class 15 minutes before class starts.  Discussed RPE/Dpysnea scales. Patient  participated in warm up stretches. Patient was able to complete 6 minute walk test.  Telemetry Ventricular paced rhythm. Patient was measured for the equipment. Discussed equipment safety with patient. Took patient pre-anthropometric measurements. Patient finished visit at 0920. Marland Kitchen

## 2023-03-20 NOTE — Progress Notes (Signed)
Cardiac Rehab Medication Review by a Nurse   Does the patient  feel that his/her medications are working for him/her?  yes   Has the patient been experiencing any side effects to the medications prescribed?  no   Does the patient measure his/her own blood pressure or blood glucose at home?  yes    Does the patient have any problems obtaining medications due to transportation or finances?   no   Understanding of regimen: good Understanding of indications: good Potential of compliance: good     Medications reviewed with patient at his orientation visit.  Nurse comments:

## 2023-03-20 NOTE — Patient Instructions (Signed)
Patient Instructions  Patient Details  Name: Douglas Mason MRN: [id number] Date of Birth: 06/07/1951 Referring Provider:  Corky Crafts, MD  Below are your personal goals for exercise, nutrition, and risk factors. Our goal is to help you stay on track towards obtaining and maintaining these goals. We will be discussing your progress on these goals with you throughout the program.  Initial Exercise Prescription:  Initial Exercise Prescription - 03/20/23 0900       Date of Initial Exercise RX and Referring Provider   Date 03/20/23    Referring Provider [name] MD      Oxygen   Maintain Oxygen Saturation 88% or higher      Treadmill   MPH 2    Grade 0.5    Minutes 15    METs 2.59      NuStep   Level 3    SPM 80    Minutes 15    METs 2.5      Prescription Details   Frequency (times per week) 3    Duration Progress to 30 minutes of continuous aerobic without signs/symptoms of physical distress      Intensity   THRR 40-80% of Max Heartrate 101-132    Ratings of Perceived Exertion 11-13    Perceived Dyspnea 0-4      Progression   Progression Continue to progress workloads to maintain intensity without signs/symptoms of physical distress.      Resistance Training   Training Prescription Yes    Weight 4 lb    Reps 10-15             Exercise Goals: Frequency: Be able to perform aerobic exercise two to three times per week in program working toward 2-5 days per week of home exercise.  Intensity: Work with a perceived exertion of 11 (fairly light) - 15 (hard) while following your exercise prescription.  We will make changes to your prescription with you as you progress through the program.   Duration: Be able to do 30 to 45 minutes of continuous aerobic exercise in addition to a 5 minute warm-up and a 5 minute cool-down routine.   Nutrition Goals: Your personal nutrition goals will be established when you do your nutrition analysis with the  dietician.  The following are general nutrition guidelines to follow: Cholesterol < 200mg /day Sodium < 1500mg /day Fiber: Men over 50 yrs - 30 grams per day  Personal Goals:  Personal Goals and Risk Factors at Admission - 03/20/23 0854       Core Components/Risk Factors/Patient Goals on Admission    Weight Management Yes;Weight Loss;Obesity    Intervention Weight Management: Develop a combined nutrition and exercise program designed to reach desired caloric intake, while maintaining appropriate intake of nutrient and fiber, sodium and fats, and appropriate energy expenditure required for the weight goal.;Weight Management: Provide education and appropriate resources to help participant work on and attain dietary goals.;Weight Management/Obesity: Establish reasonable short term and long term weight goals.;Obesity: Provide education and appropriate resources to help participant work on and attain dietary goals.    Admit Weight 228 lb 14.4 oz (103.8 kg)    Goal Weight: Short Term 223 lb (101.2 kg)    Goal Weight: Long Term 218 lb (98.9 kg)    Expected Outcomes Short Term: Continue to assess and modify interventions until short term weight is achieved;Long Term: Adherence to nutrition and physical activity/exercise program aimed toward attainment of established weight goal;Weight Loss: Understanding of general recommendations for a balanced  deficit meal plan, which promotes 1-2 lb weight loss per week and includes a negative energy balance of [id number] kcal/d;Understanding recommendations for meals to include 15-35% energy as protein, 25-35% energy from fat, 35-60% energy from carbohydrates, less than 200mg  of dietary cholesterol, 20-35 gm of total fiber daily;Understanding of distribution of calorie intake throughout the day with the consumption of 4-5 meals/snacks    Improve shortness of breath with ADL's Yes    Intervention Provide education, individualized exercise plan and daily activity  instruction to help decrease symptoms of SOB with activities of daily living.    Expected Outcomes Short Term: Improve cardiorespiratory fitness to achieve a reduction of symptoms when performing ADLs;Long Term: Be able to perform more ADLs without symptoms or delay the onset of symptoms    Diabetes Yes    Intervention Provide education about signs/symptoms and action to take for hypo/hyperglycemia.;Provide education about proper nutrition, including hydration, and aerobic/resistive exercise prescription along with prescribed medications to achieve blood glucose in normal ranges: Fasting glucose 65-99 mg/dL    Expected Outcomes Short Term: Participant verbalizes understanding of the signs/symptoms and immediate care of hyper/hypoglycemia, proper foot care and importance of medication, aerobic/resistive exercise and nutrition plan for blood glucose control.;Long Term: Attainment of HbA1C < 7%.    Heart Failure Yes    Intervention Provide a combined exercise and nutrition program that is supplemented with education, support and counseling about heart failure. Directed toward relieving symptoms such as shortness of breath, decreased exercise tolerance, and extremity edema.    Expected Outcomes Short term: Attendance in program 2-3 days a week with increased exercise capacity. Reported lower sodium intake. Reported increased fruit and vegetable intake. Reports medication compliance.;Short term: Daily weights obtained and reported for increase. Utilizing diuretic protocols set by physician.;Long term: Adoption of self-care skills and reduction of barriers for early signs and symptoms recognition and intervention leading to self-care maintenance.    Hypertension Yes    Intervention Provide education on lifestyle modifcations including regular physical activity/exercise, weight management, moderate sodium restriction and increased consumption of fresh fruit, vegetables, and low fat dairy, alcohol moderation, and  smoking cessation.;Monitor prescription use compliance.    Expected Outcomes Short Term: Continued assessment and intervention until BP is < 140/46mm HG in hypertensive participants. < 130/15mm HG in hypertensive participants with diabetes, heart failure or chronic kidney disease.;Long Term: Maintenance of blood pressure at goal levels.    Lipids Yes    Intervention Provide education and support for participant on nutrition & aerobic/resistive exercise along with prescribed medications to achieve LDL 70mg , HDL >40mg .    Expected Outcomes Short Term: Participant states understanding of desired cholesterol values and is compliant with medications prescribed. Participant is following exercise prescription and nutrition guidelines.;Long Term: Cholesterol controlled with medications as prescribed, with individualized exercise RX and with personalized nutrition plan. Value goals: LDL < 70mg , HDL > 40 mg.             Tobacco Use Initial Evaluation: Social History   Tobacco Use  Smoking Status Former   Current packs/day: 0.00   Types: Cigarettes   Quit date: 09/02/1976   Years since quitting: 46.5  Smokeless Tobacco Former   Types: Chew   Quit date: 09/03/2003    Exercise Goals and Review:  Exercise Goals     Row Name 03/20/23 1006             Exercise Goals   Increase Physical Activity Yes       Intervention Provide advice, education,  support and counseling about physical activity/exercise needs.;Develop an individualized exercise prescription for aerobic and resistive training based on initial evaluation findings, risk stratification, comorbidities and participant's personal goals.       Expected Outcomes Short Term: Attend rehab on a regular basis to increase amount of physical activity.;Long Term: Exercising regularly at least 3-5 days a week.;Long Term: Add in home exercise to make exercise part of routine and to increase amount of physical activity.       Increase Strength and  Stamina Yes       Intervention Provide advice, education, support and counseling about physical activity/exercise needs.;Develop an individualized exercise prescription for aerobic and resistive training based on initial evaluation findings, risk stratification, comorbidities and participant's personal goals.       Expected Outcomes Short Term: Increase workloads from initial exercise prescription for resistance, speed, and METs.;Short Term: Perform resistance training exercises routinely during rehab and add in resistance training at home;Long Term: Improve cardiorespiratory fitness, muscular endurance and strength as measured by increased METs and functional capacity ( )       Able to understand and use rate of perceived exertion (RPE) scale Yes       Intervention Provide education and explanation on how to use RPE scale       Expected Outcomes Short Term: Able to use RPE daily in rehab to express subjective intensity level;Long Term:  Able to use RPE to guide intensity level when exercising independently       Able to understand and use Dyspnea scale Yes       Intervention Provide education and explanation on how to use Dyspnea scale       Expected Outcomes Short Term: Able to use Dyspnea scale daily in rehab to express subjective sense of shortness of breath during exertion;Long Term: Able to use Dyspnea scale to guide intensity level when exercising independently       Knowledge and understanding of Target Heart Rate Range (THRR) Yes       Intervention Provide education and explanation of THRR including how the numbers were predicted and where they are located for reference       Expected Outcomes Short Term: Able to state/look up THRR;Short Term: Able to use daily as guideline for intensity in rehab;Long Term: Able to use THRR to govern intensity when exercising independently       Able to check pulse independently Yes       Intervention Provide education and demonstration on how to check pulse  in carotid and radial arteries.;Review the importance of being able to check your own pulse for safety during independent exercise       Expected Outcomes Short Term: Able to explain why pulse checking is important during independent exercise;Long Term: Able to check pulse independently and accurately       Understanding of Exercise Prescription Yes       Intervention Provide education, explanation, and written materials on patient's individual exercise prescription       Expected Outcomes Short Term: Able to explain program exercise prescription;Long Term: Able to explain home exercise prescription to exercise independently                Copy of goals given to participant.

## 2023-03-24 ENCOUNTER — Other Ambulatory Visit (HOSPITAL_COMMUNITY): Payer: Self-pay

## 2023-03-24 ENCOUNTER — Encounter (HOSPITAL_COMMUNITY)
Admission: RE | Admit: 2023-03-24 | Discharge: 2023-03-24 | Disposition: A | Discharge status: Home / Self Care | Payer: Medicare Other | Source: Ambulatory Visit | Attending: Internal Medicine | Admitting: Internal Medicine

## 2023-03-24 DIAGNOSIS — I213 ST elevation (STEMI) myocardial infarction of unspecified site: Secondary | ICD-10-CM

## 2023-03-24 DIAGNOSIS — Z955 Presence of coronary angioplasty implant and graft: Secondary | ICD-10-CM | POA: Diagnosis not present

## 2023-03-24 DIAGNOSIS — Z9861 Coronary angioplasty status: Secondary | ICD-10-CM | POA: Diagnosis not present

## 2023-03-24 LAB — GLUCOSE, CAPILLARY
Glucose-Capillary: 155 mg/dL — ABNORMAL HIGH (ref 70–99)
Glucose-Capillary: 152 mg/dL — ABNORMAL HIGH (ref 70–99)

## 2023-03-24 NOTE — Progress Notes (Signed)
Daily Session Note  Patient Details  Name: Douglas Mason MRN: [id number] Date of Birth: Aug 04, 1951 Referring Provider:   Flowsheet Row CARDIAC REHAB PHASE II ORIENTATION from 03/20/2023 in [facility] CARDIAC REHABILITATION  Referring Provider [name] MD       Encounter Date: 03/24/2023  Check In:  Session Check In - 03/24/23 0816       Check-In   Supervising physician immediately available to respond to emergencies See telemetry face sheet for immediately available MD    Location [facility]    Staff Present Ross Ludwig, BS, Exercise Physiologist;Debra Laural Benes, RN, Thomos Lemons, MA, RCEP, CCRP, CCET    Virtual Visit No    Medication changes reported     No    Fall or balance concerns reported    No    Warm-up and Cool-down Performed on first and last piece of equipment    Resistance Training Performed Yes    VAD Patient? No    PAD/SET Patient? No      Pain Assessment   Currently in Pain? No/denies             Capillary Blood Glucose: No results found for this or any previous visit (from the past 24 hour(s)).    Social History   Tobacco Use  Smoking Status Former   Current packs/day: 0.00   Types: Cigarettes   Quit date: 09/02/1976   Years since quitting: 46.5  Smokeless Tobacco Former   Types: Chew   Quit date: 09/03/2003    Goals Met:  Exercise tolerated well Personal goals reviewed No report of concerns or symptoms today Strength training completed today  Goals Unmet:  Not Applicable  Comments: First full day of exercise!  Patient was oriented to gym and equipment including functions, settings, policies, and procedures.  Patient's individual exercise prescription and treatment plan were reviewed.  All starting workloads were established based on the results of the 6 minute walk test done at initial orientation visit.  The plan for exercise progression was also introduced and progression will be customized based  on patient's performance and goals.    Dr. Dina Rich is Medical Director for [facility] Cardiac Rehab

## 2023-03-26 ENCOUNTER — Encounter (HOSPITAL_COMMUNITY)
Admission: RE | Admit: 2023-03-26 | Discharge: 2023-03-26 | Disposition: A | Discharge status: Home / Self Care | Payer: Medicare Other | Source: Ambulatory Visit | Attending: Internal Medicine | Admitting: Internal Medicine

## 2023-03-26 DIAGNOSIS — Z9861 Coronary angioplasty status: Secondary | ICD-10-CM | POA: Diagnosis not present

## 2023-03-26 DIAGNOSIS — I213 ST elevation (STEMI) myocardial infarction of unspecified site: Secondary | ICD-10-CM

## 2023-03-26 DIAGNOSIS — Z955 Presence of coronary angioplasty implant and graft: Secondary | ICD-10-CM | POA: Diagnosis not present

## 2023-03-26 NOTE — Progress Notes (Signed)
Daily Session Note  Patient Details  Name: Douglas Mason MRN: [id number] Date of Birth: [birth year] Referring Provider:   Flowsheet Row CARDIAC REHAB PHASE II ORIENTATION from 03/20/2023 in [facility] CARDIAC REHABILITATION  Referring Provider [name] MD       Encounter Date: 03/26/2023  Check In:  Session Check In - 03/26/23 0815       Check-In   Supervising physician immediately available to respond to emergencies See telemetry face sheet for immediately available MD    Physician(s) Dr. Wyline Mood    Location [facility]    Staff Present Ross Ludwig, BS, Exercise Physiologist;Hillary Troutman BSN, RN    Virtual Visit No    Medication changes reported     No    Fall or balance concerns reported    No    Warm-up and Cool-down Performed on first and last piece of equipment    Resistance Training Performed Yes    VAD Patient? No    PAD/SET Patient? No      Pain Assessment   Currently in Pain? No/denies    Pain Score 0-No pain    Multiple Pain Sites No             Capillary Blood Glucose: No results found for this or any previous visit (from the past 24 hour(s)).    Social History   Tobacco Use  Smoking Status Former   Current packs/day: 0.00   Types: Cigarettes   Quit date: 09/02/1976   Years since quitting: 46.5  Smokeless Tobacco Former   Types: Chew   Quit date: 09/03/2003    Goals Met:  Independence with exercise equipment Exercise tolerated well No report of concerns or symptoms today Strength training completed today  Goals Unmet:  Not Applicable  Comments: Pt able to follow exercise prescription today without complaint.  Will continue to monitor for progression.    Dr. Dina Rich is Medical Director for [facility] Cardiac Rehab

## 2023-03-28 ENCOUNTER — Encounter (HOSPITAL_COMMUNITY)
Admission: RE | Admit: 2023-03-28 | Discharge: 2023-03-28 | Disposition: A | Discharge status: Home / Self Care | Payer: Medicare Other | Source: Ambulatory Visit | Attending: Internal Medicine | Admitting: Internal Medicine

## 2023-03-28 DIAGNOSIS — I213 ST elevation (STEMI) myocardial infarction of unspecified site: Secondary | ICD-10-CM | POA: Diagnosis not present

## 2023-03-28 DIAGNOSIS — Z955 Presence of coronary angioplasty implant and graft: Secondary | ICD-10-CM

## 2023-03-28 DIAGNOSIS — Z9861 Coronary angioplasty status: Secondary | ICD-10-CM | POA: Diagnosis not present

## 2023-03-28 LAB — GLUCOSE, CAPILLARY
Glucose-Capillary: 88 mg/dL (ref 70–99)
Glucose-Capillary: 99 mg/dL (ref 70–99)
Glucose-Capillary: 139 mg/dL — ABNORMAL HIGH (ref 70–99)
Glucose-Capillary: 157 mg/dL — ABNORMAL HIGH (ref 70–99)

## 2023-03-28 NOTE — Progress Notes (Addendum)
Daily Session Note  Patient Details  Name: Douglas Mason MRN: [id number] Date of Birth: 08/02/1951 Referring Provider:   Flowsheet Row CARDIAC REHAB PHASE II ORIENTATION from 03/20/2023 in [facility] CARDIAC REHABILITATION  Referring Provider [name] MD       Encounter Date: 03/28/2023  Check In:  Session Check In - 03/28/23 0814       Check-In   Supervising physician immediately available to respond to emergencies See telemetry face sheet for immediately available MD    Location [facility]    Staff Present Ross Ludwig, BS, Exercise Physiologist;Daphyne Daphine Deutscher, RN, BSN;Sarahann Horrell, MA, RCEP, CCRP, CCET    Virtual Visit No    Medication changes reported     No    Fall or balance concerns reported    No    Warm-up and Cool-down Performed on first and last piece of equipment    Resistance Training Performed Yes    VAD Patient? No    PAD/SET Patient? No      Pain Assessment   Currently in Pain? No/denies             Capillary Blood Glucose: Results for orders placed or performed during the hospital encounter of 03/26/23 (from the past 24 hour(s))  Glucose, capillary     Status: None   Collection Time: 03/28/23  8:05 AM  Result Value Ref Range   Glucose-Capillary 88 70 - 99 mg/dL      Social History   Tobacco Use  Smoking Status Former   Current packs/day: 0.00   Types: Cigarettes   Quit date: 09/02/1976   Years since quitting: 46.5  Smokeless Tobacco Former   Types: Chew   Quit date: 09/03/2003    Goals Met:  Exercise tolerated well No report of concerns or symptoms today Strength training completed today  Goals Unmet:  Not Applicable  Comments: Pt able to follow exercise prescription today without complaint.  Will continue to monitor for progression.  Douglas Mason started day with a low blood sugar and needed two OJ to get it above 110.  Once above, he was able to exercise.   Dr. Dina Rich is Medical Director for  [facility] Cardiac Rehab

## 2023-03-31 ENCOUNTER — Encounter (HOSPITAL_COMMUNITY): Payer: Medicare Other

## 2023-04-01 ENCOUNTER — Ambulatory Visit: Payer: Self-pay | Admitting: *Deleted

## 2023-04-01 NOTE — Patient Instructions (Addendum)
 Visit Information  Thank you for taking time to visit with me today. Please don't hesitate to contact me if I can be of assistance to you.   Following are the goals we discussed today:   Goals Addressed             This Visit's Progress    Home management of Cardiac and diabetice conditions _ [facility] care coordination services   On track    Interventions Today    Flowsheet Row Most Recent Value  Chronic Disease   Chronic disease during today's visit Diabetes, Hypertension (HTN), Other  [cardiac rehab, 2 MIs, hypoglycemia hypotension]  General Interventions   General Interventions Discussed/Reviewed General Interventions Discussed, Durable Medical Equipment (DME), [facility], Doctor Visits  [reviewed importance of monitoring cbg & BP at home, [facility] [employer], [facility], cardiac rehab services,]  Doctor Visits Discussed/Reviewed Doctor Visits Discussed, PCP  Durable Medical Equipment (DME) BP Cuff, Glucomoter, Other  [encouraged him to heck his blood pressure more thanhe does confirmed he uses his cane for safety]  PCP/Specialist Visits Compliance with follow-up visit  Exercise Interventions   Exercise Discussed/Reviewed Exercise Discussed, Physical Activity, Weight Managment, Assistive device use and maintanence  Physical Activity Discussed/Reviewed Physical Activity Discussed, Types of exercise, Gym, PREP  [discussed cardic rehab, [employer] exercise program, home exercise He confirms he walks up and down his steps between floors]  Weight Management Weight loss  [discussed the maintenance of Diabetes and heart condition will require a change in lifestyle, weight management]  Education Interventions   Education Provided Provided Therapist, sports, Provided Education  Provided Verbal Education On Nutrition, Blood Sugar Monitoring, Mental Health/Coping with Illness, Exercise, Medication, When to see the doctor, [employer], [employer]  [food label reading, eating in moderation variety of  foods, THNSW available for loss of interest, pharmacy available when not able to get insulin, Insurance benefits]  Mental Health Interventions   Mental Health Discussed/Reviewed Mental Health Discussed, Coping Strategies  [discused his loss of interest and his intervention to improve this with the expectation of a new great grand child soon Offered [facility] SW referral but does not prefer]  Nutrition Interventions   Nutrition Discussed/Reviewed Nutrition Discussed, Fluid intake, Decreasing sugar intake, Decreasing fats, Increasing proteins, Carbohydrate meal planning, Decreasing salt  Pharmacy Interventions   Pharmacy Dicussed/Reviewed Pharmacy Topics Discussed, Medications and their functions, Affording Medications  Safety Interventions   Safety Discussed/Reviewed Safety Discussed, Fall Risk              Our next appointment is by telephone on 05/02/23 at 1030  Please call the care guide team at [phone] if you need to cancel or reschedule your appointment.   If you are experiencing a Mental Health or Behavioral Health Crisis or need someone to talk to, please call the Suicide and Crisis Lifeline: 988 call the [place] National Suicide Prevention Lifeline: 262-416-5607 or TTY: 347-080-7365 TTY 201-407-1210) to talk to a trained counselor call 1-800-273-TALK (toll free, 24 hour hotline) call the [facility]: 343-538-0894 call 911   Has excess to internet but no preference for my chart access nor request for an AVS  The patient has been provided with contact information for the care management team and has been advised to call with any health related questions or concerns.   Sydney Hasten L. Noelle Penner, RN, BSN, CCM [facility] Care Management Community Coordinator Office number [phone]

## 2023-04-01 NOTE — Patient Outreach (Signed)
  Care Coordination   Initial Visit Note   12/01/2023 updated note for 04/01/23 Name: Douglas Mason MRN: [id number] DOB: 06-20-1951  Douglas Mason is a 72 y.o. year old male who sees Douglas Mason, Douglas Hazel, MD for primary care. I spoke with  Douglas Mason by phone today.  What matters to the patients health and wellness today?  Diabetes cbg 134 this morning  He reads food labels and  Discussed salt, water & potassium, zero Gatorade  Discuss best choice, moderate Tolerating cardiac rehab - use of treadmill Medicine - had issue back order on insulin recently Got depressed as he is not able to do what they want to but he values great grandfather soon     Goals Addressed             This Visit's Progress    Home management of Cardiac and diabetice conditions _ [facility] care coordination services   On track    Interventions Today    Flowsheet Row Most Recent Value  Chronic Disease   Chronic disease during today's visit Diabetes, Hypertension (HTN), Other  [cardiac rehab, 2 MIs, hypoglycemia hypotension]  General Interventions   General Interventions Discussed/Reviewed General Interventions Discussed, Durable Medical Equipment (DME), [facility], Doctor Visits  [reviewed importance of monitoring cbg & BP at home, [facility] [employer], [facility], cardiac rehab services,]  Doctor Visits Discussed/Reviewed Doctor Visits Discussed, PCP  Durable Medical Equipment (DME) BP Cuff, Glucomoter, Other  [encouraged him to heck his blood pressure more thanhe does confirmed he uses his cane for safety]  PCP/Specialist Visits Compliance with follow-up visit  Exercise Interventions   Exercise Discussed/Reviewed Exercise Discussed, Physical Activity, Weight Managment, Assistive device use and maintanence  Physical Activity Discussed/Reviewed Physical Activity Discussed, Types of exercise, Gym, PREP  [discussed cardic rehab, [employer] exercise program, home exercise He confirms he walks up and down his steps between  floors]  Weight Management Weight loss  [discussed the maintenance of Diabetes and heart condition will require a change in lifestyle, weight management]  Education Interventions   Education Provided Provided Therapist, sports, Provided Education  Provided Verbal Education On Nutrition, Blood Sugar Monitoring, Mental Health/Coping with Illness, Exercise, Medication, When to see the doctor, [facility], [employer]  [food label reading, eating in moderation variety of foods, THNSW available for loss of interest, pharmacy available when not able to get insulin, Insurance benefits]  Mental Health Interventions   Mental Health Discussed/Reviewed Mental Health Discussed, Coping Strategies  [discused his loss of interest and his intervention to improve this with the expectation of a new great grand child soon Offered [facility] SW referral but does not prefer]  Nutrition Interventions   Nutrition Discussed/Reviewed Nutrition Discussed, Fluid intake, Decreasing sugar intake, Decreasing fats, Increasing proteins, Carbohydrate meal planning, Decreasing salt  Pharmacy Interventions   Pharmacy Dicussed/Reviewed Pharmacy Topics Discussed, Medications and their functions, Affording Medications  Safety Interventions   Safety Discussed/Reviewed Safety Discussed, Fall Risk              SDOH assessments and interventions completed:  Yes   Care Coordination Interventions:  Yes, provided   Follow up plan: Follow up call scheduled for 05/02/23    Encounter Outcome:  Pt. Visit Completed   Douglas Mason L. Noelle Penner, RN, BSN, CCM [facility] Care Management Community Coordinator Office number [phone]

## 2023-04-02 ENCOUNTER — Encounter (HOSPITAL_COMMUNITY)
Admission: RE | Admit: 2023-04-02 | Discharge: 2023-04-02 | Disposition: A | Discharge status: Home / Self Care | Payer: Medicare Other | Source: Ambulatory Visit | Attending: Internal Medicine | Admitting: Internal Medicine

## 2023-04-02 DIAGNOSIS — Z955 Presence of coronary angioplasty implant and graft: Secondary | ICD-10-CM | POA: Diagnosis not present

## 2023-04-02 DIAGNOSIS — I213 ST elevation (STEMI) myocardial infarction of unspecified site: Secondary | ICD-10-CM

## 2023-04-02 DIAGNOSIS — Z9861 Coronary angioplasty status: Secondary | ICD-10-CM | POA: Diagnosis not present

## 2023-04-02 LAB — GLUCOSE, CAPILLARY
Glucose-Capillary: 148 mg/dL — ABNORMAL HIGH (ref 70–99)
Glucose-Capillary: 131 mg/dL — ABNORMAL HIGH (ref 70–99)

## 2023-04-02 NOTE — Progress Notes (Signed)
Daily Session Note  Patient Details  Name: Douglas Mason MRN: [id number] Date of Birth: Jul 13, 1951 Referring Provider:   Flowsheet Row CARDIAC REHAB PHASE II ORIENTATION from 03/20/2023 in [facility] CARDIAC REHABILITATION  Referring Provider [name] MD       Encounter Date: 04/02/2023  Check In:  Session Check In - 04/02/23 0815       Check-In   Supervising physician immediately available to respond to emergencies See telemetry face sheet for immediately available MD    Location [facility]    Staff Present Ross Ludwig, BS, Exercise Physiologist;Jessica Juanetta Gosling, MA, RCEP, CCRP, Harolyn Rutherford, RN, BSN    Virtual Visit No    Medication changes reported     No    Fall or balance concerns reported    Yes    Tobacco Cessation No Change    Warm-up and Cool-down Performed on first and last piece of equipment    Resistance Training Performed Yes    VAD Patient? No      Pain Assessment   Currently in Pain? No/denies    Pain Score 0-No pain             Capillary Blood Glucose: Results for orders placed or performed during the hospital encounter of 04/02/23 (from the past 24 hour(s))  Glucose, capillary     Status: Abnormal   Collection Time: 04/02/23  8:04 AM  Result Value Ref Range   Glucose-Capillary 148 (H) 70 - 99 mg/dL      Social History   Tobacco Use  Smoking Status Former   Current packs/day: 0.00   Types: Cigarettes   Quit date: 09/02/1976   Years since quitting: 46.6  Smokeless Tobacco Former   Types: Chew   Quit date: 09/03/2003    Goals Met:  Independence with exercise equipment Exercise tolerated well No report of concerns or symptoms today Strength training completed today  Goals Unmet:  Not Applicable  Comments: Pt able to follow exercise prescription today without complaint.  Will continue to monitor for progression.     Dr. Dina Rich is Medical Director for [facility] Cardiac Rehab

## 2023-04-04 ENCOUNTER — Encounter (HOSPITAL_COMMUNITY)
Admission: RE | Admit: 2023-04-04 | Discharge: 2023-04-04 | Disposition: A | Discharge status: Home / Self Care | Payer: Medicare Other | Source: Ambulatory Visit | Attending: Internal Medicine | Admitting: Internal Medicine

## 2023-04-04 DIAGNOSIS — I5020 Unspecified systolic (congestive) heart failure: Secondary | ICD-10-CM | POA: Diagnosis not present

## 2023-04-04 DIAGNOSIS — I213 ST elevation (STEMI) myocardial infarction of unspecified site: Secondary | ICD-10-CM | POA: Diagnosis not present

## 2023-04-04 DIAGNOSIS — I2510 Atherosclerotic heart disease of native coronary artery without angina pectoris: Secondary | ICD-10-CM | POA: Diagnosis not present

## 2023-04-04 DIAGNOSIS — I252 Old myocardial infarction: Secondary | ICD-10-CM | POA: Diagnosis not present

## 2023-04-04 DIAGNOSIS — I10 Essential (primary) hypertension: Secondary | ICD-10-CM | POA: Diagnosis not present

## 2023-04-04 DIAGNOSIS — Z955 Presence of coronary angioplasty implant and graft: Secondary | ICD-10-CM | POA: Diagnosis not present

## 2023-04-04 DIAGNOSIS — I255 Ischemic cardiomyopathy: Secondary | ICD-10-CM | POA: Insufficient documentation

## 2023-04-04 DIAGNOSIS — Z9861 Coronary angioplasty status: Secondary | ICD-10-CM | POA: Diagnosis not present

## 2023-04-04 NOTE — Progress Notes (Signed)
Daily Session Note  Patient Details  Name: Douglas Mason MRN: [id number] Date of Birth: Nov 22, 1950 Referring Provider:   Flowsheet Row CARDIAC REHAB PHASE II ORIENTATION from 03/20/2023 in [facility] CARDIAC REHABILITATION  Referring Provider [name] MD       Encounter Date: 04/04/2023  Check In:  Session Check In - 04/04/23 0815       Check-In   Supervising physician immediately available to respond to emergencies See telemetry face sheet for immediately available MD    Location [facility]    Staff Present Ross Ludwig, BS, Exercise Physiologist;Jessica Juanetta Gosling, MA, RCEP, CCRP, Harolyn Rutherford, RN, BSN    Virtual Visit No    Medication changes reported     No    Fall or balance concerns reported    Yes    Comments Patient has history of falls and uses a cane.    Tobacco Cessation No Change    Warm-up and Cool-down Performed on first and last piece of equipment    Resistance Training Performed Yes      Pain Assessment   Currently in Pain? No/denies             Capillary Blood Glucose: No results found for this or any previous visit (from the past 24 hour(s)).    Social History   Tobacco Use  Smoking Status Former   Current packs/day: 0.00   Types: Cigarettes   Quit date: 09/02/1976   Years since quitting: 46.6  Smokeless Tobacco Former   Types: Chew   Quit date: 09/03/2003    Goals Met:  Independence with exercise equipment Exercise tolerated well No report of concerns or symptoms today Strength training completed today  Goals Unmet:  Not Applicable  Comments: Pt able to follow exercise prescription today without complaint.  Will continue to monitor for progression.    Dr. Dina Rich is Medical Director for [facility] Cardiac Rehab

## 2023-04-07 ENCOUNTER — Encounter (HOSPITAL_COMMUNITY): Payer: Medicare Other

## 2023-04-08 DIAGNOSIS — E1169 Type 2 diabetes mellitus with other specified complication: Secondary | ICD-10-CM | POA: Diagnosis not present

## 2023-04-08 DIAGNOSIS — Z125 Encounter for screening for malignant neoplasm of prostate: Secondary | ICD-10-CM | POA: Diagnosis not present

## 2023-04-08 DIAGNOSIS — E782 Mixed hyperlipidemia: Secondary | ICD-10-CM | POA: Diagnosis not present

## 2023-04-09 ENCOUNTER — Encounter (HOSPITAL_COMMUNITY)
Admission: RE | Admit: 2023-04-09 | Discharge: 2023-04-09 | Disposition: A | Discharge status: Home / Self Care | Payer: Medicare Other | Source: Ambulatory Visit | Attending: Internal Medicine | Admitting: Internal Medicine

## 2023-04-09 DIAGNOSIS — Z955 Presence of coronary angioplasty implant and graft: Secondary | ICD-10-CM | POA: Diagnosis not present

## 2023-04-09 DIAGNOSIS — Z9861 Coronary angioplasty status: Secondary | ICD-10-CM

## 2023-04-09 DIAGNOSIS — I5020 Unspecified systolic (congestive) heart failure: Secondary | ICD-10-CM | POA: Diagnosis not present

## 2023-04-09 DIAGNOSIS — I213 ST elevation (STEMI) myocardial infarction of unspecified site: Secondary | ICD-10-CM

## 2023-04-09 DIAGNOSIS — I2510 Atherosclerotic heart disease of native coronary artery without angina pectoris: Secondary | ICD-10-CM | POA: Diagnosis not present

## 2023-04-09 DIAGNOSIS — I252 Old myocardial infarction: Secondary | ICD-10-CM | POA: Diagnosis not present

## 2023-04-09 LAB — LAB REPORT - SCANNED
A1c: 5.3
Albumin, Urine POC: 25.6
Creatinine, POC: 132.8 mg/dL
EGFR: 82
Microalb Creat Ratio: 19

## 2023-04-09 NOTE — Progress Notes (Signed)
Daily Session Note  Patient Details  Name: Douglas Mason MRN: [id number] Date of Birth: [birth year] Referring Provider:   Flowsheet Row CARDIAC REHAB PHASE II ORIENTATION from 03/20/2023 in [facility] CARDIAC REHABILITATION  Referring Provider [name] MD       Encounter Date: 04/09/2023  Check In:  Session Check In - 04/09/23 0800       Check-In   Supervising physician immediately available to respond to emergencies See telemetry face sheet for immediately available ER MD    Location [facility]    Staff Present Fabio Pierce, MA, RCEP, CCRP, Dow Adolph, RN, Pleas Koch, RN, BSN    Virtual Visit No    Medication changes reported     No    Fall or balance concerns reported    Yes    Comments Patient has history of falls and uses a cane.    Warm-up and Cool-down Performed on first and last piece of equipment    Resistance Training Performed Yes    VAD Patient? No    PAD/SET Patient? No      Pain Assessment   Currently in Pain? No/denies    Pain Score 0-No pain    Multiple Pain Sites No             Capillary Blood Glucose: No results found for this or any previous visit (from the past 24 hour(s)).    Social History   Tobacco Use  Smoking Status Former   Current packs/day: 0.00   Types: Cigarettes   Quit date: 09/02/1976   Years since quitting: 46.6  Smokeless Tobacco Former   Types: Chew   Quit date: 09/03/2003    Goals Met:  Independence with exercise equipment Exercise tolerated well No report of concerns or symptoms today Strength training completed today  Goals Unmet:  Not Applicable  Comments: Check out 915.   Dr. Dina Rich is Medical Director for [facility] Cardiac Rehab

## 2023-04-11 ENCOUNTER — Encounter (HOSPITAL_COMMUNITY)
Admission: RE | Admit: 2023-04-11 | Discharge: 2023-04-11 | Disposition: A | Discharge status: Home / Self Care | Payer: Medicare Other | Source: Ambulatory Visit | Attending: Internal Medicine | Admitting: Internal Medicine

## 2023-04-11 DIAGNOSIS — Z9861 Coronary angioplasty status: Secondary | ICD-10-CM | POA: Diagnosis not present

## 2023-04-11 DIAGNOSIS — I5020 Unspecified systolic (congestive) heart failure: Secondary | ICD-10-CM | POA: Diagnosis not present

## 2023-04-11 DIAGNOSIS — Z955 Presence of coronary angioplasty implant and graft: Secondary | ICD-10-CM | POA: Diagnosis not present

## 2023-04-11 DIAGNOSIS — I2510 Atherosclerotic heart disease of native coronary artery without angina pectoris: Secondary | ICD-10-CM | POA: Diagnosis not present

## 2023-04-11 DIAGNOSIS — I213 ST elevation (STEMI) myocardial infarction of unspecified site: Secondary | ICD-10-CM | POA: Diagnosis not present

## 2023-04-11 DIAGNOSIS — I252 Old myocardial infarction: Secondary | ICD-10-CM | POA: Diagnosis not present

## 2023-04-11 NOTE — Progress Notes (Signed)
Daily Session Note  Patient Details  Name: Douglas Mason MRN: [id number] Date of Birth: [birth year] Referring Provider:   Flowsheet Row CARDIAC REHAB PHASE II ORIENTATION from 03/20/2023 in [facility] CARDIAC REHABILITATION  Referring Provider [name] MD       Encounter Date: 04/11/2023  Check In:  Session Check In - 04/11/23 0805       Check-In   Supervising physician immediately available to respond to emergencies See telemetry face sheet for immediately available ER MD    Location [facility]    Staff Present Rodena Medin, RN, Pleas Koch, RN, BSN;Other    Virtual Visit No    Medication changes reported     No    Fall or balance concerns reported    Yes    Comments Patient has history of falls and uses a cane.    Warm-up and Cool-down Performed on first and last piece of equipment    Resistance Training Performed Yes    VAD Patient? No    PAD/SET Patient? No      Pain Assessment   Currently in Pain? No/denies    Pain Score 0-No pain    Multiple Pain Sites No             Capillary Blood Glucose: No results found for this or any previous visit (from the past 24 hour(s)).    Social History   Tobacco Use  Smoking Status Former   Current packs/day: 0.00   Types: Cigarettes   Quit date: 09/02/1976   Years since quitting: 46.6  Smokeless Tobacco Former   Types: Chew   Quit date: 09/03/2003    Goals Met:  Independence with exercise equipment Exercise tolerated well No report of concerns or symptoms today Strength training completed today  Goals Unmet:  Not Applicable  Comments: Pt able to follow exercise prescription today without complaint.  Will continue to monitor for progression.    Dr. Dina Rich is Medical Director for [facility] Cardiac Rehab

## 2023-04-14 ENCOUNTER — Ambulatory Visit (HOSPITAL_COMMUNITY): Payer: Medicare Other

## 2023-04-14 ENCOUNTER — Encounter (HOSPITAL_COMMUNITY): Payer: Medicare Other

## 2023-04-14 DIAGNOSIS — R251 Tremor, unspecified: Secondary | ICD-10-CM | POA: Diagnosis not present

## 2023-04-14 DIAGNOSIS — I213 ST elevation (STEMI) myocardial infarction of unspecified site: Secondary | ICD-10-CM

## 2023-04-14 DIAGNOSIS — E669 Obesity, unspecified: Secondary | ICD-10-CM | POA: Diagnosis not present

## 2023-04-14 DIAGNOSIS — E782 Mixed hyperlipidemia: Secondary | ICD-10-CM | POA: Diagnosis not present

## 2023-04-14 DIAGNOSIS — K219 Gastro-esophageal reflux disease without esophagitis: Secondary | ICD-10-CM | POA: Diagnosis not present

## 2023-04-14 DIAGNOSIS — Z955 Presence of coronary angioplasty implant and graft: Secondary | ICD-10-CM

## 2023-04-14 DIAGNOSIS — I509 Heart failure, unspecified: Secondary | ICD-10-CM | POA: Diagnosis not present

## 2023-04-14 DIAGNOSIS — E1369 Other specified diabetes mellitus with other specified complication: Secondary | ICD-10-CM | POA: Diagnosis not present

## 2023-04-14 DIAGNOSIS — Z9861 Coronary angioplasty status: Secondary | ICD-10-CM

## 2023-04-14 DIAGNOSIS — I5020 Unspecified systolic (congestive) heart failure: Secondary | ICD-10-CM | POA: Diagnosis not present

## 2023-04-14 DIAGNOSIS — K754 Autoimmune hepatitis: Secondary | ICD-10-CM | POA: Diagnosis not present

## 2023-04-14 DIAGNOSIS — I2510 Atherosclerotic heart disease of native coronary artery without angina pectoris: Secondary | ICD-10-CM | POA: Diagnosis not present

## 2023-04-14 DIAGNOSIS — I10 Essential (primary) hypertension: Secondary | ICD-10-CM | POA: Diagnosis not present

## 2023-04-14 DIAGNOSIS — F419 Anxiety disorder, unspecified: Secondary | ICD-10-CM | POA: Diagnosis not present

## 2023-04-14 DIAGNOSIS — I110 Hypertensive heart disease with heart failure: Secondary | ICD-10-CM | POA: Diagnosis not present

## 2023-04-14 DIAGNOSIS — I252 Old myocardial infarction: Secondary | ICD-10-CM | POA: Diagnosis not present

## 2023-04-14 NOTE — Progress Notes (Signed)
Daily Session Note  Patient Details  Name: BRAIDAN EDWARDSON MRN: [id number] Date of Birth: 11-12-1950 Referring Provider:   Flowsheet Row CARDIAC REHAB PHASE II ORIENTATION from 03/20/2023 in [facility] CARDIAC REHABILITATION  Referring Provider [name] MD       Encounter Date: 04/14/2023  Check In:  Session Check In - 04/14/23 0725       Check-In   Supervising physician immediately available to respond to emergencies See telemetry face sheet for immediately available MD    Staff Present Ross Ludwig, BS, Exercise Physiologist;Jessica Juanetta Gosling, MA, RCEP, CCRP, Harolyn Rutherford, RN, BSN    Virtual Visit No    Medication changes reported     No    Fall or balance concerns reported    Yes    Comments Patient has history of falls and uses a cane.    Tobacco Cessation No Change    Warm-up and Cool-down Performed on first and last piece of equipment    Resistance Training Performed Yes    VAD Patient? No      Pain Assessment   Currently in Pain? No/denies    Pain Score 0-No pain             Capillary Blood Glucose: No results found for this or any previous visit (from the past 24 hour(s)).    Social History   Tobacco Use  Smoking Status Former   Current packs/day: 0.00   Types: Cigarettes   Quit date: 09/02/1976   Years since quitting: 46.6  Smokeless Tobacco Former   Types: Chew   Quit date: 09/03/2003    Goals Met:  Independence with exercise equipment Exercise tolerated well No report of concerns or symptoms today Strength training completed today  Goals Unmet:  Not Applicable  Comments: Pt able to follow exercise prescription today without complaint.  Will continue to monitor for progression.    Dr. Dina Rich is Medical Director for [facility] Cardiac Rehab

## 2023-04-16 ENCOUNTER — Encounter (HOSPITAL_COMMUNITY): Payer: Self-pay | Admitting: *Deleted

## 2023-04-16 ENCOUNTER — Encounter (HOSPITAL_COMMUNITY): Payer: Medicare Other

## 2023-04-16 DIAGNOSIS — Z9861 Coronary angioplasty status: Secondary | ICD-10-CM | POA: Diagnosis not present

## 2023-04-16 DIAGNOSIS — Z955 Presence of coronary angioplasty implant and graft: Secondary | ICD-10-CM

## 2023-04-16 DIAGNOSIS — I213 ST elevation (STEMI) myocardial infarction of unspecified site: Secondary | ICD-10-CM

## 2023-04-16 DIAGNOSIS — I5020 Unspecified systolic (congestive) heart failure: Secondary | ICD-10-CM | POA: Diagnosis not present

## 2023-04-16 DIAGNOSIS — I252 Old myocardial infarction: Secondary | ICD-10-CM | POA: Diagnosis not present

## 2023-04-16 DIAGNOSIS — I2510 Atherosclerotic heart disease of native coronary artery without angina pectoris: Secondary | ICD-10-CM | POA: Diagnosis not present

## 2023-04-16 NOTE — Progress Notes (Signed)
Daily Session Note  Patient Details  Name: Douglas Mason MRN: [id number] Date of Birth: 05/31/1951 Referring Provider:   Flowsheet Row CARDIAC REHAB PHASE II ORIENTATION from 03/20/2023 in [facility] CARDIAC REHABILITATION  Referring Provider [name] MD       Encounter Date: 04/16/2023  Check In:  Session Check In - 04/16/23 0745       Check-In   Supervising physician immediately available to respond to emergencies See telemetry face sheet for immediately available MD    Physician(s) Dr. Wyline Mood    Location [facility]    Staff Present Ross Ludwig, BS, Exercise Physiologist;Jessica Juanetta Gosling, MA, RCEP, CCRP, CCET    Virtual Visit No    Medication changes reported     No    Fall or balance concerns reported    Yes    Comments Patient has history of falls and uses a cane.    Tobacco Cessation No Change    Warm-up and Cool-down Performed on first and last piece of equipment    Resistance Training Performed Yes    VAD Patient? No    PAD/SET Patient? No      Pain Assessment   Currently in Pain? No/denies    Pain Score 0-No pain    Multiple Pain Sites No             Capillary Blood Glucose: No results found for this or any previous visit (from the past 24 hour(s)).    Social History   Tobacco Use  Smoking Status Former   Current packs/day: 0.00   Types: Cigarettes   Quit date: 09/02/1976   Years since quitting: 46.6  Smokeless Tobacco Former   Types: Chew   Quit date: 09/03/2003    Goals Met:  Independence with exercise equipment Exercise tolerated well No report of concerns or symptoms today Strength training completed today  Goals Unmet:  Not Applicable  Comments: Pt able to follow exercise prescription today without complaint.  Will continue to monitor for progression.    Dr. Dina Rich is Medical Director for [facility] Cardiac Rehab

## 2023-04-16 NOTE — Progress Notes (Signed)
Cardiac Individual Treatment Plan  Patient Details  Name: Douglas Mason MRN: [id number] Date of Birth: [birth year] Referring Provider:   Flowsheet Row CARDIAC REHAB PHASE II ORIENTATION from 03/20/2023 in [facility]  Referring Provider [name] MD       Initial Encounter Date:  Flowsheet Row CARDIAC REHAB PHASE II ORIENTATION from 03/20/2023 in [place] [facility]  Date 03/20/23       Visit Diagnosis: ST elevation myocardial infarction (STEMI), unspecified artery (HCC)  Status post coronary artery stent placement  Postsurgical percutaneous transluminal coronary angioplasty (PTCA) status  Patient's Home Medications on Admission:  Current Outpatient Medications:    albuterol (PROVENTIL HFA;VENTOLIN HFA) 108 (90 BASE) MCG/ACT inhaler, Inhale 2 puffs into the lungs every 4 (four) hours as needed for wheezing or shortness of breath., Disp: 1 Inhaler, Rfl: 1   albuterol (PROVENTIL) (2.5 MG/3ML) 0.083% nebulizer solution, Take 3 mLs (2.5 mg total) by nebulization every 6 (six) hours as needed for wheezing or shortness of breath., Disp: 75 mL, Rfl: 12   ALPRAZolam (XANAX) 0.5 MG tablet, Take 0.5 mg by mouth at bedtime as needed for anxiety., Disp: , Rfl:    aspirin EC 81 MG tablet, Take 1 tablet (81 mg total) by mouth daily., Disp: 120 tablet, Rfl: 0   atorvastatin (LIPITOR) 80 MG tablet, Take 1 tablet (80 mg total) by mouth daily., Disp: 30 tablet, Rfl: 11   azaTHIOprine (IMURAN) 50 MG tablet, take 1 tablet by mouth once daily, Disp: 30 tablet, Rfl: 0   bisoprolol (ZEBETA) 5 MG tablet, Take 1 tablet (5 mg total) by mouth daily., Disp: 30 tablet, Rfl: 1   empagliflozin (JARDIANCE) 25 MG TABS tablet, Take 1 tablet (25 mg total) by mouth daily., Disp: 30 tablet, Rfl: 11   glipiZIDE (GLUCOTROL XL) 10 MG 24 hr tablet, Take 1 tablet by mouth 2 (two) times a day., Disp: , Rfl:    HYDROcodone-acetaminophen (NORCO/VICODIN) 5-325 MG tablet, Take  1 tablet by mouth at bedtime as needed for pain., Disp: , Rfl:    isosorbide mononitrate (IMDUR) 30 MG 24 hr tablet, Take 1 tablet (30 mg total) by mouth daily., Disp: 90 tablet, Rfl: 3   losartan (COZAAR) 25 MG tablet, Take 1 tablet (25 mg total) by mouth daily., Disp: 30 tablet, Rfl: 1   Magnesium 400 MG TABS, Take 1 tablet by mouth daily., Disp: , Rfl:    metFORMIN (GLUCOPHAGE-XR) 500 MG 24 hr tablet, Take 2 tablets (1,000 mg total) by mouth 2 (two) times a day., Disp: , Rfl:    nitroGLYCERIN (NITROSTAT) 0.4 MG SL tablet, Place 1 tablet (0.4 mg total) under the tongue every 5 (five) minutes as needed for chest pain., Disp: 25 tablet, Rfl: 1   prasugrel (EFFIENT) 10 MG TABS tablet, Take 1 tablet (10 mg total) by mouth daily., Disp: 30 tablet, Rfl: 11   spironolactone (ALDACTONE) 25 MG tablet, Take 0.5 tablets (12.5 mg total) by mouth daily., Disp: 45 tablet, Rfl: 3   TOUJEO SOLOSTAR 300 UNIT/ML Solostar Pen, Inject 12 Units into the skin daily., Disp: , Rfl:   Past Medical History: Past Medical History:  Diagnosis Date   Bronchitis    Diabetes mellitus without complication (HCC)    GERD (gastroesophageal reflux disease)    High cholesterol    Hypertension    Pleurisy    Pneumonia    Sleep apnea    nurse in pre-op observed pt sats drop to 80-82% while sleeping on RA,wake up would  go to 87%    Tobacco Use: Social History   Tobacco Use  Smoking Status Former   Current packs/day: 0.00   Types: Cigarettes   Quit date: 09/02/1976   Years since quitting: 46.6  Smokeless Tobacco Former   Types: Chew   Quit date: 09/03/2003    Labs: Review Flowsheet       Latest Ref Rng & Units 03/13/2014 02/19/2023 03/01/2023  Labs for ITP Cardiac and Pulmonary Rehab  Cholestrol 0 - 200 mg/dL 604  540  -  LDL (calc) 0 - 99 mg/dL 74  67  -  HDL-C >98 mg/dL 34  27  -  Trlycerides <150 mg/dL 68  119  -  Hemoglobin A1c 4.8 - 5.6 % 5.5  6.0  -  TCO2 22 - 32 mmol/L - - 23     Details             Capillary Blood Glucose: Lab Results  Component Value Date   GLUCAP 131 (H) 04/02/2023   GLUCAP 148 (H) 04/02/2023   GLUCAP 157 (H) 03/28/2023   GLUCAP 139 (H) 03/28/2023   GLUCAP 99 03/28/2023     Exercise Target Goals: Exercise Program Goal: Individual exercise prescription set using results from initial 6 min walk test and THRR while considering  patient's activity barriers and safety.   Exercise Prescription Goal: Starting with aerobic activity 30 plus minutes a day, 3 days per week for initial exercise prescription. Provide home exercise prescription and guidelines that participant acknowledges understanding prior to discharge.  Activity Barriers & Risk Stratification:  Activity Barriers & Cardiac Risk Stratification - 03/20/23 0820       Activity Barriers & Cardiac Risk Stratification   Activity Barriers Arthritis;Shortness of Breath;Deconditioning;Decreased Ventricular Function;History of Falls;Assistive Device;Balance Concerns;Muscular Weakness    Cardiac Risk Stratification High             6 Minute Walk:  6 Minute Walk     Row Name 03/20/23 0945         6 Minute Walk   Phase Initial     Distance 1348 feet     Walk Time 6 minutes     # of Rest Breaks 0     MPH 2.55     METS 2.67     RPE 11     Perceived Dyspnea  2     VO2 Peak 9.33     Symptoms Yes (comment)     Comments SOB     Resting HR 71 bpm     Resting BP 104/58     Resting Oxygen Saturation  96 %     Exercise Oxygen Saturation  during 6 min walk 93 %     Max Ex. HR 105 bpm     Max Ex. BP 134/72     2 Minute Post BP 128/62              Oxygen Initial Assessment:   Oxygen Re-Evaluation:   Oxygen Discharge (Final Oxygen Re-Evaluation):   Initial Exercise Prescription:  Initial Exercise Prescription - 03/20/23 0900       Date of Initial Exercise RX and Referring Provider   Date 03/20/23    Referring Provider [name] MD      Oxygen   Maintain Oxygen  Saturation 88% or higher      Treadmill   MPH 2    Grade 0.5    Minutes 15    METs 2.59      NuStep  Level 3    SPM 80    Minutes 15    METs 2.5      Prescription Details   Frequency (times per week) 3    Duration Progress to 30 minutes of continuous aerobic without signs/symptoms of physical distress      Intensity   THRR 40-80% of Max Heartrate 101-132    Ratings of Perceived Exertion 11-13    Perceived Dyspnea 0-4      Progression   Progression Continue to progress workloads to maintain intensity without signs/symptoms of physical distress.      Resistance Training   Training Prescription Yes    Weight 4 lb    Reps 10-15             Perform Capillary Blood Glucose checks as needed.  Exercise Prescription Changes:   Exercise Prescription Changes     Row Name 03/20/23 0900 03/28/23 1200           Response to Exercise   Blood Pressure (Admit) 104/58 116/64      Blood Pressure (Exercise) 134/72 126/64      Blood Pressure (Exit) 128/62 100/50      Heart Rate (Admit) 71 bpm 66 bpm      Heart Rate (Exercise) 105 bpm 94 bpm      Heart Rate (Exit) 77 bpm 60 bpm      Oxygen Saturation (Admit) 96 % --      Oxygen Saturation (Exercise) 93 % --      Rating of Perceived Exertion (Exercise) 11 12      Perceived Dyspnea (Exercise) 2 --      Symptoms SOB --      Comments walk test results --      Duration -- Continue with 30 min of aerobic exercise without signs/symptoms of physical distress.      Intensity -- THRR unchanged        Progression   Progression -- Continue to progress workloads to maintain intensity without signs/symptoms of physical distress.        Resistance Training   Training Prescription -- Yes      Weight -- 4      Reps -- 10-15        Treadmill   MPH -- 2      Grade -- 0.5      Minutes -- 15      METs -- 2.67        NuStep   Level -- 3      SPM -- 84      Minutes -- 15      METs -- 2.07        Oxygen   Maintain Oxygen  Saturation -- 88% or higher               Exercise Comments:   Exercise Comments     Row Name 03/24/23 0818           Exercise Comments First full day of exercise!  Patient was oriented to gym and equipment including functions, settings, policies, and procedures.  Patient's individual exercise prescription and treatment plan were reviewed.  All starting workloads were established based on the results of the 6 minute walk test done at initial orientation visit.  The plan for exercise progression was also introduced and progression will be customized based on patient's performance and goals.                Exercise Goals and Review:  Exercise Goals     Row Name 03/20/23 1006             Exercise Goals   Increase Physical Activity Yes       Intervention Provide advice, education, support and counseling about physical activity/exercise needs.;Develop an individualized exercise prescription for aerobic and resistive training based on initial evaluation findings, risk stratification, comorbidities and participant's personal goals.       Expected Outcomes Short Term: Attend rehab on a regular basis to increase amount of physical activity.;Long Term: Exercising regularly at least 3-5 days a week.;Long Term: Add in home exercise to make exercise part of routine and to increase amount of physical activity.       Increase Strength and Stamina Yes       Intervention Provide advice, education, support and counseling about physical activity/exercise needs.;Develop an individualized exercise prescription for aerobic and resistive training based on initial evaluation findings, risk stratification, comorbidities and participant's personal goals.       Expected Outcomes Short Term: Increase workloads from initial exercise prescription for resistance, speed, and METs.;Short Term: Perform resistance training exercises routinely during rehab and add in resistance training at home;Long Term:  Improve cardiorespiratory fitness, muscular endurance and strength as measured by increased METs and functional capacity ( )       Able to understand and use rate of perceived exertion (RPE) scale Yes       Intervention Provide education and explanation on how to use RPE scale       Expected Outcomes Short Term: Able to use RPE daily in rehab to express subjective intensity level;Long Term:  Able to use RPE to guide intensity level when exercising independently       Able to understand and use Dyspnea scale Yes       Intervention Provide education and explanation on how to use Dyspnea scale       Expected Outcomes Short Term: Able to use Dyspnea scale daily in rehab to express subjective sense of shortness of breath during exertion;Long Term: Able to use Dyspnea scale to guide intensity level when exercising independently       Knowledge and understanding of Target Heart Rate Range (THRR) Yes       Intervention Provide education and explanation of THRR including how the numbers were predicted and where they are located for reference       Expected Outcomes Short Term: Able to state/look up THRR;Short Term: Able to use daily as guideline for intensity in rehab;Long Term: Able to use THRR to govern intensity when exercising independently       Able to check pulse independently Yes       Intervention Provide education and demonstration on how to check pulse in carotid and radial arteries.;Review the importance of being able to check your own pulse for safety during independent exercise       Expected Outcomes Short Term: Able to explain why pulse checking is important during independent exercise;Long Term: Able to check pulse independently and accurately       Understanding of Exercise Prescription Yes       Intervention Provide education, explanation, and written materials on patient's individual exercise prescription       Expected Outcomes Short Term: Able to explain program exercise  prescription;Long Term: Able to explain home exercise prescription to exercise independently                Exercise Goals Re-Evaluation :  Exercise Goals Re-Evaluation  Row Name 03/20/23 1006 04/01/23 1021 04/04/23 0812         Exercise Goal Re-Evaluation   Exercise Goals Review Able to understand and use Dyspnea scale;Understanding of Exercise Prescription;Able to understand and use rate of perceived exertion (RPE) scale -- Increase Physical Activity;Increase Strength and Stamina;Understanding of Exercise Prescription     Comments Reviewed RPE and dyspnea scale and program prescription with pt today. Pt voiced understanding and was given a copy of goals to take home. Pt has not increased his workloads yet in classs but is working at 12 on the RPE scale. He is still fairly new to the program and should start to increase workloads soon Gabriel Rung states that he still gets short winded when going up stairs. Exercising has been helping build enduracne and he has noticed more strength in his legs since starting the program. [name] walks around his circle driveway most days during the evening due to heat.     Expected Outcomes Short: Use RPE daily to regulate intensity.  Long: Follow program prescription -- Short term: go over home exericse   long term: continue to exercise and attened rehab               Discharge Exercise Prescription (Final Exercise Prescription Changes):  Exercise Prescription Changes - 03/28/23 1200       Response to Exercise   Blood Pressure (Admit) 116/64    Blood Pressure (Exercise) 126/64    Blood Pressure (Exit) 100/50    Heart Rate (Admit) 66 bpm    Heart Rate (Exercise) 94 bpm    Heart Rate (Exit) 60 bpm    Rating of Perceived Exertion (Exercise) 12    Duration Continue with 30 min of aerobic exercise without signs/symptoms of physical distress.    Intensity THRR unchanged      Progression   Progression Continue to progress workloads to maintain intensity  without signs/symptoms of physical distress.      Resistance Training   Training Prescription Yes    Weight 4    Reps 10-15      Treadmill   MPH 2    Grade 0.5    Minutes 15    METs 2.67      NuStep   Level 3    SPM 84    Minutes 15    METs 2.07      Oxygen   Maintain Oxygen Saturation 88% or higher             Nutrition:  Target Goals: Understanding of nutrition guidelines, daily intake of sodium 1500mg , cholesterol 200mg , calories 30% from fat and 7% or less from saturated fats, daily to have 5 or more servings of fruits and vegetables.  Biometrics:  Pre Biometrics - 03/20/23 1008       Pre Biometrics   Height 5\' 11"  (1.803 m)    Weight 228 lb 14.4 oz (103.8 kg)    Waist Circumference 44 inches    Hip Circumference 39 inches    Waist to Hip Ratio 1.13 %    BMI (Calculated) 31.94    Triceps Skinfold 10 mm    % Body Fat 29.2 %    Grip Strength 10.9 kg    Single Leg Stand 0.8 seconds              Nutrition Therapy Plan and Nutrition Goals:  Nutrition Therapy & Goals - 03/20/23 0853       Personal Nutrition Goals   Comments Handout provided and explained  regarding healthier choices.      Intervention Plan   Intervention Nutrition handout(s) given to patient.    Expected Outcomes Short Term Goal: Understand basic principles of dietary content, such as calories, fat, sodium, cholesterol and nutrients.             Nutrition Assessments:  Nutrition Assessments - 03/20/23 0853       MEDFICTS Scores   Pre Score 21            MEDIFICTS Score Key: ?70 Need to make dietary changes  40-70 Heart Healthy Diet ? 40 Therapeutic Level Cholesterol Diet   Picture Your Plate Scores: <69 Unhealthy dietary pattern with much room for improvement. 41-50 Dietary pattern unlikely to meet recommendations for good health and room for improvement. 51-60 More healthful dietary pattern, with some room for improvement.  >60 Healthy dietary pattern,  although there may be some specific behaviors that could be improved.    Nutrition Goals Re-Evaluation:  Nutrition Goals Re-Evaluation     Row Name 04/04/23 0821             Goals   Nutrition Goal Healthy eating       Comment Has started to eat more for breakfest since starting rehab to help control blood suger. He eats less red meat and eats more chicken and broild pork. He has cut down on his portion size. He no longer eats TV/precooked dinners. He eats popcorn to help cut cravings when needing a snack.       Expected Outcome Short term: find healthy snack options other then popcorn   long term: Continue to eat healthy food options                Nutrition Goals Discharge (Final Nutrition Goals Re-Evaluation):  Nutrition Goals Re-Evaluation - 04/04/23 0821       Goals   Nutrition Goal Healthy eating    Comment Has started to eat more for breakfest since starting rehab to help control blood suger. He eats less red meat and eats more chicken and broild pork. He has cut down on his portion size. He no longer eats TV/precooked dinners. He eats popcorn to help cut cravings when needing a snack.    Expected Outcome Short term: find healthy snack options other then popcorn   long term: Continue to eat healthy food options             Psychosocial: Target Goals: Acknowledge presence or absence of significant depression and/or stress, maximize coping skills, provide positive support system. Participant is able to verbalize types and ability to use techniques and skills needed for reducing stress and depression.  Initial Review & Psychosocial Screening:  Initial Psych Review & Screening - 03/20/23 0855       Initial Review   Current issues with Current Anxiety/Panic      Family Dynamics   Good Support System? Yes    Comments Patient's wife and sister are his support people.      Barriers   Psychosocial barriers to participate in program The patient should benefit from  training in stress management and relaxation.;There are no identifiable barriers or psychosocial needs.      Screening Interventions   Interventions Encouraged to exercise;To provide support and resources with identified psychosocial needs;Provide feedback about the scores to participant    Expected Outcomes Short Term goal: Utilizing psychosocial counselor, staff and physician to assist with identification of specific Stressors or current issues interfering with healing process. Setting desired  goal for each stressor or current issue identified.;Long Term Goal: Stressors or current issues are controlled or eliminated.;Short Term goal: Identification and review with participant of any Quality of Life or Depression concerns found by scoring the questionnaire.;Long Term goal: The participant improves quality of Life and PHQ9 Scores as seen by post scores and/or verbalization of changes             Quality of Life Scores:  Quality of Life - 03/20/23 1012       Quality of Life   Select Quality of Life      Quality of Life Scores   Health/Function Pre 16 %    Socioeconomic Pre 21.93 %    Psych/Spiritual Pre 16.64 %    Family Pre 20.6 %    GLOBAL Pre 18.03 %            Scores of 19 and below usually indicate a poorer quality of life in these areas.  A difference of  2-3 points is a clinically meaningful difference.  A difference of 2-3 points in the total score of the Quality of Life Index has been associated with significant improvement in overall quality of life, self-image, physical symptoms, and general health in studies assessing change in quality of life.  PHQ-9: Review Flowsheet       03/20/2023  Depression screen PHQ 2/9  Decreased Interest 1  Down, Depressed, Hopeless 1  PHQ - 2 Score 2  Altered sleeping 0  Tired, decreased energy 2  Change in appetite 0  Feeling bad or failure about yourself  0  Trouble concentrating 0  Moving slowly or fidgety/restless 0  Suicidal  thoughts 0  PHQ-9 Score 4  Difficult doing work/chores Somewhat difficult    Details           Interpretation of Total Score  Total Score Depression Severity:  1-4 = Minimal depression, 5-9 = Mild depression, 10-14 = Moderate depression, 15-19 = Moderately severe depression, 20-27 = Severe depression   Psychosocial Evaluation and Intervention:  Psychosocial Evaluation - 03/20/23 0917       Psychosocial Evaluation & Interventions   Interventions Stress management education;Relaxation education;Encouraged to exercise with the program and follow exercise prescription    Comments Patient was referred to CR with STEMI/Stent placement and PTCA. 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 He is wearing a life vest for VT/VF. Patient is not aware why he is having to wear the vest. It has alarmed one time since he has been wearing. His PHQ-9 score was 4 due to feeling down and frustrutated with not being able to do the things he wants to do. He does have anxiety treated with Xanax at bedtime as needed. He feels his anxiety is managed. He is retired from the [employer]. He lives with his wife who does support him but she is in poor health. He says his main support person is his sister. He has a daughter and a grandchild and is expecting his first great grandchild soon. He has no barriers identified to participate in the program. His main goals are to build his strength and stamina; to feel better again overall and get back to doing his normal activities.    Expected Outcomes Patient will continue to have no barriers identified and his anxiety will continue to be managed.    Continue Psychosocial Services  Follow up required by staff             Psychosocial Re-Evaluation:  Psychosocial Re-Evaluation     Row Name 04/04/23 [phone]             Psychosocial  Re-Evaluation   Current issues with None Identified       Comments Only stressor is his defib vest that he is ready to have off. States that he is very happy with everything in his life and has enjoyed coming to rehab to socialize and improve health       Expected Outcomes Short term: continue to exercise to promote happy     long term: continue to identify stressor and life a happy life       Interventions Stress management education;Relaxation education;Encouraged to attend Cardiac Rehabilitation for the exercise       Continue Psychosocial Services  Follow up required by staff                Psychosocial Discharge (Final Psychosocial Re-Evaluation):  Psychosocial Re-Evaluation - 04/04/23 0816       Psychosocial Re-Evaluation   Current issues with None Identified    Comments Only stressor is his defib vest that he is ready to have off. States that he is very happy with everything in his life and has enjoyed coming to rehab to socialize and improve health    Expected Outcomes Short term: continue to exercise to promote happy     long term: continue to identify stressor and life a happy life    Interventions Stress management education;Relaxation education;Encouraged to attend Cardiac Rehabilitation for the exercise    Continue Psychosocial Services  Follow up required by staff             Vocational Rehabilitation: Provide vocational rehab assistance to qualifying candidates.   Vocational Rehab Evaluation & Intervention:  Vocational Rehab - 03/20/23 0854       Initial Vocational Rehab Evaluation & Intervention   Assessment shows need for Vocational Rehabilitation No      Vocational Rehab Re-Evaulation   Comments Patient is retired.             Education: Education Goals: Education classes will be provided on a weekly basis, covering required topics. Participant will state understanding/return demonstration of topics presented.  Learning Barriers/Preferences:   Learning Barriers/Preferences - 03/20/23 0854       Learning Barriers/Preferences   Learning Barriers None    Learning Preferences Skilled Demonstration;Audio             Education Topics: Hypertension, Hypertension Reduction -Define heart disease and high blood pressure. Discus how high blood pressure affects the body and ways to reduce high blood pressure.   Exercise and Your Heart -Discuss why it is important to exercise, the FITT principles of exercise, normal and abnormal responses to exercise, and how to exercise safely.   Angina -Discuss definition of angina, causes of angina, treatment of angina, and how to decrease risk of having angina.   Cardiac Medications -Review what the following cardiac medications are used for, how they affect the body, and side effects that may occur when taking the medications.  Medications include Aspirin, Beta blockers, calcium channel blockers, ACE Inhibitors, angiotensin receptor blockers, diuretics, digoxin, and antihyperlipidemics. Flowsheet Row CARDIAC REHAB PHASE II EXERCISE from 04/16/2023 in [place] [facility]  Date 03/26/23  Educator DJ  Instruction Review Code 1- Verbalizes Understanding       Congestive Heart Failure -Discuss the  definition of CHF, how to live with CHF, the signs and symptoms of CHF, and how keep track of weight and sodium intake. Flowsheet Row CARDIAC REHAB PHASE II EXERCISE from 04/16/2023 in [place] CARDIAC REHABILITATION  Date 04/02/23  Educator [facility]  Instruction Review Code 1- Verbalizes Understanding       Heart Disease and Intimacy -Discus the effect sexual activity has on the heart, how changes occur during intimacy as we age, and safety during sexual activity. Flowsheet Row CARDIAC REHAB PHASE II EXERCISE from 04/16/2023 in [place] CARDIAC REHABILITATION  Date 04/09/23  Educator [facility]  Instruction Review Code 1- Verbalizes Understanding       Smoking Cessation /  COPD -Discuss different methods to quit smoking, the health benefits of quitting smoking, and the definition of COPD.   Nutrition I: Fats -Discuss the types of cholesterol, what cholesterol does to the heart, and how cholesterol levels can be controlled. Flowsheet Row CARDIAC REHAB PHASE II EXERCISE from 04/16/2023 in [place] CARDIAC REHABILITATION  Date 04/16/23  Educator [facility]  Instruction Review Code 1- Verbalizes Understanding       Nutrition II: Labels -Discuss the different components of food labels and how to read food label Flowsheet Row CARDIAC REHAB PHASE II EXERCISE from 04/16/2023 in [place] CARDIAC REHABILITATION  Date 04/16/23  Educator [facility]  Instruction Review Code 1- Verbalizes Understanding       Heart Parts/Heart Disease and PAD -Discuss the anatomy of the heart, the pathway of blood circulation through the heart, and these are affected by heart disease.   Stress I: Signs and Symptoms -Discuss the causes of stress, how stress may lead to anxiety and depression, and ways to limit stress.   Stress II: Relaxation -Discuss different types of relaxation techniques to limit stress.   Warning Signs of Stroke / TIA -Discuss definition of a stroke, what the signs and symptoms are of a stroke, and how to identify when someone is having stroke.   Knowledge Questionnaire Score:  Knowledge Questionnaire Score - 03/20/23 0854       Knowledge Questionnaire Score   Pre Score 19/24             Core Components/Risk Factors/Patient Goals at Admission:  Personal Goals and Risk Factors at Admission - 03/20/23 0854       Core Components/Risk Factors/Patient Goals on Admission    Weight Management Yes;Weight Loss;Obesity    Intervention Weight Management: Develop a combined nutrition and exercise program designed to reach desired caloric intake, while maintaining appropriate intake of nutrient and fiber, sodium and fats, and appropriate energy expenditure required  for the weight goal.;Weight Management: Provide education and appropriate resources to help participant work on and attain dietary goals.;Weight Management/Obesity: Establish reasonable short term and long term weight goals.;Obesity: Provide education and appropriate resources to help participant work on and attain dietary goals.    Admit Weight 228 lb 14.4 oz (103.8 kg)    Goal Weight: Short Term 223 lb (101.2 kg)    Goal Weight: Long Term 218 lb (98.9 kg)    Expected Outcomes Short Term: Continue to assess and modify interventions until short term weight is achieved;Long Term: Adherence to nutrition and physical activity/exercise program aimed toward attainment of established weight goal;Weight Loss: Understanding of general recommendations for a balanced deficit meal plan, which promotes 1-2 lb weight loss per week and includes a negative energy balance of [phone] kcal/d;Understanding recommendations for meals to include 15-35% energy as protein, 25-35% energy from fat, 35-60%  energy from carbohydrates, less than 200mg  of dietary cholesterol, 20-35 gm of total fiber daily;Understanding of distribution of calorie intake throughout the day with the consumption of 4-5 meals/snacks    Improve shortness of breath with ADL's Yes    Intervention Provide education, individualized exercise plan and daily activity instruction to help decrease symptoms of SOB with activities of daily living.    Expected Outcomes Short Term: Improve cardiorespiratory fitness to achieve a reduction of symptoms when performing ADLs;Long Term: Be able to perform more ADLs without symptoms or delay the onset of symptoms    Diabetes Yes    Intervention Provide education about signs/symptoms and action to take for hypo/hyperglycemia.;Provide education about proper nutrition, including hydration, and aerobic/resistive exercise prescription along with prescribed medications to achieve blood glucose in normal ranges: Fasting glucose 65-99  mg/dL    Expected Outcomes Short Term: Participant verbalizes understanding of the signs/symptoms and immediate care of hyper/hypoglycemia, proper foot care and importance of medication, aerobic/resistive exercise and nutrition plan for blood glucose control.;Long Term: Attainment of HbA1C < 7%.    Heart Failure Yes    Intervention Provide a combined exercise and nutrition program that is supplemented with education, support and counseling about heart failure. Directed toward relieving symptoms such as shortness of breath, decreased exercise tolerance, and extremity edema.    Expected Outcomes Short term: Attendance in program 2-3 days a week with increased exercise capacity. Reported lower sodium intake. Reported increased fruit and vegetable intake. Reports medication compliance.;Short term: Daily weights obtained and reported for increase. Utilizing diuretic protocols set by physician.;Long term: Adoption of self-care skills and reduction of barriers for early signs and symptoms recognition and intervention leading to self-care maintenance.    Hypertension Yes    Intervention Provide education on lifestyle modifcations including regular physical activity/exercise, weight management, moderate sodium restriction and increased consumption of fresh fruit, vegetables, and low fat dairy, alcohol moderation, and smoking cessation.;Monitor prescription use compliance.    Expected Outcomes Short Term: Continued assessment and intervention until BP is < 140/70mm HG in hypertensive participants. < 130/12mm HG in hypertensive participants with diabetes, heart failure or chronic kidney disease.;Long Term: Maintenance of blood pressure at goal levels.    Lipids Yes    Intervention Provide education and support for participant on nutrition & aerobic/resistive exercise along with prescribed medications to achieve LDL 70mg , HDL >40mg .    Expected Outcomes Short Term: Participant states understanding of desired  cholesterol values and is compliant with medications prescribed. Participant is following exercise prescription and nutrition guidelines.;Long Term: Cholesterol controlled with medications as prescribed, with individualized exercise RX and with personalized nutrition plan. Value goals: LDL < 70mg , HDL > 40 mg.             Core Components/Risk Factors/Patient Goals Review:   Goals and Risk Factor Review     Row Name 04/04/23 0828             Core Components/Risk Factors/Patient Goals Review   Personal Goals Review Diabetes;Weight Management/Obesity       Review [name] states that he wants to stay healthy and be ale to live as long as he can for his grandchilderen. He has been watching what he eats to help manage his diabetes. HE eats more in the morning before rehab due to his CBG being low (70s) in the morining when getting to rehab.       Expected Outcomes Short term: continue to exercise and manage diabetes  long term: Continue to attend rehab  Core Components/Risk Factors/Patient Goals at Discharge (Final Review):   Goals and Risk Factor Review - 04/04/23 0828       Core Components/Risk Factors/Patient Goals Review   Personal Goals Review Diabetes;Weight Management/Obesity    Review [name] states that he wants to stay healthy and be ale to live as long as he can for his grandchilderen. He has been watching what he eats to help manage his diabetes. HE eats more in the morning before rehab due to his CBG being low (70s) in the morining when getting to rehab.    Expected Outcomes Short term: continue to exercise and manage diabetes  long term: Continue to attend rehab             ITP Comments:  ITP Comments     Row Name 03/20/23 [phone] 03/24/23 0818 03/28/23 0837 04/16/23 0759     ITP Comments Patient arrived for 1st visit/orientation/education at 0800. Patient was referred to CR by Dr. Izora Ribas due to STEMI/Stent Placement/PTCA. During orientation advised  patient on arrival and appointment times what to wear, what to do before, during and after exercise. Reviewed attendance and class policy.  Pt is scheduled to return Cardiac Rehab on 03/24/23 at 0815. Pt was advised to come to class 15 minutes before class starts.  Discussed RPE/Dpysnea scales. Patient participated in warm up stretches. Patient was able to complete 6 minute walk test.  Telemetry Ventricular paced rhythm. Patient was measured for the equipment. Discussed equipment safety with patient. Took patient pre-anthropometric measurements. Patient finished visit at 0920. . First full day of exercise!  Patient was oriented to gym and equipment including functions, settings, policies, and procedures.  Patient's individual exercise prescription and treatment plan were reviewed.  All starting workloads were established based on the results of the 6 minute walk test done at initial orientation visit.  The plan for exercise progression was also introduced and progression will be customized based on patient's performance and goals. [name] started day with a low blood sugar and needed two OJ to get it above 110.  Once above, he was able to exercise. 30 day review completed. ITP sent to Dr. Dina Rich, Medical Director of Cardiac Rehab. Continue with ITP unless changes are made by physician.             Comments: 30 day review

## 2023-04-18 ENCOUNTER — Encounter (HOSPITAL_COMMUNITY): Payer: Medicare Other

## 2023-04-21 ENCOUNTER — Other Ambulatory Visit (HOSPITAL_COMMUNITY): Payer: Self-pay

## 2023-04-21 ENCOUNTER — Other Ambulatory Visit: Payer: Self-pay | Admitting: Internal Medicine

## 2023-04-21 ENCOUNTER — Encounter (HOSPITAL_COMMUNITY): Payer: Medicare Other

## 2023-04-21 ENCOUNTER — Encounter (HOSPITAL_COMMUNITY)
Admission: RE | Admit: 2023-04-21 | Discharge: 2023-04-21 | Disposition: A | Discharge status: Home / Self Care | Payer: Medicare Other | Source: Ambulatory Visit | Attending: Internal Medicine | Admitting: Internal Medicine

## 2023-04-21 DIAGNOSIS — I213 ST elevation (STEMI) myocardial infarction of unspecified site: Secondary | ICD-10-CM | POA: Diagnosis not present

## 2023-04-21 DIAGNOSIS — Z9861 Coronary angioplasty status: Secondary | ICD-10-CM | POA: Diagnosis not present

## 2023-04-21 DIAGNOSIS — I5020 Unspecified systolic (congestive) heart failure: Secondary | ICD-10-CM | POA: Diagnosis not present

## 2023-04-21 DIAGNOSIS — I252 Old myocardial infarction: Secondary | ICD-10-CM | POA: Diagnosis not present

## 2023-04-21 DIAGNOSIS — Z955 Presence of coronary angioplasty implant and graft: Secondary | ICD-10-CM | POA: Diagnosis not present

## 2023-04-21 DIAGNOSIS — I2510 Atherosclerotic heart disease of native coronary artery without angina pectoris: Secondary | ICD-10-CM | POA: Diagnosis not present

## 2023-04-21 NOTE — Progress Notes (Signed)
Daily Session Note  Patient Details  Name: RAYDAN LAUDERMILCH MRN: [id number] Date of Birth: [birth year] Referring Provider:   Flowsheet Row CARDIAC REHAB PHASE II ORIENTATION from 03/20/2023 in [facility] CARDIAC REHABILITATION  Referring Provider [name] MD       Encounter Date: 04/21/2023  Check In:  Session Check In - 04/21/23 0735       Check-In   Supervising physician immediately available to respond to emergencies See telemetry face sheet for immediately available MD    Location [facility]    Staff Present Fabio Pierce, MA, RCEP, CCRP, Harolyn Rutherford, RN, BSN    Virtual Visit No    Medication changes reported     No    Fall or balance concerns reported    Yes    Comments Patient has history of falls and uses a cane.    Tobacco Cessation No Change    Warm-up and Cool-down Performed on first and last piece of equipment    Resistance Training Performed Yes    VAD Patient? No      Pain Assessment   Currently in Pain? No/denies             Capillary Blood Glucose: No results found for this or any previous visit (from the past 24 hour(s)).    Social History   Tobacco Use  Smoking Status Former   Current packs/day: 0.00   Types: Cigarettes   Quit date: 09/02/1976   Years since quitting: 46.6  Smokeless Tobacco Former   Types: Chew   Quit date: 09/03/2003    Goals Met:  Independence with exercise equipment Exercise tolerated well No report of concerns or symptoms today Strength training completed today  Goals Unmet:  Not Applicable  Comments: Pt able to follow exercise prescription today without complaint.  Will continue to monitor for progression.    Dr. Dina Rich is Medical Director for [facility] Cardiac Rehab

## 2023-04-21 NOTE — Progress Notes (Signed)
Reviewed home exercise with pt today.  Pt plans to walk at home for exercise.  Reviewed THR, pulse, RPE, sign and symptoms, pulse oximetery and when to call 911 or MD.  Also discussed weather considerations and indoor options.  Pt voiced understanding.

## 2023-04-22 ENCOUNTER — Other Ambulatory Visit: Payer: Self-pay

## 2023-04-23 ENCOUNTER — Encounter (HOSPITAL_COMMUNITY): Payer: Medicare Other

## 2023-04-23 ENCOUNTER — Encounter: Payer: Self-pay | Admitting: Nurse Practitioner

## 2023-04-23 ENCOUNTER — Encounter (HOSPITAL_COMMUNITY)
Admission: RE | Admit: 2023-04-23 | Discharge: 2023-04-23 | Disposition: A | Discharge status: Home / Self Care | Payer: Medicare Other | Source: Ambulatory Visit | Attending: Internal Medicine | Admitting: Internal Medicine

## 2023-04-23 ENCOUNTER — Encounter (INDEPENDENT_AMBULATORY_CARE_PROVIDER_SITE_OTHER): Payer: Medicare Other | Admitting: Nurse Practitioner

## 2023-04-23 VITALS — BP 138/66 | HR 70 | Ht 72.0 in | Wt 244.4 lb

## 2023-04-23 DIAGNOSIS — I10 Essential (primary) hypertension: Secondary | ICD-10-CM | POA: Diagnosis not present

## 2023-04-23 DIAGNOSIS — Z9861 Coronary angioplasty status: Secondary | ICD-10-CM | POA: Diagnosis not present

## 2023-04-23 DIAGNOSIS — I213 ST elevation (STEMI) myocardial infarction of unspecified site: Secondary | ICD-10-CM

## 2023-04-23 DIAGNOSIS — Z955 Presence of coronary angioplasty implant and graft: Secondary | ICD-10-CM | POA: Diagnosis not present

## 2023-04-23 DIAGNOSIS — I255 Ischemic cardiomyopathy: Secondary | ICD-10-CM | POA: Diagnosis not present

## 2023-04-23 DIAGNOSIS — I5020 Unspecified systolic (congestive) heart failure: Secondary | ICD-10-CM

## 2023-04-23 DIAGNOSIS — I252 Old myocardial infarction: Secondary | ICD-10-CM

## 2023-04-23 DIAGNOSIS — I2510 Atherosclerotic heart disease of native coronary artery without angina pectoris: Secondary | ICD-10-CM

## 2023-04-23 MED ORDER — LOSARTAN POTASSIUM 25 MG PO TABS: 25.0000 mg | ORAL_TABLET | Freq: Every day | ORAL | 1 refills | Status: AC

## 2023-04-23 MED ORDER — BISOPROLOL FUMARATE 5 MG PO TABS: 5.0000 mg | ORAL_TABLET | Freq: Every day | ORAL | 2 refills | Status: AC

## 2023-04-23 MED ORDER — PRASUGREL HCL 10 MG PO TABS: 10.0000 mg | ORAL_TABLET | Freq: Every day | ORAL | 3 refills | Status: DC

## 2023-04-23 NOTE — Patient Instructions (Addendum)
Medication Instructions:  Your physician recommends that you continue on your current medications as directed. Please refer to the Current Medication list given to you today.  Labwork: None  Testing/Procedures: Your physician has requested that you have an echocardiogram. Echocardiography is a painless test that uses sound waves to create images of your heart. It provides your doctor with information about the size and shape of your heart and how well your heart's chambers and valves are working. This procedure takes approximately one hour. There are no restrictions for this procedure. Please do NOT wear cologne, perfume, aftershave, or lotions (deodorant is allowed). Please arrive 15 minutes prior to your appointment time.  Follow-Up: Your physician recommends that you schedule a follow-up appointment in: 8 weeks with E. Philis Nettle  Any Other Special Instructions Will Be Listed Below (If Applicable).  If you need a refill on your cardiac medications before your next appointment, please call your pharmacy.

## 2023-04-23 NOTE — Progress Notes (Signed)
Daily Session Note  Patient Details  Name: Douglas Mason MRN: [id number] Date of Birth: 1950/11/18 Referring Provider:   Flowsheet Row CARDIAC REHAB PHASE II ORIENTATION from 03/20/2023 in [facility] CARDIAC REHABILITATION  Referring Provider [name] MD       Encounter Date: 04/23/2023  Check In:  Session Check In - 04/23/23 0753       Check-In   Supervising physician immediately available to respond to emergencies See telemetry face sheet for immediately available MD    Physician(s) Dr. Diona Browner    Location [facility]    Staff Present Ross Ludwig, BS, Exercise Physiologist;Jessica Juanetta Gosling, MA, RCEP, CCRP, Dow Adolph, RN, BSN    Virtual Visit No    Medication changes reported     No    Fall or balance concerns reported    Yes    Comments Patient has history of falls and uses a cane.    Tobacco Cessation No Change    Warm-up and Cool-down Performed on first and last piece of equipment    Resistance Training Performed Yes    VAD Patient? No    PAD/SET Patient? No      Pain Assessment   Currently in Pain? No/denies    Pain Score 0-No pain    Multiple Pain Sites No             Capillary Blood Glucose: No results found for this or any previous visit (from the past 24 hour(s)).    Social History   Tobacco Use  Smoking Status Former   Current packs/day: 0.00   Types: Cigarettes   Quit date: 09/02/1976   Years since quitting: 46.6  Smokeless Tobacco Former   Types: Chew   Quit date: 09/03/2003    Goals Met:  Independence with exercise equipment Exercise tolerated well No report of concerns or symptoms today Strength training completed today  Goals Unmet:  Not Applicable  Comments: Pt able to follow exercise prescription today without complaint.  Will continue to monitor for progression.    Dr. Dina Rich is Medical Director for [facility] Cardiac Rehab

## 2023-04-23 NOTE — Progress Notes (Signed)
Cardiology Office Note:  .   Date: 04/23/2023 ID:  Douglas Mason, DOB 1951-03-07, MRN [id number] PCP: Benita Stabile, MD  [facility] Providers Cardiologist:  Christell Constant, MD    History of Present Illness: .   Douglas Mason is a 72 y.o. male with a PMH of CAD, s/p anterior wall MI, HTN, T2DM, OSA, anxiety, and ischemic cardiomyopathy, who presents today for hospital follow-up.   Received DES to LAD mid 02/2023. Was unable to pick up Brilinta. Re-admitted on 6/29 for acute STEMI d/t stent thrombosis. Treated with balloon angioplasty, also underwent balloon angioplasty to first diagonal that was found to be 80% stenosed. Echo revealed EF 30-35%. Life Vest arranged prior to discharge.   Today he presents for follow-up. He states he is doing well. Says he took off his Life Vest. Denies any chest pain, shortness of breath, palpitations, syncope, presyncope, dizziness, orthopnea, PND, swelling or significant weight changes, acute bleeding, or claudication. Currently doing cardiac rehab and enjoying this. Compliant with his medications. Bruising along right forearm and small "knot" along cath site has resolved.   Studies Reviewed: .    Echo 03/02/2023:  1. Left ventricular ejection fraction, by estimation, is 30 to 35%. The  left ventricle has moderately decreased function. The left ventricle  demonstrates regional wall motion abnormalities (see scoring  diagram/findings for description). Left ventricular   diastolic parameters are consistent with Grade I diastolic dysfunction  (impaired relaxation).   2. Right ventricular systolic function is normal. The right ventricular  size is normal. Tricuspid regurgitation signal is inadequate for assessing  PA pressure.   3. The mitral valve is grossly normal. Trivial mitral valve  regurgitation. No evidence of mitral stenosis.   4. The aortic valve is tricuspid. Aortic valve regurgitation is not  visualized. Aortic valve  sclerosis is present, with no evidence of aortic  valve stenosis.   5. There is mild dilatation of the ascending aorta, measuring 43 mm.   6. The inferior vena cava is dilated in size with <50% respiratory  variability, suggesting right atrial pressure of 15 mmHg.   Comparison(s): Changes from prior study are noted. The left ventricular  function is significantly worse.   Conclusion(s)/Recommendation(s): Findings consistent with ischemic  cardiomyopathy.  LHC 03/01/2023:    Lat 1st Mrg lesion is 50% stenosed.   Mid LAD lesion is 100% stenosed.  Balloon angioplasty was performed using a BALL SAPPHIRE NC24 3.25X18.   Post intervention, there is a 0% residual stenosis.   1st Diag lesion is 80% stenosed.  Balloon angioplasty was performed using a BALLN EMERGE MR 2.0X12.   Post intervention, there is a 20% residual stenosis.   There is severe left ventricular systolic dysfunction.   LV end diastolic pressure is moderately elevated.   The left ventricular ejection fraction is less than 25% by visual estimate.   There is no aortic valve stenosis.   In the absence of any other complications or medical issues, we expect the patient to be ready for discharge from an interventional cardiology perspective on 03/04/2023.   Recommend uninterrupted dual antiplatelet therapy with Aspirin 81mg  daily and Prasugrel 10mg  daily for a minimum of 12 months (ACS-Class I recommendation).   Tortuous right subclavian.  Longer guide needed for the left.  EBU 3.75 was sent for.   Successful PTCA of the acute stent thrombosis in the mid LAD stent.  Successful PTCA of the ostial diagonal branch.   It appears that the patient was unable  to afford Brilinta at the time of hospital discharge.  Another prescription has been sent to his home pharmacy.  They did not have the medicine in stock and it was going to take them 30 days to get it according to the patient.  He has been without his Brilinta.  Going forward, he will need  to be discharged on either clopidogrel or prasugrel since both options should be less expensive.  We did load him with Brilinta in the Cath Lab today prior to knowing this history.  Tomorrow, would verify cost and if possible, treat with Effient 60 mg x 1.  After that, he will need Effient 10 mg daily.  He will need aggressive diuresis as well.  40 mg of Lasix IV was given after the case in the Cath Lab.  He will need medical therapy for LV dysfunction as well.   In addition, he had severe right forearm bruising from the prior catheterization.  There is no hematoma but bruising spreading across the medial forearm.  He will be transferred to the CCU.   LHC 02/20/2023:    Lat 1st Mrg lesion is 50% stenosed.   Mid LAD lesion is 85% stenosed.   A stent was successfully placed.   Post intervention, there is a 0% residual stenosis.   1.  High-grade mid LAD lesion treated with OCT guided and optimized PCI with 1 drug-eluting stent. 2.  LVEDP of 19 mmHg.   Recommendation: Overnight observation, dual antiplatelet therapy at least 6 months and preferably 1 year with aggressive cardiovascular risk factor modification.  Echo 02/19/2023:  1. Left ventricular ejection fraction, by estimation, is 55 to 60%. The  left ventricle has normal function. The left ventricle has no regional  wall motion abnormalities. There is mild concentric left ventricular  hypertrophy. Left ventricular diastolic  parameters were normal.   2. Right ventricular systolic function is normal. The right ventricular  size is normal. There is mildly elevated pulmonary artery systolic  pressure. The estimated right ventricular systolic pressure is 39.5 mmHg.   3. The mitral valve is grossly normal. Trivial mitral valve  regurgitation.   4. The aortic valve is tricuspid. Aortic valve regurgitation is not  visualized.   5. The inferior vena cava is normal in size with greater than 50%  respiratory variability, suggesting right atrial  pressure of 3 mmHg.   Comparison(s): Prior images reviewed side by side. LVEF remains normal  range at 55-60%.  Physical Exam:   VS:  BP 138/66 (BP Location: Right Arm, Patient Position: Sitting, Cuff Size: Normal)   Pulse 70   Ht 6' (1.829 m)   Wt 244 lb 6.4 oz (110.9 kg)   SpO2 95%   BMI 33.15 kg/m    Wt Readings from Last 3 Encounters:  04/23/23 244 lb 6.4 oz (110.9 kg)  03/20/23 228 lb 14.4 oz (103.8 kg)  03/07/23 236 lb 6.4 oz (107.2 kg)    GEN: Well nourished, well developed in no acute distress NECK: No JVD; No carotid bruits CARDIAC: S1/S2, RRR, no murmurs, rubs, gallops RESPIRATORY:  Clear to auscultation without rales, wheezing or rhonchi  ABDOMEN: Soft, non-tender, non-distended EXTREMITIES:  No edema, no deformity  ASSESSMENT AND PLAN: .    CAD, s/p STEMI Stable with no anginal symptoms.  Continue Aspirin, atorvastatin, bisoprolol, losartan, NTG PRN, and Effient. Heart healthy diet and regular cardiovascular exercise encouraged. Continue cardiac rehab as scheduled. Will provide refill of Effient, losartan, and bisoprolol.   HFrEF, ICM Stage C,  NYHA class I symptoms. Echo 03/02/2023 revealed EF 30-35%. Euvolemic and well compensated on exam. Continue current GDMT. Did discuss switching losartan to [facility], however patient declines at this time.  He is requesting Jardiance samples, will provide for him at this time. Low sodium diet, fluid restriction <2L, and daily weights encouraged. Educated to contact our office for weight gain of 2 lbs overnight or 5 lbs in one week. Encouraged pt to wear Life Vest for 3 months to to prevent VF/VT and did discuss risks of not wearing Life Vest. He verbalized understanding.  Will update echocardiogram in 5-6 weeks per protocol.   HTN BP stable. Discussed to monitor BP at home at least 2 hours after medications and sitting for 5-10 minutes. No medication changes at this time. Heart healthy diet and regular cardiovascular exercise  encouraged.   Dispo: Follow-up in 8 weeks with me or APP or sooner if anything changes.   Signed, Sharlene Dory, NP

## 2023-04-25 ENCOUNTER — Encounter (HOSPITAL_COMMUNITY)
Admission: RE | Admit: 2023-04-25 | Discharge: 2023-04-25 | Disposition: A | Discharge status: Home / Self Care | Payer: Medicare Other | Source: Ambulatory Visit | Attending: Internal Medicine | Admitting: Internal Medicine

## 2023-04-25 ENCOUNTER — Encounter (HOSPITAL_COMMUNITY): Payer: Medicare Other

## 2023-04-25 DIAGNOSIS — I2510 Atherosclerotic heart disease of native coronary artery without angina pectoris: Secondary | ICD-10-CM | POA: Diagnosis not present

## 2023-04-25 DIAGNOSIS — Z955 Presence of coronary angioplasty implant and graft: Secondary | ICD-10-CM

## 2023-04-25 DIAGNOSIS — I252 Old myocardial infarction: Secondary | ICD-10-CM | POA: Diagnosis not present

## 2023-04-25 DIAGNOSIS — I213 ST elevation (STEMI) myocardial infarction of unspecified site: Secondary | ICD-10-CM

## 2023-04-25 DIAGNOSIS — I5020 Unspecified systolic (congestive) heart failure: Secondary | ICD-10-CM | POA: Diagnosis not present

## 2023-04-25 DIAGNOSIS — Z9861 Coronary angioplasty status: Secondary | ICD-10-CM

## 2023-04-25 NOTE — Progress Notes (Signed)
Daily Session Note  Patient Details  Name: Douglas Mason MRN: [id number] Date of Birth: 10/03/1950 Referring Provider:   Flowsheet Row CARDIAC REHAB PHASE II ORIENTATION from 03/20/2023 in [facility] CARDIAC REHABILITATION  Referring Provider [name] MD       Encounter Date: 04/25/2023  Check In:  Session Check In - 04/25/23 0745       Check-In   Supervising physician immediately available to respond to emergencies See telemetry face sheet for immediately available MD    Physician(s) Dr. Diona Browner    Location [facility]    Staff Present Ross Ludwig, BS, Exercise Physiologist;Jessica Juanetta Gosling, MA, RCEP, CCRP, CCET    Virtual Visit No    Medication changes reported     No    Fall or balance concerns reported    Yes    Comments Patient has history of falls and uses a cane.    Tobacco Cessation No Change    Warm-up and Cool-down Performed on first and last piece of equipment    Resistance Training Performed Yes    VAD Patient? No    PAD/SET Patient? No      Pain Assessment   Currently in Pain? No/denies    Pain Score 0-No pain    Multiple Pain Sites No             Capillary Blood Glucose: No results found for this or any previous visit (from the past 24 hour(s)).    Social History   Tobacco Use  Smoking Status Former   Current packs/day: 0.00   Types: Cigarettes   Quit date: 09/02/1976   Years since quitting: 46.6  Smokeless Tobacco Former   Types: Chew   Quit date: 09/03/2003    Goals Met:  Independence with exercise equipment Exercise tolerated well No report of concerns or symptoms today Strength training completed today  Goals Unmet:  Not Applicable  Comments: Pt able to follow exercise prescription today without complaint.  Will continue to monitor for progression.    Dr. Dina Rich is Medical Director for [facility] Cardiac Rehab

## 2023-04-28 ENCOUNTER — Encounter (HOSPITAL_COMMUNITY): Payer: Medicare Other

## 2023-04-28 ENCOUNTER — Encounter (HOSPITAL_COMMUNITY)
Admission: RE | Admit: 2023-04-28 | Discharge: 2023-04-28 | Disposition: A | Discharge status: Home / Self Care | Payer: Medicare Other | Source: Ambulatory Visit | Attending: Internal Medicine | Admitting: Internal Medicine

## 2023-04-28 DIAGNOSIS — Z9861 Coronary angioplasty status: Secondary | ICD-10-CM | POA: Diagnosis not present

## 2023-04-28 DIAGNOSIS — I252 Old myocardial infarction: Secondary | ICD-10-CM | POA: Diagnosis not present

## 2023-04-28 DIAGNOSIS — I213 ST elevation (STEMI) myocardial infarction of unspecified site: Secondary | ICD-10-CM

## 2023-04-28 DIAGNOSIS — Z955 Presence of coronary angioplasty implant and graft: Secondary | ICD-10-CM | POA: Diagnosis not present

## 2023-04-28 DIAGNOSIS — I5020 Unspecified systolic (congestive) heart failure: Secondary | ICD-10-CM | POA: Diagnosis not present

## 2023-04-28 DIAGNOSIS — I2510 Atherosclerotic heart disease of native coronary artery without angina pectoris: Secondary | ICD-10-CM | POA: Diagnosis not present

## 2023-04-28 NOTE — Progress Notes (Signed)
Daily Session Note  Patient Details  Name: AMADOU BLACKMORE MRN: [id number] Date of Birth: [birth year] Referring Provider:   Flowsheet Row CARDIAC REHAB PHASE II ORIENTATION from 03/20/2023 in [facility] CARDIAC REHABILITATION  Referring Provider [name] MD       Encounter Date: 04/28/2023  Check In:  Session Check In - 04/28/23 0751       Check-In   Supervising physician immediately available to respond to emergencies See telemetry face sheet for immediately available MD    Location [facility]    Staff Present Ross Ludwig, BS, Exercise Physiologist;Ahmadou Bolz Juanetta Gosling, MA, RCEP, CCRP, Dow Adolph, RN, BSN    Virtual Visit No    Medication changes reported     No    Fall or balance concerns reported    No    Warm-up and Cool-down Performed on first and last piece of equipment    Resistance Training Performed Yes    VAD Patient? No    PAD/SET Patient? No      Pain Assessment   Currently in Pain? No/denies             Capillary Blood Glucose: No results found for this or any previous visit (from the past 24 hour(s)).    Social History   Tobacco Use  Smoking Status Former   Current packs/day: 0.00   Types: Cigarettes   Quit date: 09/02/1976   Years since quitting: 46.6  Smokeless Tobacco Former   Types: Chew   Quit date: 09/03/2003    Goals Met:  Independence with exercise equipment Exercise tolerated well No report of concerns or symptoms today Strength training completed today  Goals Unmet:  Not Applicable  Comments: Pt able to follow exercise prescription today without complaint.  Will continue to monitor for progression.    Dr. Dina Rich is Medical Director for [facility] Cardiac Rehab

## 2023-04-30 ENCOUNTER — Other Ambulatory Visit (HOSPITAL_COMMUNITY): Payer: Self-pay

## 2023-04-30 ENCOUNTER — Encounter (HOSPITAL_COMMUNITY): Payer: Medicare Other

## 2023-05-02 ENCOUNTER — Ambulatory Visit: Payer: Self-pay | Admitting: *Deleted

## 2023-05-02 ENCOUNTER — Encounter (HOSPITAL_COMMUNITY): Payer: Medicare Other

## 2023-05-02 ENCOUNTER — Encounter: Payer: Medicare Other | Admitting: *Deleted

## 2023-05-02 ENCOUNTER — Encounter (HOSPITAL_COMMUNITY)
Admission: RE | Admit: 2023-05-02 | Discharge: 2023-05-02 | Disposition: A | Discharge status: Home / Self Care | Payer: Medicare Other | Source: Ambulatory Visit | Attending: Internal Medicine | Admitting: Internal Medicine

## 2023-05-02 DIAGNOSIS — I5020 Unspecified systolic (congestive) heart failure: Secondary | ICD-10-CM | POA: Diagnosis not present

## 2023-05-02 DIAGNOSIS — I2510 Atherosclerotic heart disease of native coronary artery without angina pectoris: Secondary | ICD-10-CM | POA: Diagnosis not present

## 2023-05-02 DIAGNOSIS — Z9861 Coronary angioplasty status: Secondary | ICD-10-CM

## 2023-05-02 DIAGNOSIS — I252 Old myocardial infarction: Secondary | ICD-10-CM | POA: Diagnosis not present

## 2023-05-02 DIAGNOSIS — Z955 Presence of coronary angioplasty implant and graft: Secondary | ICD-10-CM

## 2023-05-02 DIAGNOSIS — I213 ST elevation (STEMI) myocardial infarction of unspecified site: Secondary | ICD-10-CM | POA: Diagnosis not present

## 2023-05-02 NOTE — Patient Outreach (Addendum)
  Care Coordination   Follow Up Visit Note   12/01/2023 Name: Douglas Mason MRN: [id number] DOB: 18-Jul-1951  Douglas Mason is a 72 y.o. year old male who sees Douglas Mason, Douglas Hazel, MD for primary care. I spoke with  Douglas Mason by phone today.  What matters to the patients health and wellness today?  confirmed he is attending and starting cardiac rehab today at the time of this outreach and getting stronger   Attending every day or every other day until 06/23/23 0745   Goals Addressed             This Visit's Progress    Home management of Cardiac and diabetice conditions _ [facility] care coordination services   On track    Interventions Today    Flowsheet Row Most Recent Value  Chronic Disease   Chronic disease during today's visit Diabetes, Hypertension (HTN), Other  [cardiac rehab, 2 MIs, hypoglycemia hypotension]  General Interventions   General Interventions Discussed/Reviewed General Interventions Discussed, Durable Medical Equipment (DME), [employer], Doctor Visits  [reviewed importance of monitoring cbg & BP at home, [facility] [employer], [facility], cardiac rehab services,]  Doctor Visits Discussed/Reviewed Doctor Visits Discussed, PCP  Durable Medical Equipment (DME) BP Cuff, Glucomoter, Other  [encouraged him to heck his blood pressure more thanhe does confirmed he uses his cane for safety]  PCP/Specialist Visits Compliance with follow-up visit  Exercise Interventions   Exercise Discussed/Reviewed Exercise Discussed, Physical Activity, Weight Managment, Assistive device use and maintanence  Physical Activity Discussed/Reviewed Physical Activity Discussed, Types of exercise, Gym, PREP  [discussed cardic rehab, [employer] exercise program, home exercise He confirms he walks up and down his steps between floors]  Weight Management Weight loss  [discussed the maintenance of Diabetes and heart condition will require a change in lifestyle, weight management]  Education Interventions    Education Provided Provided Therapist, sports, Provided Education  Provided Verbal Education On Nutrition, Blood Sugar Monitoring, Mental Health/Coping with Illness, Exercise, Medication, When to see the doctor, [employer], [employer]  [food label reading, eating in moderation variety of foods, THNSW available for loss of interest, pharmacy available when not able to get insulin, Insurance benefits]  Mental Health Interventions   Mental Health Discussed/Reviewed Mental Health Discussed, Coping Strategies  [discused his loss of interest and his intervention to improve this with the expectation of a new great grand child soon Offered [facility] SW referral but does not prefer]  Nutrition Interventions   Nutrition Discussed/Reviewed Nutrition Discussed, Fluid intake, Decreasing sugar intake, Decreasing fats, Increasing proteins, Carbohydrate meal planning, Decreasing salt  Pharmacy Interventions   Pharmacy Dicussed/Reviewed Pharmacy Topics Discussed, Medications and their functions, Affording Medications  Safety Interventions   Safety Discussed/Reviewed Safety Discussed, Fall Risk              SDOH assessments and interventions completed:  No     Care Coordination Interventions:  Yes, provided   Follow up plan: Follow up call scheduled for 12/31/23    Encounter Outcome:  Pt. Visit Completed   Douglas Mason L. Noelle Penner, RN, BSN, CCM, Care Management Coordinator [phone]

## 2023-05-02 NOTE — Patient Instructions (Addendum)
 Visit Information  Thank you for taking time to visit with me today. Please don't hesitate to contact me if I can be of assistance to you.   Following are the goals we discussed today:   Goals Addressed             This Visit's Progress    Home management of Cardiac and diabetice conditions _ [facility] care coordination services   On track    Interventions Today    Flowsheet Row Most Recent Value  Chronic Disease   Chronic disease during today's visit --  [follow up cardiac rehab]  General Interventions   General Interventions Discussed/Reviewed General Interventions Reviewed, Doctor Visits, Community Resources  Doctor Visits Discussed/Reviewed Doctor Visits Reviewed, Specialist  PCP/Specialist Visits Compliance with follow-up visit  Exercise Interventions   Exercise Discussed/Reviewed Exercise Reviewed, Physical Activity  Physical Activity Discussed/Reviewed Physical Activity Reviewed, PREP  [confirmed he is attending and starting cardiac rehab and getting stronger]              Our next appointment is by telephone on 12/31/23 at 3 pm  Please call the care guide team at [phone] if you need to cancel or reschedule your appointment.   If you are experiencing a Mental Health or Behavioral Health Crisis or need someone to talk to, please call the Suicide and Crisis Lifeline: 988 call the [place] National Suicide Prevention Lifeline: 808-687-4393 or TTY: (646)823-2308 TTY (402)240-0274) to talk to a trained counselor call 1-800-273-TALK (toll free, 24 hour hotline) call the [facility]: 801 746 9075 call 911   No computer access, no preference for copy of AVS      The patient has been provided with contact information for the care management team and has been advised to call with any health related questions or concerns.   Alhassan Everingham L. Noelle Penner, RN, BSN, CCM, Care Management Coordinator [phone]

## 2023-05-02 NOTE — Progress Notes (Signed)
Daily Session Note  Patient Details  Name: Douglas Mason MRN: [id number] Date of Birth: 11-Mar-1951 Referring Provider:   Flowsheet Row CARDIAC REHAB PHASE II ORIENTATION from 03/20/2023 in [facility] CARDIAC REHABILITATION  Referring Provider [name] MD       Encounter Date: 05/02/2023  Check In:  Session Check In - 05/02/23 0830       Check-In   Supervising physician immediately available to respond to emergencies See telemetry face sheet for immediately available MD    Location [facility]    Staff Present Ross Ludwig, BS, Exercise Physiologist;Daphyne Daphine Deutscher, RN, BSN    Virtual Visit No    Medication changes reported     No    Fall or balance concerns reported    No    Warm-up and Cool-down Performed on first and last piece of equipment    Resistance Training Performed Yes    VAD Patient? No    PAD/SET Patient? No      Pain Assessment   Currently in Pain? No/denies             Capillary Blood Glucose: No results found for this or any previous visit (from the past 24 hour(s)).    Social History   Tobacco Use  Smoking Status Former   Current packs/day: 0.00   Types: Cigarettes   Quit date: 09/02/1976   Years since quitting: 46.6  Smokeless Tobacco Former   Types: Chew   Quit date: 09/03/2003    Goals Met:  Independence with exercise equipment Exercise tolerated well No report of concerns or symptoms today Strength training completed today  Goals Unmet:  Not Applicable  Comments: Pt able to follow exercise prescription today without complaint.  Will continue to monitor for progression.    Dr. Dina Rich is Medical Director for [facility] Cardiac Rehab

## 2023-05-05 ENCOUNTER — Encounter (HOSPITAL_COMMUNITY): Payer: Medicare Other

## 2023-05-07 ENCOUNTER — Encounter (HOSPITAL_COMMUNITY): Payer: Medicare Other

## 2023-05-07 ENCOUNTER — Encounter (HOSPITAL_COMMUNITY)
Admission: RE | Admit: 2023-05-07 | Discharge: 2023-05-07 | Disposition: A | Discharge status: Home / Self Care | Payer: Medicare Other | Source: Ambulatory Visit | Attending: Internal Medicine | Admitting: Internal Medicine

## 2023-05-07 DIAGNOSIS — Z9861 Coronary angioplasty status: Secondary | ICD-10-CM | POA: Diagnosis not present

## 2023-05-07 DIAGNOSIS — Z955 Presence of coronary angioplasty implant and graft: Secondary | ICD-10-CM | POA: Diagnosis not present

## 2023-05-07 DIAGNOSIS — I213 ST elevation (STEMI) myocardial infarction of unspecified site: Secondary | ICD-10-CM | POA: Diagnosis not present

## 2023-05-07 NOTE — Progress Notes (Signed)
Daily Session Note  Patient Details  Name: Douglas Mason MRN: [id number] Date of Birth: 1950/12/24 Referring Provider:   Flowsheet Row CARDIAC REHAB PHASE II ORIENTATION from 03/20/2023 in [facility] CARDIAC REHABILITATION  Referring Provider [name] MD       Encounter Date: 05/07/2023  Check In:  Session Check In - 05/07/23 0745       Check-In   Supervising physician immediately available to respond to emergencies See telemetry face sheet for immediately available MD    Location [facility]    Staff Present Ross Ludwig, BS, Exercise Physiologist;Yasuo Phimmasone Daphine Deutscher, RN, BSN    Virtual Visit No    Medication changes reported     No    Fall or balance concerns reported    No    Tobacco Cessation No Change    Warm-up and Cool-down Performed on first and last piece of equipment    Resistance Training Performed Yes    VAD Patient? No      Pain Assessment   Currently in Pain? No/denies             Capillary Blood Glucose: No results found for this or any previous visit (from the past 24 hour(s)).    Social History   Tobacco Use  Smoking Status Former   Current packs/day: 0.00   Types: Cigarettes   Quit date: 09/02/1976   Years since quitting: 46.7  Smokeless Tobacco Former   Types: Chew   Quit date: 09/03/2003    Goals Met:  Independence with exercise equipment Exercise tolerated well No report of concerns or symptoms today Strength training completed today  Goals Unmet:  Not Applicable  Comments: Pt able to follow exercise prescription today without complaint.  Will continue to monitor for progression.    Dr. Dina Rich is Medical Director for [facility] Cardiac Rehab

## 2023-05-07 NOTE — Telephone Encounter (Signed)
Pt aware of his Rx being sent in

## 2023-05-09 ENCOUNTER — Encounter (HOSPITAL_COMMUNITY)
Admission: RE | Admit: 2023-05-09 | Discharge: 2023-05-09 | Disposition: A | Discharge status: Home / Self Care | Payer: Medicare Other | Source: Ambulatory Visit | Attending: Internal Medicine | Admitting: Internal Medicine

## 2023-05-09 ENCOUNTER — Encounter (HOSPITAL_COMMUNITY): Payer: Medicare Other

## 2023-05-09 DIAGNOSIS — I213 ST elevation (STEMI) myocardial infarction of unspecified site: Secondary | ICD-10-CM | POA: Diagnosis not present

## 2023-05-09 DIAGNOSIS — Z955 Presence of coronary angioplasty implant and graft: Secondary | ICD-10-CM

## 2023-05-09 DIAGNOSIS — Z9861 Coronary angioplasty status: Secondary | ICD-10-CM

## 2023-05-09 NOTE — Progress Notes (Signed)
Daily Session Note  Patient Details  Name: Douglas Mason MRN: [id number] Date of Birth: 26-Oct-1950 Referring Provider:   Flowsheet Row CARDIAC REHAB PHASE II ORIENTATION from 03/20/2023 in [facility] CARDIAC REHABILITATION  Referring Provider [name] MD       Encounter Date: 05/09/2023  Check In:  Session Check In - 05/09/23 0745       Check-In   Supervising physician immediately available to respond to emergencies See telemetry face sheet for immediately available MD    Location [facility]    Staff Present Spero Gunnels Daphine Deutscher, RN, BSN;Jessica Hawkins, MA, RCEP, CCRP, CCET    Virtual Visit No    Medication changes reported     No    Fall or balance concerns reported    Yes    Comments Patient has history of falls and uses a cane.    Tobacco Cessation No Change    Warm-up and Cool-down Performed on first and last piece of equipment    Resistance Training Performed Yes    PAD/SET Patient? No             Capillary Blood Glucose: No results found for this or any previous visit (from the past 24 hour(s)).    Social History   Tobacco Use  Smoking Status Former   Current packs/day: 0.00   Types: Cigarettes   Quit date: 09/02/1976   Years since quitting: 46.7  Smokeless Tobacco Former   Types: Chew   Quit date: 09/03/2003    Goals Met:  Independence with exercise equipment Exercise tolerated well No report of concerns or symptoms today Strength training completed today  Goals Unmet:  Not Applicable  Comments: Pt able to follow exercise prescription today without complaint.  Will continue to monitor for progression.    Dr. Dina Rich is Medical Director for [facility] Cardiac Rehab

## 2023-05-12 ENCOUNTER — Encounter (HOSPITAL_COMMUNITY): Payer: Medicare Other

## 2023-05-12 ENCOUNTER — Other Ambulatory Visit: Payer: Medicare Other

## 2023-05-12 ENCOUNTER — Encounter (HOSPITAL_COMMUNITY)
Admission: RE | Admit: 2023-05-12 | Discharge: 2023-05-12 | Disposition: A | Discharge status: Home / Self Care | Payer: Medicare Other | Source: Ambulatory Visit | Attending: Internal Medicine | Admitting: Internal Medicine

## 2023-05-12 DIAGNOSIS — I213 ST elevation (STEMI) myocardial infarction of unspecified site: Secondary | ICD-10-CM

## 2023-05-12 DIAGNOSIS — Z9861 Coronary angioplasty status: Secondary | ICD-10-CM

## 2023-05-12 DIAGNOSIS — Z955 Presence of coronary angioplasty implant and graft: Secondary | ICD-10-CM

## 2023-05-12 NOTE — Progress Notes (Signed)
Daily Session Note  Patient Details  Name: Douglas Mason MRN: [id number] Date of Birth: June 06, 1951 Referring Provider:   Flowsheet Row CARDIAC REHAB PHASE II ORIENTATION from 03/20/2023 in [facility] CARDIAC REHABILITATION  Referring Provider [name] MD       Encounter Date: 05/12/2023  Check In:  Session Check In - 05/12/23 0758       Check-In   Supervising physician immediately available to respond to emergencies See telemetry face sheet for immediately available MD    Physician(s) Dr. Wyline Mood    Location [facility]    Staff Present Erskine Speed, RN;Heather Fredric Mare, BS, Exercise Physiologist;Jessica Bethany, MA, RCEP, CCRP, Dow Adolph, RN, BSN    Virtual Visit No    Medication changes reported     No    Fall or balance concerns reported    Yes    Comments Patient has history of falls and uses a cane.    Tobacco Cessation No Change    Warm-up and Cool-down Performed on first and last piece of equipment    Resistance Training Performed Yes    VAD Patient? No    PAD/SET Patient? No      Pain Assessment   Currently in Pain? No/denies    Pain Score 0-No pain    Multiple Pain Sites No             Capillary Blood Glucose: No results found for this or any previous visit (from the past 24 hour(s)).    Social History   Tobacco Use  Smoking Status Former   Current packs/day: 0.00   Types: Cigarettes   Quit date: 09/02/1976   Years since quitting: 46.7  Smokeless Tobacco Former   Types: Chew   Quit date: 09/03/2003    Goals Met:  Independence with exercise equipment Exercise tolerated well No report of concerns or symptoms today  Goals Unmet:  Not Applicable  Comments: Pt able to follow exercise prescription today without complaint.  Will continue to monitor for progression.    Dr. Dina Rich is Medical Director for [facility] Cardiac Rehab

## 2023-05-14 ENCOUNTER — Encounter (HOSPITAL_COMMUNITY)
Admission: RE | Admit: 2023-05-14 | Discharge: 2023-05-14 | Disposition: A | Discharge status: Home / Self Care | Payer: Medicare Other | Source: Ambulatory Visit | Attending: Internal Medicine | Admitting: Internal Medicine

## 2023-05-14 ENCOUNTER — Encounter (HOSPITAL_COMMUNITY): Payer: Medicare Other

## 2023-05-14 ENCOUNTER — Encounter (HOSPITAL_COMMUNITY): Payer: Self-pay | Admitting: *Deleted

## 2023-05-14 DIAGNOSIS — Z9861 Coronary angioplasty status: Secondary | ICD-10-CM

## 2023-05-14 DIAGNOSIS — Z955 Presence of coronary angioplasty implant and graft: Secondary | ICD-10-CM

## 2023-05-14 DIAGNOSIS — I213 ST elevation (STEMI) myocardial infarction of unspecified site: Secondary | ICD-10-CM | POA: Diagnosis not present

## 2023-05-14 NOTE — Progress Notes (Signed)
Daily Session Note  Patient Details  Name: Douglas Mason MRN: [id number] Date of Birth: Aug 11, 1951 Referring Provider:   Flowsheet Row CARDIAC REHAB PHASE II ORIENTATION from 03/20/2023 in [facility] CARDIAC REHABILITATION  Referring Provider [name] MD       Encounter Date: 05/14/2023  Check In:  Session Check In - 05/14/23 0745       Check-In   Supervising physician immediately available to respond to emergencies [facility] MD immediately available    Physician(s) Dr. Diona Browner    Location [facility]    Staff Present Avanell Shackleton BSN, RN;Daphyne Daphine Deutscher, RN, BSN;Heather Fredric Mare, BS, Exercise Physiologist    Virtual Visit No    Medication changes reported     No    Fall or balance concerns reported    Yes    Comments Patient has history of falls and uses a cane.    Tobacco Cessation No Change    Warm-up and Cool-down Performed on first and last piece of equipment    Resistance Training Performed Yes    VAD Patient? No    PAD/SET Patient? No      Pain Assessment   Currently in Pain? Yes    Pain Score 0-No pain    Multiple Pain Sites No             Capillary Blood Glucose: No results found for this or any previous visit (from the past 24 hour(s)).    Social History   Tobacco Use  Smoking Status Former   Current packs/day: 0.00   Types: Cigarettes   Quit date: 09/02/1976   Years since quitting: 46.7  Smokeless Tobacco Former   Types: Chew   Quit date: 09/03/2003    Goals Met:  Independence with exercise equipment Exercise tolerated well No report of concerns or symptoms today Strength training completed today  Goals Unmet:  Not Applicable  Comments: Marland KitchenMarland KitchenPt able to follow exercise prescription today without complaint.  Will continue to monitor for progression.    Dr. Dina Rich is Medical Director for [facility] Cardiac Rehab

## 2023-05-14 NOTE — Progress Notes (Signed)
Cardiac Individual Treatment Plan  Patient Details  Name: Douglas Mason MRN: [id number] Date of Birth: [birth year] Referring Provider:   Flowsheet Row CARDIAC REHAB PHASE II ORIENTATION from 03/20/2023 in [facility]  Referring Provider [name] MD       Initial Encounter Date:  Flowsheet Row CARDIAC REHAB PHASE II ORIENTATION from 03/20/2023 in [place] [facility]  Date 03/20/23       Visit Diagnosis: ST elevation myocardial infarction (STEMI), unspecified artery (HCC)  Status post coronary artery stent placement  Postsurgical percutaneous transluminal coronary angioplasty (PTCA) status  Patient's Home Medications on Admission:  Current Outpatient Medications:    albuterol (PROVENTIL HFA;VENTOLIN HFA) 108 (90 BASE) MCG/ACT inhaler, Inhale 2 puffs into the lungs every 4 (four) hours as needed for wheezing or shortness of breath., Disp: 1 Inhaler, Rfl: 1   albuterol (PROVENTIL) (2.5 MG/3ML) 0.083% nebulizer solution, Take 3 mLs (2.5 mg total) by nebulization every 6 (six) hours as needed for wheezing or shortness of breath., Disp: 75 mL, Rfl: 12   ALPRAZolam (XANAX) 0.5 MG tablet, Take 0.5 mg by mouth at bedtime as needed for anxiety., Disp: , Rfl:    aspirin EC 81 MG tablet, Take 1 tablet (81 mg total) by mouth daily., Disp: 120 tablet, Rfl: 0   atorvastatin (LIPITOR) 80 MG tablet, Take 1 tablet (80 mg total) by mouth daily., Disp: 30 tablet, Rfl: 11   azaTHIOprine (IMURAN) 50 MG tablet, take 1 tablet by mouth once daily, Disp: 30 tablet, Rfl: 11   bisoprolol (ZEBETA) 5 MG tablet, Take 1 tablet (5 mg total) by mouth daily., Disp: 90 tablet, Rfl: 2   empagliflozin (JARDIANCE) 25 MG TABS tablet, Take 1 tablet (25 mg total) by mouth daily., Disp: 30 tablet, Rfl: 11   glipiZIDE (GLUCOTROL XL) 10 MG 24 hr tablet, Take 1 tablet by mouth 2 (two) times a day., Disp: , Rfl:    HYDROcodone-acetaminophen (NORCO/VICODIN) 5-325 MG tablet, Take  1 tablet by mouth at bedtime as needed for pain., Disp: , Rfl:    isosorbide mononitrate (IMDUR) 30 MG 24 hr tablet, Take 1 tablet (30 mg total) by mouth daily., Disp: 90 tablet, Rfl: 3   losartan (COZAAR) 25 MG tablet, Take 1 tablet (25 mg total) by mouth daily., Disp: 30 tablet, Rfl: 1   Magnesium 400 MG TABS, Take 1 tablet by mouth daily., Disp: , Rfl:    metFORMIN (GLUCOPHAGE-XR) 500 MG 24 hr tablet, Take 2 tablets (1,000 mg total) by mouth 2 (two) times a day., Disp: , Rfl:    nitroGLYCERIN (NITROSTAT) 0.4 MG SL tablet, Place 1 tablet (0.4 mg total) under the tongue every 5 (five) minutes as needed for chest pain., Disp: 25 tablet, Rfl: 1   prasugrel (EFFIENT) 10 MG TABS tablet, Take 1 tablet (10 mg total) by mouth daily., Disp: 90 tablet, Rfl: 3   spironolactone (ALDACTONE) 25 MG tablet, Take 0.5 tablets (12.5 mg total) by mouth daily., Disp: 45 tablet, Rfl: 3   TOUJEO SOLOSTAR 300 UNIT/ML Solostar Pen, Inject 12 Units into the skin daily., Disp: , Rfl:   Past Medical History: Past Medical History:  Diagnosis Date   Bronchitis    Diabetes mellitus without complication (HCC)    GERD (gastroesophageal reflux disease)    High cholesterol    Hypertension    Pleurisy    Pneumonia    Sleep apnea    nurse in pre-op observed pt sats drop to 80-82% while sleeping on RA,wake up would  go to 87%    Tobacco Use: Social History   Tobacco Use  Smoking Status Former   Current packs/day: 0.00   Types: Cigarettes   Quit date: 09/02/1976   Years since quitting: 46.7  Smokeless Tobacco Former   Types: Chew   Quit date: 09/03/2003    Labs: Review Flowsheet       Latest Ref Rng & Units 03/13/2014 02/19/2023 03/01/2023  Labs for ITP Cardiac and Pulmonary Rehab  Cholestrol 0 - 200 mg/dL 161  096  -  LDL (calc) 0 - 99 mg/dL 74  67  -  HDL-C >04 mg/dL 34  27  -  Trlycerides <150 mg/dL 68  540  -  Hemoglobin A1c 4.8 - 5.6 % 5.5  6.0  -  TCO2 22 - 32 mmol/L - - 23     Details             Capillary Blood Glucose: Lab Results  Component Value Date   GLUCAP 131 (H) 04/02/2023   GLUCAP 148 (H) 04/02/2023   GLUCAP 157 (H) 03/28/2023   GLUCAP 139 (H) 03/28/2023   GLUCAP 99 03/28/2023     Exercise Target Goals: Exercise Program Goal: Individual exercise prescription set using results from initial 6 min walk test and THRR while considering  patient's activity barriers and safety.   Exercise Prescription Goal: Starting with aerobic activity 30 plus minutes a day, 3 days per week for initial exercise prescription. Provide home exercise prescription and guidelines that participant acknowledges understanding prior to discharge.  Activity Barriers & Risk Stratification:  Activity Barriers & Cardiac Risk Stratification - 03/20/23 0820       Activity Barriers & Cardiac Risk Stratification   Activity Barriers Arthritis;Shortness of Breath;Deconditioning;Decreased Ventricular Function;History of Falls;Assistive Device;Balance Concerns;Muscular Weakness    Cardiac Risk Stratification High             6 Minute Walk:  6 Minute Walk     Row Name 03/20/23 0945         6 Minute Walk   Phase Initial     Distance 1348 feet     Walk Time 6 minutes     # of Rest Breaks 0     MPH 2.55     METS 2.67     RPE 11     Perceived Dyspnea  2     VO2 Peak 9.33     Symptoms Yes (comment)     Comments SOB     Resting HR 71 bpm     Resting BP 104/58     Resting Oxygen Saturation  96 %     Exercise Oxygen Saturation  during 6 min walk 93 %     Max Ex. HR 105 bpm     Max Ex. BP 134/72     2 Minute Post BP 128/62              Oxygen Initial Assessment:   Oxygen Re-Evaluation:   Oxygen Discharge (Final Oxygen Re-Evaluation):   Initial Exercise Prescription:  Initial Exercise Prescription - 03/20/23 0900       Date of Initial Exercise RX and Referring Provider   Date 03/20/23    Referring Provider [name] MD      Oxygen   Maintain Oxygen  Saturation 88% or higher      Treadmill   MPH 2    Grade 0.5    Minutes 15    METs 2.59      NuStep  Level 3    SPM 80    Minutes 15    METs 2.5      Prescription Details   Frequency (times per week) 3    Duration Progress to 30 minutes of continuous aerobic without signs/symptoms of physical distress      Intensity   THRR 40-80% of Max Heartrate 101-132    Ratings of Perceived Exertion 11-13    Perceived Dyspnea 0-4      Progression   Progression Continue to progress workloads to maintain intensity without signs/symptoms of physical distress.      Resistance Training   Training Prescription Yes    Weight 4 lb    Reps 10-15             Perform Capillary Blood Glucose checks as needed.  Exercise Prescription Changes:   Exercise Prescription Changes     Row Name 03/20/23 0900 03/28/23 1200 04/16/23 1300 04/21/23 0800 05/12/23 1300     Response to Exercise   Blood Pressure (Admit) 104/58 116/64 114/60 -- 98/48   Blood Pressure (Exercise) 134/72 126/64 124/64 -- --   Blood Pressure (Exit) 128/62 100/50 126/64 -- 100/52   Heart Rate (Admit) 71 bpm 66 bpm 75 bpm -- 60 bpm   Heart Rate (Exercise) 105 bpm 94 bpm 106 bpm -- 98 bpm   Heart Rate (Exit) 77 bpm 60 bpm 64 bpm -- 72 bpm   Oxygen Saturation (Admit) 96 % -- -- -- --   Oxygen Saturation (Exercise) 93 % -- -- -- --   Rating of Perceived Exertion (Exercise) 11 12 11  -- 13   Perceived Dyspnea (Exercise) 2 -- -- -- --   Symptoms SOB -- -- -- --   Comments walk test results -- -- -- --   Duration -- Continue with 30 min of aerobic exercise without signs/symptoms of physical distress. Continue with 30 min of aerobic exercise without signs/symptoms of physical distress. -- Continue with 30 min of aerobic exercise without signs/symptoms of physical distress.   Intensity -- THRR unchanged THRR unchanged -- THRR unchanged     Progression   Progression -- Continue to progress workloads to maintain intensity without  signs/symptoms of physical distress. Continue to progress workloads to maintain intensity without signs/symptoms of physical distress. -- Continue to progress workloads to maintain intensity without signs/symptoms of physical distress.     Resistance Training   Training Prescription -- Yes Yes -- Yes   Weight -- 4 4 -- 4   Reps -- 10-15 10-15 -- 10-15     Treadmill   MPH -- 2 2 -- 2   Grade -- 0.5 0.5 -- 0.5   Minutes -- 15 15 -- 15   METs -- 2.67 2.67 -- 2.67     NuStep   Level -- 3 3 -- 4   SPM -- 84 72 -- 77   Minutes -- 15 15 -- 15   METs -- 2.07 1.9 -- 2.2     Home Exercise Plan   Plans to continue exercise at -- -- -- Home (comment)  walking Home (comment)   Frequency -- -- -- Add 2 additional days to program exercise sessions. Add 2 additional days to program exercise sessions.   Initial Home Exercises Provided -- -- -- 04/21/23 --     Oxygen   Maintain Oxygen Saturation -- 88% or higher 88% or higher -- 88% or higher            Exercise Comments:  Exercise Comments     Row Name 03/24/23 0818 04/16/23 1327         Exercise Comments First full day of exercise!  Patient was oriented to gym and equipment including functions, settings, policies, and procedures.  Patient's individual exercise prescription and treatment plan were reviewed.  All starting workloads were established based on the results of the 6 minute walk test done at initial orientation visit.  The plan for exercise progression was also introduced and progression will be customized based on patient's performance and goals. Douglas Mason has               Exercise Goals and Review:   Exercise Goals     Row Name 03/20/23 1006             Exercise Goals   Increase Physical Activity Yes       Intervention Provide advice, education, support and counseling about physical activity/exercise needs.;Develop an individualized exercise prescription for aerobic and resistive training based on initial  evaluation findings, risk stratification, comorbidities and participant's personal goals.       Expected Outcomes Short Term: Attend rehab on a regular basis to increase amount of physical activity.;Long Term: Exercising regularly at least 3-5 days a week.;Long Term: Add in home exercise to make exercise part of routine and to increase amount of physical activity.       Increase Strength and Stamina Yes       Intervention Provide advice, education, support and counseling about physical activity/exercise needs.;Develop an individualized exercise prescription for aerobic and resistive training based on initial evaluation findings, risk stratification, comorbidities and participant's personal goals.       Expected Outcomes Short Term: Increase workloads from initial exercise prescription for resistance, speed, and METs.;Short Term: Perform resistance training exercises routinely during rehab and add in resistance training at home;Long Term: Improve cardiorespiratory fitness, muscular endurance and strength as measured by increased METs and functional capacity ( )       Able to understand and use rate of perceived exertion (RPE) scale Yes       Intervention Provide education and explanation on how to use RPE scale       Expected Outcomes Short Term: Able to use RPE daily in rehab to express subjective intensity level;Long Term:  Able to use RPE to guide intensity level when exercising independently       Able to understand and use Dyspnea scale Yes       Intervention Provide education and explanation on how to use Dyspnea scale       Expected Outcomes Short Term: Able to use Dyspnea scale daily in rehab to express subjective sense of shortness of breath during exertion;Long Term: Able to use Dyspnea scale to guide intensity level when exercising independently       Knowledge and understanding of Target Heart Rate Range (THRR) Yes       Intervention Provide education and explanation of THRR including how  the numbers were predicted and where they are located for reference       Expected Outcomes Short Term: Able to state/look up THRR;Short Term: Able to use daily as guideline for intensity in rehab;Long Term: Able to use THRR to govern intensity when exercising independently       Able to check pulse independently Yes       Intervention Provide education and demonstration on how to check pulse in carotid and radial arteries.;Review the importance of being able to check your own pulse for  safety during independent exercise       Expected Outcomes Short Term: Able to explain why pulse checking is important during independent exercise;Long Term: Able to check pulse independently and accurately       Understanding of Exercise Prescription Yes       Intervention Provide education, explanation, and written materials on patient's individual exercise prescription       Expected Outcomes Short Term: Able to explain program exercise prescription;Long Term: Able to explain home exercise prescription to exercise independently                Exercise Goals Re-Evaluation :  Exercise Goals Re-Evaluation     Row Name 03/20/23 1006 04/01/23 1021 04/04/23 0812 04/16/23 1329 04/21/23 0807     Exercise Goal Re-Evaluation   Exercise Goals Review Able to understand and use Dyspnea scale;Understanding of Exercise Prescription;Able to understand and use rate of perceived exertion (RPE) scale -- Increase Physical Activity;Increase Strength and Stamina;Understanding of Exercise Prescription Increase Physical Activity;Increase Strength and Stamina;Understanding of Exercise Prescription Increase Physical Activity;Increase Strength and Stamina;Able to understand and use rate of perceived exertion (RPE) scale;Able to understand and use Dyspnea scale;Knowledge and understanding of Target Heart Rate Range (THRR);Able to check pulse independently;Understanding of Exercise Prescription   Comments Reviewed RPE and dyspnea scale  and program prescription with pt today. Pt voiced understanding and was given a copy of goals to take home. Pt has not increased his workloads yet in classs but is working at 12 on the RPE scale. He is still fairly new to the program and should start to increase workloads soon Douglas Mason states that he still gets short winded when going up stairs. Exercising has been helping build enduracne and he has noticed more strength in his legs since starting the program. Douglas Mason walks around his circle driveway most days during the evening due to heat. Douglas Mason has not increased his workload in the past two weeks he is still exercising at level 3 on the stepper with an RPE of 11 and his speed is still at 2 on the treadmill with an RPE 11. Will continue to monitor and progress as able. Douglas Mason is doing well in rehab and feeling better overall.  Reviewed home exercise with pt today.  Pt plans to walk at home for exercise.  Reviewed THR, pulse, RPE, sign and symptoms, pulse oximetery and when to call 911 or MD.  Also discussed weather considerations and indoor options.  Pt voiced understanding.   Expected Outcomes Short: Use RPE daily to regulate intensity.  Long: Follow program prescription -- Short term: go over home exericse   long term: continue to exercise and attened rehab Short term goal: increase workload on the stepper and treadmill     long term goal: continue to attended cardiac rehab Short: Continue to walk more at home Long: Continue to improve stamina             Discharge Exercise Prescription (Final Exercise Prescription Changes):  Exercise Prescription Changes - 05/12/23 1300       Response to Exercise   Blood Pressure (Admit) 98/48    Blood Pressure (Exit) 100/52    Heart Rate (Admit) 60 bpm    Heart Rate (Exercise) 98 bpm    Heart Rate (Exit) 72 bpm    Rating of Perceived Exertion (Exercise) 13    Duration Continue with 30 min of aerobic exercise without signs/symptoms of physical distress.    Intensity  THRR unchanged  Progression   Progression Continue to progress workloads to maintain intensity without signs/symptoms of physical distress.      Resistance Training   Training Prescription Yes    Weight 4    Reps 10-15      Treadmill   MPH 2    Grade 0.5    Minutes 15    METs 2.67      NuStep   Level 4    SPM 77    Minutes 15    METs 2.2      Home Exercise Plan   Plans to continue exercise at Home (comment)    Frequency Add 2 additional days to program exercise sessions.      Oxygen   Maintain Oxygen Saturation 88% or higher             Nutrition:  Target Goals: Understanding of nutrition guidelines, daily intake of sodium 1500mg , cholesterol 200mg , calories 30% from fat and 7% or less from saturated fats, daily to have 5 or more servings of fruits and vegetables.  Biometrics:  Pre Biometrics - 03/20/23 1008       Pre Biometrics   Height 5\' 11"  (1.803 m)    Weight 228 lb 14.4 oz (103.8 kg)    Waist Circumference 44 inches    Hip Circumference 39 inches    Waist to Hip Ratio 1.13 %    BMI (Calculated) 31.94    Triceps Skinfold 10 mm    % Body Fat 29.2 %    Grip Strength 10.9 kg    Single Leg Stand 0.8 seconds              Nutrition Therapy Plan and Nutrition Goals:  Nutrition Therapy & Goals - 03/20/23 0853       Personal Nutrition Goals   Comments Handout provided and explained regarding healthier choices.      Intervention Plan   Intervention Nutrition handout(s) given to patient.    Expected Outcomes Short Term Goal: Understand basic principles of dietary content, such as calories, fat, sodium, cholesterol and nutrients.             Nutrition Assessments:  Nutrition Assessments - 03/20/23 0853       MEDFICTS Scores   Pre Score 21            MEDIFICTS Score Key: >=70 Need to make dietary changes  40-70 Heart Healthy Diet <= 40 Therapeutic Level Cholesterol Diet   Picture Your Plate Scores: <16 Unhealthy dietary  pattern with much room for improvement. 41-50 Dietary pattern unlikely to meet recommendations for good health and room for improvement. 51-60 More healthful dietary pattern, with some room for improvement.  >60 Healthy dietary pattern, although there may be some specific behaviors that could be improved.    Nutrition Goals Re-Evaluation:  Nutrition Goals Re-Evaluation     Row Name 04/04/23 6063 04/21/23 0813           Goals   Nutrition Goal Healthy eating Short term: find healthy snack options other then popcorn long term: Continue to eat healthy food options      Comment Has started to eat more for breakfest since starting rehab to help control blood suger. He eats less red meat and eats more chicken and broild pork. He has cut down on his portion size. He no longer eats TV/precooked dinners. He eats popcorn to help cut cravings when needing a snack. Douglas Mason is doing well in rehab.  He was eating popcorn at night.  He is still drinking buttermilk, but doctor said it was okay.  He likes cooked carrots.  He has been working on portion control.  He is now sticking to jsut one serving and even his wife has said something to him about it.      Expected Outcome Short term: find healthy snack options other then popcorn   long term: Continue to eat healthy food options Short: Continue to work on healthy snacks Long: Continue to eat healthy               Nutrition Goals Discharge (Final Nutrition Goals Re-Evaluation):  Nutrition Goals Re-Evaluation - 04/21/23 0813       Goals   Nutrition Goal Short term: find healthy snack options other then popcorn long term: Continue to eat healthy food options    Comment Douglas Mason is doing well in rehab.  He was eating popcorn at night.  He is still drinking buttermilk, but doctor said it was okay.  He likes cooked carrots.  He has been working on portion control.  He is now sticking to jsut one serving and even his wife has said something to him about it.     Expected Outcome Short: Continue to work on healthy snacks Long: Continue to eat healthy             Psychosocial: Target Goals: Acknowledge presence or absence of significant depression and/or stress, maximize coping skills, provide positive support system. Participant is able to verbalize types and ability to use techniques and skills needed for reducing stress and depression.  Initial Review & Psychosocial Screening:  Initial Psych Review & Screening - 03/20/23 0855       Initial Review   Current issues with Current Anxiety/Panic      Family Dynamics   Good Support System? Yes    Comments Patient's wife and sister are his support people.      Barriers   Psychosocial barriers to participate in program The patient should benefit from training in stress management and relaxation.;There are no identifiable barriers or psychosocial needs.      Screening Interventions   Interventions Encouraged to exercise;To provide support and resources with identified psychosocial needs;Provide feedback about the scores to participant    Expected Outcomes Short Term goal: Utilizing psychosocial counselor, staff and physician to assist with identification of specific Stressors or current issues interfering with healing process. Setting desired goal for each stressor or current issue identified.;Long Term Goal: Stressors or current issues are controlled or eliminated.;Short Term goal: Identification and review with participant of any Quality of Life or Depression concerns found by scoring the questionnaire.;Long Term goal: The participant improves quality of Life and PHQ9 Scores as seen by post scores and/or verbalization of changes             Quality of Life Scores:  Quality of Life - 03/20/23 1012       Quality of Life   Select Quality of Life      Quality of Life Scores   Health/Function Pre 16 %    Socioeconomic Pre 21.93 %    Psych/Spiritual Pre 16.64 %    Family Pre 20.6 %     GLOBAL Pre 18.03 %            Scores of 19 and below usually indicate a poorer quality of life in these areas.  A difference of  2-3 points is a clinically meaningful difference.  A difference of 2-3 points in the total score  of the Quality of Life Index has been associated with significant improvement in overall quality of life, self-image, physical symptoms, and general health in studies assessing change in quality of life.  PHQ-9: Review Flowsheet       03/20/2023  Depression screen PHQ 2/9  Decreased Interest 1  Down, Depressed, Hopeless 1  PHQ - 2 Score 2  Altered sleeping 0  Tired, decreased energy 2  Change in appetite 0  Feeling bad or failure about yourself  0  Trouble concentrating 0  Moving slowly or fidgety/restless 0  Suicidal thoughts 0  PHQ-9 Score 4  Difficult doing work/chores Somewhat difficult    Details           Interpretation of Total Score  Total Score Depression Severity:  1-4 = Minimal depression, 5-9 = Mild depression, 10-14 = Moderate depression, 15-19 = Moderately severe depression, 20-27 = Severe depression   Psychosocial Evaluation and Intervention:  Psychosocial Evaluation - 03/20/23 0917       Psychosocial Evaluation & Interventions   Interventions Stress management education;Relaxation education;Encouraged to exercise with the program and follow exercise prescription    Comments Patient was referred to CR with STEMI/Stent placement and PTCA. 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 He is wearing a life vest for VT/VF. Patient is not aware why he is having to wear the vest. It has alarmed one time since he has been wearing. His PHQ-9 score was 4 due to feeling down and frustrutated with not being able to do the things he wants to do. He does have anxiety treated with Xanax at bedtime as needed. He feels his anxiety is  managed. He is retired from the [employer]. He lives with his wife who does support him but she is in poor health. He says his main support person is his sister. He has a daughter and a grandchild and is expecting his first great grandchild soon. He has no barriers identified to participate in the program. His main goals are to build his strength and stamina; to feel better again overall and get back to doing his normal activities.    Expected Outcomes Patient will continue to have no barriers identified and his anxiety will continue to be managed.    Continue Psychosocial Services  Follow up required by staff             Psychosocial Re-Evaluation:  Psychosocial Re-Evaluation     Row Name 04/04/23 [phone] 04/21/23 4332           Psychosocial Re-Evaluation   Current issues with None Identified --      Comments Only stressor is his defib vest that he is ready to have off. States that he is very happy with everything in his life and has enjoyed coming to rehab to socialize and improve health Douglas Mason is doing well in rehab.  He says that he is doing well in rehab and feeling better.  He is feeling good mentally and denies and major stressors.  He continues to sleep well and wakes daily at 4am.      Expected Outcomes Short term: continue to exercise to promote happy     long term: continue to identify stressor and life a happy life Short: Continue to exercise for mental boost Long: Conitnue to stay positive      Interventions Stress management education;Relaxation education;Encouraged  to attend Cardiac Rehabilitation for the exercise Encouraged to attend Cardiac Rehabilitation for the exercise;Stress management education      Continue Psychosocial Services  Follow up required by staff Follow up required by staff               Psychosocial Discharge (Final Psychosocial Re-Evaluation):  Psychosocial Re-Evaluation - 04/21/23 0811       Psychosocial Re-Evaluation   Comments Douglas Mason  is doing well in rehab.  He says that he is doing well in rehab and feeling better.  He is feeling good mentally and denies and major stressors.  He continues to sleep well and wakes daily at 4am.    Expected Outcomes Short: Continue to exercise for mental boost Long: Conitnue to stay positive    Interventions Encouraged to attend Cardiac Rehabilitation for the exercise;Stress management education    Continue Psychosocial Services  Follow up required by staff             Vocational Rehabilitation: Provide vocational rehab assistance to qualifying candidates.   Vocational Rehab Evaluation & Intervention:  Vocational Rehab - 03/20/23 0854       Initial Vocational Rehab Evaluation & Intervention   Assessment shows need for Vocational Rehabilitation No      Vocational Rehab Re-Evaulation   Comments Patient is retired.             Education: Education Goals: Education classes will be provided on a weekly basis, covering required topics. Participant will state understanding/return demonstration of topics presented.  Learning Barriers/Preferences:  Learning Barriers/Preferences - 03/20/23 0854       Learning Barriers/Preferences   Learning Barriers None    Learning Preferences Skilled Demonstration;Audio             Education Topics: Hypertension, Hypertension Reduction -Define heart disease and high blood pressure. Discus how high blood pressure affects the body and ways to reduce high blood pressure.   Exercise and Your Heart -Discuss why it is important to exercise, the FITT principles of exercise, normal and abnormal responses to exercise, and how to exercise safely.   Angina -Discuss definition of angina, causes of angina, treatment of angina, and how to decrease risk of having angina.   Cardiac Medications -Review what the following cardiac medications are used for, how they affect the body, and side effects that may occur when taking the medications.   Medications include Aspirin, Beta blockers, calcium channel blockers, ACE Inhibitors, angiotensin receptor blockers, diuretics, digoxin, and antihyperlipidemics. Flowsheet Row CARDIAC REHAB PHASE II EXERCISE from 05/07/2023 in [place] CARDIAC REHABILITATION  Date 03/26/23  Educator DJ  Instruction Review Code 1- Verbalizes Understanding       Congestive Heart Failure -Discuss the definition of CHF, how to live with CHF, the signs and symptoms of CHF, and how keep track of weight and sodium intake. Flowsheet Row CARDIAC REHAB PHASE II EXERCISE from 05/07/2023 in [place] CARDIAC REHABILITATION  Date 04/02/23  Educator [facility]  Instruction Review Code 1- Verbalizes Understanding       Heart Disease and Intimacy -Discus the effect sexual activity has on the heart, how changes occur during intimacy as we age, and safety during sexual activity. Flowsheet Row CARDIAC REHAB PHASE II EXERCISE from 05/07/2023 in [place] CARDIAC REHABILITATION  Date 04/09/23  Educator [facility]  Instruction Review Code 1- Verbalizes Understanding       Smoking Cessation / COPD -Discuss different methods to quit smoking, the health benefits of quitting smoking, and the definition of COPD.  Flowsheet Row CARDIAC REHAB PHASE II EXERCISE from 05/07/2023 in [place] CARDIAC REHABILITATION  Date 05/07/23  Educator [facility]  Instruction Review Code 2- Demonstrated Understanding       Nutrition I: Fats -Discuss the types of cholesterol, what cholesterol does to the heart, and how cholesterol levels can be controlled. Flowsheet Row CARDIAC REHAB PHASE II EXERCISE from 05/07/2023 in [place] CARDIAC REHABILITATION  Date 04/16/23  Educator [facility]  Instruction Review Code 1- Verbalizes Understanding       Nutrition II: Labels -Discuss the different components of food labels and how to read food label Flowsheet Row CARDIAC REHAB PHASE II EXERCISE from 05/07/2023 in [place] CARDIAC REHABILITATION  Date 04/16/23  Educator [facility]   Instruction Review Code 1- Verbalizes Understanding       Heart Parts/Heart Disease and PAD -Discuss the anatomy of the heart, the pathway of blood circulation through the heart, and these are affected by heart disease.   Stress I: Signs and Symptoms -Discuss the causes of stress, how stress may lead to anxiety and depression, and ways to limit stress. Flowsheet Row CARDIAC REHAB PHASE II EXERCISE from 05/07/2023 in [place] CARDIAC REHABILITATION  Date 04/23/23  Educator [facility]  Instruction Review Code 2- Demonstrated Understanding       Stress II: Relaxation -Discuss different types of relaxation techniques to limit stress. Flowsheet Row CARDIAC REHAB PHASE II EXERCISE from 05/07/2023 in [place] CARDIAC REHABILITATION  Date 04/23/23  Educator [facility]  Instruction Review Code 2- Demonstrated Understanding       Warning Signs of Stroke / TIA -Discuss definition of a stroke, what the signs and symptoms are of a stroke, and how to identify when someone is having stroke.   Knowledge Questionnaire Score:  Knowledge Questionnaire Score - 03/20/23 0854       Knowledge Questionnaire Score   Pre Score 19/24             Core Components/Risk Factors/Patient Goals at Admission:  Personal Goals and Risk Factors at Admission - 03/20/23 0854       Core Components/Risk Factors/Patient Goals on Admission    Weight Management Yes;Weight Loss;Obesity    Intervention Weight Management: Develop a combined nutrition and exercise program designed to reach desired caloric intake, while maintaining appropriate intake of nutrient and fiber, sodium and fats, and appropriate energy expenditure required for the weight goal.;Weight Management: Provide education and appropriate resources to help participant work on and attain dietary goals.;Weight Management/Obesity: Establish reasonable short term and long term weight goals.;Obesity: Provide education and appropriate resources to help participant  work on and attain dietary goals.    Admit Weight 228 lb 14.4 oz (103.8 kg)    Goal Weight: Short Term 223 lb (101.2 kg)    Goal Weight: Long Term 218 lb (98.9 kg)    Expected Outcomes Short Term: Continue to assess and modify interventions until short term weight is achieved;Long Term: Adherence to nutrition and physical activity/exercise program aimed toward attainment of established weight goal;Weight Loss: Understanding of general recommendations for a balanced deficit meal plan, which promotes 1-2 lb weight loss per week and includes a negative energy balance of [phone] kcal/d;Understanding recommendations for meals to include 15-35% energy as protein, 25-35% energy from fat, 35-60% energy from carbohydrates, less than 200mg  of dietary cholesterol, 20-35 gm of total fiber daily;Understanding of distribution of calorie intake throughout the day with the consumption of 4-5 meals/snacks    Improve shortness of breath with ADL's Yes    Intervention  Provide education, individualized exercise plan and daily activity instruction to help decrease symptoms of SOB with activities of daily living.    Expected Outcomes Short Term: Improve cardiorespiratory fitness to achieve a reduction of symptoms when performing ADLs;Long Term: Be able to perform more ADLs without symptoms or delay the onset of symptoms    Diabetes Yes    Intervention Provide education about signs/symptoms and action to take for hypo/hyperglycemia.;Provide education about proper nutrition, including hydration, and aerobic/resistive exercise prescription along with prescribed medications to achieve blood glucose in normal ranges: Fasting glucose 65-99 mg/dL    Expected Outcomes Short Term: Participant verbalizes understanding of the signs/symptoms and immediate care of hyper/hypoglycemia, proper foot care and importance of medication, aerobic/resistive exercise and nutrition plan for blood glucose control.;Long Term: Attainment of HbA1C < 7%.     Heart Failure Yes    Intervention Provide a combined exercise and nutrition program that is supplemented with education, support and counseling about heart failure. Directed toward relieving symptoms such as shortness of breath, decreased exercise tolerance, and extremity edema.    Expected Outcomes Short term: Attendance in program 2-3 days a week with increased exercise capacity. Reported lower sodium intake. Reported increased fruit and vegetable intake. Reports medication compliance.;Short term: Daily weights obtained and reported for increase. Utilizing diuretic protocols set by physician.;Long term: Adoption of self-care skills and reduction of barriers for early signs and symptoms recognition and intervention leading to self-care maintenance.    Hypertension Yes    Intervention Provide education on lifestyle modifcations including regular physical activity/exercise, weight management, moderate sodium restriction and increased consumption of fresh fruit, vegetables, and low fat dairy, alcohol moderation, and smoking cessation.;Monitor prescription use compliance.    Expected Outcomes Short Term: Continued assessment and intervention until BP is < 140/37mm HG in hypertensive participants. < 130/18mm HG in hypertensive participants with diabetes, heart failure or chronic kidney disease.;Long Term: Maintenance of blood pressure at goal levels.    Lipids Yes    Intervention Provide education and support for participant on nutrition & aerobic/resistive exercise along with prescribed medications to achieve LDL 70mg , HDL >40mg .    Expected Outcomes Short Term: Participant states understanding of desired cholesterol values and is compliant with medications prescribed. Participant is following exercise prescription and nutrition guidelines.;Long Term: Cholesterol controlled with medications as prescribed, with individualized exercise RX and with personalized nutrition plan. Value goals: LDL < 70mg , HDL > 40  mg.             Core Components/Risk Factors/Patient Goals Review:   Goals and Risk Factor Review     Row Name 04/04/23 0828 04/21/23 0818           Core Components/Risk Factors/Patient Goals Review   Personal Goals Review Diabetes;Weight Management/Obesity Diabetes;Weight Management/Obesity;Hypertension;Heart Failure      Review Douglas Mason states that he wants to stay healthy and be ale to live as long as he can for his grandchilderen. He has been watching what he eats to help manage his diabetes. HE eats more in the morning before rehab due to his CBG being low (70s) in the morining when getting to rehab. Douglas Mason is doing well in rehab. His weight is trending down.  His blood sugars are doing better, he has noticed a big differnce since eating a peanut butter protein bar in the morning.  His blood pressures are doing well in rehab but he is not checking it at home usually.  He will check if it is high as it will buzz  in his head.  He has noticed any heart failure symptoms and says his fluid pills are working well for him.  He is feeling good overall.      Expected Outcomes Short term: continue to exercise and manage diabetes  long term: Continue to attend rehab Short: conitnue to work on weight loss and keep eye on sugars Long: continue to monitor risk factors               Core Components/Risk Factors/Patient Goals at Discharge (Final Review):   Goals and Risk Factor Review - 04/21/23 0818       Core Components/Risk Factors/Patient Goals Review   Personal Goals Review Diabetes;Weight Management/Obesity;Hypertension;Heart Failure    Review Douglas Mason is doing well in rehab. His weight is trending down.  His blood sugars are doing better, he has noticed a big differnce since eating a peanut butter protein bar in the morning.  His blood pressures are doing well in rehab but he is not checking it at home usually.  He will check if it is high as it will buzz in his head.  He has noticed any heart  failure symptoms and says his fluid pills are working well for him.  He is feeling good overall.    Expected Outcomes Short: conitnue to work on weight loss and keep eye on sugars Long: continue to monitor risk factors             ITP Comments:  ITP Comments     Row Name 03/20/23 0938 03/24/23 0818 03/28/23 0837 04/16/23 0759 05/14/23 0753   ITP Comments Patient arrived for 1st visit/orientation/education at 0800. Patient was referred to CR by Dr. Izora Ribas due to STEMI/Stent Placement/PTCA. During orientation advised patient on arrival and appointment times what to wear, what to do before, during and after exercise. Reviewed attendance and class policy.  Pt is scheduled to return Cardiac Rehab on 03/24/23 at 0815. Pt was advised to come to class 15 minutes before class starts.  Discussed RPE/Dpysnea scales. Patient participated in warm up stretches. Patient was able to complete 6 minute walk test.  Telemetry Ventricular paced rhythm. Patient was measured for the equipment. Discussed equipment safety with patient. Took patient pre-anthropometric measurements. Patient finished visit at 0920. . First full day of exercise!  Patient was oriented to gym and equipment including functions, settings, policies, and procedures.  Patient's individual exercise prescription and treatment plan were reviewed.  All starting workloads were established based on the results of the 6 minute walk test done at initial orientation visit.  The plan for exercise progression was also introduced and progression will be customized based on patient's performance and goals. Douglas Mason started day with a low blood sugar and needed two OJ to get it above 110.  Once above, he was able to exercise. 30 day review completed. ITP sent to Dr. Dina Rich, Medical Director of Cardiac Rehab. Continue with ITP unless changes are made by physician. 30 day review completed. ITP sent to Dr. Dina Rich, Medical Director of Cardiac Rehab.  Continue with ITP unless changes are made by physician.            Comments: 30 day review

## 2023-05-16 ENCOUNTER — Encounter (HOSPITAL_COMMUNITY): Payer: Medicare Other

## 2023-05-16 ENCOUNTER — Encounter (HOSPITAL_COMMUNITY)
Admission: RE | Admit: 2023-05-16 | Discharge: 2023-05-16 | Disposition: A | Discharge status: Home / Self Care | Payer: Medicare Other | Source: Ambulatory Visit | Attending: Internal Medicine | Admitting: Internal Medicine

## 2023-05-16 DIAGNOSIS — Z9861 Coronary angioplasty status: Secondary | ICD-10-CM

## 2023-05-16 DIAGNOSIS — Z955 Presence of coronary angioplasty implant and graft: Secondary | ICD-10-CM | POA: Diagnosis not present

## 2023-05-16 DIAGNOSIS — I213 ST elevation (STEMI) myocardial infarction of unspecified site: Secondary | ICD-10-CM | POA: Diagnosis not present

## 2023-05-16 NOTE — Progress Notes (Signed)
Daily Session Note  Patient Details  Name: WINSOR ESS MRN: [id number] Date of Birth: 01-25-1951 Referring Provider:   Flowsheet Row CARDIAC REHAB PHASE II ORIENTATION from 03/20/2023 in [facility] CARDIAC REHABILITATION  Referring Provider [name] MD       Encounter Date: 05/16/2023  Check In:  Session Check In - 05/16/23 0751       Check-In   Supervising physician immediately available to respond to emergencies See telemetry face sheet for immediately available MD    Location [facility]    Staff Present Ross Ludwig, BS, Exercise Physiologist;Takyah Ciaramitaro Daphine Deutscher, RN, BSN;Jessica Hawkins, MA, RCEP, CCRP, CCET    Virtual Visit No    Medication changes reported     No    Fall or balance concerns reported    Yes    Comments Patient has history of falls and uses a cane.    Tobacco Cessation No Change    Warm-up and Cool-down Performed on first and last piece of equipment    Resistance Training Performed Yes    VAD Patient? No      Pain Assessment   Currently in Pain? No/denies    Pain Score 0-No pain             Capillary Blood Glucose: No results found for this or any previous visit (from the past 24 hour(s)).    Social History   Tobacco Use  Smoking Status Former   Current packs/day: 0.00   Types: Cigarettes   Quit date: 09/02/1976   Years since quitting: 46.7  Smokeless Tobacco Former   Types: Chew   Quit date: 09/03/2003    Goals Met:  Independence with exercise equipment Exercise tolerated well No report of concerns or symptoms today Strength training completed today  Goals Unmet:  Not Applicable  Comments: Pt able to follow exercise prescription today without complaint.  Will continue to monitor for progression.    Dr. Dina Rich is Medical Director for [facility] Cardiac Rehab

## 2023-05-19 ENCOUNTER — Encounter (HOSPITAL_COMMUNITY): Payer: Medicare Other

## 2023-05-19 ENCOUNTER — Encounter (HOSPITAL_COMMUNITY)
Admission: RE | Admit: 2023-05-19 | Discharge: 2023-05-19 | Disposition: A | Discharge status: Home / Self Care | Payer: Medicare Other | Source: Ambulatory Visit | Attending: Internal Medicine | Admitting: Internal Medicine

## 2023-05-19 DIAGNOSIS — I213 ST elevation (STEMI) myocardial infarction of unspecified site: Secondary | ICD-10-CM

## 2023-05-19 DIAGNOSIS — Z9861 Coronary angioplasty status: Secondary | ICD-10-CM

## 2023-05-19 DIAGNOSIS — Z955 Presence of coronary angioplasty implant and graft: Secondary | ICD-10-CM

## 2023-05-19 NOTE — Progress Notes (Signed)
Daily Session Note  Patient Details  Name: Douglas Mason MRN: [id number] Date of Birth: 07-25-1951 Referring Provider:   Flowsheet Row CARDIAC REHAB PHASE II ORIENTATION from 03/20/2023 in [facility] CARDIAC REHABILITATION  Referring Provider [name] MD       Encounter Date: 05/19/2023  Check In:  Session Check In - 05/19/23 0811       Check-In   Supervising physician immediately available to respond to emergencies See telemetry face sheet for immediately available MD    Location [facility]    Staff Present Ross Ludwig, BS, Exercise Physiologist;Debra Laural Benes, RN, Thomos Lemons, MA, RCEP, CCRP, CCET    Virtual Visit No    Medication changes reported     No    Fall or balance concerns reported    No    Warm-up and Cool-down Performed on first and last piece of equipment    Resistance Training Performed Yes    VAD Patient? No    PAD/SET Patient? No      Pain Assessment   Currently in Pain? No/denies             Capillary Blood Glucose: No results found for this or any previous visit (from the past 24 hour(s)).    Social History   Tobacco Use  Smoking Status Former   Current packs/day: 0.00   Types: Cigarettes   Quit date: 09/02/1976   Years since quitting: 46.7  Smokeless Tobacco Former   Types: Chew   Quit date: 09/03/2003    Goals Met:  Independence with exercise equipment Exercise tolerated well Personal goals reviewed No report of concerns or symptoms today Strength training completed today  Goals Unmet:  Not Applicable  Comments: Pt able to follow exercise prescription today without complaint.  Will continue to monitor for progression.    Dr. Dina Rich is Medical Director for [facility] Cardiac Rehab

## 2023-05-21 ENCOUNTER — Encounter (HOSPITAL_COMMUNITY)
Admission: RE | Admit: 2023-05-21 | Discharge: 2023-05-21 | Disposition: A | Discharge status: Home / Self Care | Payer: Medicare Other | Source: Ambulatory Visit | Attending: Internal Medicine | Admitting: Internal Medicine

## 2023-05-21 ENCOUNTER — Encounter (HOSPITAL_COMMUNITY): Payer: Medicare Other

## 2023-05-21 DIAGNOSIS — I213 ST elevation (STEMI) myocardial infarction of unspecified site: Secondary | ICD-10-CM | POA: Diagnosis not present

## 2023-05-21 DIAGNOSIS — Z955 Presence of coronary angioplasty implant and graft: Secondary | ICD-10-CM | POA: Diagnosis not present

## 2023-05-21 DIAGNOSIS — Z9861 Coronary angioplasty status: Secondary | ICD-10-CM | POA: Diagnosis not present

## 2023-05-21 NOTE — Progress Notes (Signed)
Daily Session Note  Patient Details  Name: Douglas Mason MRN: [id number] Date of Birth: [birth year] Referring Provider:   Flowsheet Row CARDIAC REHAB PHASE II ORIENTATION from 03/20/2023 in [facility] CARDIAC REHABILITATION  Referring Provider [name] MD       Encounter Date: 05/21/2023  Check In:  Session Check In - 05/21/23 0755       Check-In   Supervising physician immediately available to respond to emergencies See telemetry face sheet for immediately available MD    Location [facility]    Staff Present Fabio Pierce, MA, RCEP, CCRP, CCET;Heather Fredric Mare, [place], Exercise Physiologist;Daphyne Daphine Deutscher, RN, BSN    Virtual Visit No    Medication changes reported     No    Fall or balance concerns reported    No    Warm-up and Cool-down Performed on first and last piece of equipment    Resistance Training Performed Yes    VAD Patient? No    PAD/SET Patient? No      Pain Assessment   Currently in Pain? No/denies             Capillary Blood Glucose: No results found for this or any previous visit (from the past 24 hour(s)).    Social History   Tobacco Use  Smoking Status Former   Current packs/day: 0.00   Types: Cigarettes   Quit date: 09/02/1976   Years since quitting: 46.7  Smokeless Tobacco Former   Types: Chew   Quit date: 09/03/2003    Goals Met:  Independence with exercise equipment Exercise tolerated well No report of concerns or symptoms today Strength training completed today  Goals Unmet:  Not Applicable  Comments: Pt able to follow exercise prescription today without complaint.  Will continue to monitor for progression.    Dr. Dina Rich is Medical Director for [facility] Cardiac Rehab

## 2023-05-22 ENCOUNTER — Ambulatory Visit: Payer: Medicare Other | Admitting: Internal Medicine

## 2023-05-23 ENCOUNTER — Encounter (HOSPITAL_COMMUNITY): Payer: Medicare Other

## 2023-05-23 ENCOUNTER — Encounter (HOSPITAL_COMMUNITY)
Admission: RE | Admit: 2023-05-23 | Discharge: 2023-05-23 | Disposition: A | Discharge status: Home / Self Care | Payer: Medicare Other | Source: Ambulatory Visit | Attending: Internal Medicine | Admitting: Internal Medicine

## 2023-05-23 DIAGNOSIS — Z9861 Coronary angioplasty status: Secondary | ICD-10-CM

## 2023-05-23 DIAGNOSIS — I213 ST elevation (STEMI) myocardial infarction of unspecified site: Secondary | ICD-10-CM

## 2023-05-23 DIAGNOSIS — Z955 Presence of coronary angioplasty implant and graft: Secondary | ICD-10-CM | POA: Diagnosis not present

## 2023-05-23 NOTE — Progress Notes (Signed)
Daily Session Note  Patient Details  Name: Douglas Mason MRN: [id number] Date of Birth: 1950/11/18 Referring Provider:   Flowsheet Row CARDIAC REHAB PHASE II ORIENTATION from 03/20/2023 in [facility] CARDIAC REHABILITATION  Referring Provider [name] MD       Encounter Date: 05/23/2023  Check In:  Session Check In - 05/23/23 0745       Check-In   Supervising physician immediately available to respond to emergencies See telemetry face sheet for immediately available MD    Location [facility]    Staff Present Fabio Pierce, MA, RCEP, CCRP, CCET;Heather Fredric Mare, [place], Exercise Physiologist;Myles Tavella Daphine Deutscher, RN, BSN    Virtual Visit No    Medication changes reported     No    Fall or balance concerns reported    Yes    Comments Patient has history of falls and uses a cane.    Tobacco Cessation No Change    Warm-up and Cool-down Performed on first and last piece of equipment    Resistance Training Performed Yes    VAD Patient? No      Pain Assessment   Currently in Pain? No/denies             Capillary Blood Glucose: No results found for this or any previous visit (from the past 24 hour(s)).    Social History   Tobacco Use  Smoking Status Former   Current packs/day: 0.00   Types: Cigarettes   Quit date: 09/02/1976   Years since quitting: 46.7  Smokeless Tobacco Former   Types: Chew   Quit date: 09/03/2003    Goals Met:  Independence with exercise equipment Exercise tolerated well No report of concerns or symptoms today Strength training completed today  Goals Unmet:  Not Applicable  Comments: Pt able to follow exercise prescription today without complaint.  Will continue to monitor for progression.    Dr. Dina Rich is Medical Director for [facility] Cardiac Rehab

## 2023-05-26 ENCOUNTER — Encounter (HOSPITAL_COMMUNITY): Payer: Medicare Other

## 2023-05-27 ENCOUNTER — Ambulatory Visit: Payer: Medicare Other | Attending: Internal Medicine | Admitting: Internal Medicine

## 2023-05-27 ENCOUNTER — Encounter: Payer: Self-pay | Admitting: Internal Medicine

## 2023-05-27 VITALS — BP 106/60 | HR 64 | Wt 217.0 lb

## 2023-05-27 DIAGNOSIS — I2510 Atherosclerotic heart disease of native coronary artery without angina pectoris: Secondary | ICD-10-CM | POA: Diagnosis not present

## 2023-05-27 DIAGNOSIS — E7849 Other hyperlipidemia: Secondary | ICD-10-CM | POA: Diagnosis not present

## 2023-05-27 DIAGNOSIS — Z7902 Long term (current) use of antithrombotics/antiplatelets: Secondary | ICD-10-CM | POA: Insufficient documentation

## 2023-05-27 NOTE — Progress Notes (Unsigned)
Cardiology Office Note  Date: 05/27/2023   ID: Tavione, Reigner April 03, 1951, MRN [id number]  PCP:  Benita Stabile, MD  Cardiologist:  Marjo Bicker, MD Electrophysiologist:  None   History of Present Illness: FELIPE VOYTKO is a 72 y.o. male  Past Medical History:  Diagnosis Date   Bronchitis    Diabetes mellitus without complication (HCC)    GERD (gastroesophageal reflux disease)    High cholesterol    Hypertension    Pleurisy    Pneumonia    Sleep apnea    nurse in pre-op observed pt sats drop to 80-82% while sleeping on RA,wake up would go to 87%    Past Surgical History:  Procedure Laterality Date   CHOLECYSTECTOMY N/A 01/13/2019   Procedure: LAPAROSCOPIC CHOLECYSTECTOMY;  Surgeon: Lucretia Roers, MD;  Location: [facility];  Service: General;  Laterality: N/A;   CORONARY IMAGING/OCT N/A 02/20/2023   Procedure: CORONARY IMAGING/OCT;  Surgeon: Orbie Pyo, MD;  Location: [facility] INVASIVE CV LAB;  Service: Cardiovascular;  Laterality: N/A;   CORONARY STENT INTERVENTION N/A 02/20/2023   Procedure: CORONARY STENT INTERVENTION;  Surgeon: Orbie Pyo, MD;  Location: [facility] INVASIVE CV LAB;  Service: Cardiovascular;  Laterality: N/A;   CORONARY/GRAFT ACUTE MI REVASCULARIZATION N/A 03/01/2023   Procedure: Coronary/Graft Acute MI Revascularization;  Surgeon: Corky Crafts, MD;  Location: [facility] INVASIVE CV LAB;  Service: Cardiovascular;  Laterality: N/A;   LEFT HEART CATH AND CORONARY ANGIOGRAPHY N/A 02/20/2023   Procedure: LEFT HEART CATH AND CORONARY ANGIOGRAPHY;  Surgeon: Orbie Pyo, MD;  Location: [facility] IN[facility] CV LAB;  Service: Cardiovascular;  Laterality: N/A;   LEFT HEART CATH AND CORONARY ANGIOGRAPHY N/A 03/01/2023   Procedure: LEFT HEART CATH AND CORONARY ANGIOGRAPHY;  Surgeon: Corky Crafts, MD;  Location[facility] INVASIVE CV LAB;  Service: Cardiovascular;  Laterality: N/A;    Current Outpatient Medications  Medication Sig Dispense Refill   albuterol  (PROVENTIL HFA;VENTOLIN HFA) 108 (90 BASE) MCG/ACT inhaler Inhale 2 puffs into the lungs every 4 (four) hours as needed for wheezing or shortness of breath. 1 Inhaler 1   albuterol (PROVENTIL) (2.5 MG/3ML) 0.083% nebulizer solution Take 3 mLs (2.5 mg total) by nebulization every 6 (six) hours as needed for wheezing or shortness of breath. 75 mL 12   ALPRAZolam (XANAX) 0.5 MG tablet Take 0.5 mg by mouth at bedtime as needed for anxiety.     aspirin EC 81 MG tablet Take 1 tablet (81 mg total) by mouth daily. 120 tablet 0   atorvastatin (LIPITOR) 80 MG tablet Take 1 tablet (80 mg total) by mouth daily. 30 tablet 11   azaTHIOprine (IMURAN) 50 MG tablet take 1 tablet by mouth once daily 30 tablet 11   bisoprolol (ZEBETA) 5 MG tablet Take 1 tablet (5 mg total) by mouth daily. 90 tablet 2   empagliflozin (JARDIANCE) 25 MG TABS tablet Take 1 tablet (25 mg total) by mouth daily. 30 tablet 11   HYDROcodone-acetaminophen (NORCO/VICODIN) 5-325 MG tablet Take 1 tablet by mouth at bedtime as needed for pain.     isosorbide mononitrate (IMDUR) 30 MG 24 hr tablet Take 1 tablet (30 mg total) by mouth daily. 90 tablet 3   losartan (COZAAR) 25 MG tablet Take 1 tablet (25 mg total) by mouth daily. 30 tablet 1   Magnesium 400 MG TABS Take 1 tablet by mouth daily.     metFORMIN (GLUCOPHAGE-XR) 500 MG 24 hr tablet Take 2 tablets (1,000 mg total) by  mouth 2 (two) times a day.     nitroGLYCERIN (NITROSTAT) 0.4 MG SL tablet Place 1 tablet (0.4 mg total) under the tongue every 5 (five) minutes as needed for chest pain. 25 tablet 1   pantoprazole (PROTONIX) 40 MG tablet Take 40 mg by mouth daily.     prasugrel (EFFIENT) 10 MG TABS tablet Take 1 tablet (10 mg total) by mouth daily. 90 tablet 3   spironolactone (ALDACTONE) 25 MG tablet Take 0.5 tablets (12.5 mg total) by mouth daily. 45 tablet 3   TOUJEO SOLOSTAR 300 UNIT/ML Solostar Pen Inject 12 Units into the skin daily.     No current facility-administered medications for  this visit.   Allergies:  Lisinopril   Social History: The patient  reports that he quit smoking about 46 years ago. His smoking use included cigarettes. He quit smokeless tobacco use about 19 years ago.  His smokeless tobacco use included chew. He reports that he does not drink alcohol and does not use drugs.   Family History: The patient's family history includes Asthma in his mother; Leukemia in his mother; Rheum arthritis in his maternal grandmother and mother.   ROS:  Please see the history of present illness. Otherwise, complete review of systems is positive for none.  All other systems are reviewed and negative.   Physical Exam: VS:  BP 106/60   Pulse 64   Wt 217 lb (98.4 kg)   SpO2 95%   BMI 29.43 kg/m , BMI Body mass index is 29.43 kg/m.  Wt Readings from Last 3 Encounters:  05/27/23 217 lb (98.4 kg)  04/23/23 244 lb 6.4 oz (110.9 kg)  03/20/23 228 lb 14.4 oz (103.8 kg)    General: Patient appears comfortable at rest. HEENT: Conjunctiva and lids normal, oropharynx clear with moist mucosa. Neck: Supple, no elevated JVP or carotid bruits, no thyromegaly. Lungs: Clear to auscultation, nonlabored breathing at rest. Cardiac: Regular rate and rhythm, no S3 or significant systolic murmur, no pericardial rub. Abdomen: Soft, nontender, no hepatomegaly, bowel sounds present, no guarding or rebound. Extremities: No pitting edema, distal pulses 2+. Skin: Warm and dry. Musculoskeletal: No kyphosis. Neuropsychiatric: Alert and oriented x3, affect grossly appropriate.  Recent Labwork: 11/12/2022: Magnesium 1.3 03/01/2023: ALT 39; AST 31 03/02/2023: B Natriuretic Peptide 142.0 03/11/2023: BUN 21; Creatinine, Ser 1.24; Hemoglobin 10.1; Platelets 485; Potassium 4.4; Sodium 137     Component Value Date/Time   CHOL 115 02/19/2023 0732   TRIG 107 02/19/2023 0732   HDL 27 (L) 02/19/2023 0732   CHOLHDL 4.3 02/19/2023 0732   VLDL 21 02/19/2023 0732   LDLCALC 67 02/19/2023 0732      Assessment and Plan:      Medication Adjustments/Labs and Tests Ordered: Current medicines are reviewed at length with the patient today.  Concerns regarding medicines are outlined above.    Disposition:  Follow up   Signed, Ashlye Oviedo Verne Spurr, MD, 05/27/2023 3:52 PM    [facility] at [facility] [address] [place], [place] [address]

## 2023-05-27 NOTE — Patient Instructions (Signed)
Medication Instructions:  Your physician recommends that you continue on your current medications as directed. Please refer to the Current Medication list given to you today.  *If you need a refill on your cardiac medications before your next appointment, please call your pharmacy*   Lab Work: None If you have labs (blood work) drawn today and your tests are completely normal, you will receive your results only by: MyChart Message (if you have MyChart) OR A paper copy in the mail If you have any lab test that is abnormal or we need to change your treatment, we will call you to review the results.   Testing/Procedures: None   Follow-Up: At [facility], you and your health needs are our priority.  As part of our continuing mission to provide you with exceptional heart care, we have created designated Provider Care Teams.  These Care Teams include your primary Cardiologist (physician) and Advanced Practice Providers (APPs -  Physician Assistants and Nurse Practitioners) who all work together to provide you with the care you need, when you need it.  We recommend signing up for the patient portal called "MyChart".  Sign up information is provided on this After Visit Summary.  MyChart is used to connect with patients for Virtual Visits (Telemedicine).  Patients are able to view lab/test results, encounter notes, upcoming appointments, etc.  Non-urgent messages can be sent to your provider as well.   To learn more about what you can do with MyChart, go to [email or web address].    Your next appointment:    June 2025  Provider:   You may see Luane School, MD or one of the following Advanced Practice Providers on your designated Care Team:   [place], PA-C  Jacolyn Reedy, [place]     Other Instructions

## 2023-05-28 ENCOUNTER — Other Ambulatory Visit (HOSPITAL_COMMUNITY): Payer: Self-pay

## 2023-05-28 ENCOUNTER — Encounter (HOSPITAL_COMMUNITY)
Admission: RE | Admit: 2023-05-28 | Discharge: 2023-05-28 | Disposition: A | Discharge status: Home / Self Care | Payer: Medicare Other | Source: Ambulatory Visit | Attending: Internal Medicine | Admitting: Internal Medicine

## 2023-05-28 ENCOUNTER — Encounter (HOSPITAL_COMMUNITY): Payer: Medicare Other

## 2023-05-28 DIAGNOSIS — Z9861 Coronary angioplasty status: Secondary | ICD-10-CM | POA: Diagnosis not present

## 2023-05-28 DIAGNOSIS — I213 ST elevation (STEMI) myocardial infarction of unspecified site: Secondary | ICD-10-CM | POA: Diagnosis not present

## 2023-05-28 DIAGNOSIS — Z955 Presence of coronary angioplasty implant and graft: Secondary | ICD-10-CM | POA: Diagnosis not present

## 2023-05-28 NOTE — Progress Notes (Signed)
Daily Session Note  Patient Details  Name: Douglas Mason MRN: [id number] Date of Birth: 07-08-1951 Referring Provider:   Flowsheet Row CARDIAC REHAB PHASE II ORIENTATION from 03/20/2023 in [facility] CARDIAC REHABILITATION  Referring Provider [name] MD       Encounter Date: 05/28/2023  Check In:  Session Check In - 05/28/23 0757       Check-In   Supervising physician immediately available to respond to emergencies See telemetry face sheet for immediately available MD    Location [facility]    Staff Present Fabio Pierce, MA, RCEP, CCRP, CCET;Heather Fredric Mare, BS, Exercise Physiologist;Debra Laural Benes, RN, BSN    Virtual Visit No    Medication changes reported     No    Fall or balance concerns reported    No    Warm-up and Cool-down Performed on first and last piece of equipment    Resistance Training Performed Yes    VAD Patient? No    PAD/SET Patient? No      Pain Assessment   Currently in Pain? No/denies             Capillary Blood Glucose: No results found for this or any previous visit (from the past 24 hour(s)).    Social History   Tobacco Use  Smoking Status Former   Current packs/day: 0.00   Types: Cigarettes   Quit date: 09/02/1976   Years since quitting: 46.7  Smokeless Tobacco Former   Types: Chew   Quit date: 09/03/2003    Goals Met:  Independence with exercise equipment Using PLB without cueing & demonstrates good technique Exercise tolerated well No report of concerns or symptoms today Strength training completed today  Goals Unmet:  Not Applicable  Comments: Pt able to follow exercise prescription today without complaint.  Will continue to monitor for progression.    Dr. Dina Rich is Medical Director for [facility] Cardiac Rehab

## 2023-05-29 ENCOUNTER — Ambulatory Visit: Payer: Medicare Other | Attending: Nurse Practitioner

## 2023-05-29 DIAGNOSIS — I255 Ischemic cardiomyopathy: Secondary | ICD-10-CM | POA: Insufficient documentation

## 2023-05-29 DIAGNOSIS — Z7902 Long term (current) use of antithrombotics/antiplatelets: Secondary | ICD-10-CM | POA: Insufficient documentation

## 2023-05-29 DIAGNOSIS — E782 Mixed hyperlipidemia: Secondary | ICD-10-CM | POA: Insufficient documentation

## 2023-05-29 DIAGNOSIS — I2510 Atherosclerotic heart disease of native coronary artery without angina pectoris: Secondary | ICD-10-CM | POA: Insufficient documentation

## 2023-05-29 DIAGNOSIS — E785 Hyperlipidemia, unspecified: Secondary | ICD-10-CM | POA: Insufficient documentation

## 2023-05-29 LAB — ECHOCARDIOGRAM COMPLETE
AR max vel: 2.61 cm2
AV Area VTI: 2.74 cm2
AV Area mean vel: 2.85 cm2
AV Mean grad: 4 mmHg
AV Peak grad: 8.6 mmHg
Ao pk vel: 1.47 m/s
Area-P 1/2: 3.5 cm2
Calc EF: 50.4 %
MV M vel: 5.51 m/s
MV Peak grad: 121.4 mmHg
MV VTI: 2.54 cm2
S' Lateral: 4.3 cm
Single Plane A2C EF: 49.3 %
Single Plane A4C EF: 49.4 %

## 2023-05-30 ENCOUNTER — Encounter (HOSPITAL_COMMUNITY): Payer: Medicare Other

## 2023-05-30 ENCOUNTER — Encounter (HOSPITAL_COMMUNITY)
Admission: RE | Admit: 2023-05-30 | Discharge: 2023-05-30 | Disposition: A | Discharge status: Home / Self Care | Payer: Medicare Other | Source: Ambulatory Visit | Attending: Internal Medicine | Admitting: Internal Medicine

## 2023-05-30 DIAGNOSIS — Z955 Presence of coronary angioplasty implant and graft: Secondary | ICD-10-CM

## 2023-05-30 DIAGNOSIS — I213 ST elevation (STEMI) myocardial infarction of unspecified site: Secondary | ICD-10-CM | POA: Diagnosis not present

## 2023-05-30 DIAGNOSIS — Z9861 Coronary angioplasty status: Secondary | ICD-10-CM

## 2023-05-30 NOTE — Progress Notes (Signed)
Daily Session Note  Patient Details  Name: Douglas Mason MRN: [id number] Date of Birth: 1950-10-05 Referring Provider:   Flowsheet Row CARDIAC REHAB PHASE II ORIENTATION from 03/20/2023 in [facility] [place] CARDIAC REHABILITATION  Referring Provider [name] MD       Encounter Date: 05/30/2023  Check In:  Session Check In - 05/30/23 0757       Check-In   Supervising physician immediately available to respond to emergencies See telemetry face sheet for immediately available MD    Location [facility]    Staff Present Ross Ludwig, BS, Exercise Physiologist;Rakel Junio Juanetta Gosling, MA, RCEP, CCRP, Dow Adolph, RN, BSN    Virtual Visit No    Medication changes reported     No    Fall or balance concerns reported    No    Warm-up and Cool-down Performed on first and last piece of equipment    Resistance Training Performed Yes    VAD Patient? No    PAD/SET Patient? No      Pain Assessment   Currently in Pain? No/denies             Capillary Blood Glucose: No results found for this or any previous visit (from the past 24 hour(s)).    Social History   Tobacco Use  Smoking Status Former   Current packs/day: 0.00   Types: Cigarettes   Quit date: 09/02/1976   Years since quitting: 46.7  Smokeless Tobacco Former   Types: Chew   Quit date: 09/03/2003    Goals Met:  Independence with exercise equipment Exercise tolerated well No report of concerns or symptoms today Strength training completed today  Goals Unmet:  Not Applicable  Comments: Pt able to follow exercise prescription today without complaint.  Will continue to monitor for progression.    Dr. Dina Rich is Medical Director for [facility] Cardiac Rehab

## 2023-06-02 ENCOUNTER — Encounter (HOSPITAL_COMMUNITY)
Admission: RE | Admit: 2023-06-02 | Discharge: 2023-06-02 | Disposition: A | Discharge status: Home / Self Care | Payer: Medicare Other | Source: Ambulatory Visit | Attending: Internal Medicine | Admitting: Internal Medicine

## 2023-06-02 ENCOUNTER — Encounter (HOSPITAL_COMMUNITY): Payer: Medicare Other

## 2023-06-02 VITALS — Ht 71.0 in | Wt 219.0 lb

## 2023-06-02 DIAGNOSIS — Z955 Presence of coronary angioplasty implant and graft: Secondary | ICD-10-CM

## 2023-06-02 DIAGNOSIS — Z9861 Coronary angioplasty status: Secondary | ICD-10-CM

## 2023-06-02 DIAGNOSIS — I213 ST elevation (STEMI) myocardial infarction of unspecified site: Secondary | ICD-10-CM | POA: Diagnosis not present

## 2023-06-02 NOTE — Progress Notes (Signed)
Daily Session Note  Patient Details  Name: Douglas Mason MRN: [id number] Date of Birth: [birth year] Referring Provider:   Flowsheet Row CARDIAC REHAB PHASE II ORIENTATION from 03/20/2023 in [facility] CARDIAC REHABILITATION  Referring Provider [name] MD       Encounter Date: 06/02/2023  Check In:  Session Check In - 06/02/23 0745       Check-In   Supervising physician immediately available to respond to emergencies See telemetry face sheet for immediately available ER MD    Location [facility]    Staff Present Rodena Medin, RN, BSN;Jessica Juanetta Gosling, MA, RCEP, CCRP, CCET;Heather Fredric Mare, [place], Exercise Physiologist    Virtual Visit No    Medication changes reported     No    Fall or balance concerns reported    No    Warm-up and Cool-down Performed on first and last piece of equipment    VAD Patient? No    PAD/SET Patient? No      Pain Assessment   Currently in Pain? No/denies    Pain Score 0-No pain    Multiple Pain Sites No             Capillary Blood Glucose: No results found for this or any previous visit (from the past 24 hour(s)).    Social History   Tobacco Use  Smoking Status Former   Current packs/day: 0.00   Types: Cigarettes   Quit date: 09/02/1976   Years since quitting: 46.7  Smokeless Tobacco Former   Types: Chew   Quit date: 09/03/2003    Goals Met:  Independence with exercise equipment Exercise tolerated well No report of concerns or symptoms today Strength training completed today  Goals Unmet:  Not Applicable  Comments: Pt able to follow exercise prescription today without complaint.  Will continue to monitor for progression..   Dr. Dina Rich is Medical Director for [facility] Cardiac Rehab

## 2023-06-02 NOTE — Patient Instructions (Signed)
Discharge Patient Instructions  Patient Details  Name: Douglas Mason MRN: [id number] Date of Birth: 30-Oct-1950 Referring Provider:  Benita Stabile, MD   Number of Visits: 36  Reason for Discharge:  Patient reached a stable level of exercise. Patient independent in their exercise. Patient has met program and personal goals.  Smoking History:  Social History   Tobacco Use  Smoking Status Former   Current packs/day: 0.00   Types: Cigarettes   Quit date: 09/02/1976   Years since quitting: 46.7  Smokeless Tobacco Former   Types: Chew   Quit date: 09/03/2003    Diagnosis:  ST elevation myocardial infarction (STEMI), unspecified artery (HCC)  Status post coronary artery stent placement  Postsurgical percutaneous transluminal coronary angioplasty (PTCA) status  Initial Exercise Prescription:  Initial Exercise Prescription - 03/20/23 0900       Date of Initial Exercise RX and Referring Provider   Date 03/20/23    Referring Provider [name] MD      Oxygen   Maintain Oxygen Saturation 88% or higher      Treadmill   MPH 2    Grade 0.5    Minutes 15    METs 2.59      NuStep   Level 3    SPM 80    Minutes 15    METs 2.5      Prescription Details   Frequency (times per week) 3    Duration Progress to 30 minutes of continuous aerobic without signs/symptoms of physical distress      Intensity   THRR 40-80% of Max Heartrate 101-132    Ratings of Perceived Exertion 11-13    Perceived Dyspnea 0-4      Progression   Progression Continue to progress workloads to maintain intensity without signs/symptoms of physical distress.      Resistance Training   Training Prescription Yes    Weight 4 lb    Reps 10-15             Discharge Exercise Prescription (Final Exercise Prescription Changes):  Exercise Prescription Changes - 05/28/23 1300       Response to Exercise   Blood Pressure (Admit) 100/50    Blood Pressure (Exit) 106/60    Heart Rate  (Admit) 72 bpm    Heart Rate (Exercise) 107 bpm    Heart Rate (Exit) 76 bpm    Rating of Perceived Exertion (Exercise) 13    Duration Continue with 30 min of aerobic exercise without signs/symptoms of physical distress.    Intensity THRR unchanged      Progression   Progression Continue to progress workloads to maintain intensity without signs/symptoms of physical distress.      Resistance Training   Training Prescription Yes    Weight 4 lbs    Reps 10-15      Treadmill   MPH 2.1    Grade 1    Minutes 15    METs 2.9      NuStep   Level 4    SPM 81    Minutes 15    METs 2.4      Home Exercise Plan   Plans to continue exercise at Home (comment)    Frequency Add 2 additional days to program exercise sessions.      Oxygen   Maintain Oxygen Saturation 88% or higher             Functional Capacity:  6 Minute Walk     Row Name 03/20/23 0945 06/02/23  0813       6 Minute Walk   Phase Initial Discharge    Distance 1348 feet 1525 feet  initial recalculated 1288 Ft    Distance % Change -- 18.4 %    Distance Feet Change -- 237 ft    Walk Time 6 minutes 6 minutes    # of Rest Breaks 0 0    MPH 2.55 2.89    METS 2.67 3.15    RPE 11 12    Perceived Dyspnea  2 --    VO2 Peak 9.33 11.04    Symptoms Yes (comment) No    Comments SOB --    Resting HR 71 bpm 68 bpm    Resting BP 104/58 122/62    Resting Oxygen Saturation  96 % --    Exercise Oxygen Saturation  during 6 min walk 93 % --    Max Ex. HR 105 bpm 114 bpm    Max Ex. BP 134/72 134/74    2 Minute Post BP 128/62 --            Nutrition & Weight - Outcomes:  Pre Biometrics - 03/20/23 1008       Pre Biometrics   Height 5\' 11"  (1.803 m)    Weight 103.8 kg    Waist Circumference 44 inches    Hip Circumference 39 inches    Waist to Hip Ratio 1.13 %    BMI (Calculated) 31.94    Triceps Skinfold 10 mm    % Body Fat 29.2 %    Grip Strength 10.9 kg    Single Leg Stand 0.8 seconds             Post  Biometrics - 06/02/23 0815        Post  Biometrics   Height 5\' 11"  (1.803 m)    Weight 99.3 kg    Waist Circumference 41 inches    Hip Circumference 39 inches    Waist to Hip Ratio 1.05 %    BMI (Calculated) 30.56    Grip Strength 27.6 kg    Single Leg Stand 2.1 seconds

## 2023-06-04 ENCOUNTER — Encounter (HOSPITAL_COMMUNITY)
Admission: RE | Admit: 2023-06-04 | Discharge: 2023-06-04 | Disposition: A | Discharge status: Home / Self Care | Payer: Medicare Other | Source: Ambulatory Visit | Attending: Internal Medicine | Admitting: Internal Medicine

## 2023-06-04 ENCOUNTER — Encounter (HOSPITAL_COMMUNITY): Payer: Medicare Other

## 2023-06-04 DIAGNOSIS — Z9861 Coronary angioplasty status: Secondary | ICD-10-CM | POA: Insufficient documentation

## 2023-06-04 DIAGNOSIS — I213 ST elevation (STEMI) myocardial infarction of unspecified site: Secondary | ICD-10-CM

## 2023-06-04 DIAGNOSIS — Z955 Presence of coronary angioplasty implant and graft: Secondary | ICD-10-CM | POA: Diagnosis not present

## 2023-06-04 NOTE — Progress Notes (Signed)
Daily Session Note  Patient Details  Name: Douglas Mason MRN: [id number] Date of Birth: 14-Jun-1951 Referring Provider:   Flowsheet Row CARDIAC REHAB PHASE II ORIENTATION from 03/20/2023 in [facility] CARDIAC REHABILITATION  Referring Provider [name] MD       Encounter Date: 06/04/2023  Check In:  Session Check In - 06/04/23 0745       Check-In   Supervising physician immediately available to respond to emergencies See telemetry face sheet for immediately available MD    Location [facility]    Staff Present Ross Ludwig, BS, Exercise Physiologist;Hillary [place] BSN, RN;Debra Laural Benes, RN, BSN;Jessica Hawkins, MA, RCEP, CCRP, CCET    Virtual Visit No    Medication changes reported     No    Fall or balance concerns reported    Yes    Comments Patient has history of falls. Pt lost balance this week on the treadmill due to blowing his noise,  but was helped and the safety stopped    Tobacco Cessation No Change    Warm-up and Cool-down Performed on first and last piece of equipment    Resistance Training Performed Yes    VAD Patient? No    PAD/SET Patient? No      Pain Assessment   Currently in Pain? No/denies    Pain Score 0-No pain    Multiple Pain Sites No             Capillary Blood Glucose: No results found for this or any previous visit (from the past 24 hour(s)).    Social History   Tobacco Use  Smoking Status Former   Current packs/day: 0.00   Types: Cigarettes   Quit date: 09/02/1976   Years since quitting: 46.7  Smokeless Tobacco Former   Types: Chew   Quit date: 09/03/2003    Goals Met:  Independence with exercise equipment Exercise tolerated well No report of concerns or symptoms today Strength training completed today  Goals Unmet:  Not Applicable  Comments: Pt able to follow exercise prescription today without complaint.  Will continue to monitor for progression.    Dr. Dina Rich is Medical  Director for [facility] Cardiac Rehab

## 2023-06-06 ENCOUNTER — Encounter (HOSPITAL_COMMUNITY): Payer: Medicare Other

## 2023-06-06 ENCOUNTER — Encounter (HOSPITAL_COMMUNITY)
Admission: RE | Admit: 2023-06-06 | Discharge: 2023-06-06 | Disposition: A | Discharge status: Home / Self Care | Payer: Medicare Other | Source: Ambulatory Visit | Attending: Internal Medicine | Admitting: Internal Medicine

## 2023-06-06 DIAGNOSIS — Z955 Presence of coronary angioplasty implant and graft: Secondary | ICD-10-CM | POA: Diagnosis not present

## 2023-06-06 DIAGNOSIS — Z9861 Coronary angioplasty status: Secondary | ICD-10-CM

## 2023-06-06 DIAGNOSIS — I213 ST elevation (STEMI) myocardial infarction of unspecified site: Secondary | ICD-10-CM

## 2023-06-06 NOTE — Progress Notes (Signed)
Daily Session Note  Patient Details  Name: Douglas Mason MRN: [id number] Date of Birth: September 25, 1950 Referring Provider:   Flowsheet Row CARDIAC REHAB PHASE II ORIENTATION from 03/20/2023 in [facility] CARDIAC REHABILITATION  Referring Provider [name] MD       Encounter Date: 06/06/2023  Check In:  Session Check In - 06/06/23 0745       Check-In   Supervising physician immediately available to respond to emergencies See telemetry face sheet for immediately available ER MD    Location [facility]    Staff Present Fabio Pierce, MA, RCEP, CCRP, Dow Adolph, RN, BSN    Virtual Visit No    Medication changes reported     No    Fall or balance concerns reported    Yes    Comments Patient has history of falls. Pt lost balance this week on the treadmill due to blowing his noise,  but was helped and the safety stopped    Warm-up and Cool-down Performed on first and last piece of equipment    Resistance Training Performed Yes    VAD Patient? No    PAD/SET Patient? No      Pain Assessment   Currently in Pain? No/denies    Pain Score 0-No pain    Multiple Pain Sites No             Capillary Blood Glucose: No results found for this or any previous visit (from the past 24 hour(s)).    Social History   Tobacco Use  Smoking Status Former   Current packs/day: 0.00   Types: Cigarettes   Quit date: 09/02/1976   Years since quitting: 46.7  Smokeless Tobacco Former   Types: Chew   Quit date: 09/03/2003    Goals Met:  Independence with exercise equipment Exercise tolerated well No report of concerns or symptoms today Strength training completed today  Goals Unmet:  Not Applicable  Comments: Pt able to follow exercise prescription today without complaint.  Will continue to monitor for progression.    Dr. Dina Rich is Medical Director for [facility] Cardiac Rehab

## 2023-06-09 ENCOUNTER — Encounter (HOSPITAL_COMMUNITY): Payer: Medicare Other

## 2023-06-09 ENCOUNTER — Encounter (HOSPITAL_COMMUNITY)
Admission: RE | Admit: 2023-06-09 | Discharge: 2023-06-09 | Disposition: A | Discharge status: Home / Self Care | Payer: Medicare Other | Source: Ambulatory Visit | Attending: Internal Medicine | Admitting: Internal Medicine

## 2023-06-09 DIAGNOSIS — I213 ST elevation (STEMI) myocardial infarction of unspecified site: Secondary | ICD-10-CM | POA: Diagnosis not present

## 2023-06-09 DIAGNOSIS — Z9861 Coronary angioplasty status: Secondary | ICD-10-CM

## 2023-06-09 DIAGNOSIS — Z955 Presence of coronary angioplasty implant and graft: Secondary | ICD-10-CM | POA: Diagnosis not present

## 2023-06-09 NOTE — Progress Notes (Signed)
Daily Session Note  Patient Details  Name: Douglas Mason MRN: [id number] Date of Birth: Jun 11, 1951 Referring Provider:   Flowsheet Row CARDIAC REHAB PHASE II ORIENTATION from 03/20/2023 in [facility] CARDIAC REHABILITATION  Referring Provider [name] MD       Encounter Date: 06/09/2023  Check In:  Session Check In - 06/09/23 0745       Check-In   Supervising physician immediately available to respond to emergencies See telemetry face sheet for immediately available MD    Location [facility]    Staff Present Ross Ludwig, BS, Exercise Physiologist;Jadien Lehigh Daphine Deutscher, RN, BSN    Virtual Visit No    Medication changes reported     No    Fall or balance concerns reported    Yes    Comments Patient has history of falls. Pt lost balance this week on the treadmill due to blowing his noise,  but was helped and the safety stopped    Tobacco Cessation No Change    Warm-up and Cool-down Performed on first and last piece of equipment    Resistance Training Performed Yes    VAD Patient? No      Pain Assessment   Currently in Pain? No/denies             Capillary Blood Glucose: No results found for this or any previous visit (from the past 24 hour(s)).    Social History   Tobacco Use  Smoking Status Former   Current packs/day: 0.00   Types: Cigarettes   Quit date: 09/02/1976   Years since quitting: 46.7  Smokeless Tobacco Former   Types: Chew   Quit date: 09/03/2003    Goals Met:  Independence with exercise equipment Exercise tolerated well No report of concerns or symptoms today Strength training completed today  Goals Unmet:  Not Applicable  Comments:  Douglas Mason graduated today from  rehab with 36 sessions completed.  Details of the patient's exercise prescription and what He needs to do in order to continue the prescription and progress were discussed with patient.  Patient was given a copy of prescription and goals.  Patient verbalized  understanding. Douglas Mason plans to continue to exercise by possibly attend maintenance class and home exercises.    Dr. Dina Rich is Medical Director for [facility] Cardiac Rehab

## 2023-06-09 NOTE — Progress Notes (Signed)
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  Patient's Home Medications on Admission:  Current Outpatient Medications:    albuterol (PROVENTIL HFA;VENTOLIN HFA) 108 (90 BASE) MCG/ACT inhaler, Inhale 2 puffs into the lungs every 4 (four) hours as needed for wheezing or shortness of breath., Disp: 1 Inhaler, Rfl: 1   albuterol (PROVENTIL) (2.5 MG/3ML) 0.083% nebulizer solution, Take 3 mLs (2.5 mg total) by nebulization every 6 (six) hours as needed for wheezing or shortness of breath., Disp: 75 mL, Rfl: 12   ALPRAZolam (XANAX) 0.5 MG tablet, Take 0.5 mg by mouth at bedtime as needed for anxiety., Disp: , Rfl:    aspirin EC 81 MG tablet, Take 1 tablet (81 mg total) by mouth daily., Disp: 120 tablet, Rfl: 0   atorvastatin (LIPITOR) 80 MG tablet, Take 1 tablet (80 mg total) by mouth daily., Disp: 30 tablet, Rfl: 11   azaTHIOprine (IMURAN) 50 MG tablet, take 1 tablet by mouth once daily, Disp: 30 tablet, Rfl: 11   bisoprolol (ZEBETA) 5 MG tablet, Take 1 tablet (5 mg total) by mouth daily., Disp: 90 tablet, Rfl: 2   empagliflozin (JARDIANCE) 25 MG TABS tablet, Take 1 tablet (25 mg total) by mouth daily., Disp: 30 tablet, Rfl: 11   HYDROcodone-acetaminophen (NORCO/VICODIN) 5-325 MG tablet, Take 1 tablet by mouth at bedtime as needed for pain., Disp: , Rfl:    isosorbide mononitrate (IMDUR) 30 MG 24  hr tablet, Take 1 tablet (30 mg total) by mouth daily., Disp: 90 tablet, Rfl: 3   losartan (COZAAR) 25 MG tablet, Take 1 tablet (25 mg total) by mouth daily., Disp: 30 tablet, Rfl: 1   Magnesium 400 MG TABS, Take 1 tablet by mouth daily., Disp: , Rfl:    metFORMIN (GLUCOPHAGE-XR) 500 MG 24 hr tablet, Take 2 tablets (1,000 mg total) by mouth 2 (two) times a day., Disp: , Rfl:    nitroGLYCERIN (NITROSTAT) 0.4 MG SL tablet, Place 1 tablet (0.4 mg total) under the tongue every 5 (five) minutes as needed for chest pain., Disp: 25 tablet, Rfl: 1   pantoprazole (PROTONIX) 40 MG tablet, Take 40 mg by mouth daily., Disp: , Rfl:    prasugrel (EFFIENT) 10 MG TABS tablet, Take 1 tablet (10 mg total) by mouth daily., Disp: 90 tablet, Rfl: 3   spironolactone (ALDACTONE) 25 MG tablet, Take 0.5 tablets (12.5 mg total) by mouth daily., Disp: 45 tablet, Rfl: 3   TOUJEO SOLOSTAR 300 UNIT/ML Solostar Pen, Inject 12 Units into the skin daily., Disp: , Rfl:   Past Medical History: Past Medical History:  Diagnosis Date   Bronchitis    Diabetes mellitus without complication (HCC)    GERD (gastroesophageal reflux disease)    High cholesterol    Hypertension    Pleurisy    Pneumonia    Sleep apnea    nurse in pre-op observed pt sats drop to 80-82% while sleeping on RA,wake up would go to 87%    Tobacco  Use: Social History   Tobacco Use  Smoking Status Former   Current packs/day: 0.00   Types: Cigarettes   Quit date: 09/02/1976   Years since quitting: 46.7  Smokeless Tobacco Former   Types: Chew   Quit date: 09/03/2003    Labs: Review Flowsheet       Latest Ref Rng & Units 03/13/2014 02/19/2023 03/01/2023  Labs for ITP Cardiac and Pulmonary Rehab  Cholestrol 0 - 200 mg/dL 027  253  -  LDL (calc) 0 - 99 mg/dL 74  67  -  HDL-C >66 mg/dL 34  27  -  Trlycerides <150 mg/dL 68  440  -  Hemoglobin A1c 4.8 - 5.6 % 5.5  6.0  -  TCO2 22 - 32 mmol/L - - 23     Details            Capillary Blood  Glucose: Lab Results  Component Value Date   GLUCAP 131 (H) 04/02/2023   GLUCAP 148 (H) 04/02/2023   GLUCAP 157 (H) 03/28/2023   GLUCAP 139 (H) 03/28/2023   GLUCAP 99 03/28/2023     Exercise Target Goals: Exercise Program Goal: Individual exercise prescription set using results from initial 6 min walk test and THRR while considering  patient's activity barriers and safety.   Exercise Prescription Goal: Starting with aerobic activity 30 plus minutes a day, 3 days per week for initial exercise prescription. Provide home exercise prescription and guidelines that participant acknowledges understanding prior to discharge.  Activity Barriers & Risk Stratification:  Activity Barriers & Cardiac Risk Stratification - 03/20/23 0820       Activity Barriers & Cardiac Risk Stratification   Activity Barriers Arthritis;Shortness of Breath;Deconditioning;Decreased Ventricular Function;History of Falls;Assistive Device;Balance Concerns;Muscular Weakness    Cardiac Risk Stratification High             6 Minute Walk:  6 Minute Walk     Row Name 03/20/23 0945 06/02/23 0813       6 Minute Walk   Phase Initial Discharge    Distance 1348 feet 1525 feet  initial recalculated 1288 Ft    Distance % Change -- 18.4 %    Distance Feet Change -- 237 ft    Walk Time 6 minutes 6 minutes    # of Rest Breaks 0 0    MPH 2.55 2.89    METS 2.67 3.15    RPE 11 12    Perceived Dyspnea  2 --    VO2 Peak 9.33 11.04    Symptoms Yes (comment) No    Comments SOB --    Resting HR 71 bpm 68 bpm    Resting BP 104/58 122/62    Resting Oxygen Saturation  96 % --    Exercise Oxygen Saturation  during 6 min walk 93 % --    Max Ex. HR 105 bpm 114 bpm    Max Ex. BP 134/72 134/74    2 Minute Post BP 128/62 --             Oxygen Initial Assessment:   Oxygen Re-Evaluation:   Oxygen Discharge (Final Oxygen Re-Evaluation):   Initial Exercise Prescription:  Initial Exercise Prescription - 03/20/23  0900       Date of Initial Exercise RX and Referring Provider   Date 03/20/23    Referring Provider [name] MD      Oxygen   Maintain Oxygen Saturation 88% or higher      Treadmill   MPH 2  Grade 0.5    Minutes 15    METs 2.59      NuStep   Level 3    SPM 80    Minutes 15    METs 2.5      Prescription Details   Frequency (times per week) 3    Duration Progress to 30 minutes of continuous aerobic without signs/symptoms of physical distress      Intensity   THRR 40-80% of Max Heartrate 101-132    Ratings of Perceived Exertion 11-13    Perceived Dyspnea 0-4      Progression   Progression Continue to progress workloads to maintain intensity without signs/symptoms of physical distress.      Resistance Training   Training Prescription Yes    Weight 4 lb    Reps 10-15             Perform Capillary Blood Glucose checks as needed.  Exercise Prescription Changes:   Exercise Prescription Changes     Row Name 03/20/23 0900 03/28/23 1200 04/16/23 1300 04/21/23 0800 05/12/23 1300     Response to Exercise   Blood Pressure (Admit) 104/58 116/64 114/60 -- 98/48   Blood Pressure (Exercise) 134/72 126/64 124/64 -- --   Blood Pressure (Exit) 128/62 100/50 126/64 -- 100/52   Heart Rate (Admit) 71 bpm 66 bpm 75 bpm -- 60 bpm   Heart Rate (Exercise) 105 bpm 94 bpm 106 bpm -- 98 bpm   Heart Rate (Exit) 77 bpm 60 bpm 64 bpm -- 72 bpm   Oxygen Saturation (Admit) 96 % -- -- -- --   Oxygen Saturation (Exercise) 93 % -- -- -- --   Rating of Perceived Exertion (Exercise) 11 12 11  -- 13   Perceived Dyspnea (Exercise) 2 -- -- -- --   Symptoms SOB -- -- -- --   Comments walk test results -- -- -- --   Duration -- Continue with 30 min of aerobic exercise without signs/symptoms of physical distress. Continue with 30 min of aerobic exercise without signs/symptoms of physical distress. -- Continue with 30 min of aerobic exercise without signs/symptoms of physical  distress.   Intensity -- THRR unchanged THRR unchanged -- THRR unchanged     Progression   Progression -- Continue to progress workloads to maintain intensity without signs/symptoms of physical distress. Continue to progress workloads to maintain intensity without signs/symptoms of physical distress. -- Continue to progress workloads to maintain intensity without signs/symptoms of physical distress.     Resistance Training   Training Prescription -- Yes Yes -- Yes   Weight -- 4 4 -- 4   Reps -- 10-15 10-15 -- 10-15     Treadmill   MPH -- 2 2 -- 2   Grade -- 0.5 0.5 -- 0.5   Minutes -- 15 15 -- 15   METs -- 2.67 2.67 -- 2.67     NuStep   Level -- 3 3 -- 4   SPM -- 84 72 -- 77   Minutes -- 15 15 -- 15   METs -- 2.07 1.9 -- 2.2     Home Exercise Plan   Plans to continue exercise at -- -- -- Home (comment)  walking Home (comment)   Frequency -- -- -- Add 2 additional days to program exercise sessions. Add 2 additional days to program exercise sessions.   Initial Home Exercises Provided -- -- -- 04/21/23 --     Oxygen   Maintain Oxygen Saturation -- 88% or higher 88% or  higher -- 88% or higher    Row Name 05/28/23 1300             Response to Exercise   Blood Pressure (Admit) 100/50       Blood Pressure (Exit) 106/60       Heart Rate (Admit) 72 bpm       Heart Rate (Exercise) 107 bpm       Heart Rate (Exit) 76 bpm       Rating of Perceived Exertion (Exercise) 13       Duration Continue with 30 min of aerobic exercise without signs/symptoms of physical distress.       Intensity THRR unchanged         Progression   Progression Continue to progress workloads to maintain intensity without signs/symptoms of physical distress.         Resistance Training   Training Prescription Yes       Weight 4 lbs       Reps 10-15         Treadmill   MPH 2.1       Grade 1       Minutes 15       METs 2.9         NuStep   Level 4       SPM 81       Minutes 15       METs 2.4          Home Exercise Plan   Plans to continue exercise at Home (comment)       Frequency Add 2 additional days to program exercise sessions.         Oxygen   Maintain Oxygen Saturation 88% or higher                Exercise Comments:   Exercise Comments     Row Name 03/24/23 0818 04/16/23 1327 06/09/23 0757       Exercise Comments First full day of exercise!  Patient was oriented to gym and equipment including functions, settings, policies, and procedures.  Patient's individual exercise prescription and treatment plan were reviewed.  All starting workloads were established based on the results of the 6 minute walk test done at initial orientation visit.  The plan for exercise progression was also introduced and progression will be customized based on patient's performance and goals. [name] has Zebadiah graduated today from  rehab with 36 sessions completed.  Details of the patient's exercise prescription and what He needs to do in order to continue the prescription and progress were discussed with patient.  Patient was given a copy of prescription and goals.  Patient verbalized understanding. Caswell plans to continue to exercise by possibly attend maintenance class and home exercises.              Exercise Goals and Review:   Exercise Goals     Row Name 03/20/23 1006             Exercise Goals   Increase Physical Activity Yes       Intervention Provide advice, education, support and counseling about physical activity/exercise needs.;Develop an individualized exercise prescription for aerobic and resistive training based on initial evaluation findings, risk stratification, comorbidities and participant's personal goals.       Expected Outcomes Short Term: Attend rehab on a regular basis to increase amount of physical activity.;Long Term: Exercising regularly at least 3-5 days a week.;Long Term: Add in home exercise to make  exercise part of routine and to increase amount of physical  activity.       Increase Strength and Stamina Yes       Intervention Provide advice, education, support and counseling about physical activity/exercise needs.;Develop an individualized exercise prescription for aerobic and resistive training based on initial evaluation findings, risk stratification, comorbidities and participant's personal goals.       Expected Outcomes Short Term: Increase workloads from initial exercise prescription for resistance, speed, and METs.;Short Term: Perform resistance training exercises routinely during rehab and add in resistance training at home;Long Term: Improve cardiorespiratory fitness, muscular endurance and strength as measured by increased METs and functional capacity ( )       Able to understand and use rate of perceived exertion (RPE) scale Yes       Intervention Provide education and explanation on how to use RPE scale       Expected Outcomes Short Term: Able to use RPE daily in rehab to express subjective intensity level;Long Term:  Able to use RPE to guide intensity level when exercising independently       Able to understand and use Dyspnea scale Yes       Intervention Provide education and explanation on how to use Dyspnea scale       Expected Outcomes Short Term: Able to use Dyspnea scale daily in rehab to express subjective sense of shortness of breath during exertion;Long Term: Able to use Dyspnea scale to guide intensity level when exercising independently       Knowledge and understanding of Target Heart Rate Range (THRR) Yes       Intervention Provide education and explanation of THRR including how the numbers were predicted and where they are located for reference       Expected Outcomes Short Term: Able to state/look up THRR;Short Term: Able to use daily as guideline for intensity in rehab;Long Term: Able to use THRR to govern intensity when exercising independently       Able to check pulse independently Yes       Intervention Provide education  and demonstration on how to check pulse in carotid and radial arteries.;Review the importance of being able to check your own pulse for safety during independent exercise       Expected Outcomes Short Term: Able to explain why pulse checking is important during independent exercise;Long Term: Able to check pulse independently and accurately       Understanding of Exercise Prescription Yes       Intervention Provide education, explanation, and written materials on patient's individual exercise prescription       Expected Outcomes Short Term: Able to explain program exercise prescription;Long Term: Able to explain home exercise prescription to exercise independently                Exercise Goals Re-Evaluation :  Exercise Goals Re-Evaluation     Row Name 03/20/23 1006 04/01/23 1021 04/04/23 0812 04/16/23 1329 04/21/23 0807     Exercise Goal Re-Evaluation   Exercise Goals Review Able to understand and use Dyspnea scale;Understanding of Exercise Prescription;Able to understand and use rate of perceived exertion (RPE) scale -- Increase Physical Activity;Increase Strength and Stamina;Understanding of Exercise Prescription Increase Physical Activity;Increase Strength and Stamina;Understanding of Exercise Prescription Increase Physical Activity;Increase Strength and Stamina;Able to understand and use rate of perceived exertion (RPE) scale;Able to understand and use Dyspnea scale;Knowledge and understanding of Target Heart Rate Range (THRR);Able to check pulse independently;Understanding of Exercise Prescription   Comments Reviewed RPE  and dyspnea scale and program prescription with pt today. Pt voiced understanding and was given a copy of goals to take home. Pt has not increased his workloads yet in classs but is working at 12 on the RPE scale. He is still fairly new to the program and should start to increase workloads soon Gabriel Rung states that he still gets short winded when going up stairs. Exercising has  been helping build enduracne and he has noticed more strength in his legs since starting the program. [name] walks around his circle driveway most days during the evening due to heat. [name] has not increased his workload in the past two weeks he is still exercising at level 3 on the stepper with an RPE of 11 and his speed is still at 2 on the treadmill with an RPE 11. Will continue to monitor and progress as able. Gabriel Rung is doing well in rehab and feeling better overall.  Reviewed home exercise with pt today.  Pt plans to walk at home for exercise.  Reviewed THR, pulse, RPE, sign and symptoms, pulse oximetery and when to call 911 or MD.  Also discussed weather considerations and indoor options.  Pt voiced understanding.   Expected Outcomes Short: Use RPE daily to regulate intensity.  Long: Follow program prescription -- Short term: go over home exericse   long term: continue to exercise and attened rehab Short term goal: increase workload on the stepper and treadmill     long term goal: continue to attended cardiac rehab Short: Continue to walk more at home Long: Continue to improve stamina    Row Name 05/14/23 1300 05/19/23 0803 05/28/23 1337         Exercise Goal Re-Evaluation   Exercise Goals Review Increase Physical Activity;Increase Strength and Stamina;Understanding of Exercise Prescription;Able to understand and use rate of perceived exertion (RPE) scale Increase Physical Activity;Increase Strength and Stamina;Understanding of Exercise Prescription Increase Physical Activity;Increase Strength and Stamina;Understanding of Exercise Prescription     Comments Gabriel Rung has been doing good in rehab. He has increased his workload on the nustep by increasing his level to 4. He has been doing good on this level and RPE is 12. Will continue to monitor and progress as able. Gabriel Rung is doing well in rehab. He is feeling good overall and his strength and stamina have greatly improved.  He has walking in the driveway each day when  he goes out for mail.  He even walked down the road some yesterday. [name] is tolerating exercise well. He has increased his speed and grade on the treadmill to 2.1 speed and 1.0 grade. He has not increased his level on the NuStep but has high SPM 81. Will continue to monitor and progress as able.     Expected Outcomes Short term: increase treadmill speed in the nest two weeks  long term: continue to exercise in rehab and at home Short: Continue to walk on off days Long: conitnue to exercise independently Short term: increase level on NuStep next week   long term: continue to attend rehab               Discharge Exercise Prescription (Final Exercise Prescription Changes):  Exercise Prescription Changes - 05/28/23 1300       Response to Exercise   Blood Pressure (Admit) 100/50    Blood Pressure (Exit) 106/60    Heart Rate (Admit) 72 bpm    Heart Rate (Exercise) 107 bpm    Heart Rate (Exit) 76 bpm  Rating of Perceived Exertion (Exercise) 13    Duration Continue with 30 min of aerobic exercise without signs/symptoms of physical distress.    Intensity THRR unchanged      Progression   Progression Continue to progress workloads to maintain intensity without signs/symptoms of physical distress.      Resistance Training   Training Prescription Yes    Weight 4 lbs    Reps 10-15      Treadmill   MPH 2.1    Grade 1    Minutes 15    METs 2.9      NuStep   Level 4    SPM 81    Minutes 15    METs 2.4      Home Exercise Plan   Plans to continue exercise at Home (comment)    Frequency Add 2 additional days to program exercise sessions.      Oxygen   Maintain Oxygen Saturation 88% or higher             Nutrition:  Target Goals: Understanding of nutrition guidelines, daily intake of sodium 1500mg , cholesterol 200mg , calories 30% from fat and 7% or less from saturated fats, daily to have 5 or more servings of fruits and vegetables.  Biometrics:  Pre Biometrics - 03/20/23  1008       Pre Biometrics   Height 5\' 11"  (1.803 m)    Weight 103.8 kg    Waist Circumference 44 inches    Hip Circumference 39 inches    Waist to Hip Ratio 1.13 %    BMI (Calculated) 31.94    Triceps Skinfold 10 mm    % Body Fat 29.2 %    Grip Strength 10.9 kg    Single Leg Stand 0.8 seconds             Post Biometrics - 06/02/23 0815        Post  Biometrics   Height 5\' 11"  (1.803 m)    Weight 99.3 kg    Waist Circumference 41 inches    Hip Circumference 39 inches    Waist to Hip Ratio 1.05 %    BMI (Calculated) 30.56    Grip Strength 27.6 kg    Single Leg Stand 2.1 seconds             Nutrition Therapy Plan and Nutrition Goals:  Nutrition Therapy & Goals - 03/20/23 0853       Personal Nutrition Goals   Comments Handout provided and explained regarding healthier choices.      Intervention Plan   Intervention Nutrition handout(s) given to patient.    Expected Outcomes Short Term Goal: Understand basic principles of dietary content, such as calories, fat, sodium, cholesterol and nutrients.             Nutrition Assessments:  Nutrition Assessments - 06/09/23 0823       MEDFICTS Scores   Pre Score 21    Post Score 18    Score Difference -3            MEDIFICTS Score Key: >=70 Need to make dietary changes  40-70 Heart Healthy Diet <= 40 Therapeutic Level Cholesterol Diet   Picture Your Plate Scores: <54 Unhealthy dietary pattern with much room for improvement. 41-50 Dietary pattern unlikely to meet recommendations for good health and room for improvement. 51-60 More healthful dietary pattern, with some room for improvement.  >60 Healthy dietary pattern, although there may be some specific behaviors that could be improved.  Nutrition Goals Re-Evaluation:  Nutrition Goals Re-Evaluation     Row Name 04/04/23 [phone] 04/21/23 0813 05/19/23 0807         Goals   Nutrition Goal Healthy eating Short term: find healthy snack options other  then popcorn long term: Continue to eat healthy food options Short: Continue to work on healthy snacks Long: Continue to eat healthy     Comment Has started to eat more for breakfest since starting rehab to help control blood suger. He eats less red meat and eats more chicken and broild pork. He has cut down on his portion size. He no longer eats TV/precooked dinners. He eats popcorn to help cut cravings when needing a snack. Gabriel Rung is doing well in rehab.  He was eating popcorn at night.  He is still drinking buttermilk, but doctor said it was okay.  He likes cooked carrots.  He has been working on portion control.  He is now sticking to jsut one serving and even his wife has said something to him about it. Gabriel Rung is doing well in rehab.  He is still eating salads every day to get his vegetables in.  He is getting tired of eating them.  He wants to try to switch up his salad dressing some to mix it up.  He did go out for [place] chicken yesterday and really enjoyed it.  He is doing well with his portion control     Expected Outcome Short term: find healthy snack options other then popcorn   long term: Continue to eat healthy food options Short: Continue to work on healthy snacks Long: Continue to eat healthy Short: Try new salad dressing Long: Continue to focus on heart healthy eating              Nutrition Goals Discharge (Final Nutrition Goals Re-Evaluation):  Nutrition Goals Re-Evaluation - 05/19/23 0807       Goals   Nutrition Goal Short: Continue to work on healthy snacks Long: Continue to eat healthy    Comment Gabriel Rung is doing well in rehab.  He is still eating salads every day to get his vegetables in.  He is getting tired of eating them.  He wants to try to switch up his salad dressing some to mix it up.  He did go out for [place] chicken yesterday and really enjoyed it.  He is doing well with his portion control    Expected Outcome Short: Try new salad dressing Long: Continue to focus on heart healthy  eating             Psychosocial: Target Goals: Acknowledge presence or absence of significant depression and/or stress, maximize coping skills, provide positive support system. Participant is able to verbalize types and ability to use techniques and skills needed for reducing stress and depression.  Initial Review & Psychosocial Screening:  Initial Psych Review & Screening - 03/20/23 0855       Initial Review   Current issues with Current Anxiety/Panic      Family Dynamics   Good Support System? Yes    Comments Patient's wife and sister are his support people.      Barriers   Psychosocial barriers to participate in program The patient should benefit from training in stress management and relaxation.;There are no identifiable barriers or psychosocial needs.      Screening Interventions   Interventions Encouraged to exercise;To provide support and resources with identified psychosocial needs;Provide feedback about the scores to participant    Expected  Outcomes Short Term goal: Utilizing psychosocial counselor, staff and physician to assist with identification of specific Stressors or current issues interfering with healing process. Setting desired goal for each stressor or current issue identified.;Long Term Goal: Stressors or current issues are controlled or eliminated.;Short Term goal: Identification and review with participant of any Quality of Life or Depression concerns found by scoring the questionnaire.;Long Term goal: The participant improves quality of Life and PHQ9 Scores as seen by post scores and/or verbalization of changes             Quality of Life Scores:  Quality of Life - 06/09/23 0822       Quality of Life   Select Quality of Life      Quality of Life Scores   Health/Function Pre 16 %    Health/Function Post 19.9 %    Health/Function % Change 24.38 %    Socioeconomic Pre 21.93 %    Socioeconomic Post 15.17 %    Socioeconomic % Change  -30.83 %     Psych/Spiritual Pre 16.64 %    Psych/Spiritual Post 15.43 %    Psych/Spiritual % Change -7.27 %    Family Pre 20.6 %    Family Post 20.88 %    Family % Change 1.36 %    GLOBAL Pre 18.03 %    GLOBAL Post 18.16 %    GLOBAL % Change 0.72 %            Scores of 19 and below usually indicate a poorer quality of life in these areas.  A difference of  2-3 points is a clinically meaningful difference.  A difference of 2-3 points in the total score of the Quality of Life Index has been associated with significant improvement in overall quality of life, self-image, physical symptoms, and general health in studies assessing change in quality of life.  PHQ-9: Review Flowsheet       06/09/2023 03/20/2023  Depression screen PHQ 2/9  Decreased Interest 1 1  Down, Depressed, Hopeless 1 1  PHQ - 2 Score 2 2  Altered sleeping 0 0  Tired, decreased energy 1 2  Change in appetite 0 0  Feeling bad or failure about yourself  0 0  Trouble concentrating 0 0  Moving slowly or fidgety/restless 1 0  Suicidal thoughts 0 0  PHQ-9 Score 4 4  Difficult doing work/chores Not difficult at all Somewhat difficult    Details           Interpretation of Total Score  Total Score Depression Severity:  1-4 = Minimal depression, 5-9 = Mild depression, 10-14 = Moderate depression, 15-19 = Moderately severe depression, 20-27 = Severe depression   Psychosocial Evaluation and Intervention:  Psychosocial Evaluation - 03/20/23 0917       Psychosocial Evaluation & Interventions   Interventions Stress management education;Relaxation education;Encouraged to exercise with the program and follow exercise prescription    Comments Patient was referred to CR with STEMI/Stent placement and PTCA. 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 He is wearing a life vest for VT/VF. Patient is not aware why he is  having to wear the vest. It has alarmed one time since he has been wearing. His PHQ-9 score was 4 due to feeling down and frustrutated with not being able to do the things he wants to do. He does have anxiety treated  with Xanax at bedtime as needed. He feels his anxiety is managed. He is retired from the [employer]. He lives with his wife who does support him but she is in poor health. He says his main support person is his sister. He has a daughter and a grandchild and is expecting his first great grandchild soon. He has no barriers identified to participate in the program. His main goals are to build his strength and stamina; to feel better again overall and get back to doing his normal activities.    Expected Outcomes Patient will continue to have no barriers identified and his anxiety will continue to be managed.    Continue Psychosocial Services  Follow up required by staff             Psychosocial Re-Evaluation:  Psychosocial Re-Evaluation     Row Name 04/04/23 [phone] 04/21/23 0811 05/19/23 0805         Psychosocial Re-Evaluation   Current issues with None Identified -- None Identified     Comments Only stressor is his defib vest that he is ready to have off. States that he is very happy with everything in his life and has enjoyed coming to rehab to socialize and improve health [name] is doing well in rehab.  He says that he is doing well in rehab and feeling better.  He is feeling good mentally and denies and major stressors.  He continues to sleep well and wakes daily at 4am. Gabriel Rung is doing well in rehab. He continues to feel good mentally.  He denies any major stressors and continues to sleep well.  He has enjoyed exercising and getting his strength back.  He looks forward to coming to class to get out of the house.     Expected Outcomes Short term: continue to exercise to promote happy     long term: continue to identify stressor and life a happy life Short: Continue to  exercise for mental boost Long: Conitnue to stay positive Short: Continue to exercise to attend class and get mental boost Long: Continue to stay positive and enjoy classes     Interventions Stress management education;Relaxation education;Encouraged to attend Cardiac Rehabilitation for the exercise Encouraged to attend Cardiac Rehabilitation for the exercise;Stress management education Encouraged to attend Cardiac Rehabilitation for the exercise     Continue Psychosocial Services  Follow up required by staff Follow up required by staff Follow up required by staff              Psychosocial Discharge (Final Psychosocial Re-Evaluation):  Psychosocial Re-Evaluation - 05/19/23 0805       Psychosocial Re-Evaluation   Current issues with None Identified    Comments Gabriel Rung is doing well in rehab. He continues to feel good mentally.  He denies any major stressors and continues to sleep well.  He has enjoyed exercising and getting his strength back.  He looks forward to coming to class to get out of the house.    Expected Outcomes Short: Continue to exercise to attend class and get mental boost Long: Continue to stay positive and enjoy classes    Interventions Encouraged to attend Cardiac Rehabilitation for the exercise    Continue Psychosocial Services  Follow up required by staff             Vocational Rehabilitation: Provide vocational rehab assistance to qualifying candidates.   Vocational Rehab Evaluation & Intervention:  Vocational Rehab - 03/20/23 9604       Initial Vocational  Rehab Evaluation & Intervention   Assessment shows need for Vocational Rehabilitation No      Vocational Rehab Re-Evaulation   Comments Patient is retired.             Education: Education Goals: Education classes will be provided on a weekly basis, covering required topics. Participant will state understanding/return demonstration of topics presented.  Learning Barriers/Preferences:  Learning  Barriers/Preferences - 03/20/23 0854       Learning Barriers/Preferences   Learning Barriers None    Learning Preferences Skilled Demonstration;Audio             Education Topics: Hypertension, Hypertension Reduction -Define heart disease and high blood pressure. Discus how high blood pressure affects the body and ways to reduce high blood pressure.   Exercise and Your Heart -Discuss why it is important to exercise, the FITT principles of exercise, normal and abnormal responses to exercise, and how to exercise safely. Flowsheet Row CARDIAC REHAB PHASE II EXERCISE from 05/28/2023 in [place] CARDIAC REHABILITATION  Date 05/28/23  Educator [facility]  Instruction Review Code 1- Verbalizes Understanding       Angina -Discuss definition of angina, causes of angina, treatment of angina, and how to decrease risk of having angina. Flowsheet Row CARDIAC REHAB PHASE II EXERCISE from 05/28/2023 in [place] CARDIAC REHABILITATION  Date 05/21/23  Educator [facility]  Instruction Review Code 1- Verbalizes Understanding  [test and procedures]       Cardiac Medications -Review what the following cardiac medications are used for, how they affect the body, and side effects that may occur when taking the medications.  Medications include Aspirin, Beta blockers, calcium channel blockers, ACE Inhibitors, angiotensin receptor blockers, diuretics, digoxin, and antihyperlipidemics. Flowsheet Row CARDIAC REHAB PHASE II EXERCISE from 05/28/2023 in [place] CARDIAC REHABILITATION  Date 03/26/23  Educator DJ  Instruction Review Code 1- Verbalizes Understanding       Congestive Heart Failure -Discuss the definition of CHF, how to live with CHF, the signs and symptoms of CHF, and how keep track of weight and sodium intake. Flowsheet Row CARDIAC REHAB PHASE II EXERCISE from 05/28/2023 in [place] CARDIAC REHABILITATION  Date 04/02/23  Educator [facility]  Instruction Review Code 1- Verbalizes Understanding        Heart Disease and Intimacy -Discus the effect sexual activity has on the heart, how changes occur during intimacy as we age, and safety during sexual activity. Flowsheet Row CARDIAC REHAB PHASE II EXERCISE from 05/28/2023 in [place] CARDIAC REHABILITATION  Date 04/09/23  Educator [facility]  Instruction Review Code 1- Verbalizes Understanding       Smoking Cessation / COPD -Discuss different methods to quit smoking, the health benefits of quitting smoking, and the definition of COPD. Flowsheet Row CARDIAC REHAB PHASE II EXERCISE from 05/28/2023 in [place] CARDIAC REHABILITATION  Date 05/07/23  Educator [facility]  Instruction Review Code 2- Demonstrated Understanding       Nutrition I: Fats -Discuss the types of cholesterol, what cholesterol does to the heart, and how cholesterol levels can be controlled. Flowsheet Row CARDIAC REHAB PHASE II EXERCISE from 05/28/2023 in [place] CARDIAC REHABILITATION  Date 04/16/23  Educator [facility]  Instruction Review Code 1- Verbalizes Understanding       Nutrition II: Labels -Discuss the different components of food labels and how to read food label Flowsheet Row CARDIAC REHAB PHASE II EXERCISE from 05/28/2023 in [place] CARDIAC REHABILITATION  Date 04/16/23  Educator [facility]  Instruction Review Code 1- Verbalizes Understanding  Heart Parts/Heart Disease and PAD -Discuss the anatomy of the heart, the pathway of blood circulation through the heart, and these are affected by heart disease.   Stress I: Signs and Symptoms -Discuss the causes of stress, how stress may lead to anxiety and depression, and ways to limit stress. Flowsheet Row CARDIAC REHAB PHASE II EXERCISE from 05/28/2023 in [place] CARDIAC REHABILITATION  Date 04/23/23  Educator [facility]  Instruction Review Code 2- Demonstrated Understanding       Stress II: Relaxation -Discuss different types of relaxation techniques to limit stress. Flowsheet Row CARDIAC REHAB PHASE II  EXERCISE from 05/28/2023 in [place] CARDIAC REHABILITATION  Date 04/23/23  Educator [facility]  Instruction Review Code 2- Demonstrated Understanding       Warning Signs of Stroke / TIA -Discuss definition of a stroke, what the signs and symptoms are of a stroke, and how to identify when someone is having stroke.   Knowledge Questionnaire Score:  Knowledge Questionnaire Score - 06/09/23 0823       Knowledge Questionnaire Score   Pre Score 19/24    Post Score 20/24             Core Components/Risk Factors/Patient Goals at Admission:  Personal Goals and Risk Factors at Admission - 03/20/23 0854       Core Components/Risk Factors/Patient Goals on Admission    Weight Management Yes;Weight Loss;Obesity    Intervention Weight Management: Develop a combined nutrition and exercise program designed to reach desired caloric intake, while maintaining appropriate intake of nutrient and fiber, sodium and fats, and appropriate energy expenditure required for the weight goal.;Weight Management: Provide education and appropriate resources to help participant work on and attain dietary goals.;Weight Management/Obesity: Establish reasonable short term and long term weight goals.;Obesity: Provide education and appropriate resources to help participant work on and attain dietary goals.    Admit Weight 228 lb 14.4 oz (103.8 kg)    Goal Weight: Short Term 223 lb (101.2 kg)    Goal Weight: Long Term 218 lb (98.9 kg)    Expected Outcomes Short Term: Continue to assess and modify interventions until short term weight is achieved;Long Term: Adherence to nutrition and physical activity/exercise program aimed toward attainment of established weight goal;Weight Loss: Understanding of general recommendations for a balanced deficit meal plan, which promotes 1-2 lb weight loss per week and includes a negative energy balance of [phone] kcal/d;Understanding recommendations for meals to include 15-35% energy as protein,  25-35% energy from fat, 35-60% energy from carbohydrates, less than 200mg  of dietary cholesterol, 20-35 gm of total fiber daily;Understanding of distribution of calorie intake throughout the day with the consumption of 4-5 meals/snacks    Improve shortness of breath with ADL's Yes    Intervention Provide education, individualized exercise plan and daily activity instruction to help decrease symptoms of SOB with activities of daily living.    Expected Outcomes Short Term: Improve cardiorespiratory fitness to achieve a reduction of symptoms when performing ADLs;Long Term: Be able to perform more ADLs without symptoms or delay the onset of symptoms    Diabetes Yes    Intervention Provide education about signs/symptoms and action to take for hypo/hyperglycemia.;Provide education about proper nutrition, including hydration, and aerobic/resistive exercise prescription along with prescribed medications to achieve blood glucose in normal ranges: Fasting glucose 65-99 mg/dL    Expected Outcomes Short Term: Participant verbalizes understanding of the signs/symptoms and immediate care of hyper/hypoglycemia, proper foot care and importance of medication, aerobic/resistive exercise and nutrition plan for blood  glucose control.;Long Term: Attainment of HbA1C < 7%.    Heart Failure Yes    Intervention Provide a combined exercise and nutrition program that is supplemented with education, support and counseling about heart failure. Directed toward relieving symptoms such as shortness of breath, decreased exercise tolerance, and extremity edema.    Expected Outcomes Short term: Attendance in program 2-3 days a week with increased exercise capacity. Reported lower sodium intake. Reported increased fruit and vegetable intake. Reports medication compliance.;Short term: Daily weights obtained and reported for increase. Utilizing diuretic protocols set by physician.;Long term: Adoption of self-care skills and reduction of  barriers for early signs and symptoms recognition and intervention leading to self-care maintenance.    Hypertension Yes    Intervention Provide education on lifestyle modifcations including regular physical activity/exercise, weight management, moderate sodium restriction and increased consumption of fresh fruit, vegetables, and low fat dairy, alcohol moderation, and smoking cessation.;Monitor prescription use compliance.    Expected Outcomes Short Term: Continued assessment and intervention until BP is < 140/71mm HG in hypertensive participants. < 130/39mm HG in hypertensive participants with diabetes, heart failure or chronic kidney disease.;Long Term: Maintenance of blood pressure at goal levels.    Lipids Yes    Intervention Provide education and support for participant on nutrition & aerobic/resistive exercise along with prescribed medications to achieve LDL 70mg , HDL >40mg .    Expected Outcomes Short Term: Participant states understanding of desired cholesterol values and is compliant with medications prescribed. Participant is following exercise prescription and nutrition guidelines.;Long Term: Cholesterol controlled with medications as prescribed, with individualized exercise RX and with personalized nutrition plan. Value goals: LDL < 70mg , HDL > 40 mg.             Core Components/Risk Factors/Patient Goals Review:   Goals and Risk Factor Review     Row Name 04/04/23 0828 04/21/23 0818 05/19/23 0809         Core Components/Risk Factors/Patient Goals Review   Personal Goals Review Diabetes;Weight Management/Obesity Diabetes;Weight Management/Obesity;Hypertension;Heart Failure Diabetes;Weight Management/Obesity;Hypertension;Heart Failure     Review [name] states that he wants to stay healthy and be ale to live as long as he can for his grandchilderen. He has been watching what he eats to help manage his diabetes. HE eats more in the morning before rehab due to his CBG being low (70s) in  the morining when getting to rehab. Gabriel Rung is doing well in rehab. His weight is trending down.  His blood sugars are doing better, he has noticed a big differnce since eating a peanut butter protein bar in the morning.  His blood pressures are doing well in rehab but he is not checking it at home usually.  He will check if it is high as it will buzz in his head.  He has noticed any heart failure symptoms and says his fluid pills are working well for him.  He is feeling good overall. Gabriel Rung is doing well in rehab. His weight is steady between 219-222 lb.  His sugars continue to be good and doing well coming off meds some.  His pressures are doing well.  He has not had any heart failure symptoms recently and feeling good overall.     Expected Outcomes Short term: continue to exercise and manage diabetes  long term: Continue to attend rehab Short: conitnue to work on weight loss and keep eye on sugars Long: continue to monitor risk factors Short: Continue to work on weight loss Long: Conitnue to monitor risk factors  Core Components/Risk Factors/Patient Goals at Discharge (Final Review):   Goals and Risk Factor Review - 05/19/23 0809       Core Components/Risk Factors/Patient Goals Review   Personal Goals Review Diabetes;Weight Management/Obesity;Hypertension;Heart Failure    Review Gabriel Rung is doing well in rehab. His weight is steady between 219-222 lb.  His sugars continue to be good and doing well coming off meds some.  His pressures are doing well.  He has not had any heart failure symptoms recently and feeling good overall.    Expected Outcomes Short: Continue to work on weight loss Long: Conitnue to monitor risk factors             ITP Comments:  ITP Comments     Row Name 03/20/23 [id number] 03/24/23 0818 03/28/23 0837 04/16/23 0759 05/14/23 0753   ITP Comments Patient arrived for 1st visit/orientation/education at 0800. Patient was referred to CR by Dr. Izora Ribas due to STEMI/Stent  Placement/PTCA. During orientation advised patient on arrival and appointment times what to wear, what to do before, during and after exercise. Reviewed attendance and class policy.  Pt is scheduled to return Cardiac Rehab on 03/24/23 at 0815. Pt was advised to come to class 15 minutes before class starts.  Discussed RPE/Dpysnea scales. Patient participated in warm up stretches. Patient was able to complete 6 minute walk test.  Telemetry Ventricular paced rhythm. Patient was measured for the equipment. Discussed equipment safety with patient. Took patient pre-anthropometric measurements. Patient finished visit at 0920. . First full day of exercise!  Patient was oriented to gym and equipment including functions, settings, policies, and procedures.  Patient's individual exercise prescription and treatment plan were reviewed.  All starting workloads were established based on the results of the 6 minute walk test done at initial orientation visit.  The plan for exercise progression was also introduced and progression will be customized based on patient's performance and goals. [name] started day with a low blood sugar and needed two OJ to get it above 110.  Once above, he was able to exercise. 30 day review completed. ITP sent to Dr. Dina Rich, Medical Director of Cardiac Rehab. Continue with ITP unless changes are made by physician. 30 day review completed. ITP sent to Dr. Dina Rich, Medical Director of Cardiac Rehab. Continue with ITP unless changes are made by physician.    Row Name 06/09/23 0756           ITP Comments Nasir graduated today from  rehab with 36 sessions completed.  Details of the patient's exercise prescription and what He needs to do in order to continue the prescription and progress were discussed with patient.  Patient was given a copy of prescription and goals.  Patient verbalized understanding. Onofre plans to continue to exercise by possibly attend maintenance class and home  exercises.                Comments: Discharge ITP

## 2023-06-09 NOTE — Progress Notes (Addendum)
Discharge Progress Report  Patient Details  Name: Douglas Mason MRN: [id number] Date of Birth: Jun 01, 1951 Referring Provider:   Flowsheet Row CARDIAC REHAB PHASE II ORIENTATION from 03/20/2023 in [facility] CARDIAC REHABILITATION  Referring Provider [name] MD        Number of Visits: 36  Reason for Discharge:  Patient reached a stable level of exercise. Patient independent in their exercise. Patient has met program and personal goals.  Smoking History:  Social History   Tobacco Use  Smoking Status Former   Current packs/day: 0.00   Types: Cigarettes   Quit date: 09/02/1976   Years since quitting: 46.7  Smokeless Tobacco Former   Types: Chew   Quit date: 09/03/2003    Diagnosis:  Status post coronary artery stent placement  ST elevation myocardial infarction (STEMI), unspecified artery (HCC)  Postsurgical percutaneous transluminal coronary angioplasty (PTCA) status  Initial Exercise Prescription:  Initial Exercise Prescription - 03/20/23 0900       Date of Initial Exercise RX and Referring Provider   Date 03/20/23    Referring Provider [name] MD      Oxygen   Maintain Oxygen Saturation 88% or higher      Treadmill   MPH 2    Grade 0.5    Minutes 15    METs 2.59      NuStep   Level 3    SPM 80    Minutes 15    METs 2.5      Prescription Details   Frequency (times per week) 3    Duration Progress to 30 minutes of continuous aerobic without signs/symptoms of physical distress      Intensity   THRR 40-80% of Max Heartrate 101-132    Ratings of Perceived Exertion 11-13    Perceived Dyspnea 0-4      Progression   Progression Continue to progress workloads to maintain intensity without signs/symptoms of physical distress.      Resistance Training   Training Prescription Yes    Weight 4 lb    Reps 10-15             Discharge Exercise Prescription (Final Exercise Prescription Changes):  Exercise Prescription  Changes - 05/28/23 1300       Response to Exercise   Blood Pressure (Admit) 100/50    Blood Pressure (Exit) 106/60    Heart Rate (Admit) 72 bpm    Heart Rate (Exercise) 107 bpm    Heart Rate (Exit) 76 bpm    Rating of Perceived Exertion (Exercise) 13    Duration Continue with 30 min of aerobic exercise without signs/symptoms of physical distress.    Intensity THRR unchanged      Progression   Progression Continue to progress workloads to maintain intensity without signs/symptoms of physical distress.      Resistance Training   Training Prescription Yes    Weight 4 lbs    Reps 10-15      Treadmill   MPH 2.1    Grade 1    Minutes 15    METs 2.9      NuStep   Level 4    SPM 81    Minutes 15    METs 2.4      Home Exercise Plan   Plans to continue exercise at Home (comment)    Frequency Add 2 additional days to program exercise sessions.      Oxygen   Maintain Oxygen Saturation 88% or higher  Functional Capacity:  6 Minute Walk     Row Name 03/20/23 0945 06/02/23 0813       6 Minute Walk   Phase Initial Discharge    Distance 1348 feet 1525 feet  initial recalculated 1288 Ft    Distance % Change -- 18.4 %    Distance Feet Change -- 237 ft    Walk Time 6 minutes 6 minutes    # of Rest Breaks 0 0    MPH 2.55 2.89    METS 2.67 3.15    RPE 11 12    Perceived Dyspnea  2 --    VO2 Peak 9.33 11.04    Symptoms Yes (comment) No    Comments SOB --    Resting HR 71 bpm 68 bpm    Resting BP 104/58 122/62    Resting Oxygen Saturation  96 % --    Exercise Oxygen Saturation  during 6 min walk 93 % --    Max Ex. HR 105 bpm 114 bpm    Max Ex. BP 134/72 134/74    2 Minute Post BP 128/62 --             Psychological, QOL, Others - Outcomes: PHQ 2/9:    06/09/2023    8:24 AM 03/20/2023    8:52 AM  Depression screen PHQ 2/9  Decreased Interest 1 1  Down, Depressed, Hopeless 1 1  PHQ - 2 Score 2 2  Altered sleeping 0 0  Tired, decreased energy 1  2  Change in appetite 0 0  Feeling bad or failure about yourself  0 0  Trouble concentrating 0 0  Moving slowly or fidgety/restless 1 0  Suicidal thoughts 0 0  PHQ-9 Score 4 4  Difficult doing work/chores Not difficult at all Somewhat difficult    Quality of Life:  Quality of Life - 06/09/23 0822       Quality of Life   Select Quality of Life      Quality of Life Scores   Health/Function Pre 16 %    Health/Function Post 19.9 %    Health/Function % Change 24.38 %    Socioeconomic Pre 21.93 %    Socioeconomic Post 15.17 %    Socioeconomic % Change  -30.83 %    Psych/Spiritual Pre 16.64 %    Psych/Spiritual Post 15.43 %    Psych/Spiritual % Change -7.27 %    Family Pre 20.6 %    Family Post 20.88 %    Family % Change 1.36 %    GLOBAL Pre 18.03 %    GLOBAL Post 18.16 %    GLOBAL % Change 0.72 %           Nutrition & Weight - Outcomes:  Pre Biometrics - 03/20/23 1008       Pre Biometrics   Height 5\' 11"  (1.803 m)    Weight 103.8 kg    Waist Circumference 44 inches    Hip Circumference 39 inches    Waist to Hip Ratio 1.13 %    BMI (Calculated) 31.94    Triceps Skinfold 10 mm    % Body Fat 29.2 %    Grip Strength 10.9 kg    Single Leg Stand 0.8 seconds             Post Biometrics - 06/02/23 0815        Post  Biometrics   Height 5\' 11"  (1.803 m)    Weight 99.3 kg  Waist Circumference 41 inches    Hip Circumference 39 inches    Waist to Hip Ratio 1.05 %    BMI (Calculated) 30.56    Grip Strength 27.6 kg    Single Leg Stand 2.1 seconds

## 2023-06-11 ENCOUNTER — Encounter (HOSPITAL_COMMUNITY): Payer: Medicare Other

## 2023-06-13 ENCOUNTER — Encounter (HOSPITAL_COMMUNITY): Payer: Medicare Other

## 2023-06-16 ENCOUNTER — Encounter (HOSPITAL_COMMUNITY): Payer: Medicare Other

## 2023-06-18 ENCOUNTER — Encounter (HOSPITAL_COMMUNITY): Payer: Medicare Other

## 2023-06-19 ENCOUNTER — Ambulatory Visit: Payer: Medicare Other | Admitting: Nurse Practitioner

## 2023-06-20 ENCOUNTER — Encounter (HOSPITAL_COMMUNITY): Payer: Medicare Other

## 2023-06-23 ENCOUNTER — Encounter (HOSPITAL_COMMUNITY): Payer: Medicare Other

## 2023-07-04 ENCOUNTER — Other Ambulatory Visit (HOSPITAL_COMMUNITY): Payer: Self-pay

## 2023-07-07 ENCOUNTER — Other Ambulatory Visit: Payer: Self-pay

## 2023-07-18 ENCOUNTER — Other Ambulatory Visit (HOSPITAL_COMMUNITY): Payer: Self-pay

## 2023-07-22 DIAGNOSIS — E782 Mixed hyperlipidemia: Secondary | ICD-10-CM | POA: Diagnosis not present

## 2023-07-22 DIAGNOSIS — I10 Essential (primary) hypertension: Secondary | ICD-10-CM | POA: Diagnosis not present

## 2023-07-22 DIAGNOSIS — E1169 Type 2 diabetes mellitus with other specified complication: Secondary | ICD-10-CM | POA: Diagnosis not present

## 2023-07-22 DIAGNOSIS — E1369 Other specified diabetes mellitus with other specified complication: Secondary | ICD-10-CM | POA: Diagnosis not present

## 2023-07-24 ENCOUNTER — Other Ambulatory Visit (HOSPITAL_COMMUNITY): Payer: Self-pay

## 2023-07-29 ENCOUNTER — Encounter: Payer: Self-pay | Admitting: Internal Medicine

## 2023-07-29 DIAGNOSIS — F419 Anxiety disorder, unspecified: Secondary | ICD-10-CM | POA: Diagnosis not present

## 2023-07-29 DIAGNOSIS — R600 Localized edema: Secondary | ICD-10-CM | POA: Diagnosis not present

## 2023-07-29 DIAGNOSIS — I10 Essential (primary) hypertension: Secondary | ICD-10-CM | POA: Diagnosis not present

## 2023-07-29 DIAGNOSIS — R251 Tremor, unspecified: Secondary | ICD-10-CM | POA: Diagnosis not present

## 2023-07-29 DIAGNOSIS — I110 Hypertensive heart disease with heart failure: Secondary | ICD-10-CM | POA: Diagnosis not present

## 2023-07-29 DIAGNOSIS — I509 Heart failure, unspecified: Secondary | ICD-10-CM | POA: Diagnosis not present

## 2023-07-29 DIAGNOSIS — E782 Mixed hyperlipidemia: Secondary | ICD-10-CM | POA: Diagnosis not present

## 2023-07-29 DIAGNOSIS — Z23 Encounter for immunization: Secondary | ICD-10-CM | POA: Diagnosis not present

## 2023-07-29 DIAGNOSIS — E1369 Other specified diabetes mellitus with other specified complication: Secondary | ICD-10-CM | POA: Diagnosis not present

## 2023-07-29 DIAGNOSIS — K754 Autoimmune hepatitis: Secondary | ICD-10-CM | POA: Diagnosis not present

## 2023-07-29 DIAGNOSIS — K219 Gastro-esophageal reflux disease without esophagitis: Secondary | ICD-10-CM | POA: Diagnosis not present

## 2023-07-29 DIAGNOSIS — I2510 Atherosclerotic heart disease of native coronary artery without angina pectoris: Secondary | ICD-10-CM | POA: Diagnosis not present

## 2023-08-25 ENCOUNTER — Other Ambulatory Visit: Payer: Self-pay

## 2023-08-25 ENCOUNTER — Other Ambulatory Visit (HOSPITAL_COMMUNITY): Payer: Self-pay

## 2023-08-26 ENCOUNTER — Other Ambulatory Visit: Payer: Self-pay

## 2023-09-12 ENCOUNTER — Other Ambulatory Visit (HOSPITAL_COMMUNITY): Payer: Self-pay

## 2023-09-30 ENCOUNTER — Other Ambulatory Visit (HOSPITAL_COMMUNITY): Payer: Self-pay

## 2023-10-01 ENCOUNTER — Other Ambulatory Visit (HOSPITAL_COMMUNITY): Payer: Self-pay

## 2023-11-01 ENCOUNTER — Other Ambulatory Visit (HOSPITAL_COMMUNITY): Payer: Self-pay

## 2023-11-03 ENCOUNTER — Other Ambulatory Visit (HOSPITAL_COMMUNITY): Payer: Self-pay

## 2023-11-19 DIAGNOSIS — E1369 Other specified diabetes mellitus with other specified complication: Secondary | ICD-10-CM | POA: Diagnosis not present

## 2023-11-19 DIAGNOSIS — E782 Mixed hyperlipidemia: Secondary | ICD-10-CM | POA: Diagnosis not present

## 2023-11-19 DIAGNOSIS — I10 Essential (primary) hypertension: Secondary | ICD-10-CM | POA: Diagnosis not present

## 2023-11-24 ENCOUNTER — Other Ambulatory Visit (HOSPITAL_COMMUNITY): Payer: Self-pay

## 2023-11-25 ENCOUNTER — Other Ambulatory Visit (HOSPITAL_COMMUNITY): Payer: Self-pay

## 2023-11-25 ENCOUNTER — Other Ambulatory Visit: Payer: Self-pay

## 2023-11-25 ENCOUNTER — Other Ambulatory Visit (HOSPITAL_BASED_OUTPATIENT_CLINIC_OR_DEPARTMENT_OTHER): Payer: Self-pay

## 2023-11-25 DIAGNOSIS — I509 Heart failure, unspecified: Secondary | ICD-10-CM | POA: Diagnosis not present

## 2023-11-25 DIAGNOSIS — R251 Tremor, unspecified: Secondary | ICD-10-CM | POA: Diagnosis not present

## 2023-11-25 DIAGNOSIS — I110 Hypertensive heart disease with heart failure: Secondary | ICD-10-CM | POA: Diagnosis not present

## 2023-11-25 DIAGNOSIS — R609 Edema, unspecified: Secondary | ICD-10-CM | POA: Diagnosis not present

## 2023-11-25 DIAGNOSIS — K219 Gastro-esophageal reflux disease without esophagitis: Secondary | ICD-10-CM | POA: Diagnosis not present

## 2023-11-25 DIAGNOSIS — I10 Essential (primary) hypertension: Secondary | ICD-10-CM | POA: Diagnosis not present

## 2023-11-25 DIAGNOSIS — E119 Type 2 diabetes mellitus without complications: Secondary | ICD-10-CM | POA: Diagnosis not present

## 2023-11-25 DIAGNOSIS — G629 Polyneuropathy, unspecified: Secondary | ICD-10-CM | POA: Diagnosis not present

## 2023-11-25 DIAGNOSIS — F419 Anxiety disorder, unspecified: Secondary | ICD-10-CM | POA: Diagnosis not present

## 2023-11-25 DIAGNOSIS — I77810 Thoracic aortic ectasia: Secondary | ICD-10-CM | POA: Diagnosis not present

## 2023-11-25 DIAGNOSIS — Z794 Long term (current) use of insulin: Secondary | ICD-10-CM | POA: Diagnosis not present

## 2023-11-25 DIAGNOSIS — J45909 Unspecified asthma, uncomplicated: Secondary | ICD-10-CM | POA: Diagnosis not present

## 2023-11-25 DIAGNOSIS — E782 Mixed hyperlipidemia: Secondary | ICD-10-CM | POA: Diagnosis not present

## 2023-11-25 DIAGNOSIS — E669 Obesity, unspecified: Secondary | ICD-10-CM | POA: Diagnosis not present

## 2023-11-25 DIAGNOSIS — K754 Autoimmune hepatitis: Secondary | ICD-10-CM | POA: Diagnosis not present

## 2023-11-25 DIAGNOSIS — R600 Localized edema: Secondary | ICD-10-CM | POA: Diagnosis not present

## 2023-11-25 DIAGNOSIS — D84821 Immunodeficiency due to drugs: Secondary | ICD-10-CM | POA: Diagnosis not present

## 2023-11-25 DIAGNOSIS — H9193 Unspecified hearing loss, bilateral: Secondary | ICD-10-CM | POA: Diagnosis not present

## 2023-11-25 DIAGNOSIS — I2510 Atherosclerotic heart disease of native coronary artery without angina pectoris: Secondary | ICD-10-CM | POA: Diagnosis not present

## 2023-11-25 DIAGNOSIS — Z7901 Long term (current) use of anticoagulants: Secondary | ICD-10-CM | POA: Diagnosis not present

## 2023-11-25 DIAGNOSIS — E1369 Other specified diabetes mellitus with other specified complication: Secondary | ICD-10-CM | POA: Diagnosis not present

## 2023-11-25 DIAGNOSIS — I252 Old myocardial infarction: Secondary | ICD-10-CM | POA: Diagnosis not present

## 2023-11-25 DIAGNOSIS — H259 Unspecified age-related cataract: Secondary | ICD-10-CM | POA: Diagnosis not present

## 2023-12-08 ENCOUNTER — Other Ambulatory Visit (HOSPITAL_COMMUNITY): Payer: Self-pay

## 2023-12-31 ENCOUNTER — Ambulatory Visit: Payer: Self-pay | Admitting: *Deleted

## 2024-01-01 DIAGNOSIS — E782 Mixed hyperlipidemia: Secondary | ICD-10-CM | POA: Diagnosis not present

## 2024-01-01 DIAGNOSIS — E1140 Type 2 diabetes mellitus with diabetic neuropathy, unspecified: Secondary | ICD-10-CM | POA: Diagnosis not present

## 2024-01-01 DIAGNOSIS — I2510 Atherosclerotic heart disease of native coronary artery without angina pectoris: Secondary | ICD-10-CM | POA: Diagnosis not present

## 2024-01-01 DIAGNOSIS — I10 Essential (primary) hypertension: Secondary | ICD-10-CM | POA: Diagnosis not present

## 2024-01-05 ENCOUNTER — Other Ambulatory Visit (HOSPITAL_COMMUNITY): Payer: Self-pay

## 2024-01-12 ENCOUNTER — Encounter: Payer: Self-pay | Admitting: *Deleted

## 2024-01-12 NOTE — Patient Instructions (Addendum)
 Visit Information  Thank you for taking time to visit with me today. Please don't hesitate to contact me if I can be of assistance to you before our next scheduled appointment.  Your next care management appointment is by telephone on 01/30/24 at 3 pm  Please call your MD office to report any worsening symptoms in between of you office visits   Please call the care guide team at [phone] if you need to cancel, schedule, or reschedule an appointment.   Please call the Suicide and Crisis Lifeline: 988 call the [place]  National Suicide Prevention Lifeline: 916-303-8289 or TTY: 754-612-8816 TTY 865-174-9217) to talk to a trained counselor call 1-800-273-TALK (toll free, 24 hour hotline) call the [facility]: 805-310-2014 call 911 if you are experiencing a Mental Health or Behavioral Health Crisis or need someone to talk to.  Karanveer Ramakrishnan L. Mcarthur Speedy, RN, BSN, CCM [place]  [facility], [facility] Health RN Care Manager Direct Dial : [phone]  Fax: [phone]

## 2024-01-12 NOTE — Patient Outreach (Signed)
 Complex Care Management   Visit Note  01/12/2024 Late note for 12/30/24  Name:  Douglas Mason MRN: [id number] DOB: Jun 19, 1951  Situation: Referral received for Complex Care Management related to Heart Failure and Diabetes with Complications I obtained verbal consent from Patient & wife, Douglas Mason.  Visit completed with Caregiver , wife, Douglas Mason  on the phone  Background:   Past Medical History:  Diagnosis Date   Bronchitis    Diabetes mellitus without complication (HCC)    GERD (gastroesophageal reflux disease)    High cholesterol    Hypertension    Pleurisy    Pneumonia    Sleep apnea    nurse in pre-op observed pt sats drop to 80-82% while sleeping on RA,wake up would go to 87%    Assessment: Patient Reported Symptoms:  Cognitive Cognitive Status: Alert and oriented to person, place, and time, Insightful and able to interpret abstract concepts, Normal speech and language skills Cognitive/Intellectual Conditions Management [RPT]: Other Other: anxiety   Health Maintenance Behaviors: Annual physical exam, Hobbies, Sleep adequate, Social activities Healing Pattern: Average Health Facilitated by: Pain control, Rest  Neurological Neurological Review of Symptoms: Numbness, Weakness Neurological Management Strategies: Activity, Adequate rest, Medication therapy, Routine screening Neurological Self-Management Outcome: 3 (uncertain) Neurological Comment: diabetic neuropathy peripheral  HEENT HEENT Symptoms Reported: No symptoms reported HEENT Management Strategies: Adequate rest, Routine screening HEENT Self-Management Outcome: 4 (good)    Cardiovascular Cardiovascular Symptoms Reported: Swelling in legs or feet Cardiovascular Conditions: Hypertension, High blood cholesterol, Heart failure, Myocardial infarction Cardiovascular Management Strategies: Adequate rest, Routine screening, Medication therapy Cardiovascular Self-Management Outcome: 3 (uncertain) Cardiovascular Comment:  anemia hepatitis  Respiratory Respiratory Symptoms Reported: Shortness of breath Other Respiratory Symptoms: Shortness of breath with walking activity Respiratory Conditions: Sleep disordered breathing, Shortness of breath Respiratory Self-Management Outcome: 3 (uncertain)  Endocrine Patient reports the following symptoms related to hypoglycemia or hyperglycemia : Shortness of breath, Weakness or fatigue Endocrine Conditions: Diabetes Endocrine Management Strategies: Adequate rest, Medication therapy, Medical device, Routine screening Endocrine Self-Management Outcome: 3 (uncertain)  Gastrointestinal Gastrointestinal Symptoms Reported: No symptoms reported Gastrointestinal Conditions: Diarrhea, Reflux/heartburn Gastrointestinal Self-Management Outcome: 4 (good) Nutrition Risk Screen (CP): No indicators present  Genitourinary Genitourinary Symptoms Reported: Blood in urine Genitourinary Management Strategies: Adequate rest Genitourinary Self-Management Outcome: 4 (good)  Integumentary Integumentary Symptoms Reported: No symptoms reported Skin Management Strategies: Adequate rest, Routine screening Skin Self-Management Outcome: 4 (good)  Musculoskeletal Musculoskelatal Symptoms Reviewed: Other, Weakness, Muscle pain Other Musculoskeletal Symptoms: swelling of legs numb feet tremor Additional Musculoskeletal Details: right flank pain Musculoskeletal Conditions: Joint pain Musculoskeletal Management Strategies: Adequate rest, Medication therapy, Routine screening Musculoskeletal Self-Management Outcome: 3 (uncertain) Falls in the past year?: No Number of falls in past year: 1 or less Was there an injury with Fall?: No Fall Risk Category Calculator: 0 Patient Fall Risk Level: Low Fall Risk Patient at Risk for Falls Due to: No Fall Risks Fall risk Follow up: Falls evaluation completed  Psychosocial Psychosocial Symptoms Reported: Anxiety - if selected complete GAD Behavioral Health  Conditions: Anxiety Behavioral Management Strategies: Support system, Medication therapy, Adequate rest, Activity, Coping strategies Behavioral Health Self-Management Outcome: 3 (uncertain) Major Change/Loss/Stressor/Fears (CP): Medical condition, family, Medical condition, self Techniques to Cope with Loss/Stress/Change: Diversional activities, Medication, Withdraw Quality of Family Relationships: helpful, supportive Do you feel physically threatened by others?: No      12/31/2023    4:42 PM  Depression screen PHQ 2/9  Decreased Interest 1  Down, Depressed, Hopeless 1  PHQ - 2 Score 2  Altered sleeping 0  Tired, decreased energy 1  Change in appetite 0  Feeling bad or failure about yourself  0  Trouble concentrating 0  Moving slowly or fidgety/restless 0  Suicidal thoughts 0  PHQ-9 Score 3  Difficult doing work/chores Somewhat difficult    There were no vitals filed for this visit.  Medications Reviewed Today   Medications were not reviewed in this encounter     Recommendation:   PCP Follow-up  Follow Up Plan:   Telephone follow up appointment date/time:  01/30/24 3 pm  Stephenie Navejas L. Mcarthur Speedy, RN, BSN, CCM [place]  [facility], [facility] Health RN Care Manager Direct Dial : [phone]  Fax: [phone]

## 2024-01-13 NOTE — Addendum Note (Signed)
 Addended by: Arlyce Berger on: 01/13/2024 10:03 AM   Modules accepted: Orders

## 2024-01-27 ENCOUNTER — Telehealth: Payer: Self-pay | Admitting: Internal Medicine

## 2024-01-27 DIAGNOSIS — E1140 Type 2 diabetes mellitus with diabetic neuropathy, unspecified: Secondary | ICD-10-CM | POA: Diagnosis not present

## 2024-01-27 DIAGNOSIS — K219 Gastro-esophageal reflux disease without esophagitis: Secondary | ICD-10-CM | POA: Diagnosis not present

## 2024-01-27 DIAGNOSIS — Z7189 Other specified counseling: Secondary | ICD-10-CM | POA: Diagnosis not present

## 2024-01-27 DIAGNOSIS — R251 Tremor, unspecified: Secondary | ICD-10-CM | POA: Diagnosis not present

## 2024-01-27 DIAGNOSIS — I10 Essential (primary) hypertension: Secondary | ICD-10-CM | POA: Diagnosis not present

## 2024-01-27 DIAGNOSIS — I5020 Unspecified systolic (congestive) heart failure: Secondary | ICD-10-CM | POA: Diagnosis not present

## 2024-01-27 DIAGNOSIS — K754 Autoimmune hepatitis: Secondary | ICD-10-CM | POA: Diagnosis not present

## 2024-01-27 DIAGNOSIS — E782 Mixed hyperlipidemia: Secondary | ICD-10-CM | POA: Diagnosis not present

## 2024-01-27 DIAGNOSIS — F419 Anxiety disorder, unspecified: Secondary | ICD-10-CM | POA: Diagnosis not present

## 2024-01-27 DIAGNOSIS — I2510 Atherosclerotic heart disease of native coronary artery without angina pectoris: Secondary | ICD-10-CM | POA: Diagnosis not present

## 2024-01-27 NOTE — Telephone Encounter (Signed)
 Pt c/o medication issue:  1. Name of Medication: bisoprolol  (ZEBETA ) 5 MG tablet   2. How are you currently taking this medication (dosage and times per day)? a  3. Are you having a reaction (difficulty breathing--STAT)? no  4. What is your medication issue? Office is calling to see if the dr will be okay with them changing medication Propranolol because of patients  trimmers. Please advise

## 2024-01-27 NOTE — Telephone Encounter (Signed)
 I will forward to both Pharm-D and Dr.Mallipeddi  They are requesting switching one beta blocker -bisoprolol  to another -propranolol for tremors.

## 2024-01-30 ENCOUNTER — Other Ambulatory Visit: Payer: PRIVATE HEALTH INSURANCE | Admitting: *Deleted

## 2024-01-30 DIAGNOSIS — R251 Tremor, unspecified: Secondary | ICD-10-CM | POA: Insufficient documentation

## 2024-01-30 NOTE — Patient Outreach (Incomplete)
 Complex Care Management   Visit Note  01/30/2024  Name:  Douglas Mason MRN: [id number] DOB: 1951-05-02  Situation: Referral received for Complex Care Management related to Heart Failure and Diabetes with Complications I obtained verbal consent from Caregiver , wife.  Visit completed with Elijah [place]  on the phone  Seen by pharmD this week  Background:   Past Medical History:  Diagnosis Date   Bronchitis    Diabetes mellitus without complication (HCC)    GERD (gastroesophageal reflux disease)    High cholesterol    Hypertension    Pleurisy    Pneumonia    Sleep apnea    nurse in pre-op observed pt sats drop to 80-82% while sleeping on RA,wake up would go to 87%    Assessment: Patient Reported Symptoms:  Cognitive        Neurological Neurological Review of Symptoms: Numbness, Weakness Neurological Management Strategies: Routine screening Neurological Self-Management Outcome: 3 (uncertain) Neurological Comment: now aware to check his feet daily He has difficulty standing long periods of time  HEENT HEENT Symptoms Reported: Change or loss of hearing HEENT Conditions: Ear problem(s) (hard of hearing) Ear problem(s) (hard of hearing)  Cardiovascular      Respiratory      Endocrine      Gastrointestinal        Genitourinary      Integumentary      Musculoskeletal          Psychosocial              12/31/2023    4:42 PM  Depression screen PHQ 2/9  Decreased Interest 1  Down, Depressed, Hopeless 1  PHQ - 2 Score 2  Altered sleeping 0  Tired, decreased energy 1  Change in appetite 0  Feeling bad or failure about yourself  0  Trouble concentrating 0  Moving slowly or fidgety/restless 0  Suicidal thoughts 0  PHQ-9 Score 3  Difficult doing work/chores Somewhat difficult    There were no vitals filed for this visit.  Medications Reviewed Today   Medications were not reviewed in this encounter     Recommendation:   PCP Follow-up Specialty  provider follow-up cardiology pharmacist  Follow Up Plan:   Telephone follow up appointment date/time:  02/13/24 3:30 pm  Jullie Oiler L. Mcarthur Speedy, RN, BSN, CCM [place]  [facility], [facility] Health RN Care Manager Direct Dial : [phone]  Fax: [phone]

## 2024-01-30 NOTE — Patient Instructions (Signed)
 Visit Information  Thank you for taking time to visit with me today. Please don't hesitate to contact me if I can be of assistance to you before our next scheduled appointment.  Your next care management appointment is by telephone on 02/13/24 at 3:30 pm  Continue to work with your cardiology and pharmacist on medication management  Please call the care guide team at [phone] if you need to cancel, schedule, or reschedule an appointment.   Please call the Suicide and Crisis Lifeline: 988 call the [place]  National Suicide Prevention Lifeline: 631-183-4929 or TTY: 906-663-7344 TTY 863-888-7912) to talk to a trained counselor call 1-800-273-TALK (toll free, 24 hour hotline) call the [facility]: [phone] call 911 if you are experiencing a Mental Health or Behavioral Health Crisis or need someone to talk to.  Mychael Soots L. Mcarthur Speedy, RN, BSN, CCM [place]  [facility], [facility] Health RN Care Manager Direct Dial : [phone]  Fax: [phone]

## 2024-01-30 NOTE — Telephone Encounter (Signed)
 I left message for Douglas Mason what Dr.Mallipeddi had said and asked her to call back for any further questions.

## 2024-01-31 DIAGNOSIS — E1140 Type 2 diabetes mellitus with diabetic neuropathy, unspecified: Secondary | ICD-10-CM | POA: Diagnosis not present

## 2024-01-31 DIAGNOSIS — I10 Essential (primary) hypertension: Secondary | ICD-10-CM | POA: Diagnosis not present

## 2024-01-31 DIAGNOSIS — I2510 Atherosclerotic heart disease of native coronary artery without angina pectoris: Secondary | ICD-10-CM | POA: Diagnosis not present

## 2024-01-31 DIAGNOSIS — E782 Mixed hyperlipidemia: Secondary | ICD-10-CM | POA: Diagnosis not present

## 2024-02-02 ENCOUNTER — Other Ambulatory Visit: Payer: Self-pay

## 2024-02-02 DIAGNOSIS — R809 Proteinuria, unspecified: Secondary | ICD-10-CM | POA: Insufficient documentation

## 2024-02-02 DIAGNOSIS — E1140 Type 2 diabetes mellitus with diabetic neuropathy, unspecified: Secondary | ICD-10-CM | POA: Insufficient documentation

## 2024-02-02 DIAGNOSIS — R251 Tremor, unspecified: Secondary | ICD-10-CM | POA: Insufficient documentation

## 2024-02-02 DIAGNOSIS — D649 Anemia, unspecified: Secondary | ICD-10-CM | POA: Insufficient documentation

## 2024-02-02 DIAGNOSIS — I5020 Unspecified systolic (congestive) heart failure: Secondary | ICD-10-CM | POA: Insufficient documentation

## 2024-02-02 DIAGNOSIS — B179 Acute viral hepatitis, unspecified: Secondary | ICD-10-CM | POA: Insufficient documentation

## 2024-02-02 DIAGNOSIS — E669 Obesity, unspecified: Secondary | ICD-10-CM | POA: Insufficient documentation

## 2024-02-02 NOTE — Patient Outreach (Addendum)
 Complex Care Management   Visit Note  03/01/2024 late entry for 01/30/24  Name:  Douglas Mason MRN: [id number] DOB: September 08, 1950  Situation: Referral received for Complex Care Management related to Acute MI and acute kidney injury, hypertension I obtained verbal consent from Caregiver.  Visit completed with wife Douglas Mason  on the phone  Douglas Mason reports Douglas Mason with difficulty standing for long periods of time related to the neuropathy in his feet. She confirms he cares for her and does not always seek care for himself He goes up and down stairs to assist his wife.  Background:   Past Medical History:  Diagnosis Date   Bronchitis    Diabetes mellitus without complication (HCC)    GERD (gastroesophageal reflux disease)    High cholesterol    Hypertension    Pleurisy    Pneumonia    Sleep apnea    nurse in pre-op observed pt sats drop to 80-82% while sleeping on RA,wake up would go to 87%    Assessment: Patient Reported Symptoms:  Cognitive Cognitive Status: Able to follow simple commands, Alert and oriented to person, place, and time, Normal speech and language skills, Insightful and able to interpret abstract concepts   Health Maintenance Behaviors: Annual physical exam, Hobbies, Sleep adequate, Social activities Healing Pattern: Average Health Facilitated by: Pain control, Rest  Neurological Neurological Review of Symptoms: Numbness, Weakness Neurological Management Strategies: Routine screening, Medication therapy Neurological Self-Management Outcome: 3 (uncertain) Neurological Comment: now aware to check his feet daily He has difficulty standing long periods of time, tremor Declines neurology referral  HEENT HEENT Symptoms Reported: Change or loss of hearing   Ear problem(s) (hard of hearing)  Cardiovascular Cardiovascular Symptoms Reported: Swelling in legs or feet Does patient have uncontrolled Hypertension?: No Cardiovascular Management Strategies:  Medication therapy, Diet modification, Medical device, Adequate rest Cardiovascular Self-Management Outcome: 3 (uncertain)  Respiratory Respiratory Symptoms Reported: No symptoms reported Respiratory Management Strategies: Adequate rest, Routine screening, Medication therapy Respiratory Self-Management Outcome: 4 (good)  Endocrine Endocrine Symptoms Reported: Weakness or fatigue Is patient diabetic?: Yes Is patient checking blood sugars at home?: Yes List most recent blood sugar readings, include date and time of day: 100's Endocrine Self-Management Outcome: 4 (good)  Gastrointestinal Gastrointestinal Symptoms Reported: No symptoms reported Gastrointestinal Management Strategies: Medication therapy, Diet modification Gastrointestinal Self-Management Outcome: 4 (good) Nutrition Risk Screen (CP): No indicators present  Genitourinary Genitourinary Symptoms Reported: No symptoms reported Genitourinary Management Strategies: Adequate rest Genitourinary Self-Management Outcome: 4 (good)  Integumentary Integumentary Symptoms Reported: No symptoms reported Skin Management Strategies: Adequate rest, Routine screening Skin Self-Management Outcome: 4 (good)  Musculoskeletal Musculoskelatal Symptoms Reviewed: Weakness, Other Other Musculoskeletal Symptoms: swelling of legs numb feet tremor Musculoskeletal Management Strategies: Medication therapy, Medical device Musculoskeletal Self-Management Outcome: 3 (uncertain) Falls in the past year?: No Number of falls in past year: 1 or less Was there an injury with Fall?: No Fall Risk Category Calculator: 0 Patient Fall Risk Level: Low Fall Risk Patient at Risk for Falls Due to: No Fall Risks Fall risk Follow up: Falls evaluation completed  Psychosocial Psychosocial Symptoms Reported: Anxiety - if selected complete GAD Behavioral Management Strategies: Adequate rest, Coping strategies, Medication therapy, Support system Behavioral Health Self-Management  Outcome: 3 (uncertain) Major Change/Loss/Stressor/Fears (CP): Medical condition, self, New family member Techniques to Cope with Loss/Stress/Change: Diversional activities, Medication, Withdraw Quality of Family Relationships: helpful, supportive Do you feel physically threatened by others?: No      12/31/2023    4:42 PM  Depression  screen PHQ 2/9  Decreased Interest 1  Down, Depressed, Hopeless 1  PHQ - 2 Score 2  Altered sleeping 0  Tired, decreased energy 1  Change in appetite 0  Feeling bad or failure about yourself  0  Trouble concentrating 0  Moving slowly or fidgety/restless 0  Suicidal thoughts 0  PHQ-9 Score 3  Difficult doing work/chores Somewhat difficult    There were no vitals filed for this visit.  Medications Reviewed Today   Medications were not reviewed in this encounter     Recommendation:   PCP Follow-up  Follow Up Plan:   Telephone follow up appointment date/time:  02/13/24  Suzen L. Ramonita, RN, BSN, CCM [place]  [facility], [facility] Health RN Care Manager Direct Dial : [phone]  Fax: [phone]

## 2024-02-13 ENCOUNTER — Telehealth: Payer: PRIVATE HEALTH INSURANCE | Admitting: *Deleted

## 2024-02-13 DIAGNOSIS — E1140 Type 2 diabetes mellitus with diabetic neuropathy, unspecified: Secondary | ICD-10-CM | POA: Diagnosis not present

## 2024-02-13 DIAGNOSIS — K754 Autoimmune hepatitis: Secondary | ICD-10-CM | POA: Diagnosis not present

## 2024-02-13 DIAGNOSIS — K219 Gastro-esophageal reflux disease without esophagitis: Secondary | ICD-10-CM | POA: Diagnosis not present

## 2024-02-13 DIAGNOSIS — R251 Tremor, unspecified: Secondary | ICD-10-CM | POA: Diagnosis not present

## 2024-02-13 DIAGNOSIS — F419 Anxiety disorder, unspecified: Secondary | ICD-10-CM | POA: Diagnosis not present

## 2024-02-13 DIAGNOSIS — I10 Essential (primary) hypertension: Secondary | ICD-10-CM | POA: Diagnosis not present

## 2024-02-13 DIAGNOSIS — M19041 Primary osteoarthritis, right hand: Secondary | ICD-10-CM | POA: Insufficient documentation

## 2024-02-13 DIAGNOSIS — E782 Mixed hyperlipidemia: Secondary | ICD-10-CM | POA: Diagnosis not present

## 2024-02-13 DIAGNOSIS — I5020 Unspecified systolic (congestive) heart failure: Secondary | ICD-10-CM | POA: Diagnosis not present

## 2024-02-13 DIAGNOSIS — I2510 Atherosclerotic heart disease of native coronary artery without angina pectoris: Secondary | ICD-10-CM | POA: Diagnosis not present

## 2024-02-14 ENCOUNTER — Other Ambulatory Visit (HOSPITAL_COMMUNITY): Payer: Self-pay

## 2024-02-24 DIAGNOSIS — M19041 Primary osteoarthritis, right hand: Secondary | ICD-10-CM | POA: Diagnosis not present

## 2024-02-24 DIAGNOSIS — R251 Tremor, unspecified: Secondary | ICD-10-CM | POA: Diagnosis not present

## 2024-02-24 DIAGNOSIS — I2510 Atherosclerotic heart disease of native coronary artery without angina pectoris: Secondary | ICD-10-CM | POA: Diagnosis not present

## 2024-02-24 DIAGNOSIS — F419 Anxiety disorder, unspecified: Secondary | ICD-10-CM | POA: Diagnosis not present

## 2024-02-24 DIAGNOSIS — I5020 Unspecified systolic (congestive) heart failure: Secondary | ICD-10-CM | POA: Diagnosis not present

## 2024-02-24 DIAGNOSIS — I10 Essential (primary) hypertension: Secondary | ICD-10-CM | POA: Diagnosis not present

## 2024-02-24 DIAGNOSIS — K754 Autoimmune hepatitis: Secondary | ICD-10-CM | POA: Diagnosis not present

## 2024-02-24 DIAGNOSIS — E782 Mixed hyperlipidemia: Secondary | ICD-10-CM | POA: Diagnosis not present

## 2024-02-24 DIAGNOSIS — K219 Gastro-esophageal reflux disease without esophagitis: Secondary | ICD-10-CM | POA: Diagnosis not present

## 2024-02-24 DIAGNOSIS — E1140 Type 2 diabetes mellitus with diabetic neuropathy, unspecified: Secondary | ICD-10-CM | POA: Diagnosis not present

## 2024-02-25 ENCOUNTER — Other Ambulatory Visit: Payer: Self-pay | Admitting: Cardiology

## 2024-02-25 ENCOUNTER — Other Ambulatory Visit (HOSPITAL_COMMUNITY): Payer: Self-pay

## 2024-02-25 MED ORDER — ISOSORBIDE MONONITRATE ER 30 MG PO TB24
30.0000 mg | ORAL_TABLET | Freq: Every day | ORAL | 0 refills | Status: DC
  Filled 2024-02-25: qty 90, 90d supply, fill #0

## 2024-02-26 ENCOUNTER — Other Ambulatory Visit (HOSPITAL_COMMUNITY): Payer: Self-pay

## 2024-03-01 DIAGNOSIS — I10 Essential (primary) hypertension: Secondary | ICD-10-CM | POA: Diagnosis not present

## 2024-03-01 DIAGNOSIS — E782 Mixed hyperlipidemia: Secondary | ICD-10-CM | POA: Diagnosis not present

## 2024-03-01 DIAGNOSIS — I5020 Unspecified systolic (congestive) heart failure: Secondary | ICD-10-CM | POA: Diagnosis not present

## 2024-03-01 DIAGNOSIS — E1369 Other specified diabetes mellitus with other specified complication: Secondary | ICD-10-CM | POA: Diagnosis not present

## 2024-03-12 ENCOUNTER — Other Ambulatory Visit: Payer: Self-pay | Admitting: Cardiology

## 2024-03-12 ENCOUNTER — Other Ambulatory Visit (HOSPITAL_COMMUNITY): Payer: Self-pay

## 2024-03-15 ENCOUNTER — Other Ambulatory Visit: Payer: Self-pay

## 2024-03-15 ENCOUNTER — Other Ambulatory Visit (HOSPITAL_COMMUNITY): Payer: Self-pay

## 2024-03-15 MED ORDER — EMPAGLIFLOZIN 25 MG PO TABS
25.0000 mg | ORAL_TABLET | Freq: Every day | ORAL | 2 refills | Status: DC
  Filled 2024-03-15: qty 30, 30d supply, fill #0
  Filled 2024-04-15: qty 30, 30d supply, fill #1
  Filled 2024-05-15: qty 30, 30d supply, fill #2

## 2024-03-18 ENCOUNTER — Ambulatory Visit: Payer: PRIVATE HEALTH INSURANCE | Admitting: Internal Medicine

## 2024-03-18 ENCOUNTER — Telehealth: Payer: Self-pay | Admitting: *Deleted

## 2024-03-18 VITALS — BP 128/72 | HR 73 | Temp 98.4°F | Ht 72.0 in | Wt 230.4 lb

## 2024-03-18 DIAGNOSIS — Z79624 Long term (current) use of inhibitors of nucleotide synthesis: Secondary | ICD-10-CM | POA: Diagnosis not present

## 2024-03-18 DIAGNOSIS — K7460 Unspecified cirrhosis of liver: Secondary | ICD-10-CM

## 2024-03-18 DIAGNOSIS — K219 Gastro-esophageal reflux disease without esophagitis: Secondary | ICD-10-CM | POA: Diagnosis not present

## 2024-03-18 DIAGNOSIS — R7989 Other specified abnormal findings of blood chemistry: Secondary | ICD-10-CM | POA: Diagnosis not present

## 2024-03-18 DIAGNOSIS — K754 Autoimmune hepatitis: Secondary | ICD-10-CM | POA: Diagnosis not present

## 2024-03-18 MED ORDER — AZATHIOPRINE 50 MG PO TABS: 50.0000 mg | ORAL_TABLET | Freq: Every day | ORAL | 11 refills | Status: AC

## 2024-03-18 NOTE — Patient Instructions (Signed)
 I am going to order an ultrasound of your liver today.   I am also going to order blood work to be done at [facility].  We will call with these results.   Continue on azathioprine .   Continue on pantoprazole  for your chronic reflux.   Follow-up in 6 months or sooner if needed.  It was very nice seeing you again today.   Dr. Cindie

## 2024-03-18 NOTE — Telephone Encounter (Signed)
 Called pt to give US  appointment but not available.   US  scheduled for 7/24 arrival 10:45am, npo midnight

## 2024-03-18 NOTE — Telephone Encounter (Signed)
 Spoke with pt and he is aware of appt 

## 2024-03-18 NOTE — Progress Notes (Signed)
 Referring Provider: Shona Norleen PEDLAR, MD Primary Care Physician:  Shona Norleen PEDLAR, MD Primary GI:  Dr. Cindie  Chief Complaint  Patient presents with   Follow-up    Follow up autoimmune hep. And GERD. Pt states he is doing good    HPI:   Douglas Mason is a 73 y.o. male  who presents to the clinic today for follow-up visit.  Last seen January 2024.  Did not follow-up as recommended.  Initially referred for abnormal LFTs.  Subsequent work-up as follows:   Ultrasound with elastography 10/04/2020 showed coarsened hepatic echotexture with subtle contour nodularity suspicious for early mild hepatic cirrhosis, and median kPA 8.1.   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     Subsequent liver biopsy 10/12/2020 with patchy but prominent portal and lobular inflammation composed of lymphocytes and plasma cells with accompanying moderate to focally severe interface hepatitis and associated periportal hepatocyte necrosis.  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  Also noted patchy advanced fibrosis   On 10/20/2020, patient was placed on prednisone  60 mg daily for 1 week then 40 mg daily for 1 week then 30 mg daily for 1 week and has been on 20 mg daily    LFTs 11/13/2020 showed normalization of his AST 27, ALT 46, T bili 0.9 alk phos 49.  Patient ran out of prednisone  for a few weeks and did not call our office.  I restarted him on prednisone  20 mg daily and LFTs 12/21/2020 showed normalization.  Was then started on Azathioprine  April 2022 and subsequently weaned completely off of prednisone .  On prior visit, patient did not have his blood work or updated ultrasound completed.  Most recent blood work 11/19/2023 showed AST 27, ALT 21, alk phos 83, T. bili 0.8, albumin 4.6, normal CBC.   No complaints for me today.  Currently doing well.  Continues azathioprine  50 mg daily.    Past Medical History:  Diagnosis Date   Bronchitis    Diabetes mellitus without complication (HCC)     GERD (gastroesophageal reflux disease)    High cholesterol    Hypertension    Pleurisy    Pneumonia    Sleep apnea    nurse in pre-op observed pt sats drop to 80-82% while sleeping on RA,wake up would go to 87%    Past Surgical History:  Procedure Laterality Date   CHOLECYSTECTOMY N/A 01/13/2019   Procedure: LAPAROSCOPIC CHOLECYSTECTOMY;  Surgeon: Kallie Manuelita BROCKS, MD;  Location: [facility];  Service: General;  Laterality: N/A;   CORONARY IMAGING/OCT N/A 02/20/2023   Procedure: CORONARY IMAGING/OCT;  Surgeon: Wendel Lurena POUR, MD;  Location: [facility] INVASIVE CV LAB;  Service: Cardiovascular;  Laterality: N/A;   CORONARY STENT INTERVENTION N/A 02/20/2023   Procedure: CORONARY STENT INTERVENTION;  Surgeon: Wendel Lurena POUR, MD;  Location: [facility] INVASIVE CV [facility];  Service: Cardiovascular;  Laterality: N/A;   CORONARY/GRAFT ACUTE MI REVASCULARIZATION N/A 03/01/2023   Procedure: Coronary/Graft Acute MI Revascularization;  Surgeon: Dann Candyce RAMAN, MD;  Location: [facility] INVASIVE CV LAB;  Service: Cardiovascular;  Laterality: N/A;   LEFT HEART CATH AND CORONARY ANGIOGRAPHY N/A 02/20/2023   Procedure: LEFT HEART CATH AND CORONARY ANGIOGRAPHY;  Surgeon: Wendel Lurena POUR, MD;  Location: [facility] INVASIVE CV LAB;  Service: Cardiovascular;  Laterality: N/A;   LEFT HEART CATH AND CORONARY ANGIOGRAPHY N/A 03/01/2023   Procedure: LEFT HEART CATH AND CORONARY ANGIOGRAPHY;  Surgeon: Dann Candyce RAMAN, MD;  Location: [facility] INVASIVE CV LAB;  Service: Cardiovascular;  Laterality:  N/A;    Current Outpatient Medications  Medication Sig Dispense Refill   albuterol  (PROVENTIL  HFA;VENTOLIN  HFA) 108 (90 BASE) MCG/ACT inhaler Inhale 2 puffs into the lungs every 4 (four) hours as needed for wheezing or shortness of breath. 1 Inhaler 1   albuterol  (PROVENTIL ) (2.5 MG/3ML) 0.083% nebulizer solution Take 3 mLs (2.5 mg total) by nebulization every 6 (six) hours as needed for wheezing or shortness of breath. 75 mL 12   Albuterol  Sulfate,  sensor, 108 (90 Base) MCG/ACT AEPB INHALE 1-2 PUFFS (180 MCG) BY INHALATION ROUTE EVERY 4-6 HOURS AS NEEDED     ALPRAZolam  (XANAX ) 0.5 MG tablet Take 0.5 mg by mouth at bedtime as needed for anxiety.     ASPIRIN  81 PO Take 1 tablet every day by oral route.     aspirin  EC 81 MG tablet Take 1 tablet (81 mg total) by mouth daily. 120 tablet 0   atorvastatin  (LIPITOR) 80 MG tablet Take 1 tablet (80 mg total) by mouth daily. 30 tablet 11   azaTHIOprine  (IMURAN ) 50 MG tablet take 1 tablet by mouth once daily 30 tablet 11   bisoprolol  (ZEBETA ) 5 MG tablet Take 1 tablet (5 mg total) by mouth daily. 90 tablet 2   empagliflozin  (JARDIANCE ) 25 MG TABS tablet Take 1 tablet (25 mg total) by mouth daily. 30 tablet 2   glipiZIDE  (GLUCOTROL  XL) 10 MG 24 hr tablet Take 10 mg by mouth 2 (two) times daily.     HYDROcodone -acetaminophen  (NORCO/VICODIN) 5-325 MG tablet Take 1 tablet by mouth at bedtime as needed for pain.     isosorbide  mononitrate (IMDUR ) 30 MG 24 hr tablet Take 1 tablet (30 mg total) by mouth daily. Please call [phone] to make your yearly appt with your Cardiologist for future refills. Thank you 90 tablet 0   losartan  (COZAAR ) 25 MG tablet Take 1 tablet (25 mg total) by mouth daily. 30 tablet 1   Magnesium  400 MG TABS Take 1 tablet by mouth daily.     magnesium  oxide (MAG-OX) 400 MG tablet Take 1 tablet 3 times a day by oral route.     metFORMIN  (GLUCOPHAGE -XR) 500 MG 24 hr tablet Take 2 tablets (1,000 mg total) by mouth 2 (two) times a day.     Multiple Vitamin (MULTIVITAMIN ADULT PO) daily.     nitroGLYCERIN  (NITROSTAT ) 0.4 MG SL tablet Place 1 tablet (0.4 mg total) under the tongue every 5 (five) minutes as needed for chest pain. 25 tablet 1   pantoprazole  (PROTONIX ) 40 MG tablet Take 40 mg by mouth daily.     prasugrel  (EFFIENT ) 10 MG TABS tablet Take 1 tablet (10 mg total) by mouth daily. 90 tablet 3   spironolactone  (ALDACTONE ) 25 MG tablet Take 0.5 tablets (12.5 mg total) by mouth daily.  45 tablet 3   TOUJEO  SOLOSTAR 300 UNIT/ML Solostar Pen Inject 12 Units into the skin daily.     No current facility-administered medications for this visit.    Allergies as of 03/18/2024 - Review Complete 03/18/2024  Allergen Reaction Noted   Lisinopril  Cough 10/09/2020    Family History  Problem Relation Age of Onset   Asthma Mother    Leukemia Mother    Rheum arthritis Mother    Rheum arthritis Maternal Grandmother     Social History   Socioeconomic History   Marital status: Married    Spouse name: Not on file   Number of children: Not on file   Years of education: Not on file  Highest education level: Not on file  Occupational History    Employer: [employer]    Comment: [employer]  Tobacco Use   Smoking status: Former    Current packs/day: 0.00    Types: Cigarettes    Quit date: 09/02/1976    Years since quitting: 47.5   Smokeless tobacco: Former    Types: Chew    Quit date: 09/03/2003  Vaping Use   Vaping status: Never Used  Substance and Sexual Activity   Alcohol use: No   Drug use: No   Sexual activity: Yes  Other Topics Concern   Not on file  Social History Narrative   Not on file   Social Drivers of Health   Financial Resource Strain: Low Risk  (01/02/2024)   Overall Financial Resource Strain (CARDIA)    Difficulty of Paying Living Expenses: Not hard at all  Food Insecurity: No Food Insecurity (12/31/2023)   Hunger Vital Sign    Worried About Running Out of Food in the Last Year: Never true    Ran Out of Food in the Last Year: Never true  Transportation Needs: No Transportation Needs (12/31/2023)   PRAPARE - Administrator, Civil Service (Medical): No    Lack of Transportation (Non-Medical): No  Physical Activity: Insufficiently Active (12/31/2023)   Exercise Vital Sign    Days of Exercise per Week: 2 days    Minutes of Exercise per Session: 30 min  Stress: No Stress Concern Present (12/31/2023)   [employer] of Occupational Health -  Occupational Stress Questionnaire    Feeling of Stress : Only a little  Social Connections: Socially Integrated (12/31/2023)   Social Connection and Isolation Panel    Frequency of Communication with Friends and Family: More than three times a week    Frequency of Social Gatherings with Friends and Family: More than three times a week    Attends Religious Services: 1 to 4 times per year    Active Member of [employer] or Organizations: Yes    Attends Banker Meetings: 1 to 4 times per year    Marital Status: Married    Subjective: Review of Systems  Constitutional:  Negative for chills and fever.  HENT:  Negative for congestion and hearing loss.   Eyes:  Negative for blurred vision and double vision.  Respiratory:  Negative for cough and shortness of breath.   Cardiovascular:  Negative for chest pain and palpitations.  Gastrointestinal:  Negative for abdominal pain, blood in stool, constipation, diarrhea, heartburn, melena and vomiting.  Genitourinary:  Negative for dysuria and urgency.  Musculoskeletal:  Negative for joint pain and myalgias.  Skin:  Negative for itching and rash.  Neurological:  Negative for dizziness and headaches.  Psychiatric/Behavioral:  Negative for depression. The patient is not nervous/anxious.      Objective: BP 128/72   Pulse 73   Temp 98.4 F (36.9 C)   Ht 6' (1.829 m)   Wt 230 lb 6.4 oz (104.5 kg)   BMI 31.25 kg/m  Physical Exam Constitutional:      Appearance: Normal appearance.  HENT:     Head: Normocephalic and atraumatic.  Eyes:     Extraocular Movements: Extraocular movements intact.     Conjunctiva/sclera: Conjunctivae normal.  Cardiovascular:     Rate and Rhythm: Normal rate and regular rhythm.  Pulmonary:     Effort: Pulmonary effort is normal.     Breath sounds: Normal breath sounds.  Abdominal:     General:  Bowel sounds are normal.     Palpations: Abdomen is soft.  Musculoskeletal:        General: Normal range of  motion.     Cervical back: Normal range of motion and neck supple.  Skin:    General: Skin is warm.  Neurological:     General: No focal deficit present.     Mental Status: He is alert and oriented to person, place, and time.  Psychiatric:        Mood and Affect: Mood normal.        Behavior: Behavior normal.      Assessment/Plan:  1.  Autoimmune hepatitis-in remission.  Discussed autoimmune hepatitis in depth with patient today.  Discussed long-term implications, risk of relapse, chronic therapy.  TPMT status normal.  LFTs, IgG, and GGT have normalized.   Tolerating azathioprine  without any issues.  Continue on 50 mg daily.  Refilled today.  Recheck blood work today.    Ultrasound ordered today.  2.  Chronic GERD-well-controlled on pantoprazole .  We will continue.  3.  Colon cancer screening-continue Cologuard every 3 years  Follow-up in 6 months or sooner if needed.  03/18/2024 9:58 AM   Disclaimer: This note was dictated with voice recognition software. Similar sounding words can inadvertently be transcribed and may not be corrected upon review.

## 2024-03-19 LAB — HEPATIC FUNCTION PANEL
AG Ratio: 1.9 (calc) (ref 1.0–2.5)
ALT: 20 U/L (ref 9–46)
AST: 18 U/L (ref 10–35)
Albumin: 4.5 g/dL (ref 3.6–5.1)
Alkaline phosphatase (APISO): 66 U/L (ref 35–144)
Bilirubin, Direct: 0.2 mg/dL (ref 0.0–0.2)
Globulin: 2.4 g/dL (ref 1.9–3.7)
Indirect Bilirubin: 0.6 mg/dL (ref 0.2–1.2)
Total Bilirubin: 0.8 mg/dL (ref 0.2–1.2)
Total Protein: 6.9 g/dL (ref 6.1–8.1)

## 2024-03-19 LAB — CBC
HCT: 40.7 % (ref 38.5–50.0)
Hemoglobin: 13 g/dL — ABNORMAL LOW (ref 13.2–17.1)
MCH: 31 pg (ref 27.0–33.0)
MCHC: 31.9 g/dL — ABNORMAL LOW (ref 32.0–36.0)
MCV: 97.1 fL (ref 80.0–100.0)
MPV: 9.8 fL (ref 7.5–12.5)
Platelets: 299 Thousand/uL (ref 140–400)
RBC: 4.19 Million/uL — ABNORMAL LOW (ref 4.20–5.80)
RDW: 15.1 % — ABNORMAL HIGH (ref 11.0–15.0)
WBC: 7.7 Thousand/uL (ref 3.8–10.8)

## 2024-03-19 LAB — AFP TUMOR MARKER: AFP-Tumor Marker: 2 ng/mL (ref <6.1)

## 2024-03-19 LAB — IGG, IGA, IGM
IgG (Immunoglobin G), Serum: 1181 mg/dL (ref 600–1540)
IgM, Serum: 79 mg/dL (ref 50–300)
Immunoglobulin A: 130 mg/dL (ref 70–320)

## 2024-03-19 LAB — GAMMA GT: GGT: 36 U/L (ref 3–70)

## 2024-03-23 DIAGNOSIS — E782 Mixed hyperlipidemia: Secondary | ICD-10-CM | POA: Diagnosis not present

## 2024-03-23 DIAGNOSIS — I10 Essential (primary) hypertension: Secondary | ICD-10-CM | POA: Diagnosis not present

## 2024-03-23 DIAGNOSIS — E1369 Other specified diabetes mellitus with other specified complication: Secondary | ICD-10-CM | POA: Diagnosis not present

## 2024-03-25 ENCOUNTER — Ambulatory Visit (HOSPITAL_COMMUNITY)
Admission: RE | Admit: 2024-03-25 | Discharge: 2024-03-25 | Disposition: A | Discharge status: Home / Self Care | Payer: PRIVATE HEALTH INSURANCE | Source: Ambulatory Visit | Attending: Internal Medicine | Admitting: Internal Medicine

## 2024-03-25 DIAGNOSIS — K754 Autoimmune hepatitis: Secondary | ICD-10-CM | POA: Diagnosis not present

## 2024-03-25 DIAGNOSIS — Z9049 Acquired absence of other specified parts of digestive tract: Secondary | ICD-10-CM | POA: Diagnosis not present

## 2024-03-26 DIAGNOSIS — K754 Autoimmune hepatitis: Secondary | ICD-10-CM | POA: Diagnosis not present

## 2024-03-26 DIAGNOSIS — I77810 Thoracic aortic ectasia: Secondary | ICD-10-CM | POA: Diagnosis not present

## 2024-03-26 DIAGNOSIS — R251 Tremor, unspecified: Secondary | ICD-10-CM | POA: Diagnosis not present

## 2024-03-26 DIAGNOSIS — I10 Essential (primary) hypertension: Secondary | ICD-10-CM | POA: Diagnosis not present

## 2024-03-26 DIAGNOSIS — E1369 Other specified diabetes mellitus with other specified complication: Secondary | ICD-10-CM | POA: Diagnosis not present

## 2024-03-26 DIAGNOSIS — E782 Mixed hyperlipidemia: Secondary | ICD-10-CM | POA: Diagnosis not present

## 2024-03-26 DIAGNOSIS — I2510 Atherosclerotic heart disease of native coronary artery without angina pectoris: Secondary | ICD-10-CM | POA: Diagnosis not present

## 2024-03-26 DIAGNOSIS — R600 Localized edema: Secondary | ICD-10-CM | POA: Diagnosis not present

## 2024-03-26 DIAGNOSIS — K219 Gastro-esophageal reflux disease without esophagitis: Secondary | ICD-10-CM | POA: Diagnosis not present

## 2024-03-26 DIAGNOSIS — I509 Heart failure, unspecified: Secondary | ICD-10-CM | POA: Diagnosis not present

## 2024-03-26 DIAGNOSIS — I110 Hypertensive heart disease with heart failure: Secondary | ICD-10-CM | POA: Diagnosis not present

## 2024-03-26 DIAGNOSIS — F419 Anxiety disorder, unspecified: Secondary | ICD-10-CM | POA: Diagnosis not present

## 2024-03-30 DIAGNOSIS — M19041 Primary osteoarthritis, right hand: Secondary | ICD-10-CM | POA: Diagnosis not present

## 2024-03-30 DIAGNOSIS — F419 Anxiety disorder, unspecified: Secondary | ICD-10-CM | POA: Diagnosis not present

## 2024-03-30 DIAGNOSIS — K754 Autoimmune hepatitis: Secondary | ICD-10-CM | POA: Diagnosis not present

## 2024-03-30 DIAGNOSIS — E782 Mixed hyperlipidemia: Secondary | ICD-10-CM | POA: Diagnosis not present

## 2024-03-30 DIAGNOSIS — I2510 Atherosclerotic heart disease of native coronary artery without angina pectoris: Secondary | ICD-10-CM | POA: Diagnosis not present

## 2024-03-30 DIAGNOSIS — R251 Tremor, unspecified: Secondary | ICD-10-CM | POA: Diagnosis not present

## 2024-03-30 DIAGNOSIS — I10 Essential (primary) hypertension: Secondary | ICD-10-CM | POA: Diagnosis not present

## 2024-03-30 DIAGNOSIS — I5020 Unspecified systolic (congestive) heart failure: Secondary | ICD-10-CM | POA: Diagnosis not present

## 2024-03-30 DIAGNOSIS — K219 Gastro-esophageal reflux disease without esophagitis: Secondary | ICD-10-CM | POA: Diagnosis not present

## 2024-03-30 DIAGNOSIS — E1140 Type 2 diabetes mellitus with diabetic neuropathy, unspecified: Secondary | ICD-10-CM | POA: Diagnosis not present

## 2024-04-01 DIAGNOSIS — I10 Essential (primary) hypertension: Secondary | ICD-10-CM | POA: Diagnosis not present

## 2024-04-01 DIAGNOSIS — I5020 Unspecified systolic (congestive) heart failure: Secondary | ICD-10-CM | POA: Diagnosis not present

## 2024-04-01 DIAGNOSIS — E782 Mixed hyperlipidemia: Secondary | ICD-10-CM | POA: Diagnosis not present

## 2024-04-01 DIAGNOSIS — E1140 Type 2 diabetes mellitus with diabetic neuropathy, unspecified: Secondary | ICD-10-CM | POA: Diagnosis not present

## 2024-04-02 ENCOUNTER — Ambulatory Visit: Payer: Self-pay | Admitting: Internal Medicine

## 2024-04-12 ENCOUNTER — Other Ambulatory Visit: Payer: Self-pay | Admitting: Nurse Practitioner

## 2024-04-15 ENCOUNTER — Other Ambulatory Visit (HOSPITAL_COMMUNITY): Payer: Self-pay

## 2024-04-26 ENCOUNTER — Other Ambulatory Visit: Payer: Self-pay | Admitting: *Deleted

## 2024-04-26 NOTE — Patient Outreach (Signed)
 Complex Care Management   Visit Note  04/26/2024  Name:  Douglas Mason MRN: [id number] DOB: May 12, 1951  Situation: Referral received for Complex Care Management related to Acute MI and acute kidney injury and hypertension I obtained verbal consent from Patient.  Visit completed with Patient  on the phone  Douglas Mason reports he is doing well He reports some weakness but also discussed his age of 27.  He voices concern about his wife, Douglas Mason who he reports had a poor weekend related to pain, nausea, poor appetite. He reports feeling down some related to her health   Douglas Mason had been seen at Dr Milford office on 04/13/24 At that time he also denied any medical concerns and deferred RN CCM to his wife, Douglas Mason  Background:   Past Medical History:  Diagnosis Date   Bronchitis    Diabetes mellitus without complication (HCC)    GERD (gastroesophageal reflux disease)    High cholesterol    Hypertension    Pleurisy    Pneumonia    Sleep apnea    nurse in pre-op observed pt sats drop to 80-82% while sleeping on RA,wake up would go to 87%    Assessment: Patient Reported Symptoms:  Cognitive Cognitive Status: No symptoms reported      Neurological Neurological Review of Symptoms: Numbness, Weakness Neurological Management Strategies: Medication therapy, Routine screening Neurological Self-Management Outcome: 4 (good)  HEENT HEENT Symptoms Reported: No symptoms reported HEENT Self-Management Outcome: 4 (good)    Cardiovascular Cardiovascular Symptoms Reported: No symptoms reported Cardiovascular Self-Management Outcome: 4 (good)  Respiratory Respiratory Symptoms Reported: No symptoms reported Respiratory Self-Management Outcome: 4 (good)  Endocrine Endocrine Symptoms Reported: Weakness or fatigue Is patient diabetic?: Yes Is patient checking blood sugars at home?: Yes List most recent blood sugar readings, include date and time of day: 100's Endocrine Self-Management  Outcome: 4 (good)  Gastrointestinal Gastrointestinal Symptoms Reported: No symptoms reported Gastrointestinal Self-Management Outcome: 4 (good)    Genitourinary Genitourinary Symptoms Reported: No symptoms reported Genitourinary Self-Management Outcome: 4 (good)  Integumentary Integumentary Symptoms Reported: No symptoms reported Skin Self-Management Outcome: 4 (good)  Musculoskeletal Musculoskelatal Symptoms Reviewed: Weakness Musculoskeletal Self-Management Outcome: 4 (good)      Psychosocial Psychosocial Symptoms Reported: No symptoms reported Behavioral Health Self-Management Outcome: 4 (good)   Quality of Family Relationships: helpful, supportive Do you feel physically threatened by others?: No    04/26/2024    PHQ2-9 Depression Screening   Little interest or pleasure in doing things Not at all  Feeling down, depressed, or hopeless Several days  PHQ-2 - Total Score 1  Trouble falling or staying asleep, or sleeping too much    Feeling tired or having little energy    Poor appetite or overeating     Feeling bad about yourself - or that you are a failure or have let yourself or your family down    Trouble concentrating on things, such as reading the newspaper or watching television    Moving or speaking so slowly that other people could have noticed.  Or the opposite - being so fidgety or restless that you have been moving around a lot more than usual    Thoughts that you would be better off dead, or hurting yourself in some way    PHQ2-9 Total Score    If you checked off any problems, how difficult have these problems made it for you to do your work, take care of things at home, or get along with other people  Depression Interventions/Treatment      There were no vitals filed for this visit.  Medications Reviewed Today   Medications were not reviewed in this encounter     Recommendation:   PCP Follow-up Specialty provider follow-up Douglas Mason, 05/05/24  cardiology Continue Current Plan of Care   Follow Up Plan:   Telephone follow up appointment date/time:  05/27/24 1100  Douglas Mason L. Ramonita, RN, BSN, CCM [place]  [facility], [facility] RN Care Manager Direct Dial : [phone]  Fax: [phone]

## 2024-04-26 NOTE — Patient Instructions (Signed)
 Visit Information  Thank you for taking time to visit with me today. Please don't hesitate to contact me if I can be of assistance to you before our next scheduled appointment.  Your next care management appointment is by telephone on 05/27/24 at 1100     Please call the care guide team at [phone] if you need to cancel, schedule, or reschedule an appointment.   Please call the Suicide and Crisis Lifeline: 988 call the [place]  National Suicide Prevention Lifeline: 430-446-7753 or TTY: 2122282338 TTY (430) 464-0984) to talk to a trained counselor call 1-800-273-TALK (toll free, 24 hour hotline) call the [facility]: 832-532-6164 call 911 if you are experiencing a Mental Health or Behavioral Health Crisis or need someone to talk to.  Dshawn Mcnay L. Ramonita, RN, BSN, CCM   [facility], [facility] RN Care Manager Direct Dial : [phone]  Fax: [phone]

## 2024-04-27 DIAGNOSIS — E1140 Type 2 diabetes mellitus with diabetic neuropathy, unspecified: Secondary | ICD-10-CM | POA: Diagnosis not present

## 2024-04-27 DIAGNOSIS — M19041 Primary osteoarthritis, right hand: Secondary | ICD-10-CM | POA: Diagnosis not present

## 2024-04-27 DIAGNOSIS — R251 Tremor, unspecified: Secondary | ICD-10-CM | POA: Diagnosis not present

## 2024-04-27 DIAGNOSIS — I10 Essential (primary) hypertension: Secondary | ICD-10-CM | POA: Diagnosis not present

## 2024-04-27 DIAGNOSIS — I5020 Unspecified systolic (congestive) heart failure: Secondary | ICD-10-CM | POA: Diagnosis not present

## 2024-04-27 DIAGNOSIS — I2510 Atherosclerotic heart disease of native coronary artery without angina pectoris: Secondary | ICD-10-CM | POA: Diagnosis not present

## 2024-04-27 DIAGNOSIS — K754 Autoimmune hepatitis: Secondary | ICD-10-CM | POA: Diagnosis not present

## 2024-04-27 DIAGNOSIS — F419 Anxiety disorder, unspecified: Secondary | ICD-10-CM | POA: Diagnosis not present

## 2024-04-27 DIAGNOSIS — E782 Mixed hyperlipidemia: Secondary | ICD-10-CM | POA: Diagnosis not present

## 2024-04-27 DIAGNOSIS — K219 Gastro-esophageal reflux disease without esophagitis: Secondary | ICD-10-CM | POA: Diagnosis not present

## 2024-04-30 DIAGNOSIS — E1140 Type 2 diabetes mellitus with diabetic neuropathy, unspecified: Secondary | ICD-10-CM | POA: Diagnosis not present

## 2024-04-30 DIAGNOSIS — E782 Mixed hyperlipidemia: Secondary | ICD-10-CM | POA: Diagnosis not present

## 2024-04-30 DIAGNOSIS — I10 Essential (primary) hypertension: Secondary | ICD-10-CM | POA: Diagnosis not present

## 2024-04-30 DIAGNOSIS — K219 Gastro-esophageal reflux disease without esophagitis: Secondary | ICD-10-CM | POA: Diagnosis not present

## 2024-05-04 NOTE — Progress Notes (Unsigned)
 Cardiology Office Note    Date:  05/05/2024  ID:  Rayjon, Wery 1950/09/07, MRN [id number] Cardiologist: Diannah SHAUNNA Maywood, MD Cardiology APP:  Johnson Laymon CHRISTELLA, PA-C { : History of Present Illness:    Douglas Mason is a 73 y.o. 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  He was last examined by Dr. Mallipeddi in 05/2023 and denied any recent chest pain or dyspnea on exertion at that time. It had been recommended to continue DAPT for 1 year and was recommended to stop Effient  in 02/2024. Given no recent chest pain, was recommended could likely stop Imdur  at the time of his next office visit. He was continued on ASA 81 mg daily, Atorvastatin  80 mg daily, Bisoprolol  5 mg daily, Jardiance  25 mg daily, Losartan  25 mg daily and Spironolactone  12.5 mg daily.  In talking with the patient today, he reports overall feeling well since his last office visit. Reports having an episode of chest pain several weeks ago which resolved with SL NTGx1. Says he was unsure if symptoms were due to acid reflux or a cardiac etiology, therefore he took a nitroglycerin  to be safe. No recurrent symptoms since.  He denies any specific dyspnea on exertion, orthopnea, PND or palpitations.  Studies Reviewed:   EKG: EKG is ordered today and demonstrates:   EKG Interpretation Date/Time:  Wednesday May 05 2024 13:37:53 EDT Ventricular Rate:  70 PR Interval:  214 QRS Duration:  82 QT Interval:  380 QTC Calculation: 410 R Axis:   -23  Text Interpretation: Sinus rhythm with 1st degree A-V block Inferior infarct (cited on or before  02-Mar-2023) No acute ST changes. Confirmed by Johnson Laymon ([id number]) on 05/05/2024 1:40:29 PM       Cardiac Catheterization: 02/2023   Lat 1st Mrg lesion is 50% stenosed.   Mid LAD lesion is 100% stenosed.  Balloon angioplasty was performed using a BALL SAPPHIRE NC24 3.25X18.   Post intervention, there is a 0% residual stenosis.   1st Diag lesion is 80% stenosed.  Balloon angioplasty was performed using a BALLN EMERGE MR 2.0X12.   Post intervention, there is a 20% residual stenosis.   There is severe left ventricular systolic dysfunction.   LV end diastolic pressure is moderately elevated.   The left ventricular ejection fraction is less than 25% by visual estimate.   There is no aortic valve stenosis.   In the absence of any other complications or medical issues, we expect the patient to be ready for discharge from an interventional cardiology perspective on 03/04/2023.   Recommend uninterrupted dual antiplatelet therapy with Aspirin  81mg  daily and Prasugrel  10mg  daily for a minimum of 12 months (ACS-Class I recommendation).   Tortuous right subclavian.  Longer guide needed for the left.  EBU 3.75 was sent for.   Successful PTCA of the acute stent thrombosis in the mid LAD stent.  Successful PTCA of the ostial diagonal branch.   It appears that the patient was unable to afford Brilinta  at the time of hospital discharge.  Another prescription has been sent to his home pharmacy.  They did not have  the medicine in stock and it was going to take them 30 days to get it according to the patient.  He has been without his Brilinta .  Going forward, he will need to be discharged on either clopidogrel or prasugrel  since both options should be less expensive.  We did load him with Brilinta  in the Cath Lab today prior to knowing this history.  Tomorrow, would verify cost and if possible, treat with Effient  60 mg x 1.  After that, he will need Effient  10 mg daily.  He will need aggressive diuresis as well.   40 mg of Lasix  IV was given after the case in the Cath Lab.  He will need medical therapy for LV dysfunction as well.   In addition, he had severe right forearm bruising from the prior catheterization.  There is no hematoma but bruising spreading across the medial forearm.  He will be transferred to the CCU.      Echocardiogram: 05/2023 IMPRESSIONS     1. Left ventricular ejection fraction, by estimation, is 45 to 50%. The  left ventricle has mildly decreased function. The left ventricle  demonstrates regional wall motion abnormalities (see scoring  diagram/findings for description). The left ventricular   internal cavity size was mildly dilated. There is mild concentric left  ventricular hypertrophy. Left ventricular diastolic parameters are  consistent with Grade I diastolic dysfunction (impaired relaxation).   2. Right ventricular systolic function is normal. The right ventricular  size is normal. There is mildly elevated pulmonary artery systolic  pressure. The estimated right ventricular systolic pressure is 39.0 mmHg.   3. The mitral valve is grossly normal. Mild mitral valve regurgitation.   4. The aortic valve is tricuspid. There is mild calcification of the  aortic valve. Aortic valve regurgitation is not visualized. Aortic valve  sclerosis is present, with no evidence of aortic valve stenosis. Aortic  valve mean gradient measures 4.0 mmHg.   5. There is mild dilatation of the ascending aorta, measuring 43 mm.   6. The inferior vena cava is normal in size with greater than 50%  respiratory variability, suggesting right atrial pressure of 3 mmHg.   Comparison(s): Prior images reviewed side by side. LVEF has improved  somewhat, now 45-50% range with wall motion abnormalities. Ascending aorta  dilated at 43 mm.   Physical Exam:   VS:  BP 122/70 (BP Location: Left Arm, Cuff Size: Normal)   Pulse 71   Ht 6' (1.829 m)   Wt 225 lb (102.1 kg)   SpO2 95%   BMI 30.52 kg/m     Wt Readings from Last 3 Encounters:  05/05/24 225 lb (102.1 kg)  03/18/24 230 lb 6.4 oz (104.5 kg)  06/02/23 219 lb (99.3 kg)     GEN: Well nourished, well developed male appearing in no acute distress NECK: No JVD; No carotid bruits CARDIAC: RRR, no murmurs, rubs, gallops RESPIRATORY:  Clear to auscultation without rales, wheezing or rhonchi  ABDOMEN: Appears non-distended. No obvious abdominal masses. EXTREMITIES: No clubbing or cyanosis. No pitting edema.  Distal pedal pulses are 2+ bilaterally.   Assessment and Plan:   1. Coronary artery disease involving native coronary artery of native heart without angina pectoris - He previously underwent DES to the LAD in 02/2023 and had a STEMI later that month due to missing Brilinta  and was found to have stent thrombosis treated with balloon angioplasty of LAD and diagonal branch.  - He reports one episode of chest pain several weeks ago as  outlined above but no recurrence. Reviewed warning signs to monitor for. He has continued on Effient  and we reviewed this could be stopped as discussed at the time of his last office visit given that he is over a year out from stent placement. Continue ASA 81 mg daily, Atorvastatin  80 mg daily, Bisoprolol  5 mg daily and Imdur  30 mg daily (will not stop at this time given recent episode of pain).  2. Heart failure with improved ejection fraction (HFimpEF) (HCC) - His EF was at 30-35% in 02/2023 and had improved to 45-50% by repeat echo in 05/2023. He appears euvolemic by examination today and denies any recent respiratory issues. Will plan for a follow-up echocardiogram for reassessment of his EF and also for reassessment of his ascending aorta. If this is not well-visualized, can obtain a CT. - Continue current medical therapy with Bisoprolol  5 mg daily, Imdur  30 mg daily, Losartan  25 mg daily, Spironolactone  12.5 mg daily and Jardiance  25 mg daily (on this dose for diabetes). Recent labs in 03/2024 showed  creatinine was stable at 1.08 and K+ was at 5.0.   3. Essential hypertension - BP is well-controlled at 122/70 during today's visit. Continue current medical therapy with Bisoprolol  5 mg daily, Imdur  30 mg daily, Losartan  25 mg daily and Spironolactone  12.5 mg daily.  4. Hyperlipidemia LDL goal <70 - Recent labs in 03/2024 showed total cholesterol at 104 and LDL 51. Continue Atorvastatin  80mg  daily.    Signed, Laymon CHRISTELLA Qua, PA-C

## 2024-05-05 ENCOUNTER — Other Ambulatory Visit (HOSPITAL_COMMUNITY): Payer: Self-pay

## 2024-05-05 ENCOUNTER — Ambulatory Visit: Attending: Student | Admitting: Student

## 2024-05-05 ENCOUNTER — Other Ambulatory Visit: Payer: Self-pay

## 2024-05-05 ENCOUNTER — Encounter: Payer: Self-pay | Admitting: Student

## 2024-05-05 VITALS — BP 122/70 | HR 71 | Ht 72.0 in | Wt 225.0 lb

## 2024-05-05 DIAGNOSIS — I5032 Chronic diastolic (congestive) heart failure: Secondary | ICD-10-CM

## 2024-05-05 DIAGNOSIS — E785 Hyperlipidemia, unspecified: Secondary | ICD-10-CM

## 2024-05-05 DIAGNOSIS — I10 Essential (primary) hypertension: Secondary | ICD-10-CM | POA: Diagnosis not present

## 2024-05-05 DIAGNOSIS — I2510 Atherosclerotic heart disease of native coronary artery without angina pectoris: Secondary | ICD-10-CM | POA: Diagnosis not present

## 2024-05-05 MED ORDER — ISOSORBIDE MONONITRATE ER 30 MG PO TB24
30.0000 mg | ORAL_TABLET | Freq: Every day | ORAL | 3 refills | Status: AC
  Filled 2024-05-05: qty 90, 90d supply, fill #0
  Filled 2024-08-23: qty 90, 90d supply, fill #1

## 2024-05-05 NOTE — Patient Instructions (Signed)
 Medication Instructions:  Your physician recommends that you continue on your current medications as directed. Please refer to the Current Medication list given to you today.  Stop Taking Effient   *If you need a refill on your cardiac medications before your next appointment, please call your pharmacy*  Lab Work: NONE   If you have labs (blood work) drawn today and your tests are completely normal, you will receive your results only by: MyChart Message (if you have MyChart) OR A paper copy in the mail If you have any lab test that is abnormal or we need to change your treatment, we will call you to review the results.  Testing/Procedures: Your physician has requested that you have an echocardiogram. Echocardiography is a painless test that uses sound waves to create images of your heart. It provides your doctor with information about the size and shape of your heart and how well your heart's chambers and valves are working. This procedure takes approximately one hour. There are no restrictions for this procedure. Please do NOT wear cologne, perfume, aftershave, or lotions (deodorant is allowed). Please arrive 15 minutes prior to your appointment time.  Please note: We ask at that you not bring children with you during ultrasound (echo/ vascular) testing. Due to room size and safety concerns, children are not allowed in the ultrasound rooms during exams. Our front office staff cannot provide observation of children in our lobby area while testing is being conducted. An adult accompanying a patient to their appointment will only be allowed in the ultrasound room at the discretion of the ultrasound technician under special circumstances. We apologize for any inconvenience.   Follow-Up: At [facility], you and your health needs are our priority.  As part of our continuing mission to provide you with exceptional heart care, our providers are all part of one team.  This team includes your  primary Cardiologist (physician) and Advanced Practice Providers or APPs (Physician Assistants and Nurse Practitioners) who all work together to provide you with the care you need, when you need it.  Your next appointment:   6 month(s)  Provider:   You may see Vishnu P Mallipeddi, MD or one of the following Advanced Practice Providers on your designated Care Team:   [place], PA-C  Scotesia Steen, [place] Olivia Pavy, [place]     We recommend signing up for the patient portal called MyChart.  Sign up information is provided on this After Visit Summary.  MyChart is used to connect with patients for Virtual Visits (Telemedicine).  Patients are able to view lab/test results, encounter notes, upcoming appointments, etc.  Non-urgent messages can be sent to your provider as well.   To learn more about what you can do with MyChart, go to [email or web address].   Other Instructions Thank you for choosing [facility]!

## 2024-05-15 ENCOUNTER — Other Ambulatory Visit (HOSPITAL_COMMUNITY): Payer: Self-pay

## 2024-05-15 ENCOUNTER — Other Ambulatory Visit: Payer: Self-pay | Admitting: Cardiology

## 2024-05-17 ENCOUNTER — Other Ambulatory Visit (HOSPITAL_COMMUNITY): Payer: Self-pay

## 2024-05-17 MED ORDER — SPIRONOLACTONE 25 MG PO TABS
12.5000 mg | ORAL_TABLET | Freq: Every day | ORAL | 3 refills | Status: AC
  Filled 2024-05-17: qty 45, 90d supply, fill #0

## 2024-05-25 ENCOUNTER — Ambulatory Visit (HOSPITAL_COMMUNITY)
Admission: RE | Admit: 2024-05-25 | Discharge: 2024-05-25 | Disposition: A | Discharge status: Home / Self Care | Source: Ambulatory Visit | Attending: Student | Admitting: Student

## 2024-05-25 DIAGNOSIS — F419 Anxiety disorder, unspecified: Secondary | ICD-10-CM | POA: Diagnosis not present

## 2024-05-25 DIAGNOSIS — K219 Gastro-esophageal reflux disease without esophagitis: Secondary | ICD-10-CM | POA: Diagnosis not present

## 2024-05-25 DIAGNOSIS — E1140 Type 2 diabetes mellitus with diabetic neuropathy, unspecified: Secondary | ICD-10-CM | POA: Diagnosis not present

## 2024-05-25 DIAGNOSIS — I2510 Atherosclerotic heart disease of native coronary artery without angina pectoris: Secondary | ICD-10-CM | POA: Diagnosis not present

## 2024-05-25 DIAGNOSIS — M19041 Primary osteoarthritis, right hand: Secondary | ICD-10-CM | POA: Diagnosis not present

## 2024-05-25 DIAGNOSIS — I5020 Unspecified systolic (congestive) heart failure: Secondary | ICD-10-CM | POA: Diagnosis not present

## 2024-05-25 DIAGNOSIS — R251 Tremor, unspecified: Secondary | ICD-10-CM | POA: Diagnosis not present

## 2024-05-25 DIAGNOSIS — I5032 Chronic diastolic (congestive) heart failure: Secondary | ICD-10-CM | POA: Insufficient documentation

## 2024-05-25 DIAGNOSIS — K754 Autoimmune hepatitis: Secondary | ICD-10-CM | POA: Diagnosis not present

## 2024-05-25 DIAGNOSIS — E782 Mixed hyperlipidemia: Secondary | ICD-10-CM | POA: Diagnosis not present

## 2024-05-25 DIAGNOSIS — I10 Essential (primary) hypertension: Secondary | ICD-10-CM | POA: Diagnosis not present

## 2024-05-25 LAB — ECHOCARDIOGRAM COMPLETE
Area-P 1/2: 6.32 cm2
S' Lateral: 3.9 cm

## 2024-05-25 NOTE — Progress Notes (Signed)
*  PRELIMINARY RESULTS* Echocardiogram 2D Echocardiogram has been performed.  Douglas Mason 05/25/2024, 4:14 PM

## 2024-05-26 ENCOUNTER — Ambulatory Visit: Payer: Self-pay | Admitting: Student

## 2024-05-27 ENCOUNTER — Other Ambulatory Visit: Payer: Self-pay | Admitting: *Deleted

## 2024-05-27 ENCOUNTER — Other Ambulatory Visit: Payer: Self-pay

## 2024-05-27 NOTE — Patient Outreach (Signed)
 Complex Care Management   Visit Note  05/27/2024  Name:  Douglas Mason MRN: [id number] DOB: [birth year]  Situation: Referral received for Complex Care Management related to Acute MI and acute kidney injury and hypertension  I obtained verbal consent from Patient.  Visit completed with Patient  on the phone  Douglas Mason informs RN CM that his tremors, bronchitis and diabetes are being managed well at home I am doing better  He confirms a cbg this morning of 153  He voices understanding and will notify Mrs [name] of the future change of RN CM outreach staff for October 2025  He voices appreciation of RN CM services rendered   Background:   Past Medical History:  Diagnosis Date   Bronchitis    CAD (coronary artery disease)    a. s/p cath in 02/2023 with DES to LAD, STEMI later that month due to missing Brilinta  and found to have stent thrombosis treated with balloon angioplasty of LAD and diagonal branch   Diabetes mellitus without complication (HCC)    GERD (gastroesophageal reflux disease)    High cholesterol    Hypertension    Pleurisy    Pneumonia    Sleep apnea    nurse in pre-op observed pt sats drop to 80-82% while sleeping on RA,wake up would go to 87%    Assessment: Patient Reported Symptoms:  Cognitive Cognitive Status: No symptoms reported      Neurological Neurological Review of Symptoms: Numbness, Weakness Neurological Management Strategies: Medication therapy, Routine screening Neurological Self-Management Outcome: 4 (good)  HEENT HEENT Symptoms Reported: No symptoms reported HEENT Management Strategies: Routine screening HEENT Self-Management Outcome: 4 (good)    Cardiovascular Cardiovascular Symptoms Reported: No symptoms reported Does patient have uncontrolled Hypertension?: No Cardiovascular Management Strategies: Medication therapy, Routine screening Cardiovascular Self-Management Outcome: 4 (good)  Respiratory Respiratory Symptoms Reported: No  symptoms reported Respiratory Management Strategies: Medication therapy, Routine screening Respiratory Self-Management Outcome: 4 (good)  Endocrine Endocrine Symptoms Reported: Weakness or fatigue Is patient diabetic?: Yes Is patient checking blood sugars at home?: Yes List most recent blood sugar readings, include date and time of day: 153 this am generally < 150 Endocrine Self-Management Outcome: 4 (good)  Gastrointestinal Gastrointestinal Symptoms Reported: No symptoms reported Gastrointestinal Self-Management Outcome: 4 (good)    Genitourinary Genitourinary Symptoms Reported: No symptoms reported Genitourinary Self-Management Outcome: 4 (good)  Integumentary Integumentary Symptoms Reported: No symptoms reported Skin Management Strategies: Routine screening Skin Self-Management Outcome: 4 (good)  Musculoskeletal Musculoskelatal Symptoms Reviewed: Weakness Other Musculoskeletal Symptoms: strength getting better Musculoskeletal Management Strategies: Medication therapy, Routine screening, Medical device Musculoskeletal Self-Management Outcome: 4 (good) Falls in the past year?: No Number of falls in past year: 1 or less Was there an injury with Fall?: No Fall Risk Category Calculator: 0 Patient Fall Risk Level: Low Fall Risk Patient at Risk for Falls Due to: No Fall Risks Fall risk Follow up: Falls evaluation completed  Psychosocial Psychosocial Symptoms Reported: No symptoms reported Behavioral Management Strategies: Support system, Medication therapy, Coping strategies, Adequate rest Behavioral Health Self-Management Outcome: 4 (good) Major Change/Loss/Stressor/Fears (CP): Medical condition, family, Medical condition, self Techniques to Cope with Loss/Stress/Change: Diversional activities, Medication, Withdraw Quality of Family Relationships: helpful, supportive Do you feel physically threatened by others?: No    05/27/2024    PHQ2-9 Depression Screening   Little interest or  pleasure in doing things Not at all  Feeling down, depressed, or hopeless Several days  PHQ-2 - Total Score 1  Trouble falling or staying asleep, or sleeping  too much    Feeling tired or having little energy    Poor appetite or overeating     Feeling bad about yourself - or that you are a failure or have let yourself or your family down    Trouble concentrating on things, such as reading the newspaper or watching television    Moving or speaking so slowly that other people could have noticed.  Or the opposite - being so fidgety or restless that you have been moving around a lot more than usual    Thoughts that you would be better off dead, or hurting yourself in some way    PHQ2-9 Total Score    If you checked off any problems, how difficult have these problems made it for you to do your work, take care of things at home, or get along with other people    Depression Interventions/Treatment      There were no vitals filed for this visit.  Medications Reviewed Today   Medications were not reviewed in this encounter     Recommendation:   Labs 06/29/24 + 07/05/24 PCP Follow-up  Follow Up Plan:   Telephone follow up appointment date/time:  07/2024 at 11 am with Hendricks Kay Iha L. Ramonita, RN, BSN, CCM [place]  [facility], [facility] RN Care Manager Direct Dial : [phone]  Fax: [phone]

## 2024-05-27 NOTE — Patient Instructions (Signed)
 Visit Information  Thank you for taking time to visit with me today. Please don't hesitate to contact me if I can be of assistance to you before our next scheduled appointment.  Your next care management appointment is by telephone on 07/27/24 at 1100 with Hendricks Her  It has been a pleasure working with you. Keep up the good work! Remember to keep Dr Shona updated on ANY worsening symptoms!  Please call the care guide team at [phone] if you need to cancel, schedule, or reschedule an appointment.   Please call the Suicide and Crisis Lifeline: 988 call the [place]  National Suicide Prevention Lifeline: 224-002-9059 or TTY: 915-636-9018 TTY 343-329-4115) to talk to a trained counselor call 1-800-273-TALK (toll free, 24 hour hotline) call the [facility]: 916-363-6965 call 911 if you are experiencing a Mental Health or Behavioral Health Crisis or need someone to talk to.  Alantis Bethune L. Ramonita, RN, BSN, CCM [place]  [facility], [facility] RN Care Manager Direct Dial : [phone]  Fax: [phone]

## 2024-06-01 DIAGNOSIS — E782 Mixed hyperlipidemia: Secondary | ICD-10-CM | POA: Diagnosis not present

## 2024-06-01 DIAGNOSIS — I5020 Unspecified systolic (congestive) heart failure: Secondary | ICD-10-CM | POA: Diagnosis not present

## 2024-06-01 DIAGNOSIS — I10 Essential (primary) hypertension: Secondary | ICD-10-CM | POA: Diagnosis not present

## 2024-06-01 DIAGNOSIS — E1140 Type 2 diabetes mellitus with diabetic neuropathy, unspecified: Secondary | ICD-10-CM | POA: Diagnosis not present

## 2024-06-09 ENCOUNTER — Encounter: Payer: Self-pay | Admitting: *Deleted

## 2024-06-12 ENCOUNTER — Other Ambulatory Visit: Payer: Self-pay | Admitting: Internal Medicine

## 2024-06-15 ENCOUNTER — Other Ambulatory Visit (HOSPITAL_COMMUNITY): Payer: Self-pay

## 2024-06-15 MED ORDER — EMPAGLIFLOZIN 25 MG PO TABS
25.0000 mg | ORAL_TABLET | Freq: Every day | ORAL | 3 refills | Status: AC
  Filled 2024-06-15: qty 90, 90d supply, fill #0
  Filled 2024-09-11 – 2024-09-20 (×2): qty 90, 90d supply, fill #1

## 2024-06-22 DIAGNOSIS — M19041 Primary osteoarthritis, right hand: Secondary | ICD-10-CM | POA: Diagnosis not present

## 2024-06-22 DIAGNOSIS — I10 Essential (primary) hypertension: Secondary | ICD-10-CM | POA: Diagnosis not present

## 2024-06-22 DIAGNOSIS — K219 Gastro-esophageal reflux disease without esophagitis: Secondary | ICD-10-CM | POA: Diagnosis not present

## 2024-06-22 DIAGNOSIS — I5020 Unspecified systolic (congestive) heart failure: Secondary | ICD-10-CM | POA: Diagnosis not present

## 2024-06-22 DIAGNOSIS — R251 Tremor, unspecified: Secondary | ICD-10-CM | POA: Diagnosis not present

## 2024-06-22 DIAGNOSIS — E782 Mixed hyperlipidemia: Secondary | ICD-10-CM | POA: Diagnosis not present

## 2024-06-22 DIAGNOSIS — E1140 Type 2 diabetes mellitus with diabetic neuropathy, unspecified: Secondary | ICD-10-CM | POA: Diagnosis not present

## 2024-06-22 DIAGNOSIS — K754 Autoimmune hepatitis: Secondary | ICD-10-CM | POA: Diagnosis not present

## 2024-06-22 DIAGNOSIS — I2510 Atherosclerotic heart disease of native coronary artery without angina pectoris: Secondary | ICD-10-CM | POA: Diagnosis not present

## 2024-06-22 DIAGNOSIS — F419 Anxiety disorder, unspecified: Secondary | ICD-10-CM | POA: Diagnosis not present

## 2024-06-28 DIAGNOSIS — I10 Essential (primary) hypertension: Secondary | ICD-10-CM | POA: Diagnosis not present

## 2024-06-28 DIAGNOSIS — Z125 Encounter for screening for malignant neoplasm of prostate: Secondary | ICD-10-CM | POA: Diagnosis not present

## 2024-06-28 DIAGNOSIS — E782 Mixed hyperlipidemia: Secondary | ICD-10-CM | POA: Diagnosis not present

## 2024-07-02 DIAGNOSIS — I5020 Unspecified systolic (congestive) heart failure: Secondary | ICD-10-CM | POA: Diagnosis not present

## 2024-07-02 DIAGNOSIS — I10 Essential (primary) hypertension: Secondary | ICD-10-CM | POA: Diagnosis not present

## 2024-07-02 DIAGNOSIS — E782 Mixed hyperlipidemia: Secondary | ICD-10-CM | POA: Diagnosis not present

## 2024-07-02 DIAGNOSIS — E1140 Type 2 diabetes mellitus with diabetic neuropathy, unspecified: Secondary | ICD-10-CM | POA: Diagnosis not present

## 2024-07-05 ENCOUNTER — Encounter: Payer: Self-pay | Admitting: Internal Medicine

## 2024-07-05 DIAGNOSIS — I10 Essential (primary) hypertension: Secondary | ICD-10-CM | POA: Diagnosis not present

## 2024-07-05 DIAGNOSIS — E1369 Other specified diabetes mellitus with other specified complication: Secondary | ICD-10-CM | POA: Diagnosis not present

## 2024-07-05 DIAGNOSIS — E782 Mixed hyperlipidemia: Secondary | ICD-10-CM | POA: Diagnosis not present

## 2024-07-05 DIAGNOSIS — E1169 Type 2 diabetes mellitus with other specified complication: Secondary | ICD-10-CM | POA: Diagnosis not present

## 2024-07-05 DIAGNOSIS — Z0001 Encounter for general adult medical examination with abnormal findings: Secondary | ICD-10-CM | POA: Diagnosis not present

## 2024-07-05 DIAGNOSIS — K754 Autoimmune hepatitis: Secondary | ICD-10-CM | POA: Diagnosis not present

## 2024-07-05 DIAGNOSIS — I77810 Thoracic aortic ectasia: Secondary | ICD-10-CM | POA: Diagnosis not present

## 2024-07-05 DIAGNOSIS — K219 Gastro-esophageal reflux disease without esophagitis: Secondary | ICD-10-CM | POA: Diagnosis not present

## 2024-07-05 DIAGNOSIS — Z0000 Encounter for general adult medical examination without abnormal findings: Secondary | ICD-10-CM | POA: Diagnosis not present

## 2024-07-05 DIAGNOSIS — I110 Hypertensive heart disease with heart failure: Secondary | ICD-10-CM | POA: Diagnosis not present

## 2024-07-05 DIAGNOSIS — I509 Heart failure, unspecified: Secondary | ICD-10-CM | POA: Diagnosis not present

## 2024-07-05 DIAGNOSIS — I2510 Atherosclerotic heart disease of native coronary artery without angina pectoris: Secondary | ICD-10-CM | POA: Diagnosis not present

## 2024-07-21 DIAGNOSIS — E1140 Type 2 diabetes mellitus with diabetic neuropathy, unspecified: Secondary | ICD-10-CM | POA: Diagnosis not present

## 2024-07-21 DIAGNOSIS — I5020 Unspecified systolic (congestive) heart failure: Secondary | ICD-10-CM | POA: Diagnosis not present

## 2024-07-21 DIAGNOSIS — K219 Gastro-esophageal reflux disease without esophagitis: Secondary | ICD-10-CM | POA: Diagnosis not present

## 2024-07-21 DIAGNOSIS — M19041 Primary osteoarthritis, right hand: Secondary | ICD-10-CM | POA: Diagnosis not present

## 2024-07-21 DIAGNOSIS — R251 Tremor, unspecified: Secondary | ICD-10-CM | POA: Diagnosis not present

## 2024-07-21 DIAGNOSIS — F419 Anxiety disorder, unspecified: Secondary | ICD-10-CM | POA: Diagnosis not present

## 2024-07-21 DIAGNOSIS — I10 Essential (primary) hypertension: Secondary | ICD-10-CM | POA: Diagnosis not present

## 2024-07-21 DIAGNOSIS — I2510 Atherosclerotic heart disease of native coronary artery without angina pectoris: Secondary | ICD-10-CM | POA: Diagnosis not present

## 2024-07-21 DIAGNOSIS — E782 Mixed hyperlipidemia: Secondary | ICD-10-CM | POA: Diagnosis not present

## 2024-07-21 DIAGNOSIS — K754 Autoimmune hepatitis: Secondary | ICD-10-CM | POA: Diagnosis not present

## 2024-07-27 ENCOUNTER — Telehealth: Payer: Self-pay

## 2024-07-27 NOTE — Patient Instructions (Signed)
 Fairy HERO Massingale - I am sorry I was unable to reach you today for our scheduled appointment. I work with Shona, Norleen PEDLAR, MD and am calling to support your healthcare needs. Please contact me at [phone] at your earliest convenience. I look forward to speaking with you soon.   Thank you,  Hendricks Her RN, BSN  [place] [facility] RN Case Manager   Direct Dial [phone]

## 2024-08-01 DIAGNOSIS — I5020 Unspecified systolic (congestive) heart failure: Secondary | ICD-10-CM | POA: Diagnosis not present

## 2024-08-01 DIAGNOSIS — E782 Mixed hyperlipidemia: Secondary | ICD-10-CM | POA: Diagnosis not present

## 2024-08-01 DIAGNOSIS — I10 Essential (primary) hypertension: Secondary | ICD-10-CM | POA: Diagnosis not present

## 2024-08-01 DIAGNOSIS — E1140 Type 2 diabetes mellitus with diabetic neuropathy, unspecified: Secondary | ICD-10-CM | POA: Diagnosis not present

## 2024-08-03 ENCOUNTER — Telehealth: Payer: Self-pay

## 2024-08-03 NOTE — Patient Instructions (Signed)
 Fairy HERO Massingale - I am sorry I was unable to reach you today for our scheduled appointment. I work with Shona, Norleen PEDLAR, MD and am calling to support your healthcare needs. Please contact me at [phone] at your earliest convenience. I look forward to speaking with you soon.   Thank you,  Hendricks Her RN, BSN  [place] [facility] RN Case Manager   Direct Dial [phone]

## 2024-08-09 ENCOUNTER — Telehealth: Payer: Self-pay

## 2024-08-09 NOTE — Patient Instructions (Signed)
 Fairy HERO Baldridge - I have attempted to call you three times but have been unsuccessful in reaching you. I work with Shona, Norleen PEDLAR, MD and am calling to support your healthcare needs. If I can be of assistance to you, please contact me at [phone].     Thank you,  Hendricks Her RN, BSN  [place] [facility] RN Case Manager   Direct Dial -[phone]

## 2024-08-23 ENCOUNTER — Other Ambulatory Visit (HOSPITAL_COMMUNITY): Payer: Self-pay

## 2024-08-24 ENCOUNTER — Other Ambulatory Visit (HOSPITAL_COMMUNITY): Payer: Self-pay

## 2024-08-25 ENCOUNTER — Encounter: Payer: Self-pay | Admitting: Internal Medicine

## 2024-09-11 ENCOUNTER — Other Ambulatory Visit (HOSPITAL_COMMUNITY): Payer: Self-pay

## 2024-09-13 ENCOUNTER — Other Ambulatory Visit (HOSPITAL_COMMUNITY): Payer: Self-pay

## 2024-09-20 ENCOUNTER — Other Ambulatory Visit (HOSPITAL_COMMUNITY): Payer: Self-pay
